# Patient Record
Sex: Female | Born: 1935 | Race: White | Hispanic: No | Smoking: Never smoker
Health system: Southern US, Community
[De-identification: ages and names within clinical notes are randomized; demographics above are authoritative.]

## PROBLEM LIST (undated history)

## (undated) DIAGNOSIS — K589 Irritable bowel syndrome without diarrhea: Secondary | ICD-10-CM

## (undated) DIAGNOSIS — I513 Intracardiac thrombosis, not elsewhere classified: Secondary | ICD-10-CM

## (undated) DIAGNOSIS — E0590 Thyrotoxicosis, unspecified without thyrotoxic crisis or storm: Secondary | ICD-10-CM

## (undated) DIAGNOSIS — I69391 Dysphagia following cerebral infarction: Secondary | ICD-10-CM

## (undated) DIAGNOSIS — E669 Obesity, unspecified: Secondary | ICD-10-CM

## (undated) DIAGNOSIS — K219 Gastro-esophageal reflux disease without esophagitis: Secondary | ICD-10-CM

## (undated) DIAGNOSIS — I82409 Acute embolism and thrombosis of unspecified deep veins of unspecified lower extremity: Secondary | ICD-10-CM

## (undated) DIAGNOSIS — F0390 Unspecified dementia without behavioral disturbance: Secondary | ICD-10-CM

## (undated) DIAGNOSIS — I639 Cerebral infarction, unspecified: Secondary | ICD-10-CM

## (undated) DIAGNOSIS — G4733 Obstructive sleep apnea (adult) (pediatric): Secondary | ICD-10-CM

## (undated) DIAGNOSIS — R4701 Aphasia: Secondary | ICD-10-CM

## (undated) DIAGNOSIS — I5022 Chronic systolic (congestive) heart failure: Secondary | ICD-10-CM

## (undated) DIAGNOSIS — I2510 Atherosclerotic heart disease of native coronary artery without angina pectoris: Secondary | ICD-10-CM

---

## 1997-11-24 ENCOUNTER — Other Ambulatory Visit: Admission: RE | Admit: 1997-11-24 | Discharge: 1997-11-24 | Payer: Self-pay | Admitting: Internal Medicine

## 1998-04-24 ENCOUNTER — Inpatient Hospital Stay (HOSPITAL_COMMUNITY): Admission: EM | Admit: 1998-04-24 | Discharge: 1998-04-24 | Payer: Self-pay | Admitting: Emergency Medicine

## 1998-12-09 ENCOUNTER — Ambulatory Visit (HOSPITAL_COMMUNITY): Admission: RE | Admit: 1998-12-09 | Discharge: 1998-12-09 | Payer: Self-pay | Admitting: *Deleted

## 1998-12-12 ENCOUNTER — Emergency Department (HOSPITAL_COMMUNITY): Admission: EM | Admit: 1998-12-12 | Discharge: 1998-12-12 | Payer: Self-pay | Admitting: *Deleted

## 1998-12-22 ENCOUNTER — Encounter: Payer: Self-pay | Admitting: Gastroenterology

## 1998-12-22 ENCOUNTER — Ambulatory Visit (HOSPITAL_COMMUNITY): Admission: RE | Admit: 1998-12-22 | Discharge: 1998-12-22 | Payer: Self-pay | Admitting: Gastroenterology

## 1998-12-23 ENCOUNTER — Emergency Department (HOSPITAL_COMMUNITY): Admission: EM | Admit: 1998-12-23 | Discharge: 1998-12-24 | Payer: Self-pay | Admitting: Emergency Medicine

## 1999-04-05 ENCOUNTER — Encounter: Payer: Self-pay | Admitting: Gynecology

## 1999-04-05 ENCOUNTER — Encounter: Admission: RE | Admit: 1999-04-05 | Discharge: 1999-04-05 | Payer: Self-pay | Admitting: Gynecology

## 1999-12-07 ENCOUNTER — Emergency Department (HOSPITAL_COMMUNITY): Admission: EM | Admit: 1999-12-07 | Discharge: 1999-12-07 | Payer: Self-pay | Admitting: Emergency Medicine

## 2000-04-05 ENCOUNTER — Encounter: Payer: Self-pay | Admitting: Gynecology

## 2000-04-05 ENCOUNTER — Encounter: Admission: RE | Admit: 2000-04-05 | Discharge: 2000-04-05 | Payer: Self-pay | Admitting: Gynecology

## 2000-09-25 ENCOUNTER — Emergency Department (HOSPITAL_COMMUNITY): Admission: EM | Admit: 2000-09-25 | Discharge: 2000-09-25 | Payer: Self-pay | Admitting: Emergency Medicine

## 2000-09-28 ENCOUNTER — Emergency Department (HOSPITAL_COMMUNITY): Admission: EM | Admit: 2000-09-28 | Discharge: 2000-09-28 | Payer: Self-pay | Admitting: Emergency Medicine

## 2000-12-03 ENCOUNTER — Encounter: Admission: RE | Admit: 2000-12-03 | Discharge: 2000-12-03 | Payer: Self-pay | Admitting: *Deleted

## 2001-01-18 ENCOUNTER — Emergency Department (HOSPITAL_COMMUNITY): Admission: EM | Admit: 2001-01-18 | Discharge: 2001-01-18 | Payer: Self-pay | Admitting: Emergency Medicine

## 2001-05-15 ENCOUNTER — Encounter: Admission: RE | Admit: 2001-05-15 | Discharge: 2001-05-15 | Payer: Self-pay | Admitting: *Deleted

## 2001-05-15 ENCOUNTER — Other Ambulatory Visit: Admission: RE | Admit: 2001-05-15 | Discharge: 2001-05-15 | Payer: Self-pay | Admitting: Gynecology

## 2001-07-07 ENCOUNTER — Inpatient Hospital Stay (HOSPITAL_COMMUNITY): Admission: EM | Admit: 2001-07-07 | Discharge: 2001-07-09 | Payer: Self-pay | Admitting: Emergency Medicine

## 2001-07-07 ENCOUNTER — Encounter: Payer: Self-pay | Admitting: Emergency Medicine

## 2002-01-28 ENCOUNTER — Emergency Department (HOSPITAL_COMMUNITY): Admission: EM | Admit: 2002-01-28 | Discharge: 2002-01-28 | Payer: Self-pay | Admitting: Emergency Medicine

## 2002-01-28 ENCOUNTER — Encounter: Payer: Self-pay | Admitting: Emergency Medicine

## 2002-01-29 ENCOUNTER — Encounter: Payer: Self-pay | Admitting: Gastroenterology

## 2002-01-29 ENCOUNTER — Ambulatory Visit (HOSPITAL_COMMUNITY): Admission: RE | Admit: 2002-01-29 | Discharge: 2002-01-29 | Payer: Self-pay | Admitting: Gastroenterology

## 2002-05-19 ENCOUNTER — Encounter: Payer: Self-pay | Admitting: Internal Medicine

## 2002-05-19 ENCOUNTER — Encounter: Admission: RE | Admit: 2002-05-19 | Discharge: 2002-05-19 | Payer: Self-pay | Admitting: Internal Medicine

## 2002-07-11 ENCOUNTER — Encounter: Admission: RE | Admit: 2002-07-11 | Discharge: 2002-07-11 | Payer: Self-pay | Admitting: Internal Medicine

## 2002-07-11 ENCOUNTER — Encounter: Payer: Self-pay | Admitting: Internal Medicine

## 2003-10-24 ENCOUNTER — Emergency Department (HOSPITAL_COMMUNITY): Admission: EM | Admit: 2003-10-24 | Discharge: 2003-10-24 | Payer: Self-pay | Admitting: Emergency Medicine

## 2003-12-15 ENCOUNTER — Other Ambulatory Visit: Admission: RE | Admit: 2003-12-15 | Discharge: 2003-12-15 | Payer: Self-pay | Admitting: Gynecology

## 2004-02-09 DIAGNOSIS — K2980 Duodenitis without bleeding: Secondary | ICD-10-CM | POA: Insufficient documentation

## 2004-02-12 ENCOUNTER — Ambulatory Visit: Payer: Self-pay | Admitting: Gastroenterology

## 2004-02-15 ENCOUNTER — Ambulatory Visit: Payer: Self-pay | Admitting: Gastroenterology

## 2004-03-07 ENCOUNTER — Ambulatory Visit: Payer: Self-pay | Admitting: Internal Medicine

## 2004-03-08 ENCOUNTER — Ambulatory Visit (HOSPITAL_COMMUNITY): Admission: RE | Admit: 2004-03-08 | Discharge: 2004-03-08 | Payer: Self-pay | Admitting: Internal Medicine

## 2004-03-17 ENCOUNTER — Inpatient Hospital Stay (HOSPITAL_COMMUNITY): Admission: AD | Admit: 2004-03-17 | Discharge: 2004-03-18 | Payer: Self-pay | Admitting: Internal Medicine

## 2004-03-17 ENCOUNTER — Ambulatory Visit: Payer: Self-pay | Admitting: Internal Medicine

## 2004-03-19 ENCOUNTER — Inpatient Hospital Stay (HOSPITAL_COMMUNITY): Admission: EM | Admit: 2004-03-19 | Discharge: 2004-03-23 | Payer: Self-pay | Admitting: Emergency Medicine

## 2004-03-19 ENCOUNTER — Ambulatory Visit: Payer: Self-pay | Admitting: Internal Medicine

## 2004-03-23 ENCOUNTER — Inpatient Hospital Stay: Admission: RE | Admit: 2004-03-23 | Discharge: 2004-03-30 | Payer: Self-pay | Admitting: Internal Medicine

## 2004-03-23 ENCOUNTER — Ambulatory Visit: Payer: Self-pay | Admitting: Internal Medicine

## 2004-03-29 ENCOUNTER — Ambulatory Visit (HOSPITAL_COMMUNITY): Admission: RE | Admit: 2004-03-29 | Discharge: 2004-03-29 | Payer: Self-pay | Admitting: Internal Medicine

## 2004-04-18 ENCOUNTER — Ambulatory Visit: Payer: Self-pay | Admitting: Internal Medicine

## 2004-05-02 ENCOUNTER — Ambulatory Visit: Payer: Self-pay | Admitting: Internal Medicine

## 2004-06-22 ENCOUNTER — Ambulatory Visit: Payer: Self-pay | Admitting: Internal Medicine

## 2004-06-27 ENCOUNTER — Other Ambulatory Visit: Admission: RE | Admit: 2004-06-27 | Discharge: 2004-06-27 | Payer: Self-pay | Admitting: Obstetrics and Gynecology

## 2004-06-30 ENCOUNTER — Ambulatory Visit: Payer: Self-pay | Admitting: Internal Medicine

## 2004-08-24 ENCOUNTER — Ambulatory Visit: Payer: Self-pay | Admitting: Internal Medicine

## 2004-12-28 ENCOUNTER — Ambulatory Visit: Payer: Self-pay | Admitting: Internal Medicine

## 2005-01-03 ENCOUNTER — Other Ambulatory Visit: Admission: RE | Admit: 2005-01-03 | Discharge: 2005-01-03 | Payer: Self-pay | Admitting: Internal Medicine

## 2005-01-03 ENCOUNTER — Ambulatory Visit: Payer: Self-pay | Admitting: Internal Medicine

## 2005-01-03 ENCOUNTER — Encounter: Payer: Self-pay | Admitting: Internal Medicine

## 2005-01-16 ENCOUNTER — Ambulatory Visit: Payer: Self-pay | Admitting: Gastroenterology

## 2005-02-15 ENCOUNTER — Emergency Department (HOSPITAL_COMMUNITY): Admission: EM | Admit: 2005-02-15 | Discharge: 2005-02-15 | Payer: Self-pay | Admitting: Emergency Medicine

## 2005-02-16 ENCOUNTER — Ambulatory Visit: Payer: Self-pay | Admitting: Internal Medicine

## 2005-03-20 ENCOUNTER — Ambulatory Visit: Payer: Self-pay | Admitting: Internal Medicine

## 2005-08-25 ENCOUNTER — Ambulatory Visit: Payer: Self-pay | Admitting: Internal Medicine

## 2005-11-10 ENCOUNTER — Ambulatory Visit: Payer: Self-pay | Admitting: Internal Medicine

## 2005-11-28 ENCOUNTER — Ambulatory Visit: Payer: Self-pay | Admitting: Internal Medicine

## 2005-12-21 ENCOUNTER — Emergency Department (HOSPITAL_COMMUNITY): Admission: EM | Admit: 2005-12-21 | Discharge: 2005-12-21 | Payer: Self-pay | Admitting: Emergency Medicine

## 2006-02-19 ENCOUNTER — Emergency Department (HOSPITAL_COMMUNITY): Admission: EM | Admit: 2006-02-19 | Discharge: 2006-02-19 | Payer: Self-pay | Admitting: Emergency Medicine

## 2006-02-27 ENCOUNTER — Ambulatory Visit: Payer: Self-pay | Admitting: Internal Medicine

## 2006-02-27 LAB — CONVERTED CEMR LAB
Bacteria, U Microscopic: NEGATIVE /hpf
Bilirubin Urine: NEGATIVE
Cortisol, Plasma: 11.7 ug/dL
Hemoglobin, Urine: NEGATIVE
Ketones, ur: NEGATIVE mg/dL
Nitrite: NEGATIVE
Specific Gravity, Urine: 1.025 (ref 1.000–1.03)
Urobilinogen, UA: 0.2 (ref 0.0–1.0)
Vitamin B-12: 196 pg/mL — ABNORMAL LOW (ref 211–911)
pH: 6 (ref 5.0–8.0)

## 2006-03-29 ENCOUNTER — Ambulatory Visit: Payer: Self-pay | Admitting: Internal Medicine

## 2006-03-29 LAB — CONVERTED CEMR LAB
Bilirubin Urine: NEGATIVE
Hemoglobin, Urine: NEGATIVE
Ketones, ur: NEGATIVE mg/dL
Mucus, UA: NEGATIVE
Nitrite: NEGATIVE
Total Protein, Urine: NEGATIVE mg/dL
Urine Glucose: NEGATIVE mg/dL
Urobilinogen, UA: 0.2 (ref 0.0–1.0)

## 2006-04-27 ENCOUNTER — Ambulatory Visit: Payer: Self-pay | Admitting: Internal Medicine

## 2006-04-27 LAB — CONVERTED CEMR LAB
BUN: 14 mg/dL (ref 6–23)
Bilirubin Urine: NEGATIVE
Creatinine, Ser: 0.9 mg/dL (ref 0.4–1.2)
Specific Gravity, Urine: 1.025 (ref 1.000–1.03)
Total CHOL/HDL Ratio: 4.2
VLDL: 26 mg/dL (ref 0–40)
pH: 6 (ref 5.0–8.0)

## 2006-05-03 ENCOUNTER — Ambulatory Visit: Payer: Self-pay | Admitting: Internal Medicine

## 2006-05-03 LAB — CONVERTED CEMR LAB
Bacteria, UA: NEGATIVE
Bilirubin Urine: NEGATIVE
Specific Gravity, Urine: >=1.03 (ref 1.000–1.03)
Total Protein, Urine: NEGATIVE mg/dL
Urine Glucose: NEGATIVE mg/dL
Urobilinogen, UA: 0.2 (ref 0.0–1.0)
pH: 5.5 (ref 5.0–8.0)

## 2006-05-10 ENCOUNTER — Ambulatory Visit: Payer: Self-pay | Admitting: Internal Medicine

## 2006-05-30 ENCOUNTER — Ambulatory Visit: Payer: Self-pay | Admitting: Internal Medicine

## 2006-05-31 ENCOUNTER — Ambulatory Visit: Payer: Self-pay | Admitting: Internal Medicine

## 2006-07-28 ENCOUNTER — Ambulatory Visit: Payer: Self-pay | Admitting: Family Medicine

## 2006-10-27 ENCOUNTER — Ambulatory Visit: Payer: Self-pay | Admitting: Internal Medicine

## 2006-11-03 ENCOUNTER — Encounter: Payer: Self-pay | Admitting: Internal Medicine

## 2006-11-03 DIAGNOSIS — I959 Hypotension, unspecified: Secondary | ICD-10-CM

## 2006-11-03 DIAGNOSIS — I2510 Atherosclerotic heart disease of native coronary artery without angina pectoris: Secondary | ICD-10-CM | POA: Insufficient documentation

## 2006-11-03 DIAGNOSIS — R269 Unspecified abnormalities of gait and mobility: Secondary | ICD-10-CM

## 2006-11-03 DIAGNOSIS — R413 Other amnesia: Secondary | ICD-10-CM

## 2007-01-30 ENCOUNTER — Ambulatory Visit: Payer: Self-pay | Admitting: Internal Medicine

## 2007-09-28 ENCOUNTER — Ambulatory Visit: Payer: Self-pay | Admitting: Internal Medicine

## 2007-09-28 DIAGNOSIS — N3090 Cystitis, unspecified without hematuria: Secondary | ICD-10-CM | POA: Insufficient documentation

## 2008-01-20 ENCOUNTER — Inpatient Hospital Stay (HOSPITAL_COMMUNITY): Admission: EM | Admit: 2008-01-20 | Discharge: 2008-01-22 | Payer: Self-pay | Admitting: Emergency Medicine

## 2008-01-20 ENCOUNTER — Ambulatory Visit: Payer: Self-pay | Admitting: Internal Medicine

## 2008-02-08 ENCOUNTER — Observation Stay (HOSPITAL_COMMUNITY): Admission: EM | Admit: 2008-02-08 | Discharge: 2008-02-10 | Payer: Self-pay | Admitting: Emergency Medicine

## 2008-02-08 ENCOUNTER — Ambulatory Visit: Payer: Self-pay | Admitting: Cardiology

## 2008-02-10 ENCOUNTER — Encounter: Payer: Self-pay | Admitting: Internal Medicine

## 2008-02-19 ENCOUNTER — Ambulatory Visit: Payer: Self-pay | Admitting: Internal Medicine

## 2008-02-19 DIAGNOSIS — F068 Other specified mental disorders due to known physiological condition: Secondary | ICD-10-CM | POA: Insufficient documentation

## 2008-02-19 DIAGNOSIS — R42 Dizziness and giddiness: Secondary | ICD-10-CM | POA: Insufficient documentation

## 2008-02-19 DIAGNOSIS — F411 Generalized anxiety disorder: Secondary | ICD-10-CM | POA: Insufficient documentation

## 2008-02-24 LAB — CONVERTED CEMR LAB
Bilirubin Urine: NEGATIVE
Crystals: NEGATIVE
Ketones, ur: NEGATIVE mg/dL
Specific Gravity, Urine: 1.015 (ref 1.000–1.03)
Total Protein, Urine: NEGATIVE mg/dL
Urine Glucose: NEGATIVE mg/dL

## 2008-03-23 ENCOUNTER — Ambulatory Visit: Payer: Self-pay | Admitting: Internal Medicine

## 2008-03-25 LAB — CONVERTED CEMR LAB
Ketones, ur: NEGATIVE mg/dL
Mucus, UA: NEGATIVE
Nitrite: NEGATIVE
Specific Gravity, Urine: 1.025 (ref 1.000–1.03)

## 2009-03-01 ENCOUNTER — Telehealth: Payer: Self-pay | Admitting: Internal Medicine

## 2009-03-08 ENCOUNTER — Encounter: Payer: Self-pay | Admitting: Internal Medicine

## 2009-04-29 ENCOUNTER — Emergency Department (HOSPITAL_COMMUNITY): Admission: EM | Admit: 2009-04-29 | Discharge: 2009-04-29 | Payer: Self-pay | Admitting: Emergency Medicine

## 2009-09-23 ENCOUNTER — Encounter (HOSPITAL_COMMUNITY): Admission: RE | Admit: 2009-09-23 | Discharge: 2009-09-24 | Payer: Self-pay | Admitting: Endocrinology

## 2010-05-13 ENCOUNTER — Other Ambulatory Visit (HOSPITAL_COMMUNITY): Payer: Self-pay | Admitting: Endocrinology

## 2010-05-13 DIAGNOSIS — E0590 Thyrotoxicosis, unspecified without thyrotoxic crisis or storm: Secondary | ICD-10-CM

## 2010-06-06 ENCOUNTER — Inpatient Hospital Stay (HOSPITAL_COMMUNITY): Admission: RE | Admit: 2010-06-06 | Payer: Self-pay | Source: Ambulatory Visit

## 2010-06-07 ENCOUNTER — Encounter (HOSPITAL_COMMUNITY)
Admission: RE | Admit: 2010-06-07 | Discharge: 2010-06-07 | Disposition: A | Discharge status: Home / Self Care | Payer: MEDICARE | Source: Ambulatory Visit | Attending: Endocrinology | Admitting: Endocrinology

## 2010-06-07 ENCOUNTER — Encounter (HOSPITAL_COMMUNITY): Payer: Self-pay

## 2010-06-07 DIAGNOSIS — E0590 Thyrotoxicosis, unspecified without thyrotoxic crisis or storm: Secondary | ICD-10-CM | POA: Insufficient documentation

## 2010-06-08 ENCOUNTER — Ambulatory Visit (HOSPITAL_COMMUNITY)
Admission: RE | Admit: 2010-06-08 | Discharge: 2010-06-08 | Disposition: A | Discharge status: Home / Self Care | Payer: Self-pay | Source: Ambulatory Visit | Attending: Endocrinology | Admitting: Endocrinology

## 2010-06-08 ENCOUNTER — Encounter (HOSPITAL_COMMUNITY): Payer: Self-pay

## 2010-06-08 DIAGNOSIS — E0590 Thyrotoxicosis, unspecified without thyrotoxic crisis or storm: Secondary | ICD-10-CM | POA: Insufficient documentation

## 2010-06-08 HISTORY — DX: Thyrotoxicosis, unspecified without thyrotoxic crisis or storm: E05.90

## 2010-06-08 MED ORDER — SODIUM IODIDE I 131 CAPSULE
4.5000 | Freq: Once | INTRAVENOUS | Status: AC | PRN
  Administered 2010-06-07: 4.5 via ORAL

## 2010-06-10 ENCOUNTER — Other Ambulatory Visit (HOSPITAL_COMMUNITY): Payer: Self-pay | Admitting: Endocrinology

## 2010-06-10 DIAGNOSIS — E0590 Thyrotoxicosis, unspecified without thyrotoxic crisis or storm: Secondary | ICD-10-CM

## 2010-06-14 ENCOUNTER — Emergency Department (HOSPITAL_COMMUNITY)
Admission: EM | Admit: 2010-06-14 | Discharge: 2010-06-14 | Discharge status: Against Medical Advice | Payer: Medicare Other | Attending: Emergency Medicine | Admitting: Emergency Medicine

## 2010-06-14 DIAGNOSIS — R109 Unspecified abdominal pain: Secondary | ICD-10-CM | POA: Insufficient documentation

## 2010-06-22 ENCOUNTER — Inpatient Hospital Stay (HOSPITAL_COMMUNITY): Admission: RE | Admit: 2010-06-22 | Payer: Self-pay | Source: Ambulatory Visit

## 2010-06-22 ENCOUNTER — Ambulatory Visit (HOSPITAL_COMMUNITY): Payer: Self-pay

## 2010-06-23 ENCOUNTER — Other Ambulatory Visit (HOSPITAL_COMMUNITY): Payer: Self-pay

## 2010-06-26 LAB — URINALYSIS, ROUTINE W REFLEX MICROSCOPIC
Bilirubin Urine: NEGATIVE
Glucose, UA: 100 mg/dL — AB
Ketones, ur: 40 mg/dL — AB
Leukocytes, UA: NEGATIVE
Nitrite: NEGATIVE
Protein, ur: NEGATIVE mg/dL
Specific Gravity, Urine: 1.028 (ref 1.005–1.030)
Urobilinogen, UA: 0.2 mg/dL (ref 0.0–1.0)
pH: 5.5 (ref 5.0–8.0)

## 2010-06-26 LAB — BASIC METABOLIC PANEL
BUN: 21 mg/dL (ref 6–23)
Calcium: 9 mg/dL (ref 8.4–10.5)
Creatinine, Ser: 0.76 mg/dL (ref 0.4–1.2)
GFR calc Af Amer: >60 mL/min (ref >60)
GFR calc non Af Amer: >60 mL/min (ref >60)
Glucose, Bld: 173 mg/dL — ABNORMAL HIGH (ref 70–99)
Potassium: 3.9 mEq/L (ref 3.5–5.1)
Sodium: 137 mEq/L (ref 135–145)

## 2010-06-26 LAB — CBC
HCT: 37.2 % (ref 36.0–46.0)
Hemoglobin: 12.3 g/dL (ref 12.0–15.0)
MCHC: 33.1 g/dL (ref 30.0–36.0)
MCV: 85.5 fL (ref 78.0–100.0)
Platelets: 207 10*3/uL (ref 150–400)
RBC: 4.35 MIL/uL (ref 3.87–5.11)
RDW: 13.7 % (ref 11.5–15.5)
WBC: 5.9 10*3/uL (ref 4.0–10.5)

## 2010-06-26 LAB — DIFFERENTIAL
Basophils Absolute: 0 10*3/uL (ref 0.0–0.1)
Basophils Relative: 0 % (ref 0–1)
Eosinophils Absolute: 0.1 10*3/uL (ref 0.0–0.7)
Eosinophils Relative: 1 % (ref 0–5)
Lymphocytes Relative: 22 % (ref 12–46)
Lymphs Abs: 1.3 10*3/uL (ref 0.7–4.0)
Monocytes Absolute: 0.3 K/uL (ref 0.1–1.0)
Monocytes Relative: 5 % (ref 3–12)
Neutro Abs: 4.2 10*3/uL (ref 1.7–7.7)
Neutrophils Relative %: 72 % (ref 43–77)

## 2010-06-26 LAB — TROPONIN I: Troponin I: 0.04 ng/mL (ref 0.00–0.06)

## 2010-06-26 LAB — CK TOTAL AND CKMB (NOT AT ARMC)
CK, MB: 0.9 ng/mL (ref 0.3–4.0)
Relative Index: INVALID (ref 0.0–2.5)
Total CK: 62 U/L (ref 7–177)

## 2010-06-26 LAB — BASIC METABOLIC PANEL WITH GFR
CO2: 24 meq/L (ref 19–32)
Chloride: 103 meq/L (ref 96–112)

## 2010-06-26 LAB — URINE MICROSCOPIC-ADD ON

## 2010-06-26 LAB — POCT CARDIAC MARKERS: Myoglobin, poc: 81.5 ng/mL (ref 12–200)

## 2010-08-12 ENCOUNTER — Encounter (HOSPITAL_COMMUNITY)
Admission: RE | Admit: 2010-08-12 | Discharge: 2010-08-12 | Disposition: A | Discharge status: Home / Self Care | Payer: Medicare Other | Source: Ambulatory Visit | Attending: Endocrinology | Admitting: Endocrinology

## 2010-08-12 DIAGNOSIS — E0590 Thyrotoxicosis, unspecified without thyrotoxic crisis or storm: Secondary | ICD-10-CM

## 2010-08-12 MED ORDER — SODIUM IODIDE I 131 CAPSULE
19.7000 | Freq: Once | INTRAVENOUS | Status: AC | PRN
  Administered 2010-08-12: 19.7 via ORAL

## 2010-08-23 NOTE — H&P (Signed)
Jenny Brady, Jenny Brady               ACCOUNT NO.:  [id number]   MEDICAL RECORD NO.:  [id number]          PATIENT TYPE:  OBV   LOCATION:  1412                         FACILITY:  [facility]   PHYSICIAN:  Darryl D. Prime, MD    DATE OF BIRTH:  1925-04-15   DATE OF ADMISSION:  02/08/2008  DATE OF DISCHARGE:                              HISTORY & PHYSICAL   CODE STATUS:  Full code.   PRIMARY CARE PHYSICIAN:  Georgina Quint. Plotnikov, MD   CHIEF COMPLAINT:  The patient was the historian.  She is a good  historian.  Her chief complaint is dizziness.   HISTORY OF PRESENT ILLNESS:  Jenny Brady is an 77 year old female with  a history of chronic gait disorder, history of vertigo, likely benign  positional vertigo, history of mild dementia, history of hypertension,  history of possible coronary artery disease who presents with dizziness.  She notes she has had intermittent dizzy spells for years and is not  sure of what exacerbates them.  She had one episode tonight and  presented to the emergency room.  She notes that she had mild dizziness  with sitting up still while during the interview.  She denies any chest  pain, shortness of breath or abdominal pain.  She denies any dysuria or  fever.  She denies any black stools or bloody stools.  In the emergency  room she denies any worsening or progression of disease itself.  In the  emergency room she was given aspirin and nitro paste.   PAST MEDICAL AND SURGICAL HISTORY:  As above.  It includes hypertension.  The patient notes she has had stents in the past at [facility] although there are other notes that note she did not have these  stents placed.   MEDICATIONS:  She notes she is taking no medications at this time.  Discharge medicines notes aspirin 81 mg daily and a multivitamin daily.   ALLERGIES:  She is allergic to CONTRAST MEDIA, SULFA, CODEINE, PREVACID.   SOCIAL HISTORY:  No tobacco, alcohol or drug use.   FAMILY HISTORY:  Is  positive for coronary artery disease in the family.   REVIEW OF SYSTEMS:  A 14 point review of systems negative unless stated  above.   PHYSICAL EXAMINATION:  VITAL SIGNS:  Temperature is 97.5 with a pulse of  65, respiratory rate of 18, blood pressure 152/94, sats 98% on room air.  Blood pressure on standing was 116/70 with a pulse of 72 significantly  orthostatic.  HEENT:  Normocephalic, atraumatic.  Pupils equal, round and reactive to  light.  Extraocular movements being intact.  The patient's oropharynx is  dry.  NECK:  Supple with no lymphadenopathy or thyromegaly.  No carotid  bruits.  LUNGS:  Clear to auscultation bilaterally.  CARDIOVASCULAR:  Regular rhythm and rate with no murmurs, rubs or  gallops.  Normal S1 and S2.  No S3 or S4.  ABDOMEN:  Soft, nontender, nondistended with no hepatosplenomegaly.  EXTREMITIES:  Show no clubbing, cyanosis.  She has 2+ lower extremity  edema.  NEUROLOGICAL:  She is alert  and oriented x4 with cranial nerves II-XII  grossly intact.  Strength and sensation grossly intact.   LABS:  Show sodium of 141 with a potassium of 3.6, chloride 106, bicarb  26, BUN 17, creatinine 0.8, glucose of 155.  Otherwise normal LFTs.  The  patient's cardiac markers at 2213 hours were negative.  White count 7.3  with hemoglobin of 13, hematocrit 39.5, platelets 272, segs of 72%, INR  1, PT 13.2.  EKG showed normal sinus rhythm with 69 beats per minute,  left ventricular hypertrophy.  Chest x-ray showed no cardiopulmonary  disease.  CT of the head negative.  On January 18, 2008, CT head again  negative tonight.   ASSESSMENT AND PLAN:  This is a patient with a history of vertigo who  now presents with vertigo.  She describes her dizzy spells as the room  moving around her, worse when going from a sitting to a standing  position, recent admission for similar symptoms, who is also now  orthostatic.  She will be admitted for vertigo, dizziness, rule out  acute  coronary syndrome.  She is likely significantly hypovolemic.  We  will give her IV fluids and will get cardiac markers x3.  She will be on  telemetry and will give her aspirin.  She will be ruled out for acute  coronary syndrome because the ER physician noted that the patient's son  heard a history from the patient of chest pain tonight.      Darryl D. Prime, MD  Electronically Signed     DDP/MEDQ  D:  02/09/2008  T:  02/09/2008  Job:  [id number]

## 2010-08-23 NOTE — Discharge Summary (Signed)
NAMELYLAH, LANTIS               ACCOUNT NO.:  [id number]   MEDICAL RECORD NO.:  [id number]          PATIENT TYPE:  INP   LOCATION:  1442                         FACILITY:  [facility]   PHYSICIAN:  Valerie A. Felicity Coyer, MDDATE OF BIRTH:  1925-12-12   DATE OF ADMISSION:  01/20/2008  DATE OF DISCHARGE:  01/22/2008                               DISCHARGE SUMMARY   PRIMARY CARE PHYSICIAN:  Georgina Quint. Plotnikov, MD   DISCHARGE DIAGNOSES:  1. Vertigo thought benign positional in nature.  2. Mild dementia.   HISTORY OF PRESENT ILLNESS:  Ms. Dembeck is an 77 year old [place] female  with past medical history of mild dementia, chronic gait disorder,  hypertension and CAD who presented to [facility] emergency room on the  day of admission with complaints of world spinning, constant since day  prior to admission.  The patient denied any sinus or ear congestion,  denied any focal weakness.  The patient did report positive history of  similar attacks.  She denied any chest pain, shortness of breath.  No  palpitations, syncope or near syncope.  The patient did have a fall on  the morning of admission en route to the bathroom at which time she was  unable to get up independently therefore called EMS for transfer to the  emergency room.  Upon admission evaluation in emergency room the  patient's symptoms had resolved.  However, the patient felt fearful of  falling again therefore was admitted for further evaluation and  treatment.   PAST MEDICAL HISTORY:  1. Mild dementia.  2. Chronic gait disorder.  3. History of hypertension on no meds prior to arrival.  4. History of CAD with questionable history of stent placement per the      patient.   HOSPITAL COURSE:  Vertigo.  Again, the patient's symptoms were thought  likely secondary to benign positional vertigo.  The patient also with  previous episodes prior to this event.  The patient without any  nystagmus or neuro deficits on exam.  The patient  was admitted overnight  on telemetry without any event.  CT of the head was obtained which was  negative for any acute intracranial findings.  The patient has refused  medical management with Valium and meclizine at this time despite  education otherwise.  The patient was seen in consultation by physical  therapy who felt the patient might benefit from followup with vestibular  rehab at [facility] outpatient clinic.  I have discussed the benefits of  this rehab program in depth with the patient who is agreeable to trying.  At time of dictation the patient denies any vertigo symptoms and again  is still refusing medication despite education otherwise.  The patient  has been instructed to call rehab center to schedule initial exam as she  does not want the appointment scheduled for her at time of discharge.   DISCHARGE MEDICATIONS:  1. Aspirin 81 mg p.o. daily.  2. Multivitamin p.o. daily.  3. The patient with multiple medications listed in the [facility] per primary      care physician.  However, states prior  to this admission only      taking a multivitamin daily.   PERTINENT LAB WORK:  At time of discharge white cell count 4.5, platelet  count 250, hemoglobin 12.5, hematocrit 38.2, sodium 141, potassium 3.8,  BUN 14, creatinine 0.72.   DISPOSITION:  The patient felt medically stable for discharge at this  time as symptoms have resolved and again thought secondary to benign  positional vertigo.  The patient is instructed to follow up with her  primary care physician or establish a new primary care physician in the  next 1-2 weeks post discharge.  In addition, the patient instructed to  call [facility] outpatient rehab center at [phone] to schedule an appointment  for vestibular rehab.      Cordelia Pen, NP      Raenette Rover. Felicity Coyer, MD  Electronically Signed    LE/MEDQ  D:  01/22/2008  T:  01/22/2008  Job:  [id number]   cc:   Georgina Quint. Plotnikov, MD  [address]   [place]  [place] [address]   [facility]

## 2010-08-26 NOTE — Discharge Summary (Signed)
NAMEMADINA, [name]:  [id number]   MEDICAL RECORD NO.:  [id number]          PATIENT TYPE:  ORB   LOCATION:  4530                         FACILITY:  [facility]   PHYSICIAN:  Rene Paci, M.D. LHCDATE OF BIRTH:  1925-08-13   DATE OF ADMISSION:  03/23/2004  DATE OF DISCHARGE:  03/29/2004                                 DISCHARGE SUMMARY   DISCHARGE DIAGNOSES:  1.  Debilitation.  2.  Right calcaneal spurs.  3.  Urinary tract infection.  4.  Functional abdominal pain.   BRIEF ADMISSION HISTORY:  [name] is a 77 year old white female.  She  was initially admitted March 17, 2004 and discharged March 18, 2004 with  an  unremarkable workup for abdominal pain.  She returned on March 19, 2004 stating she was unable to ambulate secondary to lower extremity  weakness.  She also complained of increased falls.  She was readmitted for  further evaluation of her lumbar spine and for PT/OT evaluation.  She had an  MRI of her lumbar spine that showed degenerative disk disease, but was  negative for any nerve rate compression, foraminal stenosis, insufficiency  fractures or compression fractures.  The patient has had an MRI of brain in  November 2005 which was also unremarkable for any cerebrovascular accident.  The patient was evaluated by physical therapy and occupational therapy and  they felt she would benefit from subacute rehab prior to the discharge home.  The patient was transferred to the subacute rehab unit.   PAST MEDICAL HISTORY:  1.  History of coronary artery disease status post prior stent.  2.  Dyslipidemia.  3.  Hypertension.  4.  History of lower extremity DVT after trauma and was on Coumadin in the      past.   HOSPITAL COURSE:  1.  Debilitation.  She is improved to the point she is stable for discharge.  2.  Right foot pain.  The patient did complain of some right foot pain.      Plain films were ordered revealing some right calcaneal  spurs.  3.  GI:  The patient complained of recurrent nausea, vomiting, and abdominal      pain.  The patient had an ongoing outpatient GI evaluation.  We did ask      for GI to see them in the hospital.  The patient was seen by Dr. Juanda Chance      who suspected that this was most likely functional.  She felt she might      benefit from a psychiatric agent versus sedative, versus anti-spasmodic.      An upper GI series was obtained and was unremarkable without reflux.      There was some mild tertiary contractions in the distal esophagus.  She      also had an abdominal ultrasound which was also unremarkable.  Gastric      emptying scan to rule out gastroparesis is still pending.  Currently,      her Celebrex has been discontinued.  She was then started on Librax.      They are  currently considering Zelnorm as well.  4.  ID:  The patient had an abnormal urinalysis on March 26, 2004.      Therefore, we will treat for 7 days with Cipro.   DISCHARGE MEDICATIONS:  1.  Lopressor 25 mg daily.  2.  Aspirin 325 mg daily.  3.  Aciphex 20 mg daily.  4.  Librax one in the morning and then q.8h p.r.n.  5.  Simethicone 80 mg b.i.d.  6.  Cipro 500 mg b.i.d. for 7 days.   Follow up with Dr. Posey Rea Monday,  January 9 at 2:30 p.m.      Laur   LC/MEDQ  D:  03/29/2004  T:  03/30/2004  Job:  [id number]   cc:   Georgina Quint. Plotnikov, M.D. [facility]   Teena Irani. Arlyce Dice, M.D.  [address]  [place]  [place] [address]  Fax: [phone]

## 2010-08-26 NOTE — Discharge Summary (Signed)
Jenny Brady, Jenny Brady               ACCOUNT NO.:  [id number]   MEDICAL RECORD NO.:  [id number]          PATIENT TYPE:  INP   LOCATION:  3006                         FACILITY:  [facility]   PHYSICIAN:  Rene Paci, M.D. LHCDATE OF BIRTH:  Aug 10, 1925   DATE OF ADMISSION:  03/19/2004  DATE OF DISCHARGE:  03/23/2004                                 DISCHARGE SUMMARY   DISCHARGE DIAGNOSES:  1.  Weakness and debilitation.  2.  Lower extremity weakness.  3.  Chest wall pain.  4.  Costochondritis.  5.  Lumbar disc disease.   HISTORY OF PRESENT ILLNESS:  Jenny Brady is a 77 year old, white female who  was admitted on March 19, 2004.  She had been initially hospitalized on  March 17, 2004.  At that time, she was admitted with abdominal pain and  discomfort with a question of diverticulitis versus neuropathy versus  pyelonephritis.  Her workup including CBC, CMET, lipase, sedimentation rate,  CT of the abdomen and pelvis were unremarkable.  The patient was felt to be  stable for discharge home and she was discharged on March 18, 2004.  Despite her unremarkable workup, the patient was uncomfortable during the  night.  She stated she was too weak to sit up or get out of bed.  She  continued to have pain that she described initiated in her abdomen and  radiating around to her back.  The patient also states she has had  difficulty moving her legs and has not had increased falls.  It was noted  that Dr. Posey Rea had ordered an MRI of the brain in November 2005, which  showed small vessel disease, but no other abnormalities.  The patient was  admitted for further evaluation.   PAST MEDICAL HISTORY:  1.  Coronary artery disease with a history of stent placement.  2.  Hypertension.  3.  Gastroesophageal reflux disease.   HOSPITAL COURSE:  Problem 1.  WEAKNESS WITH FALLS ASSOCIATED WITH LOWER  EXTREMITY WEAKNESS:  This prompted an MRI of her lumbar spine.  This showed  some degenerative  disc disease, however, it was negative for any nerve root  compression, foraminal stenosis, insufficiency fractures or compression  fractures.  There was nothing to account for the lower extremity weakness.  The patient was evaluated by PT and OT.  They both felt that she would  benefit from subacute rehabilitation prior to being discharged home.  The  patient was reluctant, but agreeable to subacute rehabilitation.   Problem 2.  CARDIOVASCULAR:  As noted, the patient has a history of coronary  disease and a remote history of stent.  The patient did have some complaints  of chest pain during this admission.  Serial cardiac enzymes were negative  for ischemia.  EKG was also negative for ischemia.  The patient did,  however, have some reproducible chest pain to palpation.  We suspect she may  have some underlying chest wall pain or costochondritis, although her  sedimentation rate was normal.  She did receive one dose of Solu-Medrol with  some improvement in her pain and she has also been  started on some Celebrex  for now.   Problem 3.  HYPERTENSION:  This has remained stable.   Problem 4.  GASTROINTESTINAL:  The patient has a history of gastroesophageal  reflux disease.  As noted, she had a CT of the abdomen and pelvis on  March 18, 2004, that was unremarkable.   DISPOSITION:  The patient will be transferred to the subacute rehabilitation  unit.   DISCHARGE LABORATORY DATA AND X-RAY FINDINGS:  A 24-hour urine for heavy  metals is still pending.  LFTs are normal.  Potassium was 3.4 on admission,  BUN 14, creatinine 0.8.  CBC was normal.  B12 308.  RPR nonreactive.   DISCHARGE MEDICATIONS:  1.  Lopressor 25 mg q.d.  2.  Aspirin 325 mg q.d.  3.  Potassium 10 mEq twice a day.  4.  Protonix 40 mg b.i.d.  5.  Celebrex 100 mg b.i.d.  6.  Restoril 15 mg h.s. p.r.n.  7.  Ultram 50-100 mg q.6h. p.r.n.   MEDICATIONS ON ADMISSION:  1.  Metoprolol 25 mg q.d.  2.  Aciphex 20 mg q.d.    FOLLOW UP:  She will follow up with Dr. Posey Rea on discharge from subacute  rehabilitation.      Laur   LC/MEDQ  D:  03/23/2004  T:  03/23/2004  Job:  [id number]   cc:   Georgina Quint. Plotnikov, M.D. [facility]

## 2010-08-26 NOTE — H&P (Signed)
NAMEMAEBY, VANKLEECK               ACCOUNT NO.:  [id number]   MEDICAL RECORD NO.:  [id number]          PATIENT TYPE:  INP   LOCATION:                               FACILITY:  [facility]   PHYSICIAN:  Georgina Quint. Plotnikov, M.D. Romualdo Bolk OF BIRTH:  [birth year]   DATE OF ADMISSION:  03/17/2004  DATE OF DISCHARGE:                                HISTORY & PHYSICAL   CHIEF COMPLAINT:  Severe pain in the right flank going into the right side  of the abdomen, pain in the left abdomen, difficulty walking, diarrhea.   HISTORY OF PRESENT ILLNESS:  The patient is a 77 year old female who  presents with the above problem of one week duration that has been getting  progressively worse.  She was worked into my schedule today.  She says that  the pain originates in the back, mostly right flank, goes around.  It is  worse with movement, meals.  She slept off and on last night.  Rates pain 10  out of 10 in intensity.  She dose have some pain on the left side as well  that goes around the back.  No nausea or vomiting.  Several loose stools  without blood or mucus.   PAST MEDICAL HISTORY:  1.  Coronary artery disease, history of stent placement.  2.  Hypertension.   ALLERGIES:  IV CONTRAST (she was able to tolerate IV contrast with  premedication with Benadryl in the past), SEPTRA, and CODEINE.   CURRENT MEDICATIONS:  1.  Lipitor that she is not taking.  2.  Metoprolol 25 mg q.d.  3.  AcipHex 20 mg q.d.   SOCIAL HISTORY:  She does not smoke and drinks.  She is married.   FAMILY HISTORY:  Both parents are deceased.  There was coronary artery  disease in the family.   REVIEW OF SYMPTOMS:  As above.  Some nausea without vomiting.  No skin rash.  No chest pain or shortness of breath.  No syncope and no neurologic  complaints.  The rest is negative or as above.   PHYSICAL EXAMINATION:  VITAL SIGNS:  Blood pressure 142/100, pulse 76,  temperature 97.8.  Weight 179 pounds.  GENERAL:  She is in no acute  distress.  She is anxious.  HEENT:  Moist mucosa.  Pupils reactive.  NECK:  Supple.  No meningeal signs.  LUNGS:  Clear.  No wheezes or rales.  HEART:  S1 and S2.  No gallop and no murmur.  MUSCULOSKELETAL:  Lumbosacral spine without deformities.  Tender in the  right costovertebral angle.  ABDOMEN:  Nondistended and soft.  Very tender to palpation, mostly on the  right side.  No rebound symptoms.  No masses felt.  It is also tender in the  left lower quadrant.  Straight leg elevation is negative.  No muscle  weakness.  No rash.  NEUROLOGICAL:  She is alert, oriented, cooperative.  She is anxious.   LABORATORY DATA:  Not available.   ASSESSMENT/PLAN:  1.  Mostly right-sided, right flank, abdominal pain severe and worsening      over the past  several days.  I am not sure of the exact etiology.      Differential diagnoses will include appendicitis, diverticulitis,      nephrolithiasis, pyelonephritis, shingles, radiculopathy, and others.      She may need a gastroenterology consult.  We will obtain CT of the      abdomen and pelvis with intravenous contrast.  Start intravenous fluids,      empiric Cipro 500 mg intravenously b.i.d., morphine p.r.n. pain, and      Darvocet p.r.n. pain.  Clear liquids.  Progress as tolerated.  2.  Diarrhea:  We will use Lomotil p.r.n.  3.  Previous history of allergy to intravenous contrast:  She was able to      tolerate in the past with Benadryl.  We will give her Benadryl 25 mg      prior.  We will also premedicate with Solu-Medrol 125 mg one hour prior      and six hours after.  Intravenous fluids.  4.  Chronic memory loss:  Just had a MRI of the brain which revealed      possible small vessel brain disease.  No gross abnormalities.  5.  Coronary artery disease, history of stent placement:  She is on therapy      and has not taken Lipitor lately.  6.  Hypertension:  She is on metoprolol.  7.  Gastroesophageal reflux disease:  Her gastroenterology  appointment is      pending.  She is on AcipHex 20 mg daily.        ___________________________________________  Georgina Quint Plotnikov, M.D. [facility]    AVP/MEDQ  D:  03/17/2004  T:  03/17/2004  Job:  [id number]

## 2010-08-26 NOTE — H&P (Signed)
Jenny Brady, Jenny Brady               ACCOUNT NO.:  [id number]   MEDICAL RECORD NO.:  [id number]          PATIENT TYPE:  INP   LOCATION:  1826                         FACILITY:  [facility]   PHYSICIAN:  Rosalyn Gess. Norins, M.D. LHCDATE OF BIRTH:  1925/12/19   DATE OF ADMISSION:  03/19/2004  DATE OF DISCHARGE:                                HISTORY & PHYSICAL   CHIEF COMPLAINT:  Abdominal pain and truncal weakness.   HISTORY OF PRESENT ILLNESS:  Jenny Brady was admitted March 17, 2004,  for abdominal pain and discomfort with question of diverticulitis versus  neuropathy versus pyelonephritis.  Her evaluation was unremarkable with  negative CBC, negative CMET,normal lipase, negative ESR.  She had a CT scan  of the abdomen and pelvis which was unremarkable with no specific or  significant abnormality.  The patient was thought to be stable and was  discharged home on the evening of March 18, 2004.  The patient reported at  home that night she was uncomfortable but slept through the night.  Today  she reports she is too weak to be able to sit up or get up out of bed or  turn over.  She continues to have significant pain that she describes as  being from her back all the way around to her abdomen bilaterally.  Her  weakness is such that she is unable to sit up without assistance and  actually can sit up with 2+ assistance with difficulty and discomfort.  The  patient reports she cannot move her proximal legs.  Of note, the patient  reports over the past several weeks a history of gait disorder with  stumbling, difficulty with step up.  The patient also has had a problem with  increased memory loss.  She was seen by Dr. Posey Rea in the office in late  November, and an MRI of the brain was ordered March 08, 2004, which  showed small vessel disease but no significant abnormalities, acute or  chronic.  The patient is now admitted with truncal pain and weakness with  probable  radiculopathy.   Please see the December 8 admit note for Past Medical History, Family  History, and Social History.   MEDICATIONS:  1.  Aciphex 20 mg daily.  2.  Metoprolol 25 mg daily.   REVIEW OF SYSTEMS:  The patient has had no fever, sweats, chills, or other  constitutional problems, but she has had diarrhea.  CARDIOVASCULAR:  No  complaints.  PULMONARY:  No complaints.  GI:  The patient has diarrhea as  noted.  She does have a history of IBS.   PHYSICAL EXAMINATION:  VITAL SIGNS:  Temperature 98.5, blood pressure  158/90, pulse 72, respiratory rate 24, O2 saturation 95% on room air.  GENERAL:  Obese white female in no acute distress.  HEENT:  Normocephalic and atraumatic and unremarkable.  NECK:  Supple.  CHEST:  Clear with no rales, wheezes, or rhonchi.  CARDIOVASCULAR:  2+ radial pulse, 1+ dorsalis pedis pulses.  Precordium is  quiet.  She had a regular rate and rhythm without murmurs.  ABDOMEN: Obese.  She  had positive bowel sounds in all four quadrants.  She  was diffusely tender with no focal tenderness.  There was no  hepatosplenomegaly, but exam was hindered by her size.  RECTAL/PELVIC:  Exams deferred.  SKIN:  Clear.  NEUROLOGIC:  The patient is awake, alert, oriented to person, place, time,  and context.  Her speech is clear.  Her cognition seems normal.  She is a  good historian.  Cranial nerves II-XII revealed the patient had normal  facial symmetry and muscle movement.  Extraocular muscles were intact.  Pupils equal, round, and reactive to light and accommodation.  Motor  strength:  The patient had normal grip strength.  Proximal and distal upper  extremity strength was very close, normal, and equal.  The patient had 2/6  proximal lower extremity strength, being able to raise her legs against  gravity but could not offer any resistance.  The distal lower extremity was  stronger at 3/6 but cannot maintain straight leg against resistance.  She  cannot keep her leg  flexed against resistance.  The patient's far distal  strength was normal with ankle flexion in both plantar and dorsal extension.  Sensation:  The patient had normal light touch and pinprick to the upper  extremities.  She had significantly decreased light touch to the lower  extremities from the knee down but has a relatively well preserved pinprick.  DTRs were 1+ at patellar tendons, trace to 0 at the Achilles tendon  bilaterally.   LABORATORY AND X-RAY DATA:  Repeat laboratory in the ER revealed a normal  white count at 4900, hemoglobin 14.2 g.  Chemistries were normal with a  Sodium of 144, potassium 3.1, chloride 109, CO2 29, BUN 13, creatinine 0.8,  glucose 14.  Liver functions were normal.   IMPRESSION AND PLAN:  #1.  Neurologic:  The patient is presenting with  truncal and lower extremity weakness and significant pain which radiates  around from her back and involves her whole lower abdomen.  The patient is  unable to sit up without maximum assistance.  She does have bilateral lower  extremity weakness.  She has decreased sensation.  I am concerned for  compressive radiculopathy versus a myopathy of a toxic metabolic nature.  There is no indication of any infection with the absence of fever and normal  sedimentation rate.   PLAN:  1.  Lab work to include a B12 level, heavy metal screen, RPR.  2.  Radiographic studies to include LS spine films and LS spinal MRI.  3.  We will arrange for ENG and nerve conduction studies.  4.  We will have PT/OT evaluate the patient in regard to independence and      ability to return home to independent living.  5.  Will treat her with Celebrex 200 mg daily for pain and Tramadol 50 to      100 mg four times a day as needed.   #2.  Gastrointestinal:  The patient had a recent GI evaluation by Dr. Arlyce Dice  including EGD.  She does have a known history of irritable bowel syndrome  but has not been on medication.  PLAN:  Will continue Aciphex 20  mg daily.   #3.  Cardiovascular:  The patient has had no chest pain or chest discomfort.  For completeness, will get an EKG and also cardiac enzymes x 2.       MEN/MEDQ  D:  03/19/2004  T:  03/19/2004  Job:  [id number]

## 2010-08-26 NOTE — Discharge Summary (Signed)
[name], [name] NO.:  [id number]   MEDICAL RECORD NO.:  [id number]          PATIENT TYPE:  ORB   LOCATION:  4530                         FACILITY:  [facility]   PHYSICIAN:  Rene Paci, M.D. LHCDATE OF BIRTH:  1925/07/16   DATE OF ADMISSION:  03/23/2004  DATE OF DISCHARGE:  03/30/2004                                 DISCHARGE SUMMARY   ADDENDUM:  The patient's discharge was held on March 29, 2004 because the  patient refused to leave until a physician had reviewed with her all of the  tests and studies she had had done.  Of note, this has been done in detail  throughout her hospitalization, however, we did discuss with her the result  of each and every test that she has had done during this admission.  We have  also reviewed with her the comments that the consultants have made as well.  The son also had concerns about the patient being discharged; he was not  convinced that she had met her therapy goals.  He had asked that the  therapist call him to review those goals with him prior to her discharge;  this has been arranged.  Dr. Rene Paci also talked with him at length  regarding each and every test that she had had done here and what the  outcome was.   MEDICATIONS AT DISCHARGE:  1.  Lopressor 25 mg daily.  2.  Aspirin 325 mg daily.  3.  Elavil 50 mg nightly.  4.  Levbid 0.375 mg b.i.d.  5.  Aciphex 20 mg daily.  6.  Cipro 500 mg b.i.d. for 5 days.  7.  Ultram 50 mg 1-2 tabs q.4 h. p.r.n.   FOLLOWUP:  Follow up with Dr. Georgina Quint. Plotnikov, April 19, 2003 at 2:30  p.m.      Laur   LC/MEDQ  D:  03/30/2004  T:  03/31/2004  Job:  [id number]

## 2011-01-09 LAB — DIFFERENTIAL
Basophils Relative: 1
Eosinophils Absolute: 0.1
Eosinophils Relative: 2
Lymphs Abs: 1.3
Monocytes Absolute: 0.5
Monocytes Relative: 6
Neutrophils Relative %: 73

## 2011-01-09 LAB — CBC
HCT: 38.2
Hemoglobin: 12.5
Hemoglobin: 13
MCV: 85.2
RBC: 4.48
RBC: 4.66
RDW: 14
WBC: 4.5

## 2011-01-09 LAB — TROPONIN I: Troponin I: <0.01

## 2011-01-09 LAB — URINALYSIS, ROUTINE W REFLEX MICROSCOPIC
Glucose, UA: NEGATIVE
Hgb urine dipstick: NEGATIVE
Protein, ur: NEGATIVE
Specific Gravity, Urine: 1.014

## 2011-01-09 LAB — POCT I-STAT, CHEM 8
BUN: 15
BUN: 19
Calcium, Ion: 1.23
Chloride: 104
Creatinine, Ser: 0.9
Creatinine, Ser: 1
Glucose, Bld: 115 — ABNORMAL HIGH
Glucose, Bld: 146 — ABNORMAL HIGH
HCT: 38
Hemoglobin: 12.9
Sodium: 142
TCO2: 28
TCO2: 28

## 2011-01-09 LAB — COMPREHENSIVE METABOLIC PANEL
ALT: 13
AST: 19
Albumin: 3.6
Alkaline Phosphatase: 92
Calcium: 9.5
GFR calc Af Amer: >60
Glucose, Bld: 155 — ABNORMAL HIGH
Potassium: 3.6
Sodium: 141
Total Protein: 7.2

## 2011-01-09 LAB — CK TOTAL AND CKMB (NOT AT ARMC)
Relative Index: INVALID
Total CK: 46

## 2011-01-09 LAB — B-NATRIURETIC PEPTIDE (CONVERTED LAB): Pro B Natriuretic peptide (BNP): <30

## 2011-01-09 LAB — BASIC METABOLIC PANEL
CO2: 28
Chloride: 107
Creatinine, Ser: 0.72
GFR calc non Af Amer: >60
Glucose, Bld: 114 — ABNORMAL HIGH
Potassium: 3.8

## 2011-01-09 LAB — POCT CARDIAC MARKERS: Myoglobin, poc: 72.4

## 2011-01-10 LAB — BASIC METABOLIC PANEL
BUN: 12
Chloride: 108
Creatinine, Ser: 0.7
GFR calc Af Amer: >60
GFR calc non Af Amer: >60
Potassium: 3.5

## 2011-01-10 LAB — CARDIAC PANEL(CRET KIN+CKTOT+MB+TROPI)
CK, MB: 0.6
CK, MB: 0.7
Total CK: 39
Troponin I: <0.01
Troponin I: <0.01

## 2011-03-26 ENCOUNTER — Other Ambulatory Visit: Payer: Self-pay

## 2011-03-26 ENCOUNTER — Emergency Department (HOSPITAL_COMMUNITY): Payer: Medicare Other

## 2011-03-26 ENCOUNTER — Encounter (HOSPITAL_COMMUNITY): Payer: Self-pay | Admitting: Emergency Medicine

## 2011-03-26 ENCOUNTER — Inpatient Hospital Stay (HOSPITAL_COMMUNITY)
Admission: EM | Admit: 2011-03-26 | Discharge: 2011-04-06 | DRG: 292 | Disposition: A | Discharge status: Home / Self Care | Payer: Medicare Other | Attending: Internal Medicine | Admitting: Internal Medicine

## 2011-03-26 DIAGNOSIS — F0391 Unspecified dementia with behavioral disturbance: Secondary | ICD-10-CM | POA: Diagnosis present

## 2011-03-26 DIAGNOSIS — I2510 Atherosclerotic heart disease of native coronary artery without angina pectoris: Secondary | ICD-10-CM | POA: Diagnosis present

## 2011-03-26 DIAGNOSIS — R079 Chest pain, unspecified: Secondary | ICD-10-CM

## 2011-03-26 DIAGNOSIS — F03918 Unspecified dementia, unspecified severity, with other behavioral disturbance: Secondary | ICD-10-CM | POA: Diagnosis present

## 2011-03-26 DIAGNOSIS — D649 Anemia, unspecified: Secondary | ICD-10-CM | POA: Diagnosis present

## 2011-03-26 DIAGNOSIS — R42 Dizziness and giddiness: Secondary | ICD-10-CM

## 2011-03-26 DIAGNOSIS — I509 Heart failure, unspecified: Secondary | ICD-10-CM

## 2011-03-26 DIAGNOSIS — K219 Gastro-esophageal reflux disease without esophagitis: Secondary | ICD-10-CM | POA: Diagnosis present

## 2011-03-26 DIAGNOSIS — E0590 Thyrotoxicosis, unspecified without thyrotoxic crisis or storm: Secondary | ICD-10-CM | POA: Diagnosis present

## 2011-03-26 DIAGNOSIS — N039 Chronic nephritic syndrome with unspecified morphologic changes: Secondary | ICD-10-CM | POA: Diagnosis present

## 2011-03-26 DIAGNOSIS — N183 Chronic kidney disease, stage 3 unspecified: Secondary | ICD-10-CM | POA: Diagnosis present

## 2011-03-26 DIAGNOSIS — I5023 Acute on chronic systolic (congestive) heart failure: Principal | ICD-10-CM | POA: Diagnosis present

## 2011-03-26 DIAGNOSIS — R55 Syncope and collapse: Secondary | ICD-10-CM | POA: Diagnosis present

## 2011-03-26 DIAGNOSIS — E876 Hypokalemia: Secondary | ICD-10-CM | POA: Diagnosis not present

## 2011-03-26 DIAGNOSIS — F068 Other specified mental disorders due to known physiological condition: Secondary | ICD-10-CM | POA: Diagnosis present

## 2011-03-26 DIAGNOSIS — D631 Anemia in chronic kidney disease: Secondary | ICD-10-CM | POA: Diagnosis present

## 2011-03-26 HISTORY — DX: Gastro-esophageal reflux disease without esophagitis: K21.9

## 2011-03-26 HISTORY — DX: Atherosclerotic heart disease of native coronary artery without angina pectoris: I25.10

## 2011-03-26 LAB — DIFFERENTIAL
Basophils Absolute: 0 10*3/uL (ref 0.0–0.1)
Basophils Relative: 1 % (ref 0–1)
Monocytes Relative: 7 % (ref 3–12)
Neutro Abs: 3.7 10*3/uL (ref 1.7–7.7)
Neutrophils Relative %: 69 % (ref 43–77)

## 2011-03-26 LAB — D-DIMER, QUANTITATIVE: D-Dimer, Quant: 2.23 ug/mL-FEU — ABNORMAL HIGH (ref 0.00–0.48)

## 2011-03-26 LAB — COMPREHENSIVE METABOLIC PANEL
ALT: 27 U/L (ref 0–35)
AST: 30 U/L (ref 0–37)
Albumin: 3.1 g/dL — ABNORMAL LOW (ref 3.5–5.2)
Alkaline Phosphatase: 116 U/L (ref 39–117)
Chloride: 104 mEq/L (ref 96–112)
Potassium: 3.8 mEq/L (ref 3.5–5.1)
Total Bilirubin: 0.3 mg/dL (ref 0.3–1.2)

## 2011-03-26 LAB — URINALYSIS, ROUTINE W REFLEX MICROSCOPIC
Glucose, UA: NEGATIVE mg/dL
Specific Gravity, Urine: 1.022 (ref 1.005–1.030)

## 2011-03-26 LAB — HEPARIN LEVEL (UNFRACTIONATED): Heparin Unfractionated: <0.1 IU/mL — ABNORMAL LOW (ref 0.30–0.70)

## 2011-03-26 LAB — CARDIAC PANEL(CRET KIN+CKTOT+MB+TROPI)
Relative Index: 3.5 — ABNORMAL HIGH (ref 0.0–2.5)
Troponin I: <0.3 ng/mL (ref <0.30)

## 2011-03-26 LAB — CBC
Hemoglobin: 10.7 g/dL — ABNORMAL LOW (ref 12.0–15.0)
MCHC: 31.3 g/dL (ref 30.0–36.0)

## 2011-03-26 LAB — CK TOTAL AND CKMB (NOT AT ARMC): Relative Index: 3.2 — ABNORMAL HIGH (ref 0.0–2.5)

## 2011-03-26 LAB — PROTIME-INR
INR: 0.98 (ref 0.00–1.49)
Prothrombin Time: 13.2 seconds (ref 11.6–15.2)

## 2011-03-26 LAB — URINE MICROSCOPIC-ADD ON

## 2011-03-26 MED ORDER — ASPIRIN 81 MG PO CHEW
324.0000 mg | CHEWABLE_TABLET | ORAL | Status: AC
  Administered 2011-03-26: 324 mg via ORAL
  Filled 2011-03-26: qty 4

## 2011-03-26 MED ORDER — NITROGLYCERIN 0.4 MG SL SUBL: 0.4000 mg | SUBLINGUAL_TABLET | SUBLINGUAL | Status: DC | PRN

## 2011-03-26 MED ORDER — NITROGLYCERIN 0.4 MG SL SUBL
SUBLINGUAL_TABLET | SUBLINGUAL | Status: AC
  Administered 2011-03-26: 09:00:00
  Filled 2011-03-26: qty 25

## 2011-03-26 MED ORDER — LEVOTHYROXINE SODIUM 112 MCG PO TABS
56.0000 ug | ORAL_TABLET | Freq: Every day | ORAL | Status: DC
  Administered 2011-03-26 – 2011-04-06 (×12): 56 ug via ORAL
  Filled 2011-03-26 (×13): qty 0.5

## 2011-03-26 MED ORDER — TECHNETIUM TO 99M ALBUMIN AGGREGATED
4.6000 | Freq: Once | INTRAVENOUS | Status: AC | PRN
  Administered 2011-03-26: 4.9 via INTRAVENOUS

## 2011-03-26 MED ORDER — HEPARIN SOD (PORCINE) IN D5W 100 UNIT/ML IV SOLN
1050.0000 [IU]/h | INTRAVENOUS | Status: DC
  Administered 2011-03-26: 900 [IU]/h via INTRAVENOUS
  Administered 2011-03-27: 1050 [IU]/h via INTRAVENOUS
  Filled 2011-03-26 (×3): qty 250

## 2011-03-26 MED ORDER — ACETAMINOPHEN 325 MG PO TABS
650.0000 mg | ORAL_TABLET | ORAL | Status: DC | PRN
  Administered 2011-03-28: 650 mg via ORAL
  Administered 2011-03-29: 325 mg via ORAL
  Administered 2011-04-02: 650 mg via ORAL
  Filled 2011-03-26 (×5): qty 2

## 2011-03-26 MED ORDER — HEPARIN BOLUS VIA INFUSION
1000.0000 [IU] | Freq: Once | INTRAVENOUS | Status: AC
  Administered 2011-03-26: 1000 [IU] via INTRAVENOUS
  Filled 2011-03-26: qty 1000

## 2011-03-26 MED ORDER — METHYLPREDNISOLONE SODIUM SUCC 125 MG IJ SOLR
INTRAMUSCULAR | Status: AC
  Filled 2011-03-26: qty 2

## 2011-03-26 MED ORDER — ASPIRIN 300 MG RE SUPP
300.0000 mg | RECTAL | Status: AC
  Filled 2011-03-26: qty 1

## 2011-03-26 MED ORDER — FAMOTIDINE 20 MG PO TABS
20.0000 mg | ORAL_TABLET | Freq: Every day | ORAL | Status: DC
  Administered 2011-03-26 – 2011-04-06 (×12): 20 mg via ORAL
  Filled 2011-03-26 (×13): qty 1

## 2011-03-26 MED ORDER — ASPIRIN 81 MG PO CHEW
324.0000 mg | CHEWABLE_TABLET | Freq: Once | ORAL | Status: AC
  Administered 2011-03-26: 324 mg via ORAL
  Filled 2011-03-26: qty 1

## 2011-03-26 MED ORDER — VITAMIN D3 25 MCG (1000 UNIT) PO TABS
1000.0000 [IU] | ORAL_TABLET | Freq: Every day | ORAL | Status: DC
  Administered 2011-03-26 – 2011-04-06 (×12): 1000 [IU] via ORAL
  Filled 2011-03-26 (×14): qty 1

## 2011-03-26 MED ORDER — MORPHINE SULFATE 2 MG/ML IJ SOLN
INTRAMUSCULAR | Status: AC
  Administered 2011-03-26: 4 mg via INTRAVENOUS
  Filled 2011-03-26: qty 2

## 2011-03-26 MED ORDER — MORPHINE SULFATE 4 MG/ML IJ SOLN: 4.0000 mg | Freq: Once | INTRAMUSCULAR | Status: DC

## 2011-03-26 MED ORDER — HALOPERIDOL LACTATE 5 MG/ML IJ SOLN
2.0000 mg | Freq: Once | INTRAMUSCULAR | Status: AC
  Administered 2011-03-26: 2 mg via INTRAVENOUS
  Filled 2011-03-26: qty 1

## 2011-03-26 MED ORDER — ASPIRIN EC 81 MG PO TBEC
81.0000 mg | DELAYED_RELEASE_TABLET | Freq: Every day | ORAL | Status: DC
  Administered 2011-03-27 – 2011-04-06 (×11): 81 mg via ORAL
  Filled 2011-03-26 (×12): qty 1

## 2011-03-26 MED ORDER — NITROGLYCERIN 0.4 MG SL SUBL
0.4000 mg | SUBLINGUAL_TABLET | SUBLINGUAL | Status: DC | PRN
  Administered 2011-03-26 (×2): 0.4 mg via SUBLINGUAL

## 2011-03-26 MED ORDER — ASPIRIN EC 81 MG PO TBEC
81.0000 mg | DELAYED_RELEASE_TABLET | Freq: Every day | ORAL | Status: DC
  Filled 2011-03-26: qty 1

## 2011-03-26 MED ORDER — CARVEDILOL 3.125 MG PO TABS
3.1250 mg | ORAL_TABLET | Freq: Two times a day (BID) | ORAL | Status: DC
  Administered 2011-03-26 – 2011-03-28 (×3): 3.125 mg via ORAL
  Filled 2011-03-26 (×5): qty 1

## 2011-03-26 MED ORDER — HEPARIN BOLUS VIA INFUSION
4000.0000 [IU] | Freq: Once | INTRAVENOUS | Status: AC
  Administered 2011-03-26: 4000 [IU] via INTRAVENOUS
  Filled 2011-03-26: qty 4000

## 2011-03-26 MED ORDER — ONDANSETRON HCL 4 MG/2ML IJ SOLN: 4.0000 mg | Freq: Four times a day (QID) | INTRAMUSCULAR | Status: DC | PRN

## 2011-03-26 MED ORDER — XENON XE 133 GAS
9.1000 | GAS_FOR_INHALATION | Freq: Once | RESPIRATORY_TRACT | Status: AC | PRN
  Administered 2011-03-26: 10 via RESPIRATORY_TRACT

## 2011-03-26 MED ORDER — MECLIZINE HCL 25 MG PO TABS
25.0000 mg | ORAL_TABLET | Freq: Three times a day (TID) | ORAL | Status: DC | PRN
  Filled 2011-03-26: qty 1

## 2011-03-26 MED ORDER — FUROSEMIDE 10 MG/ML IJ SOLN
40.0000 mg | Freq: Two times a day (BID) | INTRAMUSCULAR | Status: DC
  Administered 2011-03-26 (×3): 40 mg via INTRAVENOUS
  Filled 2011-03-26 (×4): qty 4

## 2011-03-26 NOTE — ED Provider Notes (Signed)
History     CSN: [id number] Arrival date & time: 03/26/2011  8:40 AM   First MD Initiated Contact with Patient 03/26/11 [phone]      No chief complaint on file.   (Consider location/radiation/quality/duration/timing/severity/associated sxs/prior treatment) The history is provided by the patient and a relative (She is a very poor and vague historian).   77 year old female is complaining of chest pain with some radiation to the neck. Pain is generally worse with exertion but history is difficult to obtain. Whenever I attempt to get any information from her, she focuses on saying that she had stents placed in her heart without her permission and that is the cause of all her problems. She also is referring to some thyroid problems. When she is able to be focused on answering questions, eyes she does state that the pain in her chest does radiate to the neck. She has difficulty describing it or giving a number to it. She doesn't seem to be somewhat dyspneic according to her son. There's been no vomiting and no diaphoresis. She has not taken anything for pain because she says that she never takes any medication before going to see a doctor. Her son had tried to give her aspirin but she refused to take it.  Past Medical History  Diagnosis Date  . Hyperthyroidism   . Coronary artery disease   . GERD (gastroesophageal reflux disease)     No past surgical history on file.  History reviewed. No pertinent family history.  History  Substance Use Topics  . Smoking status: Never Smoker   . Smokeless tobacco: Never Used  . Alcohol Use: No    OB History    Grav Para Term Preterm Abortions TAB SAB Ect Mult Living                  Review of Systems  All other systems reviewed and are negative.    Allergies  Hyoscyamine and Ivp dye  Home Medications   Current Outpatient Rx  Name Route Sig Dispense Refill  . ALPRAZOLAM 1 MG PO TABS Oral Take 1 mg by mouth 3 (three) times daily as needed.  For anxiety.     . ASPIRIN EC 81 MG PO TBEC Oral Take 81 mg by mouth daily.      Marland Kitchen VITAMIN D 1000 UNITS PO TABS Oral Take 1,000 Units by mouth daily.      Marland Kitchen VITAMIN B-12 PO Oral Take 1 tablet by mouth daily.      Marland Kitchen LEVOTHYROXINE SODIUM 112 MCG PO TABS Oral Take 56.5 mcg by mouth daily.      Marland Kitchen MECLIZINE HCL 25 MG PO TABS Oral Take 25 mg by mouth 3 (three) times daily as needed. For dizziness.     Marland Kitchen RANITIDINE HCL 150 MG PO TABS Oral Take 150 mg by mouth daily.        BP 156/74  Pulse 106  Temp(Src) 97.4 F (36.3 C) (Oral)  Resp 22  SpO2 95%  Physical Exam  Nursing note and vitals reviewed.  77 year old female who is somewhat anxious but no acute distress. Vital signs significant for mild tachycardia heart rate 106, mild tachypnea respiratory rate of 22 and mild hypertension with blood pressure 136/74. Oxygen saturation is a satisfactory 95%. Head is normocephalic atraumatic. PERRLA, EOMI. Oropharynx is clear. Neck is supple without adenopathy or JVD. Back is nontender without any presacral edema. Lungs have fine, dry rales at the left base. There no wheezes or rhonchi. Heart has  regular rate and rhythm without murmur. There is no chest wall tenderness. Abdomen is soft and moderately tender in the epigastrium without any rebound or guarding. There is no hepatosplenomegaly. Extremities have 2-3+ edema without cyanosis. Skin is warm and moist without rash. Neurologic: Mental status is normal except for anxiety have, cranial nerves are intact, there no focal motor Center deficits. Psychiatric: Other than anxiety, there are no abnormalities of mood or affect.  ED Course  Procedures (including critical care time)  Labs Reviewed - No data to display No results found.  Results for orders placed during the hospital encounter of 03/26/11  CBC      Component Value Range   WBC 5.4  4.0 - 10.5 (K/uL)   RBC 4.21  3.87 - 5.11 (MIL/uL)   Hemoglobin 10.7 (*) 12.0 - 15.0 (g/dL)   HCT 16.1 (*) 09.6 -  46.0 (%)   MCV 81.2  78.0 - 100.0 (fL)   MCH 25.4 (*) 26.0 - 34.0 (pg)   MCHC 31.3  30.0 - 36.0 (g/dL)   RDW 04.5  40.9 - 81.1 (%)   Platelets 277  150 - 400 (K/uL)  DIFFERENTIAL      Component Value Range   Neutrophils Relative 69  43 - 77 (%)   Neutro Abs 3.7  1.7 - 7.7 (K/uL)   Lymphocytes Relative 22  12 - 46 (%)   Lymphs Abs 1.2  0.7 - 4.0 (K/uL)   Monocytes Relative 7  3 - 12 (%)   Monocytes Absolute 0.4  0.1 - 1.0 (K/uL)   Eosinophils Relative 1  0 - 5 (%)   Eosinophils Absolute 0.1  0.0 - 0.7 (K/uL)   Basophils Relative 1  0 - 1 (%)   Basophils Absolute 0.0  0.0 - 0.1 (K/uL)  COMPREHENSIVE METABOLIC PANEL      Component Value Range   Sodium 139  135 - 145 (mEq/L)   Potassium 3.8  3.5 - 5.1 (mEq/L)   Chloride 104  96 - 112 (mEq/L)   CO2 26  19 - 32 (mEq/L)   Glucose, Bld 164 (*) 70 - 99 (mg/dL)   BUN 13  6 - 23 (mg/dL)   Creatinine, Ser 9.14  0.50 - 1.10 (mg/dL)   Calcium 8.8  8.4 - 78.2 (mg/dL)   Total Protein 6.3  6.0 - 8.3 (g/dL)   Albumin 3.1 (*) 3.5 - 5.2 (g/dL)   AST 30  0 - 37 (U/L)   ALT 27  0 - 35 (U/L)   Alkaline Phosphatase 116  39 - 117 (U/L)   Total Bilirubin 0.3  0.3 - 1.2 (mg/dL)   GFR calc non Af Amer 79 (*) >90 (mL/min)   GFR calc Af Amer >90  >90 (mL/min)  MAGNESIUM      Component Value Range   Magnesium 1.8  1.5 - 2.5 (mg/dL)  TROPONIN I      Component Value Range   Troponin I <0.30  <0.30 (ng/mL)  PRO B NATRIURETIC PEPTIDE      Component Value Range   Pro B Natriuretic peptide (BNP) 3751.0 (*) 0 - 450 (pg/mL)  URINALYSIS, ROUTINE W REFLEX MICROSCOPIC      Component Value Range   Color, Urine YELLOW  YELLOW    APPearance CLOUDY (*) CLEAR    Specific Gravity, Urine 1.022  1.005 - 1.030    pH 7.5  5.0 - 8.0    Glucose, UA NEGATIVE  NEGATIVE (mg/dL)   Hgb urine dipstick TRACE (*) NEGATIVE  Bilirubin Urine NEGATIVE  NEGATIVE    Ketones, ur 15 (*) NEGATIVE (mg/dL)   Protein, ur 161 (*) NEGATIVE (mg/dL)   Urobilinogen, UA 1.0  0.0 - 1.0  (mg/dL)   Nitrite NEGATIVE  NEGATIVE    Leukocytes, UA NEGATIVE  NEGATIVE   PROTIME-INR      Component Value Range   Prothrombin Time 13.2  11.6 - 15.2 (seconds)   INR 0.98  0.00 - 1.49   APTT      Component Value Range   aPTT 26  24 - 37 (seconds)  D-DIMER, QUANTITATIVE      Component Value Range   D-Dimer, Quant 2.23 (*) 0.00 - 0.48 (ug/mL-FEU)  URINE MICROSCOPIC-ADD ON      Component Value Range   Squamous Epithelial / LPF RARE  RARE    WBC, UA 0-2  <3 (WBC/hpf)   RBC / HPF 0-2  <3 (RBC/hpf)   Urine-Other LESS THAN 10 mL OF URINE SUBMITTED     Dg Chest Portable 1 View  03/26/2011  *RADIOLOGY REPORT*  Clinical Data: Chest pain.  History of heart stents.  History diabetes.  PORTABLE CHEST - 1 VIEW  Comparison: 02/08/2008  Findings: The heart is enlarged.  There are interstitial changes consistent with pulmonary edema.  More focal opacity the left upper lobe may be representative of confluent edema but infectious infiltrate cannot be excluded.  There may be small bilateral pleural effusions.  Degenerative changes are seen in the spine.  IMPRESSION:  1.  Cardiomegaly and pulmonary edema. 2.  Confluent edema versus infectious infiltrate in the left upper lobe.  Original Report Authenticated By: Patterson Hammersmith, M.D.     No diagnosis found.   Date: 03/26/2011  Rate: 106  Rhythm: sinus tachycardia  QRS Axis: normal  Intervals: QT prolonged  ST/T Wave abnormalities: Slight ST depression in the anterolateral leads and nonspecific T-wave changes with the inverted T wave in V6  Conduction Disutrbances:nonspecific intraventricular conduction delay  Narrative Interpretation: Sinus tachycardia with nonspecific intraventricular conduction delay possibly due to LVH, nonspecific ST and T changes but concerning for ischemia. When compared with ECG 04/29/2009, QRS is wider, ST depression and T-wave inversion are now present, QTC is longer.  Old EKG Reviewed: changes noted  She has gotten  complete relief of chest pain with 2 nitroglycerin. She was placed on a heparin drip to treat probable acute coronary syndrome. She was given aspirin in the emergency department. Her troponin has come back normal but d-dimer is elevated. Because of allergy to IV dye, VQ scan will be obtained. Case is discussed with [facility]ad hospitalists who will come to admit the patient.  CRITICAL CARE Performed by: Dione Booze   Total critical care time: 55 minutes  Critical care time was exclusive of separately billable procedures and treating other patients.  Critical care was necessary to treat or prevent imminent or life-threatening deterioration.  Critical care was time spent personally by me on the following activities: development of treatment plan with patient and/or surrogate as well as nursing, discussions with consultants, evaluation of patient's response to treatment, examination of patient, obtaining history from patient or surrogate, ordering and performing treatments and interventions, ordering and review of laboratory studies, ordering and review of radiographic studies, pulse oximetry and re-evaluation of patient's condition.   MDM  Chest pain-possible acute coronary syndrome.        Dione Booze, MD 03/26/11 1049

## 2011-03-26 NOTE — Progress Notes (Signed)
ANTICOAGULATION CONSULT NOTE - Follow Up Consult  Pharmacy Consult for IV heparin Indication: ACS/STEMI  Allergies  Allergen Reactions  . Hyoscyamine   . Ivp Dye (Iodinated Diagnostic Agents)     Patient Measurements: Height: 5\' 6"  (167.6 cm) Weight: 190 lb 11.2 oz (86.5 kg) IBW/kg (Calculated) : 59.3 Per patient and RN:  Pt wts 160 lbs = 73 kg and is 5'6'' Ideal Body Weight = 60 kg  Vital Signs: Temp: 97.8 F (36.6 C) (12/16 1800) Temp src: Oral (12/16 1800) BP: 125/62 mmHg (12/16 1800) Pulse Rate: 110  (12/16 1800)  Labs:  Basename 03/26/11 1930 03/26/11 1818 03/26/11 0925 03/26/11 0924 03/26/11 0840  HGB -- -- -- -- 10.7*  HCT -- -- -- -- 34.2*  PLT -- -- -- -- 277  APTT -- -- -- -- 26  LABPROT -- -- -- -- 13.2  INR -- -- -- -- 0.98  HEPARINUNFRC 0.28* -- -- <0.10* --  CREATININE -- -- -- -- 0.67  CKTOTAL -- 112 111 -- --  CKMB -- 3.9 3.6 -- --  TROPONINI -- <0.30 <0.30 -- --   Estimated Creatinine Clearance: 59 ml/min (by C-G formula based on Cr of 0.67).  Medical History: Past Medical History  Diagnosis Date  . Hyperthyroidism     had RAI-is on thyroid replacement, happened maybe this summer  . Coronary artery disease     Had a catheterization per son by Dr. [name] in 1991 no reports in  [place]  . GERD (gastroesophageal reflux disease)     Medications:  Scheduled:     . aspirin  324 mg Oral Once  . aspirin  324 mg Oral NOW   Or  . aspirin  300 mg Rectal NOW  . aspirin EC  81 mg Oral Daily  . carvedilol  3.125 mg Oral BID WC  . cholecalciferol  1,000 Units Oral Daily  . famotidine  20 mg Oral Daily  . furosemide  40 mg Intravenous BID  . haloperidol lactate  2 mg Intravenous Once  . heparin  4,000 Units Intravenous Once  . levothyroxine  56 mcg Oral Daily  . methylPREDNISolone sodium succinate      . morphine      .  morphine injection  4 mg Intravenous Once  . nitroGLYCERIN      . DISCONTD: aspirin EC  81 mg Oral Daily   Infusions:       . heparin 900 Units (03/26/11 1800)    Assessment:  85 YOF admitted with CP to start IV heparin for r/o ACS/STEMI  Updated patient weight is 86.5 kg --> heparin dosing weight ~ 78kg  Heparin level subtherapeutic at 0.28.  Goal of Therapy:  Heparin level 0.3-0.7 units/ml   Plan:   Heparin 1000 units IV x 1 as bolus then heparin 1050 units/hr  Heparin level 8 hours following dose change, which is the same time the daily level will be drawn so will f/u at this time.  Daily CBC and heparin level  Clance Boll 03/26/2011,9:09 PM

## 2011-03-26 NOTE — ED Notes (Signed)
Patient transported to X-ray( Nuc Med) with nurse and monitor.

## 2011-03-26 NOTE — Consult Note (Signed)
CARDIOLOGY CONSULT NOTE  Patient ID: Jenny Brady MRN: [id number] DOB/AGE: 1925/07/10 77 y.o.  Admit date: 03/26/2011 Primary Physician "Brownlee Doctor" Primary Cardiologist None Chief Complaint  Syncope  HPI:   The patient presents Apparently for evaluation of dizziness and falls. She came to me no details and seems quite confused. She reiterates many times that she had stents placed in her heart rate she didn't need to want her neck she's had problems since then. She vaguely describe some chest discomfort across her chest. She cannot quantify her quantified. She doesn't describe any palpitations. She has a weakness and dizziness and apparently loses balance. She doesn't describe program motor or neurologic deficits however. There she appears dyspneic on this exam she would denies any shortness of breath, PND or orthopnea.  I didn't see that she has had coronary disease with stenting but I don't see the details. Her last echo was in 2009 with an EF of 60%. I do not see that she seen a cardiologist in some time. In the emergency room she does have an elevated BNP. Paronychia markers are negative. There is some edema chest x-ray and V/Q was normal.  Past Medical History  Diagnosis Date  . Hyperthyroidism     had RAI-is on thyroid replacement  . Coronary artery disease     stents (no data in echart)  . GERD (gastroesophageal reflux disease)     Past Surgical History  Procedure Date  . None     Allergies  Allergen Reactions  . Hyoscyamine   . Ivp Dye (Iodinated Diagnostic Agents)    Prior to Admission medications   Medication Sig      ALPRAZolam (XANAX) 0.5 MG tablet Take 0.5 mg by mouth 3 (three) times daily as needed. For anxiety.       aspirin EC 81 MG tablet Take 81 mg by mouth daily.        cholecalciferol (VITAMIN D) 1000 UNITS tablet Take 1,000 Units by mouth daily.        Cyanocobalamin (VITAMIN B-12 PO) Take 1 tablet by mouth daily.        levothyroxine (SYNTHROID,  LEVOTHROID) 112 MCG tablet Take 56 mcg by mouth daily.       meclizine (ANTIVERT) 25 MG tablet Take 25 mg by mouth 3 (three) times daily as needed. For dizziness.       ranitidine (ZANTAC) 150 MG tablet Take 150 mg by mouth daily as needed. For heartburn.        Family History  Problem Relation Age of Onset           . Dementia Father     died 73           . Cervical cancer Sister     History   Social History  . Marital Status: Widowed    Spouse Name: N/A    Number of Children: N/A  . Years of Education: N/A   Occupational History  . Not on file.   Social History Main Topics  . Smoking status: Never Smoker   . Smokeless tobacco: Never Used  . Alcohol Use: No  . Drug Use: No  . Sexually Active:    Other Topics Concern  . Not on file   Social History Narrative   [phone] Lodema [place], [place]-lives with them     As stated in the HPI and negative for all other systems.  Physical Exam: Blood pressure 140/85, pulse 107, temperature 97.6 F (36.4 C), temperature source  Oral, resp. rate 22, SpO2 96.00%.  GENERAL:  Quite agitated, disheveled HEENT:  Pupils equal round and reactive, fundi not visualized, oral mucosa unremarkable NECK:  No jugular venous distention, waveform within normal limits, carotid upstroke brisk and symmetric, no bruits, no thyromegaly LYMPHATICS:  No cervical, inguinal adenopathy LUNGS:  Bilateral crackles BACK:  No CVA tenderness CHEST:  Unremarkable HEART:  PMI not displaced or sustained,S1 and S2 within normal limits, no S3, positive S4, no clicks, no rubs, no murmurs ABD:  Flat, positive bowel sounds normal in frequency in pitch, no bruits, no rebound, no guarding, no midline pulsatile mass, no hepatomegaly, no splenomegaly EXT:  2 plus pulses throughout, moderate bilateral lower extremity edema, no cyanosis no clubbing SKIN:  No rashes no nodules NEURO:  Cranial nerves II through XII grossly intact, motor grossly intact  throughout PSYCH:  Cognitively intact, oriented to person place and time   Labs: Lab Results  Component Value Date   BUN 13 03/26/2011   Lab Results  Component Value Date   CREATININE 0.67 03/26/2011   Lab Results  Component Value Date   NA 139 03/26/2011   K 3.8 03/26/2011   CL 104 03/26/2011   CO2 26 03/26/2011   Lab Results  Component Value Date   CKTOTAL 111 03/26/2011   CKMB 3.6 03/26/2011   TROPONINI <0.30 03/26/2011   Lab Results  Component Value Date   WBC 5.4 03/26/2011   HGB 10.7* 03/26/2011   HCT 34.2* 03/26/2011   MCV 81.2 03/26/2011   PLT 277 03/26/2011   Lab Results  Component Value Date   CHOL 244* 04/27/2006   HDL 57.5 04/27/2006   LDLDIRECT 165.8 04/27/2006   TRIG 128 04/27/2006   CHOLHDL 4.2 CALC 04/27/2006   Lab Results  Component Value Date   ALT 27 03/26/2011   AST 30 03/26/2011   ALKPHOS 116 03/26/2011   BILITOT 0.3 03/26/2011      Radiology:  VQ - Low probability.  CXR - Cardiomegaly and pulmonary edema.  EKG:  03/26/2011  No EKG done today  ASSESSMENT AND PLAN:   1) CAD:  History this don't have details. She is denying any chest discomfort. At this point I would cycle cardiac enzymes. However, unless there are significant symptoms or enzyme elevations were likely pursue noninvasive evaluation her medical management.  2) CHF:  She has volume overload. She should be gently diuresed. We can check an echocardiogram. Further management will be based on these results.  3) Dizziness:  She continues to mention this. I cannot get from her a history of his syncope. She should have orthostatic blood pressures checked. We will watch her on telemetry and otherwise proceed as above.   SignedRollene Rotunda 03/26/2011, 2:56 PM

## 2011-03-26 NOTE — H&P (Addendum)
Jenny Brady is an 77 y.o. female.     Jenny Peck, MD, MD  CC-Syncope  HPI-77 y/o greek-had some pain in her chest "states put stents in her heart without her approval"  Stents were placed a few years back--thinks 4-5 yr ago.  Has been getting dizzy spells and fell down-unprovoked, and fell on the steps-doesn't recall when, last time was be a week ago.  Cannot eally state any sepcific worsening issue re: dizzy spells, but seems worse with walking Very tangential hisotiran and not able to relate muhc of her medcial issues to me  Spoke with Son-Today stated to son that she had heart, and kidney pain/stomach.  Her glands in her neck were also hurting her.  Had also some sharp pain going up her legs.  Also stated she had a knot in her privates. Breathing issues as well as nothing subsided was brought over When she is out walking seemed to get real tired and would have labored breathing--started maybe 1-2 weeks ago.  Hasn't had these issues before. Legs seem more swollen to her and reported to her son.  No recent h/o travel.  No h/o blood clots in her legs Refused her regular ASA and other meds this am. Seemed to have been coughing/burping more recently per son with many non-specific c/o's   Past Medical History  Diagnosis Date  . Hyperthyroidism     had RAI-is on thyroid replacement, happened maybe this summer  . Coronary artery disease     Had a catheterization per son by Dr. [name] in 1991 no reports in  [place]  . GERD (gastroesophageal reflux disease)     No past surgical history on file.  Family History  Problem Relation Age of Onset  . Acne Mother     died giving birth to 8th kid  . Dementia Father     died 86  . Acne Brother     died 2-3 yrs ago  . Cervical cancer Sister    Social History:  reports that she has never smoked. She has never used smokeless tobacco. She reports that she does not drink alcohol or use illicit drugs.  Allergies:  Allergies    Allergen Reactions  . Hyoscyamine   . Ivp Dye (Iodinated Diagnostic Agents)     Medications Prior to Admission  Medication Dose Route Frequency Provider Last Rate Last Dose  . aspirin chewable tablet 324 mg  324 mg Oral Once Dione Booze, MD   324 mg at 03/26/11 1004  . haloperidol lactate (HALDOL) injection 2 mg  2 mg Intravenous Once Pleas Koch, MD      . heparin bolus via infusion 4,000 Units  4,000 Units Intravenous Once Theda Sers, PHARMD   4,000 Units at 03/26/11 1045   Followed by  . heparin ADULT infusion 100 units/ml (25000 units/250 ml)  900 Units/hr Intravenous Continuous Theda Sers, PHARMD 9 mL/hr at 03/26/11 1043 900 Units/hr at 03/26/11 1043  . nitroGLYCERIN (NITROSTAT) 0.4 MG SL tablet           . nitroGLYCERIN (NITROSTAT) SL tablet 0.4 mg  0.4 mg Sublingual Q5 Min x 3 PRN Dione Booze, MD   0.4 mg at 03/26/11 0915   No current outpatient prescriptions on file as of 03/26/2011.    Results for orders placed during the hospital encounter of 03/26/11 (from the past 48 hour(s))  CBC     Status: Abnormal   Collection Time   03/26/11  8:40 AM  Component Value Range Comment   WBC 5.4  4.0 - 10.5 (K/uL)    RBC 4.21  3.87 - 5.11 (MIL/uL)    Hemoglobin 10.7 (*) 12.0 - 15.0 (g/dL)    HCT 16.1 (*) 09.6 - 46.0 (%)    MCV 81.2  78.0 - 100.0 (fL)    MCH 25.4 (*) 26.0 - 34.0 (pg)    MCHC 31.3  30.0 - 36.0 (g/dL)    RDW 04.5  40.9 - 81.1 (%)    Platelets 277  150 - 400 (K/uL)   DIFFERENTIAL     Status: Normal   Collection Time   03/26/11  8:40 AM      Component Value Range Comment   Neutrophils Relative 69  43 - 77 (%)    Neutro Abs 3.7  1.7 - 7.7 (K/uL)    Lymphocytes Relative 22  12 - 46 (%)    Lymphs Abs 1.2  0.7 - 4.0 (K/uL)    Monocytes Relative 7  3 - 12 (%)    Monocytes Absolute 0.4  0.1 - 1.0 (K/uL)    Eosinophils Relative 1  0 - 5 (%)    Eosinophils Absolute 0.1  0.0 - 0.7 (K/uL)    Basophils Relative 1  0 - 1 (%)    Basophils Absolute 0.0  0.0 -  0.1 (K/uL)   COMPREHENSIVE METABOLIC PANEL     Status: Abnormal   Collection Time   03/26/11  8:40 AM      Component Value Range Comment   Sodium 139  135 - 145 (mEq/L)    Potassium 3.8  3.5 - 5.1 (mEq/L)    Chloride 104  96 - 112 (mEq/L)    CO2 26  19 - 32 (mEq/L)    Glucose, Bld 164 (*) 70 - 99 (mg/dL)    BUN 13  6 - 23 (mg/dL)    Creatinine, Ser 9.14  0.50 - 1.10 (mg/dL)    Calcium 8.8  8.4 - 10.5 (mg/dL)    Total Protein 6.3  6.0 - 8.3 (g/dL)    Albumin 3.1 (*) 3.5 - 5.2 (g/dL)    AST 30  0 - 37 (U/L)    ALT 27  0 - 35 (U/L)    Alkaline Phosphatase 116  39 - 117 (U/L)    Total Bilirubin 0.3  0.3 - 1.2 (mg/dL)    GFR calc non Af Amer 79 (*) >90 (mL/min)    GFR calc Af Amer >90  >90 (mL/min)   MAGNESIUM     Status: Normal   Collection Time   03/26/11  8:40 AM      Component Value Range Comment   Magnesium 1.8  1.5 - 2.5 (mg/dL)   PROTIME-INR     Status: Normal   Collection Time   03/26/11  8:40 AM      Component Value Range Comment   Prothrombin Time 13.2  11.6 - 15.2 (seconds)    INR 0.98  0.00 - 1.49    APTT     Status: Normal   Collection Time   03/26/11  8:40 AM      Component Value Range Comment   aPTT 26  24 - 37 (seconds)   D-DIMER, QUANTITATIVE     Status: Abnormal   Collection Time   03/26/11  8:40 AM      Component Value Range Comment   D-Dimer, Quant 2.23 (*) 0.00 - 0.48 (ug/mL-FEU)   HEPARIN LEVEL (UNFRACTIONATED)     Status: Abnormal  Collection Time   03/26/11  9:24 AM      Component Value Range Comment   Heparin Unfractionated <0.10 (*) 0.30 - 0.70 (IU/mL)   TROPONIN I     Status: Normal   Collection Time   03/26/11  9:25 AM      Component Value Range Comment   Troponin I <0.30  <0.30 (ng/mL)   CK TOTAL AND CKMB     Status: Abnormal   Collection Time   03/26/11  9:25 AM      Component Value Range Comment   Total CK 111  7 - 177 (U/L)    CK, MB 3.6  0.3 - 4.0 (ng/mL)    Relative Index 3.2 (*) 0.0 - 2.5    PRO B NATRIURETIC PEPTIDE      Status: Abnormal   Collection Time   03/26/11  9:29 AM      Component Value Range Comment   Pro B Natriuretic peptide (BNP) 3751.0 (*) 0 - 450 (pg/mL)   URINALYSIS, ROUTINE W REFLEX MICROSCOPIC     Status: Abnormal   Collection Time   03/26/11  9:53 AM      Component Value Range Comment   Color, Urine YELLOW  YELLOW     APPearance CLOUDY (*) CLEAR     Specific Gravity, Urine 1.022  1.005 - 1.030     pH 7.5  5.0 - 8.0     Glucose, UA NEGATIVE  NEGATIVE (mg/dL)    Hgb urine dipstick TRACE (*) NEGATIVE     Bilirubin Urine NEGATIVE  NEGATIVE     Ketones, ur 15 (*) NEGATIVE (mg/dL)    Protein, ur 045 (*) NEGATIVE (mg/dL)    Urobilinogen, UA 1.0  0.0 - 1.0 (mg/dL)    Nitrite NEGATIVE  NEGATIVE     Leukocytes, UA NEGATIVE  NEGATIVE    URINE MICROSCOPIC-ADD ON     Status: Normal   Collection Time   03/26/11  9:53 AM      Component Value Range Comment   Squamous Epithelial / LPF RARE  RARE     WBC, UA 0-2  <3 (WBC/hpf)    RBC / HPF 0-2  <3 (RBC/hpf)    Urine-Other LESS THAN 10 mL OF URINE SUBMITTED   MUCOUS PRESENT   Dg Chest Portable 1 View  03/26/2011  *RADIOLOGY REPORT*  Clinical Data: Chest pain.  History of heart stents.  History diabetes.  PORTABLE CHEST - 1 VIEW  Comparison: 02/08/2008  Findings: The heart is enlarged.  There are interstitial changes consistent with pulmonary edema.  More focal opacity the left upper lobe may be representative of confluent edema but infectious infiltrate cannot be excluded.  There may be small bilateral pleural effusions.  Degenerative changes are seen in the spine.  IMPRESSION:  1.  Cardiomegaly and pulmonary edema. 2.  Confluent edema versus infectious infiltrate in the left upper lobe.  Original Report Authenticated By: Patterson Hammersmith, M.D.    Review of Systems  Unable to perform ROS: dementia    Blood pressure 140/85, pulse 107, temperature 97.6 F (36.4 C), temperature source Oral, resp. rate 22, SpO2 96.00%. Physical Exam    Constitutional: She appears well-developed and well-nourished.  HENT:  Head: Normocephalic and atraumatic.       Slight elevation of JVD  Eyes: Pupils are equal, round, and reactive to light.  Neck: Normal range of motion. Neck supple. JVD present. No thyromegaly present.  Cardiovascular: Normal heart sounds.  Exam reveals no gallop and  no friction rub.   No murmur heard.      Tachycardic to low 100's  Respiratory: Effort normal. No stridor.       dimished Ae with poor resp effort   Musculoskeletal: Normal range of motion.  Lymphadenopathy:    She has no cervical adenopathy.  Neurological: She is alert.       Perseveres on numerous issues, and not abkle to fully describe events leading up to Ed visit  Skin:       Onychogryphosis and grade 2 LE edema, non-pitting  Psychiatric: She has a normal mood and affect.     Assessment/Plan  Patient Active Hospital Problem List: CP-Pe vs ACS (03/26/2011)   Assessment:Likely PE Or ACS First set CE's done wnl EKG shows St-t wave changes across pre-cordium, PR=0.16, QRS=0.12, small s-wave in lead 1 AVl is flipped from prior EKG Will get v/q scan and stat Echocardiogram to assess for wall-motion anomaly vs Strain from Pulmonary embolism-though her EKG shows more LVH>RVH strain pattern--could also be repolarization changes seen  Will try to off load her with lasix 40 iv bid x 2days  Given ASA in Ed, add low dose BB as well right now-might need low dose ACE  Agree with IV heparin Gtt  Started by ED, which would cover both issues Have formally consulted Cardiology-on-call-Appreciate input in advance-if PE negative, might need further work-up inclusive of Cardiac cath per Cardiologist  ?CHF-BNP in the past less than 30, now above 3700.  Will likely require a diuretic as per above discussion  DEMENTIA-mod-severe (02/19/2008)   Assessment: Moderate-needs re-orientation and reassesement as outpt   CAD (?stent) cath 90's [place] (11/03/2006)    Assessment: See above-I have searched through E-chart without success of ever finding any report of prior cardiac issues.  Hyperthyroidism-s/p RAI admin ()   Assessment-seems stabilized and has seen Dr. Alyse Low of endocrine for management of this.  Will get a TSH to ensure no other overt cause for CP like a thyroid storm, given the fact that she needed more than 1 episode of therapy with RAI for treatment of this.  GERD (gastroesophageal reflux disease) ()   Assessment: Has a h/o dyspahgia, and also of Decreased gastric motility as well   >1 hour seeing, coordinating care and family discussion  Malashia Kamaka,JAI 03/26/2011, 12:42 PM

## 2011-03-26 NOTE — Progress Notes (Addendum)
ANTICOAGULATION CONSULT NOTE - Initial Consult  Pharmacy Consult for IV heparin Indication: ACS/STEMI  Allergies  Allergen Reactions  . Hyoscyamine   . Ivp Dye (Iodinated Diagnostic Agents)     Patient Measurements:  Per patient and RN:  Pt wts 160 lbs = 73 kg and is 5'6'' Ideal Body Weight = 60 kg  Vital Signs: Temp: 97.4 F (36.3 C) (12/16 0852) Temp src: Oral (12/16 0852) BP: 156/74 mmHg (12/16 0852) Pulse Rate: 106  (12/16 0852)  Labs: No results found for this basename: HGB:2,HCT:3,PLT:3,APTT:3,LABPROT:3,INR:3,HEPARINUNFRC:3,CREATININE:3,CKTOTAL:3,CKMB:3,TROPONINI:3 in the last 72 hours CrCl is unknown because there is no height on file for the current visit.  Medical History: Past Medical History  Diagnosis Date  . Hyperthyroidism   . Coronary artery disease   . GERD (gastroesophageal reflux disease)     Medications:  Scheduled:    . aspirin  324 mg Oral Once  . nitroGLYCERIN       Infusions:    Assessment:  22 YOF admitted with CP to start IV heparin for r/o ACS/STEMI  Baseline labs pending  Per patient and RN:  Pt wts 160 lbs = 73 kg and is 5'6''.  Ideal Body Weight = 60 kg. Doubt this is accurate.  Will use actual body weight for heparin dosing.   Patient was not on any anticoagulant other than ASA prior to admission  Goal of Therapy:  Heparin level 0.3-0.7 units/ml   Plan:   Follow up baseline labs (PT/INR, PTT)  Heparin 4000 units IV x 1 as bolus then heparin 900 units/hr  Heparin level 8 hours post heparin initiation  Daily CBC and heparin level  Jenny Brady Thi 03/26/2011,10:03 AM   Baseline labs wnl.  Ok to start heparin as written above. RN notified.

## 2011-03-26 NOTE — ED Notes (Signed)
Chest pain relieved with 2 nitro

## 2011-03-26 NOTE — ED Notes (Signed)
Pt became agitated  and restless Haldol 2mg  Mophine 4mg 

## 2011-03-27 DIAGNOSIS — R42 Dizziness and giddiness: Secondary | ICD-10-CM

## 2011-03-27 DIAGNOSIS — I2510 Atherosclerotic heart disease of native coronary artery without angina pectoris: Secondary | ICD-10-CM

## 2011-03-27 DIAGNOSIS — I059 Rheumatic mitral valve disease, unspecified: Secondary | ICD-10-CM

## 2011-03-27 LAB — CARDIAC PANEL(CRET KIN+CKTOT+MB+TROPI)
Relative Index: 3.6 — ABNORMAL HIGH (ref 0.0–2.5)
Relative Index: INVALID (ref 0.0–2.5)
Total CK: 100 U/L (ref 7–177)
Troponin I: <0.3 ng/mL (ref <0.30)

## 2011-03-27 LAB — BASIC METABOLIC PANEL
BUN: 15 mg/dL (ref 6–23)
CO2: 28 mEq/L (ref 19–32)
Calcium: 8.9 mg/dL (ref 8.4–10.5)
Chloride: 103 mEq/L (ref 96–112)
Creatinine, Ser: 0.75 mg/dL (ref 0.50–1.10)
GFR calc Af Amer: 88 mL/min — ABNORMAL LOW (ref >90)
GFR calc non Af Amer: 76 mL/min — ABNORMAL LOW (ref >90)
Glucose, Bld: 128 mg/dL — ABNORMAL HIGH (ref 70–99)
Potassium: 3.8 mEq/L (ref 3.5–5.1)
Sodium: 140 mEq/L (ref 135–145)

## 2011-03-27 LAB — CBC
HCT: 30.6 % — ABNORMAL LOW (ref 36.0–46.0)
MCH: 25 pg — ABNORMAL LOW (ref 26.0–34.0)
MCHC: 31.4 g/dL (ref 30.0–36.0)
MCV: 79.7 fL (ref 78.0–100.0)
RDW: 14.5 % (ref 11.5–15.5)

## 2011-03-27 LAB — LIPID PANEL
HDL: 57 mg/dL (ref >39)
LDL Cholesterol: 77 mg/dL (ref 0–99)
Total CHOL/HDL Ratio: 2.6 RATIO
Triglycerides: 60 mg/dL (ref <150)

## 2011-03-27 LAB — PROTIME-INR: Prothrombin Time: 15 seconds (ref 11.6–15.2)

## 2011-03-27 MED ORDER — HALOPERIDOL LACTATE 5 MG/ML IJ SOLN
1.0000 mg | INTRAMUSCULAR | Status: DC | PRN
  Administered 2011-03-27 – 2011-03-28 (×3): 1 mg via INTRAVENOUS
  Filled 2011-03-27 (×2): qty 1

## 2011-03-27 MED ORDER — LORAZEPAM 2 MG/ML IJ SOLN
1.0000 mg | INTRAMUSCULAR | Status: DC | PRN
  Administered 2011-03-27 – 2011-03-29 (×3): 1 mg via INTRAVENOUS
  Filled 2011-03-27 (×4): qty 1

## 2011-03-27 MED ORDER — ALPRAZOLAM 0.5 MG PO TABS
0.5000 mg | ORAL_TABLET | Freq: Three times a day (TID) | ORAL | Status: DC | PRN
  Administered 2011-03-31 – 2011-04-02 (×4): 0.5 mg via ORAL
  Filled 2011-03-27 (×4): qty 1

## 2011-03-27 MED ORDER — FUROSEMIDE 40 MG PO TABS
60.0000 mg | ORAL_TABLET | Freq: Every day | ORAL | Status: DC
  Administered 2011-03-27 – 2011-03-29 (×3): 60 mg via ORAL
  Filled 2011-03-27 (×3): qty 1

## 2011-03-27 MED ORDER — ASPIRIN EC 81 MG PO TBEC: 81.0000 mg | DELAYED_RELEASE_TABLET | Freq: Every day | ORAL | Status: DC

## 2011-03-27 MED ORDER — HALOPERIDOL LACTATE 5 MG/ML IJ SOLN
INTRAMUSCULAR | Status: AC
  Administered 2011-03-27: 1 mg via INTRAVENOUS
  Filled 2011-03-27: qty 1

## 2011-03-27 NOTE — Progress Notes (Signed)
SUBJECTIVE:  Denies SOB.  No chest pain   PHYSICAL EXAM Filed Vitals:   03/26/11 1924 03/26/11 2000 03/27/11 0000 03/27/11 0400  BP: 131/76   130/70  Pulse: 102 94    Temp:  97.6 F (36.4 C) 98.1 F (36.7 C) 97.6 F (36.4 C)  TempSrc:  Oral Oral Oral  Resp:  10    Height:      Weight:      SpO2: 98% 98%     General:  No distress Lungs:  Few scattered crackles Heart:  RRR Abdomen:  Positive bowel sounds, no rebound no guarding Extremities:  No edema.  LABS: Lab Results  Component Value Date   CKTOTAL 97 03/27/2011   CKMB 3.5 03/27/2011   TROPONINI <0.30 03/27/2011   Results for orders placed during the hospital encounter of 03/26/11 (from the past 24 hour(s))  CBC     Status: Abnormal   Collection Time   03/26/11  8:40 AM      Component Value Range   WBC 5.4  4.0 - 10.5 (K/uL)   RBC 4.21  3.87 - 5.11 (MIL/uL)   Hemoglobin 10.7 (*) 12.0 - 15.0 (g/dL)   HCT 29.5 (*) 62.1 - 46.0 (%)   MCV 81.2  78.0 - 100.0 (fL)   MCH 25.4 (*) 26.0 - 34.0 (pg)   MCHC 31.3  30.0 - 36.0 (g/dL)   RDW 30.8  65.7 - 84.6 (%)   Platelets 277  150 - 400 (K/uL)  DIFFERENTIAL     Status: Normal   Collection Time   03/26/11  8:40 AM      Component Value Range   Neutrophils Relative 69  43 - 77 (%)   Neutro Abs 3.7  1.7 - 7.7 (K/uL)   Lymphocytes Relative 22  12 - 46 (%)   Lymphs Abs 1.2  0.7 - 4.0 (K/uL)   Monocytes Relative 7  3 - 12 (%)   Monocytes Absolute 0.4  0.1 - 1.0 (K/uL)   Eosinophils Relative 1  0 - 5 (%)   Eosinophils Absolute 0.1  0.0 - 0.7 (K/uL)   Basophils Relative 1  0 - 1 (%)   Basophils Absolute 0.0  0.0 - 0.1 (K/uL)  COMPREHENSIVE METABOLIC PANEL     Status: Abnormal   Collection Time   03/26/11  8:40 AM      Component Value Range   Sodium 139  135 - 145 (mEq/L)   Potassium 3.8  3.5 - 5.1 (mEq/L)   Chloride 104  96 - 112 (mEq/L)   CO2 26  19 - 32 (mEq/L)   Glucose, Bld 164 (*) 70 - 99 (mg/dL)   BUN 13  6 - 23 (mg/dL)   Creatinine, Ser 9.62  0.50 - 1.10  (mg/dL)   Calcium 8.8  8.4 - 95.2 (mg/dL)   Total Protein 6.3  6.0 - 8.3 (g/dL)   Albumin 3.1 (*) 3.5 - 5.2 (g/dL)   AST 30  0 - 37 (U/L)   ALT 27  0 - 35 (U/L)   Alkaline Phosphatase 116  39 - 117 (U/L)   Total Bilirubin 0.3  0.3 - 1.2 (mg/dL)   GFR calc non Af Amer 79 (*) >90 (mL/min)   GFR calc Af Amer >90  >90 (mL/min)  MAGNESIUM     Status: Normal   Collection Time   03/26/11  8:40 AM      Component Value Range   Magnesium 1.8  1.5 - 2.5 (mg/dL)  PROTIME-INR  Status: Normal   Collection Time   03/26/11  8:40 AM      Component Value Range   Prothrombin Time 13.2  11.6 - 15.2 (seconds)   INR 0.98  0.00 - 1.49   APTT     Status: Normal   Collection Time   03/26/11  8:40 AM      Component Value Range   aPTT 26  24 - 37 (seconds)  D-DIMER, QUANTITATIVE     Status: Abnormal   Collection Time   03/26/11  8:40 AM      Component Value Range   D-Dimer, Quant 2.23 (*) 0.00 - 0.48 (ug/mL-FEU)  HEPARIN LEVEL (UNFRACTIONATED)     Status: Abnormal   Collection Time   03/26/11  9:24 AM      Component Value Range   Heparin Unfractionated <0.10 (*) 0.30 - 0.70 (IU/mL)  TROPONIN I     Status: Normal   Collection Time   03/26/11  9:25 AM      Component Value Range   Troponin I <0.30  <0.30 (ng/mL)  CK TOTAL AND CKMB     Status: Abnormal   Collection Time   03/26/11  9:25 AM      Component Value Range   Total CK 111  7 - 177 (U/L)   CK, MB 3.6  0.3 - 4.0 (ng/mL)   Relative Index 3.2 (*) 0.0 - 2.5   PRO B NATRIURETIC PEPTIDE     Status: Abnormal   Collection Time   03/26/11  9:29 AM      Component Value Range   Pro B Natriuretic peptide (BNP) 3751.0 (*) 0 - 450 (pg/mL)  URINALYSIS, ROUTINE W REFLEX MICROSCOPIC     Status: Abnormal   Collection Time   03/26/11  9:53 AM      Component Value Range   Color, Urine YELLOW  YELLOW    APPearance CLOUDY (*) CLEAR    Specific Gravity, Urine 1.022  1.005 - 1.030    pH 7.5  5.0 - 8.0    Glucose, UA NEGATIVE  NEGATIVE (mg/dL)   Hgb  urine dipstick TRACE (*) NEGATIVE    Bilirubin Urine NEGATIVE  NEGATIVE    Ketones, ur 15 (*) NEGATIVE (mg/dL)   Protein, ur 562 (*) NEGATIVE (mg/dL)   Urobilinogen, UA 1.0  0.0 - 1.0 (mg/dL)   Nitrite NEGATIVE  NEGATIVE    Leukocytes, UA NEGATIVE  NEGATIVE   URINE MICROSCOPIC-ADD ON     Status: Normal   Collection Time   03/26/11  9:53 AM      Component Value Range   Squamous Epithelial / LPF RARE  RARE    WBC, UA 0-2  <3 (WBC/hpf)   RBC / HPF 0-2  <3 (RBC/hpf)   Urine-Other LESS THAN 10 mL OF URINE SUBMITTED    MRSA PCR SCREENING     Status: Normal   Collection Time   03/26/11  5:54 PM      Component Value Range   MRSA by PCR NEGATIVE  NEGATIVE   CARDIAC PANEL(CRET KIN+CKTOT+MB+TROPI)     Status: Abnormal   Collection Time   03/26/11  6:18 PM      Component Value Range   Total CK 112  7 - 177 (U/L)   CK, MB 3.9  0.3 - 4.0 (ng/mL)   Troponin I <0.30  <0.30 (ng/mL)   Relative Index 3.5 (*) 0.0 - 2.5   HEPARIN LEVEL (UNFRACTIONATED)     Status: Abnormal   Collection  Time   03/26/11  7:30 PM      Component Value Range   Heparin Unfractionated 0.28 (*) 0.30 - 0.70 (IU/mL)  CARDIAC PANEL(CRET KIN+CKTOT+MB+TROPI)     Status: Abnormal   Collection Time   03/27/11 12:00 AM      Component Value Range   Total CK 100  7 - 177 (U/L)   CK, MB 3.6  0.3 - 4.0 (ng/mL)   Troponin I <0.30  <0.30 (ng/mL)   Relative Index 3.6 (*) 0.0 - 2.5   CBC     Status: Abnormal   Collection Time   03/27/11  5:30 AM      Component Value Range   WBC 5.6  4.0 - 10.5 (K/uL)   RBC 3.84 (*) 3.87 - 5.11 (MIL/uL)   Hemoglobin 9.6 (*) 12.0 - 15.0 (g/dL)   HCT 14.7 (*) 82.9 - 46.0 (%)   MCV 79.7  78.0 - 100.0 (fL)   MCH 25.0 (*) 26.0 - 34.0 (pg)   MCHC 31.4  30.0 - 36.0 (g/dL)   RDW 56.2  13.0 - 86.5 (%)   Platelets 249  150 - 400 (K/uL)  HEPARIN LEVEL (UNFRACTIONATED)     Status: Normal   Collection Time   03/27/11  5:30 AM      Component Value Range   Heparin Unfractionated 0.63  0.30 - 0.70  (IU/mL)  CARDIAC PANEL(CRET KIN+CKTOT+MB+TROPI)     Status: Normal   Collection Time   03/27/11  5:30 AM      Component Value Range   Total CK 97  7 - 177 (U/L)   CK, MB 3.5  0.3 - 4.0 (ng/mL)   Troponin I <0.30  <0.30 (ng/mL)   Relative Index RELATIVE INDEX IS INVALID  0.0 - 2.5   PROTIME-INR     Status: Normal   Collection Time   03/27/11  5:30 AM      Component Value Range   Prothrombin Time 15.0  11.6 - 15.2 (seconds)   INR 1.16  0.00 - 1.49     Intake/Output Summary (Last 24 hours) at 03/27/11 0731 Last data filed at 03/27/11 0600  Gross per 24 hour  Intake   55.5 ml  Output   2365 ml  Net -2309.5 ml    ASSESSMENT AND PLAN:   1) Dizziness:  Check orthostatics.     2) CAD:  Enzymes negative.  No evidence of acute coronary syndrome.     3) CHF:  Good urine output.  Echo pending.  Continue diuretic changing to po with a BMET.    Rollene Rotunda 03/27/2011 7:31 AM

## 2011-03-27 NOTE — Progress Notes (Signed)
Patient found to be up walking around room, she had discontinued her foley catheter and left it in the bathroom, very agitated and confused, son in room periodically, bed alarm was on and son was out of room when patient got out of bed. No obvious injury to urethra, no complaints of pain. Dr. Mahala Menghini notified. Patient had already received 1 mg of Haldol IV.

## 2011-03-27 NOTE — Progress Notes (Signed)
ANTICOAGULATION CONSULT NOTE - Follow Up Consult  Pharmacy Consult for Heparin Indication: chest pain/ACS  Allergies  Allergen Reactions  . Hyoscyamine   . Ivp Dye (Iodinated Diagnostic Agents)     Patient Measurements: Height: 5\' 6"  (167.6 cm) Weight: 190 lb 11.2 oz (86.5 kg) IBW/kg (Calculated) : 59.3  Adjusted Body Weight:   Vital Signs: Temp: 97.6 F (36.4 C) (12/17 0400) Temp src: Oral (12/17 0400) BP: 130/70 mmHg (12/17 0400) Pulse Rate: 94  (12/16 2000)  Labs:  Basename 03/27/11 0530 03/27/11 03/26/11 1930 03/26/11 1818 03/26/11 0924 03/26/11 0840  HGB 9.6* -- -- -- -- 10.7*  HCT 30.6* -- -- -- -- 34.2*  PLT 249 -- -- -- -- 277  APTT -- -- -- -- -- 26  LABPROT 15.0 -- -- -- -- 13.2  INR 1.16 -- -- -- -- 0.98  HEPARINUNFRC 0.63 -- 0.28* -- <0.10* --  CREATININE -- -- -- -- -- 0.67  CKTOTAL 97 100 -- 112 -- --  CKMB 3.5 3.6 -- 3.9 -- --  TROPONINI <0.30 <0.30 -- <0.30 -- --   Estimated Creatinine Clearance: 59 ml/min (by C-G formula based on Cr of 0.67).    Assessment: Pt with heparin for ACS currently running at 1050units/hr per RN, no issues/bleeding per RN. Goal of Therapy:  Heparin level 0.3-0.7   Plan:  Continue with current rate, recheck level at 1300.  Darlina Guys, Jacquenette Shone Crowford 03/27/2011,6:15 AM

## 2011-03-27 NOTE — Progress Notes (Signed)
  Echocardiogram 2D Echocardiogram has been performed.  Jowell Bossi Lynn Patterson Hollenbaugh, RDCS 03/27/2011, 9:11 AM

## 2011-03-27 NOTE — Progress Notes (Signed)
Updated patients son on the plan of care for Jenny Brady. Patient stable with no complaints.

## 2011-03-27 NOTE — Progress Notes (Signed)
[facility] progress note    Interval h/o:- 77 y/o CF admit with vague symptoms, but found to have EKG changes with possible NSTEMI vs LVH and repolarization changes on EKG. IV heparin started in ED. Cardiac enzymes neg.  V/Q scan low probability. Responded well to IV lasix. Cardiologist Dr. Antoine Poche saw pt on admit  Subjective: Well.  Perseveres on Stents being placed/Thyroid issues No persisting CP/SOB Doesn't c/o any dizzyness Treatment Team:  [facility] Objective: Vital signs in last 24 hours: Temp:  [97.4 F (36.3 C)-98.3 F (36.8 C)] 97.6 F (36.4 C) (12/17 0400) Pulse Rate:  [94-110] 94  (12/16 2000) Resp:  [10-22] 10  (12/16 2000) BP: (118-156)/(62-85) 130/70 mmHg (12/17 0400) SpO2:  [93 %-99 %] 98 % (12/16 2000) Weight:  [86.5 kg (190 lb 11.2 oz)] 190 lb 11.2 oz (86.5 kg) (12/16 1800) Weight change:   Intake/Output Summary (Last 24 hours) at 03/27/11 0729 Last data filed at 03/27/11 0600  Gross per 24 hour  Intake   55.5 ml  Output   2365 ml  Net -2309.5 ml    BP 130/70  Pulse 94  Temp(Src) 97.6 F (36.4 C) (Oral)  Resp 10  Ht 5\' 6"  (1.676 m)  Wt 86.5 kg (190 lb 11.2 oz)  BMI 30.78 kg/m2  SpO2 98% General appearance: cooperative and appears stated age Ears: normal TM's and external ear canals both ears and nad Throat: lips, mucosa, and tongue normal; teeth and gums normal Neck: no adenopathy, no carotid bruit, no JVD, supple, symmetrical, trachea midline and thyroid not enlarged, symmetric, no tenderness/mass/nodules Heart: regular rate and rhythm, S1, S2 normal, no murmur, click, rub or gallop-Tele= Extremities: extremities normal, atraumatic, no cyanosis or edema Pulses: 2+ and symmetric Neurologic: Grossly normal  Lab Results:  [facility] 03/26/11 0840  NA 139  K 3.8  CL 104  CO2 26  GLUCOSE 164*  BUN 13  CREATININE 0.67  CALCIUM 8.8  MG 1.8  PHOS --    Basename 03/26/11 0840  AST 30  ALT 27  ALKPHOS 116  BILITOT  0.3  PROT 6.3  ALBUMIN 3.1*   No results found for this basename: LIPASE:2,AMYLASE:2 in the last 72 hours  Basename 03/27/11 0530 03/26/11 0840  WBC 5.6 5.4  NEUTROABS -- 3.7  HGB 9.6* 10.7*  HCT 30.6* 34.2*  MCV 79.7 81.2  PLT 249 277    Basename 03/27/11 0530 03/27/11 03/26/11 1818  CKTOTAL 97 100 112  CKMB 3.5 3.6 3.9  CKMBINDEX -- -- --  TROPONINI <0.30 <0.30 <0.30   No components found with this basename: POCBNP:3  Basename 03/26/11 0840  DDIMER 2.23*    Micro Results: Recent Results (from the past 240 hour(s))  MRSA PCR SCREENING     Status: Normal   Collection Time   03/26/11  5:54 PM      Component Value Range Status Comment   MRSA by PCR NEGATIVE  NEGATIVE  Final     Medications: I have reviewed the patient's current medications. Scheduled Meds:   . aspirin  324 mg Oral Once  . aspirin  324 mg Oral NOW   Or  . aspirin  300 mg Rectal NOW  . aspirin EC  81 mg Oral Daily  . carvedilol  3.125 mg Oral BID WC  . cholecalciferol  1,000 Units Oral Daily  . famotidine  20 mg Oral Daily  . furosemide  40 mg Intravenous BID  . haloperidol lactate  2 mg Intravenous Once  . heparin  1,000 Units Intravenous Once  . heparin  4,000 Units Intravenous Once  . levothyroxine  56 mcg Oral Daily  . methylPREDNISolone sodium succinate      . morphine      .  morphine injection  4 mg Intravenous Once  . nitroGLYCERIN      . DISCONTD: aspirin EC  81 mg Oral Daily   Continuous Infusions:   . heparin 10.5 mL/hr (03/27/11 0600)   PRN Meds:.acetaminophen, meclizine, nitroGLYCERIN, ondansetron (ZOFRAN) IV, technetium albumin aggregated, xenon xe 133, DISCONTD: nitroGLYCERIN   Assessment/Plan:  Patient Active Hospital Problem List: CP-Pe vs ACS (03/26/2011) Assessment:Likely PE Or ACS  CE's done wnl-V/Q neg  Echocardiogram pending-likley all realted to CHF-off load her with lasix 40 iv-->60 po qd  cont low dose BB as well right now-might need low dose ACE  D/c IV  heparin Gtt Started by ED Transfer Tele Bmet this am P   ?CHF-BNP in the past less than 30, now above 3700. Will likely require a diuretic as per above discussion   DEMENTIA-mod-severe (02/19/2008) Assessment: Moderate-needs re-orientation and reassesement as outpt  CAD (?stent) cath 90's Joe Fifty-Six (11/03/2006) Assessment: See above-I have searched through E-chart without success of ever finding any report of prior cardiac issues.   Hyperthyroidism-s/p RAI admin () Assessment-seems stabilized and has seen Dr. Alyse Low of endocrine for management of this. Will get a TSH to ensure no other overt cause for CP like a thyroid storm, given the fact that she needed more than 1 episode of therapy with RAI for treatment of this.   GERD (gastroesophageal reflux disease) () Assessment: Has a h/o dyspahgia, and also of Decreased gastric motility as well  Dispo-Tele floor Diurese, address renal function-d/c 1-2 days if all stable    LOS: 1 day   [facility] 03/27/2011, 7:29 AM

## 2011-03-28 DIAGNOSIS — I509 Heart failure, unspecified: Secondary | ICD-10-CM

## 2011-03-28 DIAGNOSIS — I5023 Acute on chronic systolic (congestive) heart failure: Secondary | ICD-10-CM | POA: Diagnosis present

## 2011-03-28 LAB — BASIC METABOLIC PANEL
BUN: 15 mg/dL (ref 6–23)
Calcium: 8.6 mg/dL (ref 8.4–10.5)
GFR calc Af Amer: 88 mL/min — ABNORMAL LOW (ref >90)
GFR calc non Af Amer: 76 mL/min — ABNORMAL LOW (ref >90)
Potassium: 3.3 mEq/L — ABNORMAL LOW (ref 3.5–5.1)

## 2011-03-28 LAB — CBC
HCT: 30.2 % — ABNORMAL LOW (ref 36.0–46.0)
MCHC: 31.5 g/dL (ref 30.0–36.0)
RDW: 14.5 % (ref 11.5–15.5)

## 2011-03-28 MED ORDER — CARVEDILOL 6.25 MG PO TABS
6.2500 mg | ORAL_TABLET | Freq: Two times a day (BID) | ORAL | Status: DC
  Administered 2011-03-28 – 2011-04-05 (×11): 6.25 mg via ORAL
  Administered 2011-04-05: 3.125 mg via ORAL
  Administered 2011-04-06 (×2): 6.25 mg via ORAL
  Filled 2011-03-28 (×21): qty 1

## 2011-03-28 MED ORDER — ENALAPRIL MALEATE 2.5 MG PO TABS
2.5000 mg | ORAL_TABLET | Freq: Every day | ORAL | Status: DC
  Administered 2011-03-28 – 2011-04-06 (×10): 2.5 mg via ORAL
  Filled 2011-03-28 (×11): qty 1

## 2011-03-28 NOTE — Progress Notes (Signed)
[facility] progress note    Interval h/o:-  77 y/o CF admit with vague symptoms, but found to have EKG changes with possible NSTEMI vs LVH and repolarization changes on EKG. IV heparin started in ED. Cardiac enzymes neg. V/Q scan Brady probability. Responded well to IV lasix.  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  Replaced her home dosages of her Xanx and seems to have calmed significantly Cannot give me an approp ROS Treatment Team:  [facility] Objective: Vital signs in last 24 hours: Temp:  [97.5 F (36.4 C)-98.8 F (37.1 C)] 98.8 F (37.1 C) (12/18 0520) Pulse Rate:  [90-109] 109  (12/18 0937) Resp:  [20-22] 22  (12/18 0520) BP: (101-133)/(63-80) 133/80 mmHg (12/18 0938) SpO2:  [91 %-100 %] 92 % (12/18 0520) Weight change: -1.3 kg (-2 lb 13.9 oz)  Intake/Output Summary (Last 24 hours) at 03/28/11 1115 Last data filed at 03/28/11 0941  Gross per 24 hour  Intake      0 ml  Output   2150 ml  Net  -2150 ml    BP 133/80  Pulse 109  Temp(Src) 98.8 F (37.1 C) (Axillary)  Resp 22  Ht 5\' 6"  (1.676 m)  Wt 85.2 kg (187 lb 13.3 oz)  BMI 30.32 kg/m2  SpO2 92% General appearance: cooperative at present-sleepy and appears stated age Struggles to tell me where she is, or her own DOB Throat: lips, mucosa, and tongue normal; teeth and gums normal  Neck: no adenopathy, no carotid bruit, no JVD, supple, symmetrical, trachea midline and thyroid not enlarged, symmetric, no tenderness/mass/nodules  Heart: regular rate and rhythm, S1, S2 normal, no murmur, click, rub or gallop-Tele= NSR PVC's with some tachycardia overnight. Extremities: extremities normal, atraumatic, no cyanosis or edema  Pulses: 2+ and symmetric  Neurologic: Grossly  normal   Lab Results:  Basename 03/28/11 0435 03/27/11 0537 03/26/11 0840  NA 139 140 --  K 3.3* 3.8 --  CL 104 103 --  CO2 30 28 --  GLUCOSE 127* 128* --  BUN 15 15 --  CREATININE 0.76 0.75 --  CALCIUM 8.6 8.9 --  MG -- -- 1.8  PHOS -- -- --    Basename 03/26/11 0840  AST 30  ALT 27  ALKPHOS 116  BILITOT 0.3  PROT 6.3  ALBUMIN 3.1*     Basename 03/28/11 0435 03/27/11 0530 03/26/11 0840  WBC 4.2 5.6 --  NEUTROABS -- -- 3.7  HGB 9.5* 9.6* --  HCT 30.2* 30.6* --  MCV 79.1 79.7 --  PLT 232 249 --    Basename 03/27/11 0530 03/27/11 03/26/11 1818  CKTOTAL 97 100 112  CKMB 3.5 3.6 3.9  CKMBINDEX -- -- --  TROPONINI <0.30 <0.30 <0.30     Basename 03/26/11 0840  DDIMER 2.23*   No results found for this basename: HGBA1C:2 in the last 72 hours  Basename 03/27/11 0530  CHOL 146  HDL 57  LDLCALC 77  TRIG 60  CHOLHDL 2.6  LDLDIRECT --   Micro Results: Recent Results (from the past 240 hour(s))  MRSA PCR SCREENING     Status: Normal   Collection Time   03/26/11  5:54 PM      Component Value Range Status Comment   MRSA by PCR NEGATIVE  NEGATIVE  Final    Medications: I have reviewed the patient's current medications. Scheduled Meds:   . aspirin EC  81 mg Oral Daily  . carvedilol  3.125 mg Oral BID WC  . cholecalciferol  1,000 Units Oral Daily  . enalapril  2.5 mg Oral Daily  . famotidine  20 mg Oral Daily  . furosemide  60 mg Oral Daily  . levothyroxine  56 mcg Oral Daily  .  morphine injection  4 mg Intravenous Once  . DISCONTD: aspirin EC  81 mg Oral Daily   Continuous Infusions:  PRN Meds:.acetaminophen, ALPRAZolam, haloperidol lactate, LORazepam, meclizine, nitroGLYCERIN, ondansetron (ZOFRAN) IV   Assessment/Plan: ?CHF, ruled out PE/ACS-BNP in the past less than 30, now above 3700. Will likely require a diuretic as per above discussion  CE's done wnl-V/Q neg  Echocardiogram relted to CHF-off loaded her with lasix 40 iv-->changed to lasix 60  po qd 12.18  increased Brady dose Coreg 3.125>>6.25 bid as well right now -might need Brady dose ACE   Transferred to tele 12.18 Tele   DEMENTIA-mod-severe (02/19/2008) Assessment: Moderate-severe-agitated overnight -needs re-orientation and reassesement-started back home Xanax 0.5 tid, prn Ativan on as well  CAD (?stent) cath 90's Jenny Brady (11/03/2006) Assessment: See above-I have searched through E-chart without success of ever finding any report of prior cardiac issues.   Hyperthyroidism-s/p RAI admin () Assessment-seems stabilized and has seen Dr. Alyse Brady of endocrine for management of this. Will get a TSH to ensure no other overt cause for CP like a thyroid storm, given the fact that she needed more than 1 episode of therapy with RAI for treatment of this.   GERD (gastroesophageal reflux disease) () Assessment: Has a h/o dyspahgia, and also of Decreased gastric motility as well  Dispo-Tele floor  Diurese, address renal function-d/c 1-2 days if cleared by PT/OT    I have fully updatedSon Jenny Brady as to POC.  I explained she might have had a cardiac event-he states she usually "cries wolf" and in the past has usually when she has had c/o taken her for a car-rides to determine if she would persist in saying the things that she complains of I have told him that it is unlikely she would be a candidate for any significant invasive Cardiac work-up HE states there has never been a significant discussion about future plans if she was ever ill I have encouraged him to let us get Pt/Ot to assess her--If she is at her functional baseline, a further cardiology opinion can be sought as an outpatient      LOS: 2 days   [name] 03/28/2011, 11:15 AM

## 2011-03-28 NOTE — Progress Notes (Signed)
SUBJECTIVE:  She is asleep this am and I could not wake her.  She had sedation last night and apparently was very agitated and combative.   PHYSICAL EXAM Filed Vitals:   03/27/11 1535 03/27/11 1600 03/27/11 2300 03/28/11 0520  BP:   101/63 107/63  Pulse:   96 90  Temp: 97.6 F (36.4 C) 97.5 F (36.4 C) 98.8 F (37.1 C) 98.8 F (37.1 C)  TempSrc: Oral Oral Axillary Axillary  Resp:   20 22  Height:      Weight:      SpO2: 100%  91% 92%   General:  No distress Lungs:  Few scattered crackles Heart:  RRR Abdomen:  Positive bowel sounds, no rebound no guarding Extremities:  No edema. Neuro:   She is moving all extremities but is not awake this am.  LABS: Lab Results  Component Value Date   CKTOTAL 97 03/27/2011   CKMB 3.5 03/27/2011   TROPONINI <0.30 03/27/2011   Results for orders placed during the hospital encounter of 03/26/11 (from the past 24 hour(s))  CBC     Status: Abnormal   Collection Time   03/28/11  4:35 AM      Component Value Range   WBC 4.2  4.0 - 10.5 (K/uL)   RBC 3.82 (*) 3.87 - 5.11 (MIL/uL)   Hemoglobin 9.5 (*) 12.0 - 15.0 (g/dL)   HCT 16.1 (*) 09.6 - 46.0 (%)   MCV 79.1  78.0 - 100.0 (fL)   MCH 24.9 (*) 26.0 - 34.0 (pg)   MCHC 31.5  30.0 - 36.0 (g/dL)   RDW 04.5  40.9 - 81.1 (%)   Platelets 232  150 - 400 (K/uL)  BASIC METABOLIC PANEL     Status: Abnormal   Collection Time   03/28/11  4:35 AM      Component Value Range   Sodium 139  135 - 145 (mEq/L)   Potassium 3.3 (*) 3.5 - 5.1 (mEq/L)   Chloride 104  96 - 112 (mEq/L)   CO2 30  19 - 32 (mEq/L)   Glucose, Bld 127 (*) 70 - 99 (mg/dL)   BUN 15  6 - 23 (mg/dL)   Creatinine, Ser 9.14  0.50 - 1.10 (mg/dL)   Calcium 8.6  8.4 - 78.2 (mg/dL)   GFR calc non Af Amer 76 (*) >90 (mL/min)   GFR calc Af Amer 88 (*) >90 (mL/min)    Intake/Output Summary (Last 24 hours) at 03/28/11 0740 Last data filed at 03/28/11 0521  Gross per 24 hour  Intake      0 ml  Output   1950 ml  Net  -1950 ml     ASSESSMENT AND PLAN:   1) Dizziness:  This apparently has been a chronic problem.  We ordered orthostatic blood pressures.  I am having a hard time finding these results in the chart.     2) CAD:  Enzymes negative.  No evidence of acute coronary syndrome.     3) CHF:  Good urine output.  EF 25 - 30% with severe regional wall motion abnormalities.  There is moderate regurgitation.  TR moderate regurgitation.  This is markedly reduced compared with 2009.  Probably not a recent event but suspect a large MI at some time between the last echo and this. We need to see if her BP will allow a low dose of ACE inhibitor.  We could certainly not do an invasive work up with her current confusion.  We would  also need to have a conversation with her family about this. There is no urgency in this as her enzymes are negative and she has no ongoing pain.  Rollene Rotunda 03/28/2011 7:40 AM

## 2011-03-28 NOTE — Progress Notes (Signed)
03/28/11 0120 Pt is extremely confused and combative. Continuously hitting at and attempting to kick the sitter over the side of the bed rails. Pt has been placed in non-restraint safety mittens due to pulling of foley and IV. Continues to need a safety sitter due to trying to climb out of the bed. Will continue to monitor. Mechele Collin

## 2011-03-28 NOTE — Progress Notes (Signed)
Physical Therapy Evaluation Patient Details Name: Jenny Brady MRN: [id number] DOB: 1926/03/10 Today's Date: 03/28/2011 Time: 4098-1191  Eval II Problem List:  Patient Active Problem List  Diagnoses  . DEMENTIA-mod-severe  . ANXIETY  . CAD (?stent) cath 90's Jenny Brady  . HYPOTENSION  . DUODENITIS  . UNSPECIFIED CYSTITIS  . VERTIGO  . SYMPTOM, MEMORY LOSS  . UNSTEADY GAIT  . Hyperthyroidism-s/p RAI admin  . Coronary artery disease  . GERD (gastroesophageal reflux disease)  . CP-Pe vs ACS  . CHF, acute    Past Medical History:  Past Medical History  Diagnosis Date  . Hyperthyroidism     had RAI-is on thyroid replacement, happened maybe this summer  . Coronary artery disease     Had a catheterization per son by Dr. Glennon Brady in 1991 no reports in  [place]  . GERD (gastroesophageal reflux disease)    Past Surgical History:  Past Surgical History  Procedure Date  . None     PT Assessment/Plan/Recommendation PT Assessment Clinical Impression Statement: Pt presenst with diagnosis of possible CHF. Pt will benefit from skilled PT in the acute care setting to improve functional mobility and safety in preperation for D/C.  PT Recommendation/Assessment: Patient will need skilled PT in the acute care venue PT Problem List: Decreased activity tolerance;Decreased balance;Decreased mobility;Decreased safety awareness;Decreased knowledge of use of DME PT Therapy Diagnosis : Difficulty walking PT Plan PT Treatment/Interventions: DME instruction;Gait training;Functional mobility training;Therapeutic activities;Patient/family education;Balance training PT Recommendation Recommendations for Other Services: OT consult Follow Up Recommendations: 24 hour supervision/assistance;Home health PT (if 24 hour not available, recommend SNF) Equipment Recommended:  (to be determined) PT Goals  Acute Rehab PT Goals PT Goal Formulation: With patient Pt will go Supine/Side to Sit: with  supervision Pt will go Sit to Supine/Side: with supervision Pt will go Sit to Stand: with supervision Pt will Ambulate: 51 - 150 feet;with supervision;with least restrictive assistive device  PT Evaluation Precautions/Restrictions  Precautions Precautions: Fall Prior Functioning  Home Living Lives With: Son Home Adaptive Equipment: Bedside commode/3-in-1 Additional Comments: pt states she did not ambulate with RW PTA. Pt thinks she may have beside commode at home. Son not present during eval Prior Function Comments: Unsure of PLOF-pt having some difficulty answering questions Cognition Cognition Arousal/Alertness: Awake/alert Overall Cognitive Status: No family/caregiver present to determine baseline cognitive functioning Orientation Level: Oriented to person Sensation/Coordination Coordination Gross Motor Movements are Fluid and Coordinated: Yes Extremity Assessment RLE Assessment RLE Assessment: Exceptions to [facility] RLE Strength RLE Overall Strength Comments: Strength > or = 3+/5 with functional activity LLE Assessment LLE Assessment: Exceptions to [facility] LLE Strength LLE Overall Strength Comments: Strength > or = 3+/5 with functional mobility.  Mobility (including Balance) Bed Mobility Bed Mobility: Yes Supine to Sit: 4: Min assist;HOB flat;With rails Supine to Sit Details (indicate cue type and reason): Assist to get trunk to full upright. Increased time.  Sitting - Scoot to Edge of Bed: 3: Mod assist Sitting - Scoot to Edge of Bed Details (indicate cue type and reason): Utilized bed pad for scooting. Increased time. Transfers Transfers: Yes Sit to Stand: From elevated surface;From bed;With upper extremity assist;4: Min assist Sit to Stand Details (indicate cue type and reason): VCs safety, technique, hand placement. Assist to rise, stabilize. Pt's weight shifted posteriorly.  Stand to Sit: 4: Min assist;To chair/3-in-1;With upper extremity assist;With armrests Stand to Sit  Details: VCs safety, technique, hand placement. Assist to control descent.  Ambulation/Gait Ambulation/Gait: Yes Ambulation/Gait Assistance: 3: Mod assist Ambulation/Gait  Assistance Details (indicate cue type and reason): VCs safety, technique, safe use of RW. Unsteady gait requiring external assist to maintain staiblity.  Ambulation Distance (Feet): 130 Feet Assistive device: Rolling walker Gait Pattern: Step-through pattern  Posture/Postural Control Posture/Postural Control: No significant limitations Exercise    End of Session PT - End of Session Equipment Utilized During Treatment: Gait belt Activity Tolerance: Patient tolerated treatment well Patient left: in chair;with call bell in reach (with sitter) General Behavior During Session: [facility] for tasks performed Cognition: Impaired  Jenny Brady 03/28/2011, 4:12 PM [phone]

## 2011-03-29 DIAGNOSIS — R55 Syncope and collapse: Secondary | ICD-10-CM | POA: Diagnosis present

## 2011-03-29 DIAGNOSIS — E0590 Thyrotoxicosis, unspecified without thyrotoxic crisis or storm: Secondary | ICD-10-CM

## 2011-03-29 DIAGNOSIS — E876 Hypokalemia: Secondary | ICD-10-CM | POA: Diagnosis not present

## 2011-03-29 DIAGNOSIS — D649 Anemia, unspecified: Secondary | ICD-10-CM | POA: Diagnosis present

## 2011-03-29 LAB — CBC
MCH: 24.5 pg — ABNORMAL LOW (ref 26.0–34.0)
MCHC: 31.3 g/dL (ref 30.0–36.0)
Platelets: 249 10*3/uL (ref 150–400)
RBC: 4.17 MIL/uL (ref 3.87–5.11)
RDW: 14.3 % (ref 11.5–15.5)

## 2011-03-29 LAB — BASIC METABOLIC PANEL
CO2: 28 mEq/L (ref 19–32)
Calcium: 8.8 mg/dL (ref 8.4–10.5)
GFR calc Af Amer: 87 mL/min — ABNORMAL LOW (ref >90)
GFR calc non Af Amer: 75 mL/min — ABNORMAL LOW (ref >90)
Sodium: 140 mEq/L (ref 135–145)

## 2011-03-29 MED ORDER — FUROSEMIDE 40 MG PO TABS
40.0000 mg | ORAL_TABLET | Freq: Every day | ORAL | Status: DC
  Administered 2011-03-30 – 2011-04-06 (×8): 40 mg via ORAL
  Filled 2011-03-29 (×9): qty 1

## 2011-03-29 MED ORDER — POTASSIUM CHLORIDE CRYS ER 20 MEQ PO TBCR
20.0000 meq | EXTENDED_RELEASE_TABLET | Freq: Two times a day (BID) | ORAL | Status: DC
  Administered 2011-03-29 – 2011-04-06 (×15): 20 meq via ORAL
  Filled 2011-03-29 (×20): qty 1

## 2011-03-29 MED ORDER — POTASSIUM CHLORIDE CRYS ER 20 MEQ PO TBCR
40.0000 meq | EXTENDED_RELEASE_TABLET | Freq: Once | ORAL | Status: AC
  Administered 2011-03-29: 40 meq via ORAL
  Filled 2011-03-29: qty 2

## 2011-03-29 NOTE — Progress Notes (Signed)
PATIENT DETAILS Name: Jenny Brady Age: 77 y.o. Sex: female Date of Birth: [birth year] Admit Date: 03/26/2011 [id number], Jenny Ammons, MD, MD  CONSULTS: 1.  Dr. Rollene Rotunda, Cardiology  Interval History: 77 y/o CF admitted with vague symptoms, but found to have EKG changes with possible NSTEMI vs LVH and repolarization changes on EKG. IV heparin started in ED but d/c'd after cardiac markers were cycled and found to be negative. V/Q scan low probability for pulmonary embolism. Responded well to IV lasix. Echo was done and showed severe wall motion anomaly, not likely candidate for operative or invasive therapy given her comorbidities. She appears to have advanced dementia and has remained agitated, and does have hoarding behavior and some confusion per her son, Jenny Brady. The plans at this point are for medical management. A PT/OT evaluation was done to determine her disposition, and at this point, 24-hour supervision or skilled nursing home placement is recommended.  ROS: [name] denies chest pain and dyspnea. She is sitting up in a chair and indicates that she is willing to go to rehabilitation for further physical therapy.   Objective: Vital signs in last 24 hours: Temp:  [97.8 F (36.6 C)-99.6 F (37.6 C)] 97.8 F (36.6 C) (12/19 0448) Pulse Rate:  [86-97] 97  (12/19 0448) Resp:  [20] 20  (12/19 0448) BP: (95-117)/(60-73) 96/60 mmHg (12/19 0918) SpO2:  [92 %] 92 % (12/19 0448) Weight:  [80.6 kg (177 lb 11.1 oz)] 177 lb 11.1 oz (80.6 kg) (12/19 0448) Weight change: -4.6 kg (-10 lb 2.3 oz) Last BM Date: 03/25/11  Intake/Output from previous day:  Intake/Output Summary (Last 24 hours) at 03/29/11 1036 Last data filed at 03/29/11 0449  Gross per 24 hour  Intake    440 ml  Output   3826 ml  Net  -3386 ml     Physical Exam:  Gen:  No acute distress. Cardiovascular:  Heart sounds regular with no murmurs, rubs, or gallops. Respiratory: Lungs clear to auscultation  bilaterally. Gastrointestinal: Abdomen soft, nontender, nondistended with normal active bowel sounds. Extremities: No clubbing, edema, or cyanosis.   Lab Results: Basic Metabolic Panel:  Lab 03/29/11 0981 03/28/11 0435 03/27/11 0537 03/26/11 0840  NA 140 139 140 139  K 3.3* 3.3* -- --  CL 102 104 103 104  CO2 28 30 28 26   GLUCOSE 132* 127* 128* 164*  BUN 15 15 15 13   CREATININE 0.79 0.76 0.75 0.67  CALCIUM 8.8 8.6 8.9 8.8  MG -- -- -- 1.8  PHOS -- -- -- --   GFR Estimated Creatinine Clearance: 57 ml/min (by C-G formula based on Cr of 0.79). Liver Function Tests:  Lab 03/26/11 0840  AST 30  ALT 27  ALKPHOS 116  BILITOT 0.3  PROT 6.3  ALBUMIN 3.1*   Coagulation profile  Lab 03/27/11 0530 03/26/11 0840  INR 1.16 0.98  PROTIME -- --    CBC:  Lab 03/29/11 0456 03/28/11 0435 03/27/11 0530 03/26/11 0840  WBC 5.5 4.2 5.6 5.4  NEUTROABS -- -- -- 3.7  HGB 10.2* 9.5* 9.6* 10.7*  HCT 32.6* 30.2* 30.6* 34.2*  MCV 78.2 79.1 79.7 81.2  PLT 249 232 249 277   Cardiac Enzymes:  Lab 03/27/11 0530 03/27/11 03/26/11 1818 03/26/11 0925  CKTOTAL 97 100 112 111  CKMB 3.5 3.6 3.9 3.6  CKMBINDEX -- -- -- --  TROPONINI <0.30 <0.30 <0.30 <0.30   Lipid Profile  Basename 03/27/11 0530  CHOL 146  HDL 57  LDLCALC 77  TRIG 60  CHOLHDL 2.6  LDLDIRECT --    Ref. Range 03/26/2011 09:29  Pro B Natriuretic peptide (BNP) Latest Range: 0-450 pg/mL 3751.0 (H)   Microbiology Recent Results (from the past 240 hour(s))  MRSA PCR SCREENING     Status: Normal   Collection Time   03/26/11  5:54 PM      Component Value Range Status Comment   MRSA by PCR NEGATIVE  NEGATIVE  Final     Recent Results (from the past 240 hour(s))  MRSA PCR SCREENING     Status: Normal   Collection Time   03/26/11  5:54 PM      Component Value Range Status Comment   MRSA by PCR NEGATIVE  NEGATIVE  Final     Studies/Results: 1.  CXR 03/26/11: IMPRESSION: Cardiomegaly and pulmonary edema. Confluent  edema versus infectious infiltrate in the left upper  Lobe. 2.  VQ Scan 03/25/22: IMPRESSION: Low probability for acute pulmonary embolus. 3.  2-D Echocardiogram 03/26/22 Study Conclusions  - Left ventricle: The cavity size was mildly dilated. Wall thickness was normal. Systolic function was severely reduced. The estimated ejection fraction was in the range of 25% to 30%. The anteroseptal and inferoseptal walls appear akinetic. The anterior and inferior walls appear severely hypokinetic. The lateral wall is relatively preserved. Doppler parameters are consistent with restrictive physiology, indicative of decreased left ventricular diastolic compliance and/or increased left atrial pressure. E/medial e' > 15 suggests LV end diastolic pressure at least 20 mmHg. - Aortic valve: There was no stenosis. - Mitral valve: Moderately calcified annulus. Moderate regurgitation. - Left atrium: The atrium was moderately dilated. - Right ventricle: The cavity size was normal. Systolic function was normal. - Right atrium: The atrium was mildly dilated. - Tricuspid valve: Moderate regurgitation. Peak RV-RA gradient:50mm Hg (S). - Pulmonary arteries: PA systolic pressure 65-69 mmHg. - Systemic veins: IVC measured 2.3 cm with normal respirophasic variation, suggesting RA pressure 6-10 mmHg. - Pericardium, extracardiac: There was a pleural effusion. Impressions:  - Mildly dilated LV with severely reduced systolic function, EF 25-30%. Wall motion abnormalities as listed above suggesting ischemic cardiomyopathy. Severe diastolic dysfunction with evidence for elevated LV filling pressure. Moderate to severe pulmonary hypertension. Normal RV size and systolic function. Moderate MR, moderate TR.     Medications: Scheduled Meds:   . aspirin EC  81 mg Oral Daily  . carvedilol  6.25 mg Oral BID WC  . cholecalciferol  1,000 Units Oral Daily  . enalapril  2.5 mg Oral Daily  . famotidine  20 mg  Oral Daily  . furosemide  40 mg Oral Daily  . levothyroxine  56 mcg Oral Daily  .  morphine injection  4 mg Intravenous Once  . potassium chloride  40 mEq Oral Once  . DISCONTD: carvedilol  3.125 mg Oral BID WC  . DISCONTD: furosemide  60 mg Oral Daily   Continuous Infusions:  PRN Meds:.acetaminophen, ALPRAZolam, LORazepam, meclizine, nitroGLYCERIN, ondansetron (ZOFRAN) IV, DISCONTD: haloperidol lactate Antibiotics: Anti-infectives    None       Assessment/Plan:  Principal Problem:  *Systolic CHF, acute on chronic The patient was admitted and a diagnostic evaluation undertaken. An initial chest radiograph showed pulmonary edema and her pro BNP levels were elevated. Cardiology was subsequently consult and and a two-dimensional echocardiogram was obtained which showed severe depression of LV function. She was put on diuretic therapy initially with subsequent improvement in her symptoms. An ACE inhibitor was added on 03/28/2011. Her Lasix dose was reduced today.  Cardiology indicates that there are no further plans to perform invasive studies given her comorbidities and that she should remain medically optimized. We'll check a pro BNP in the morning. Active Problems:  Syncope The patient's initial presenting complaint was one of syncope. Orthostatics were ordered but these cannot be found in the chart. Will check them again today. A VQ scan was done which showed low probability for pulmonary embolism. A PT/OT evaluation indicates that the patient should have 24-hour supervision in the home or be placed in a skilled nursing home facility.  DEMENTIA-mod-severe The patient has required Haldol for agitation and has exhibited paranoia and sundowning type behavior. She is currently calm.  CP-Pe vs ACS No evidence of a pulmonary embolism or acute coronary syndrome based on VQ scan and cardiac markers. Patient's chest pain was vague and was likely due to other causes including decompensated heart  failure.  CAD (?stent) cath 90's [name] Patient had negative cardiac markers. No further invasive testing as recommended by cardiology. Continue aspirin and beta blocker therapy. Patient's lipids are well-controlled.  Hyperthyroidism-s/p RAI admin The patient remains on Synthroid for thyroid hormone replacement therapy. We'll check a TSH to ensure she is appropriately replaced.  GERD (gastroesophageal reflux disease) The patient has been maintained on Pepcid for symptom control while in the hospital.  Normocytic anemia Likely secondary to chronic kidney disease.  Stage III chronic kidney disease The patient's renal function is currently stable.  Hypokalemia Secondary to diuretic therapy. Patient's potassium will be replaced.   LOS: 3 days   Hillery Aldo, MD Pager [phone]  03/29/2011, 10:36 AM

## 2011-03-29 NOTE — Progress Notes (Signed)
    SUBJECTIVE:  She is awake today and says she "Hurts all over."  She is not having any SOB or chest pain.  PHYSICAL EXAM Filed Vitals:   03/28/11 1324 03/28/11 1627 03/28/11 2141 03/29/11 0448  BP: 107/70 117/73 95/64 112/72  Pulse: 95 86 92 97  Temp: 99.6 F (37.6 C)  97.8 F (36.6 C) 97.8 F (36.6 C)  TempSrc: Oral  Oral Oral  Resp: 20  20 20   Height:      Weight:    80.6 kg (177 lb 11.1 oz)  SpO2: 92%  92% 92%   General:  No distress Lungs:  Clear Heart:  RRR Abdomen:  Positive bowel sounds, no rebound no guarding Extremities:  No edema.  LABS: Lab Results  Component Value Date   CKTOTAL 97 03/27/2011   CKMB 3.5 03/27/2011   TROPONINI <0.30 03/27/2011   Results for orders placed during the hospital encounter of 03/26/11 (from the past 24 hour(s))  CBC     Status: Abnormal   Collection Time   03/29/11  4:56 AM      Component Value Range   WBC 5.5  4.0 - 10.5 (K/uL)   RBC 4.17  3.87 - 5.11 (MIL/uL)   Hemoglobin 10.2 (*) 12.0 - 15.0 (g/dL)   HCT 16.1 (*) 09.6 - 46.0 (%)   MCV 78.2  78.0 - 100.0 (fL)   MCH 24.5 (*) 26.0 - 34.0 (pg)   MCHC 31.3  30.0 - 36.0 (g/dL)   RDW 04.5  40.9 - 81.1 (%)   Platelets 249  150 - 400 (K/uL)  BASIC METABOLIC PANEL     Status: Abnormal   Collection Time   03/29/11  4:56 AM      Component Value Range   Sodium 140  135 - 145 (mEq/L)   Potassium 3.3 (*) 3.5 - 5.1 (mEq/L)   Chloride 102  96 - 112 (mEq/L)   CO2 28  19 - 32 (mEq/L)   Glucose, Bld 132 (*) 70 - 99 (mg/dL)   BUN 15  6 - 23 (mg/dL)   Creatinine, Ser 9.14  0.50 - 1.10 (mg/dL)   Calcium 8.8  8.4 - 78.2 (mg/dL)   GFR calc non Af Amer 75 (*) >90 (mL/min)   GFR calc Af Amer 87 (*) >90 (mL/min)    Intake/Output Summary (Last 24 hours) at 03/29/11 0907 Last data filed at 03/29/11 0449  Gross per 24 hour  Intake    440 ml  Output   4026 ml  Net  -3586 ml    ASSESSMENT AND PLAN:   1) CAD:  Enzymes negative.  No evidence of acute coronary syndrome.  Continue  medical management    2) CHF:  Good urine output.  EF 25 - 30% with severe regional wall motion abnormalities.  I started ACE inhibitor yesterday.  I will reduce the Lasix today.  I am not planning any invasive studies given her comorbid conditions.     [name] [facility] 03/29/2011 9:07 AM

## 2011-03-29 NOTE — Progress Notes (Signed)
Occupational Therapy Evaluation Patient Details Name: Jenny Brady MRN: [id number] DOB: Aug 07, 1925 Today's Date: 03/29/2011 EV2 [id number] Problem List:  Patient Active Problem List  Diagnoses  . DEMENTIA-mod-severe  . ANXIETY  . CAD (?stent) cath 90's Joe Dalton  . HYPOTENSION  . DUODENITIS  . UNSPECIFIED CYSTITIS  . VERTIGO  . SYMPTOM, MEMORY LOSS  . UNSTEADY GAIT  . Hyperthyroidism-s/p RAI admin  . Coronary artery disease  . GERD (gastroesophageal reflux disease)  . CP-Pe vs ACS  . Systolic CHF, acute on chronic  . Syncope    Past Medical History:  Past Medical History  Diagnosis Date  . Hyperthyroidism     had RAI-is on thyroid replacement, happened maybe this summer  . Coronary artery disease     Had a catheterization per son by Dr. [name] in 1991 no reports in  [place]  . GERD (gastroesophageal reflux disease)    Past Surgical History:  Past Surgical History  Procedure Date  . None     OT Assessment/Plan/Recommendation OT Assessment Clinical Impression Statement: Pt would benefit from skilled OT to maximize ADL I to supervision level for safe d/c home with [facility] and 24/7 supervision. OT Recommendation/Assessment: Patient will need skilled OT in the acute care venue OT Problem List: Decreased safety awareness;Decreased knowledge of use of DME or AE OT Therapy Diagnosis : Generalized weakness OT Plan OT Frequency: Min 2X/week OT Treatment/Interventions: Self-care/ADL training;DME and/or AE instruction;Therapeutic activities;Patient/family education OT Recommendation Follow Up Recommendations: Home health OT;24 hour supervision/assistance (If 24/7 not available, recommend snf) Equipment Recommended: Other (comment) (to be determined.) Individuals Consulted Consulted and Agree with Results and Recommendations: Patient unable/family or caregiver not available OT Goals Acute Rehab OT Goals OT Goal Formulation: Patient unable to participate in goal  setting Time For Goal Achievement: 2 weeks ADL Goals Pt Will Perform Lower Body Bathing: with supervision;Sit to stand from chair;Sit to stand from bed;with adaptive equipment;with cueing (comment type and amount) (with a min amt of VCs to followthrough w/ all func tasks) ADL Goal: Lower Body Bathing - Progress: Progressing toward goals Pt Will Perform Lower Body Dressing: with supervision;Sit to stand from chair;Sit to stand from bed;with adaptive equipment;with cueing (comment type and amount) (with a min amt of VCs to followthrough w/ all func tasks) ADL Goal: Lower Body Dressing - Progress: Progressing toward goals Pt Will Transfer to Toilet: with supervision;3-in-1;with cueing (comment type and amount) (with a min amt of VCs to followthrough w/ all func tasks) ADL Goal: Toilet Transfer - Progress: Progressing toward goals Pt Will Perform Toileting - Clothing Manipulation: with supervision;Other (comment);with cueing (comment type and amount) (sit<>stand from 3:1) ADL Goal: Toileting - Clothing Manipulation - Progress: Progressing toward goals Pt Will Perform Toileting - Hygiene: with supervision;Sit to stand from 3-in-1/toilet;with cueing (comment type and amount) (with a min amt of VCs to followthrough w/ all func tasks.) ADL Goal: Toileting - Hygiene - Progress: Progressing toward goals  OT Evaluation Precautions/Restrictions  Precautions Precautions: Fall Restrictions Weight Bearing Restrictions: No Prior Functioning Home Living Additional Comments: Unable to gain accurate picture of pt's home environment. Pt poor historian. Appeared unable to comprehend many of OTs questions. Son not present at time of eval Prior Function Comments: Unsure of PLOF, pt had difficulty answering questions ADL ADL Grooming: Performed;Wash/dry hands (minguard A with RW) Where Assessed - Grooming: Standing at sink Upper Body Bathing: Simulated;Set up Where Assessed - Upper Body Bathing: Sitting,  bed;Unsupported Lower Body Bathing: Simulated;Moderate assistance Where Assessed - Lower Body  Bathing: Sit to stand from bed Upper Body Dressing: Simulated;Set up Where Assessed - Upper Body Dressing: Sitting, bed;Unsupported Lower Body Dressing: Performed;Moderate assistance Lower Body Dressing Details (indicate cue type and reason): mod A needed to don/doff socks. Where Assessed - Lower Body Dressing: Sit to stand from bed;Sitting, bed;Unsupported Toilet Transfer: Performed;Minimal assistance Toilet Transfer Method: Ambulating;Other (comment) (with use of RW) Toilet Transfer Equipment: Raised toilet seat with arms (or 3-in-1 over toilet) Toileting - Clothing Manipulation: Simulated;Minimal assistance Where Assessed - Toileting Clothing Manipulation: Sit to stand from 3-in-1 or toilet Toileting - Hygiene: Simulated;Minimal assistance Where Assessed - Toileting Hygiene: Sit to stand from 3-in-1 or toilet Tub/Shower Transfer: Not assessed Tub/Shower Transfer Method: Not assessed Equipment Used: Rolling walker;Other (comment) (3:1) Vision/Perception  Vision - History Baseline Vision: Wears glasses only for reading Patient Visual Report: No change from baseline Vision - Assessment Vision Assessment: Vision not tested Praxis Praxis: Impaired Praxis Impairment Details: Perseveration Praxis-Other Comments: Pt had difficutlty terminating functional tasks such as washing & drying hands. Pt wiped off countertop for several minutes before being cued to stop by therapist. Cognition Cognition Arousal/Alertness: Awake/alert Overall Cognitive Status: No family/caregiver present to determine baseline cognitive functioning Orientation Level: Oriented to person;Disoriented to place;Disoriented to time;Disoriented to situation Cognition - Other Comments: Pt impulsive during mobility, abandoning RW several times. Constant VCs needed for safety. Sensation/Coordination   Extremity Assessment RUE  Assessment RUE Assessment: Within Functional Limits LUE Assessment LUE Assessment: Within Functional Limits Mobility  Bed Mobility Bed Mobility: No Transfers Transfers: Yes Sit to Stand: 4: Min assist;From bed;From chair/3-in-1;With armrests;With upper extremity assist Sit to Stand Details (indicate cue type and reason): VCs for safety, technique, posture and hand placement when using RW Stand to Sit: 4: Min assist;With upper extremity assist;To chair/3-in-1;With armrests Stand to Sit Details: VCs for safety, technique, posture and hand placement when using RW Exercises   End of Session OT - End of Session Equipment Utilized During Treatment: Other (comment) (RW, 3:1) Activity Tolerance: Patient tolerated treatment well Patient left: in chair;with call bell in reach;Other (comment) (with sitter present) Nurse Communication: Mobility status for transfers;Mobility status for ambulation General Behavior During Session: [facility] for tasks performed Cognition: Impaired   Kaoir Loree A 03/29/2011, 10:53 AM  [phone]

## 2011-03-30 LAB — BASIC METABOLIC PANEL
GFR calc Af Amer: 64 mL/min — ABNORMAL LOW (ref >90)
GFR calc non Af Amer: 55 mL/min — ABNORMAL LOW (ref >90)
Glucose, Bld: 138 mg/dL — ABNORMAL HIGH (ref 70–99)
Potassium: 3.9 mEq/L (ref 3.5–5.1)
Sodium: 141 mEq/L (ref 135–145)

## 2011-03-30 LAB — CBC
Hemoglobin: 10.1 g/dL — ABNORMAL LOW (ref 12.0–15.0)
MCHC: 31 g/dL (ref 30.0–36.0)

## 2011-03-30 LAB — TSH: TSH: 0.507 u[IU]/mL (ref 0.350–4.500)

## 2011-03-30 MED ORDER — MEMANTINE HCL 5 MG PO TABS
5.0000 mg | ORAL_TABLET | Freq: Two times a day (BID) | ORAL | Status: DC
  Administered 2011-03-30 – 2011-04-06 (×14): 5 mg via ORAL
  Filled 2011-03-30 (×18): qty 1

## 2011-03-30 MED ORDER — RISPERIDONE 1 MG PO TABS
1.0000 mg | ORAL_TABLET | Freq: Every day | ORAL | Status: DC
  Administered 2011-03-30 – 2011-04-01 (×2): 1 mg via ORAL
  Filled 2011-03-30 (×4): qty 1

## 2011-03-30 NOTE — Progress Notes (Signed)
   SUBJECTIVE:  No acute complaints.  She is not having any SOB or chest pain.  PHYSICAL EXAM Filed Vitals:   03/29/11 1644 03/30/11 0544 03/30/11 0700 03/30/11 1312  BP: 112/65 104/67  101/66  Pulse: 75 97  95  Temp:  98.1 F (36.7 C)  98.2 F (36.8 C)  TempSrc:  Oral  Oral  Resp:  22  20  Height:      Weight:   176 lb 4.8 oz (79.969 kg)   SpO2:  95%  94%   General:  No distress Lungs:  Clear Heart:  RRR Abdomen:  Positive bowel sounds, no rebound no guarding Extremities:  No edema.  LABS: Lab Results  Component Value Date   CKTOTAL 97 03/27/2011   CKMB 3.5 03/27/2011   TROPONINI <0.30 03/27/2011   Results for orders placed during the hospital encounter of 03/26/11 (from the past 24 hour(s))  CBC     Status: Abnormal   Collection Time   03/30/11  4:05 AM      Component Value Range   WBC 4.9  4.0 - 10.5 (K/uL)   RBC 4.14  3.87 - 5.11 (MIL/uL)   Hemoglobin 10.1 (*) 12.0 - 15.0 (g/dL)   HCT 04.5 (*) 40.9 - 46.0 (%)   MCV 78.7  78.0 - 100.0 (fL)   MCH 24.4 (*) 26.0 - 34.0 (pg)   MCHC 31.0  30.0 - 36.0 (g/dL)   RDW 81.1  91.4 - 78.2 (%)   Platelets 256  150 - 400 (K/uL)  BASIC METABOLIC PANEL     Status: Abnormal   Collection Time   03/30/11  4:05 AM      Component Value Range   Sodium 141  135 - 145 (mEq/L)   Potassium 3.9  3.5 - 5.1 (mEq/L)   Chloride 104  96 - 112 (mEq/L)   CO2 30  19 - 32 (mEq/L)   Glucose, Bld 138 (*) 70 - 99 (mg/dL)   BUN 25 (*) 6 - 23 (mg/dL)   Creatinine, Ser 9.56  0.50 - 1.10 (mg/dL)   Calcium 9.1  8.4 - 21.3 (mg/dL)   GFR calc non Af Amer 55 (*) >90 (mL/min)   GFR calc Af Amer 64 (*) >90 (mL/min)  PRO B NATRIURETIC PEPTIDE     Status: Abnormal   Collection Time   03/30/11  4:05 AM      Component Value Range   Pro B Natriuretic peptide (BNP) 3002.0 (*) 0 - 450 (pg/mL)  TSH     Status: Normal   Collection Time   03/30/11  4:05 AM      Component Value Range   TSH 0.507  0.350 - 4.500 (uIU/mL)    Intake/Output Summary (Last 24  hours) at 03/30/11 1420 Last data filed at 03/30/11 1300  Gross per 24 hour  Intake    480 ml  Output    803 ml  Net   -323 ml    ASSESSMENT AND PLAN:   1) CAD:  Enzymes negative.  No evidence of acute coronary syndrome.  Continue medical management    2) CHF:  Good urine output.  EF 25 - 30% with severe regional wall motion abnormalities. Medical management.  Unable to titrate meds today with her BP.  Continue current therapy.  I spoke with her son at length.  Jenny Brady 03/30/2011 2:20 PM

## 2011-03-30 NOTE — Progress Notes (Signed)
03/30/11 0535 Not sure if pt voided since foley was pulled out at 1930 on 03/29/11. Bladder scanned pt- maximum amount of urine shown was 120cc. Pt states she doesn't have to urinate. Pt also will not drink fluids and does not have IV fluids running either. Will continue to monitor pt. Mechele Collin

## 2011-03-30 NOTE — Progress Notes (Addendum)
PATIENT DETAILS Name: Jenny Brady Age: 77 y.o. Sex: female Date of Birth: [birth year] Admit Date: 03/26/2011 [id number], [name], MD, MD  CONSULTS: 1.  Dr. Rollene Rotunda, Cardiology  Interval History: 77 y/o CF admitted with vague symptoms, but found to have EKG changes with possible NSTEMI vs LVH and repolarization changes on EKG. IV heparin started in ED but d/c'd after cardiac markers were cycled and found to be negative. V/Q scan low probability for pulmonary embolism. Responded well to IV lasix. Echo was done and showed severe wall motion anomaly, not likely candidate for operative or invasive therapy given her comorbidities. She appears to have advanced dementia and has remained agitated, and does have hoarding behavior and some confusion per her son, [name]. The plans at this point are for medical management. A PT/OT evaluation was done to determine her disposition, and at this point, 24-hour supervision or skilled nursing home placement is recommended.  She does have a 24-hour sitter and I am told that she will need to have this discontinued for acceptance to a facility.  ROS: Ms. Heiner denies chest pain and dyspnea. She has reported pain in the vaginal area with a "knot".    Objective: Vital signs in last 24 hours: Temp:  [97.7 F (36.5 C)-98.1 F (36.7 C)] 98.1 F (36.7 C) (12/20 0544) Pulse Rate:  [75-97] 97  (12/20 0544) Resp:  [20-22] 22  (12/20 0544) BP: (92-112)/(45-67) 104/67 mmHg (12/20 0544) SpO2:  [95 %] 95 % (12/20 0544) Weight:  [79.969 kg (176 lb 4.8 oz)] 176 lb 4.8 oz (79.969 kg) (12/20 0700) Weight change: -0.631 kg (-1 lb 6.3 oz) Last BM Date: 03/25/11  Intake/Output from previous day:  Intake/Output Summary (Last 24 hours) at 03/30/11 1108 Last data filed at 03/30/11 0830  Gross per 24 hour  Intake    600 ml  Output   1452 ml  Net   -852 ml     Physical Exam:  Gen:  No acute distress. Cardiovascular:  Heart sounds regular with no  murmurs, rubs, or gallops. Respiratory: Lungs clear to auscultation bilaterally. Gastrointestinal: Abdomen soft, nontender, nondistended with normal active bowel sounds. Extremities: No clubbing, edema, or cyanosis. GU: External genitalia appear normal with no lesions.   Lab Results: Basic Metabolic Panel:  Lab 03/30/11 6962 03/29/11 0456 03/28/11 0435 03/27/11 0537 03/26/11 0840  NA 141 140 139 140 139  K 3.9 3.3* -- -- --  CL 104 102 104 103 104  CO2 30 28 30 28 26   GLUCOSE 138* 132* 127* 128* 164*  BUN 25* 15 15 15 13   CREATININE 0.93 0.79 0.76 0.75 0.67  CALCIUM 9.1 8.8 8.6 8.9 8.8  MG -- -- -- -- 1.8  PHOS -- -- -- -- --   GFR Estimated Creatinine Clearance: 48.9 ml/min (by C-G formula based on Cr of 0.93). Liver Function Tests:  Lab 03/26/11 0840  AST 30  ALT 27  ALKPHOS 116  BILITOT 0.3  PROT 6.3  ALBUMIN 3.1*   Coagulation profile  Lab 03/27/11 0530 03/26/11 0840  INR 1.16 0.98  PROTIME -- --    CBC:  Lab 03/30/11 0405 03/29/11 0456 03/28/11 0435 03/27/11 0530 03/26/11 0840  WBC 4.9 5.5 4.2 5.6 5.4  NEUTROABS -- -- -- -- 3.7  HGB 10.1* 10.2* 9.5* 9.6* 10.7*  HCT 32.6* 32.6* 30.2* 30.6* 34.2*  MCV 78.7 78.2 79.1 79.7 81.2  PLT 256 249 232 249 277   Cardiac Enzymes:  Lab 03/27/11 0530 03/27/11 03/26/11 1818  03/26/11 0925  CKTOTAL 97 100 112 111  CKMB 3.5 3.6 3.9 3.6  CKMBINDEX -- -- -- --  TROPONINI <0.30 <0.30 <0.30 <0.30     Ref. Range 03/26/2011 09:29  Pro B Natriuretic peptide (BNP) Latest Range: 0-450 pg/mL 3751.0 (H)    Ref. Range 03/30/2011 04:05  Pro B Natriuretic peptide (BNP) Latest Range: 0-450 pg/mL 3002.0 (H)    Ref. Range 03/30/2011 04:05  TSH Latest Range: 0.350-4.500 uIU/mL 0.507   Microbiology Recent Results (from the past 240 hour(s))  MRSA PCR SCREENING     Status: Normal   Collection Time   03/26/11  5:54 PM      Component Value Range Status Comment   MRSA by PCR NEGATIVE  NEGATIVE  Final     Recent Results (from  the past 240 hour(s))  MRSA PCR SCREENING     Status: Normal   Collection Time   03/26/11  5:54 PM      Component Value Range Status Comment   MRSA by PCR NEGATIVE  NEGATIVE  Final     Studies/Results: 1.  CXR 03/26/11: IMPRESSION: Cardiomegaly and pulmonary edema. Confluent edema versus infectious infiltrate in the left upper  Lobe. 2.  VQ Scan 03/25/22: IMPRESSION: Low probability for acute pulmonary embolus. 3.  2-D Echocardiogram 03/26/22 Study Conclusions  - Left ventricle: The cavity size was mildly dilated. Wall thickness was normal. Systolic function was severely reduced. The estimated ejection fraction was in the range of 25% to 30%. The anteroseptal and inferoseptal walls appear akinetic. The anterior and inferior walls appear severely hypokinetic. The lateral wall is relatively preserved. Doppler parameters are consistent with restrictive physiology, indicative of decreased left ventricular diastolic compliance and/or increased left atrial pressure. E/medial e' > 15 suggests LV end diastolic pressure at least 20 mmHg. - Aortic valve: There was no stenosis. - Mitral valve: Moderately calcified annulus. Moderate regurgitation. - Left atrium: The atrium was moderately dilated. - Right ventricle: The cavity size was normal. Systolic function was normal. - Right atrium: The atrium was mildly dilated. - Tricuspid valve: Moderate regurgitation. Peak RV-RA gradient:49mm Hg (S). - Pulmonary arteries: PA systolic pressure 65-69 mmHg. - Systemic veins: IVC measured 2.3 cm with normal respirophasic variation, suggesting RA pressure 6-10 mmHg. - Pericardium, extracardiac: There was a pleural effusion. Impressions:  - Mildly dilated LV with severely reduced systolic function, EF 25-30%. Wall motion abnormalities as listed above suggesting ischemic cardiomyopathy. Severe diastolic dysfunction with evidence for elevated LV filling pressure. Moderate to severe pulmonary  hypertension. Normal RV size and systolic function. Moderate MR, moderate TR.     Medications: Scheduled Meds:    . aspirin EC  81 mg Oral Daily  . carvedilol  6.25 mg Oral BID WC  . cholecalciferol  1,000 Units Oral Daily  . enalapril  2.5 mg Oral Daily  . famotidine  20 mg Oral Daily  . furosemide  40 mg Oral Daily  . levothyroxine  56 mcg Oral Daily  .  morphine injection  4 mg Intravenous Once  . potassium chloride  20 mEq Oral BID   Continuous Infusions:  PRN Meds:.acetaminophen, ALPRAZolam, LORazepam, meclizine, nitroGLYCERIN, ondansetron (ZOFRAN) IV Antibiotics: Anti-infectives    None       Assessment/Plan:  Principal Problem:  *Systolic CHF, acute on chronic The patient was admitted and a diagnostic evaluation undertaken. An initial chest radiograph showed pulmonary edema and her pro BNP levels were elevated. Cardiology was subsequently consult and and a two-dimensional echocardiogram was obtained which showed  severe depression of LV function. She was put on diuretic therapy initially with subsequent improvement in her symptoms. An ACE inhibitor was added on 03/28/2011. Her Lasix dose was reduced 03/29/11. Cardiology indicates that there are no further plans to perform invasive studies given her comorbidities and that she should remain medically optimized. Her pro BNP is slowly coming down.  Active Problems:  Syncope The patient's initial presenting complaint was one of syncope. Orthostatics were ordered but these cannot be found in the chart. Will check them again today. A VQ scan was done which showed low probability for pulmonary embolism. A PT/OT evaluation indicates that the patient should have 24-hour supervision in the home or be placed in a skilled nursing home facility. Both the patient and the patient's son agreed to this plan of care.  DEMENTIA-mod-severe The patient has required Haldol for agitation and has exhibited paranoia and sundowning type behavior.  She is currently calm. I spoke with patient's son about initiating therapy for symptom control with Risperdal and starting her on Namenda to help decrease the rate of progression of her dementia and he is agreeable to a trial of these medications.  CP-Pe vs ACS No evidence of a pulmonary embolism or acute coronary syndrome based on VQ scan and cardiac markers. Patient's chest pain was vague and was likely due to other causes including decompensated heart failure.  CAD (?stent) cath 90's [name] Patient had negative cardiac markers. No further invasive testing as recommended by cardiology. Continue aspirin and beta blocker therapy. Patient's lipids are well-controlled.  Hyperthyroidism-s/p RAI admin The patient remains on Synthroid for thyroid hormone replacement therapy. Patient's TSH was checked and found to be normal. GERD (gastroesophageal reflux disease) The patient has been maintained on Pepcid for symptom control while in the hospital.  Normocytic anemia Likely secondary to chronic kidney disease.  Stage III chronic kidney disease The patient's renal function is currently stable.  Hypokalemia Secondary to diuretic therapy. Patient's potassium was replaced.  LOS: 4 days   Hillery Aldo, MD Pager [phone]  03/30/2011, 11:08 AM

## 2011-03-30 NOTE — Progress Notes (Addendum)
CSW visited with pt who has a Comptroller in the room. CSW has attempted to reach family to discuss discharge planning and the phone line was busy, after several attempts. CSW will continue calling. Please note that if the family decides on SNF pts sitter will have to be discontinued for 24 hours before they can go to a facility. CSW notified by the pts nurse that pt is only oriented to person. CSW will continue to follow.  Patrice Paradise, LCSWA 03/30/2011 9:07 AM [phone]

## 2011-03-31 ENCOUNTER — Telehealth (HOSPITAL_COMMUNITY): Payer: Self-pay | Admitting: *Deleted

## 2011-03-31 LAB — CBC
Platelets: 218 10*3/uL (ref 150–400)
RDW: 14.4 % (ref 11.5–15.5)
WBC: 5.6 10*3/uL (ref 4.0–10.5)

## 2011-03-31 NOTE — Progress Notes (Signed)
CSW visited with pt and pts son. CSW discussed bed offers with pts son and he expressed that he is not pleased with the offers that were given. Pts son requested CSW call two of the facilities to see if they could extend bed offers which the CSW has done and is awaiting a call back from both facilities. Pts son states that he feels like the staff is pushing the pt out of the hospital and she should be allowed to stay in the hospital because she will not  receive the same level of care at a SNF that she is currently getting at the hospital. CSW explained that once the pt is medically cleared for discharge insurance will no longer cover her hospital stay and he asked if we could word documentation so that pt could remain in the hospital. CSW explained that hospital staff would not falsify any documentation. CSW has spoken with Dr. Darnelle Catalan to inform her of the pts son hesitation to choose a bed.  Please note pts sitter will have to be discontinued for 24 hours for her to go to a SNF. CSW will continue to follow the pt and offer support. Patrice Paradise, LCSWA 03/31/2011 2:08 PM [phone]

## 2011-03-31 NOTE — Progress Notes (Signed)
   SUBJECTIVE:  No acute complaints. She denies any chest pain or SOB this am. PHYSICAL EXAM Filed Vitals:   03/30/11 0700 03/30/11 1312 03/30/11 2150 03/31/11 0659  BP:  101/66 102/68 104/69  Pulse:  95 97 92  Temp:  98.2 F (36.8 C) 97.8 F (36.6 C) 98 F (36.7 C)  TempSrc:  Oral Oral Oral  Resp:  20 20 20   Height:      Weight: 176 lb 4.8 oz (79.969 kg)     SpO2:  94% 97% 96%   General:  No distress Lungs:  Clear Heart:  RRR Abdomen:  Positive bowel sounds, no rebound no guarding Extremities:  No edema.  LABS: Lab Results  Component Value Date   CKTOTAL 97 03/27/2011   CKMB 3.5 03/27/2011   TROPONINI <0.30 03/27/2011   Results for orders placed during the hospital encounter of 03/26/11 (from the past 24 hour(s))  CBC     Status: Abnormal   Collection Time   03/31/11  4:32 AM      Component Value Range   WBC 5.6  4.0 - 10.5 (K/uL)   RBC 4.08  3.87 - 5.11 (MIL/uL)   Hemoglobin 10.0 (*) 12.0 - 15.0 (g/dL)   HCT 16.1 (*) 09.6 - 46.0 (%)   MCV 78.4  78.0 - 100.0 (fL)   MCH 24.5 (*) 26.0 - 34.0 (pg)   MCHC 31.3  30.0 - 36.0 (g/dL)   RDW 04.5  40.9 - 81.1 (%)   Platelets 218  150 - 400 (K/uL)    Intake/Output Summary (Last 24 hours) at 03/31/11 0716 Last data filed at 03/30/11 1932  Gross per 24 hour  Intake    700 ml  Output    502 ml  Net    198 ml    ASSESSMENT AND PLAN:   1) CAD:  Ruled out this admission.  No evidence of acute coronary syndrome.  Continue medical management.  Despite the change in her EF from 09 until now she is not having any acute symptoms and with her confusion and fear related to her previous cath I would not suggest an invasive evaluation.     2) CHF:  EF 25 - 30% with severe regional wall motion abnormalities. Medical management.  Unable to titrate meds today with her BP.  Continue current therapy.  I spoke with her son at length yesterday.  Please call us with further questions.  Call our office at discharge to arrange a follow up appt  in two to four weeks with Tereso Newcomer PA.  [name] [facility] 03/31/2011 7:16 AM

## 2011-03-31 NOTE — Progress Notes (Addendum)
PATIENT DETAILS Name: Jenny Brady Age: 77 y.o. Sex: female Date of Birth: February 28, 1926 Admit Date: 03/26/2011 ZOX:[id number], Jenny Ammons, MD, MD POA: Jenny Brady, cell [phone]  CONSULTS: 1.  Dr. Rollene Rotunda, Cardiology  Interval History: 77 y/o CF admitted with vague symptoms, but found to have EKG changes with possible NSTEMI vs LVH and repolarization changes on EKG. IV heparin started in ED but d/c'd after cardiac markers were cycled and found to be negative. V/Q scan low probability for pulmonary embolism. Responded well to IV lasix. Echo was done and showed severe wall motion anomaly, not likely candidate for operative or invasive therapy given her comorbidities. She appears to have advanced dementia and has remained agitated, and does have hoarding behavior and some confusion per her son, Jenny Brady. The plans at this point are for medical management. A PT/OT evaluation was done to determine her disposition, and at this point, 24-hour supervision or skilled nursing home placement is recommended.  She does have a 24-hour sitter and I am told that she will need to have this discontinued for acceptance to a facility.  She had an episode of severe agitation today.    ROS: Jenny Brady denies chest pain and dyspnea. She is visibly upset and speaking in [place], but appears to be upset with her son, who abruptly left when visiting with her earlier today.    Objective: Vital signs in last 24 hours: Temp:  [97.5 F (36.4 C)-98 F (36.7 C)] 97.5 F (36.4 C) (12/21 1327) Pulse Rate:  [87-97] 92  (12/21 1327) Resp:  [20] 20  (12/21 1327) BP: (93-106)/(63-71) 95/68 mmHg (12/21 1327) SpO2:  [95 %-97 %] 95 % (12/21 1327) Weight change:  Last BM Date: 03/30/11  Intake/Output from previous day:  Intake/Output Summary (Last 24 hours) at 03/31/11 1534 Last data filed at 03/31/11 1300  Gross per 24 hour  Intake    940 ml  Output    150 ml  Net    790 ml     Physical Exam:  Gen:   No acute distress. Cardiovascular:  Heart sounds regular with no murmurs, rubs, or gallops. Respiratory: Lungs clear to auscultation bilaterally. Gastrointestinal: Abdomen soft, nontender, nondistended with normal active bowel sounds. Extremities: No clubbing, edema, or cyanosis. GU: External genitalia appear normal with no lesions.   Lab Results: Basic Metabolic Panel:  Lab 03/30/11 2956 03/29/11 0456 03/28/11 0435 03/27/11 0537 03/26/11 0840  NA 141 140 139 140 139  K 3.9 3.3* -- -- --  CL 104 102 104 103 104  CO2 30 28 30 28 26   GLUCOSE 138* 132* 127* 128* 164*  BUN 25* 15 15 15 13   CREATININE 0.93 0.79 0.76 0.75 0.67  CALCIUM 9.1 8.8 8.6 8.9 8.8  MG -- -- -- -- 1.8  PHOS -- -- -- -- --   GFR Estimated Creatinine Clearance: 48.9 ml/min (by C-G formula based on Cr of 0.93). Liver Function Tests:  Lab 03/26/11 0840  AST 30  ALT 27  ALKPHOS 116  BILITOT 0.3  PROT 6.3  ALBUMIN 3.1*   Coagulation profile  Lab 03/27/11 0530 03/26/11 0840  INR 1.16 0.98  PROTIME -- --    CBC:  Lab 03/31/11 0432 03/30/11 0405 03/29/11 0456 03/28/11 0435 03/27/11 0530 03/26/11 0840  WBC 5.6 4.9 5.5 4.2 5.6 --  NEUTROABS -- -- -- -- -- 3.7  HGB 10.0* 10.1* 10.2* 9.5* 9.6* --  HCT 32.0* 32.6* 32.6* 30.2* 30.6* --  MCV 78.4 78.7 78.2 79.1  79.7 --  PLT 218 256 249 232 249 --   Cardiac Enzymes:  Lab 03/27/11 0530 03/27/11 03/26/11 1818 03/26/11 0925  CKTOTAL 97 100 112 111  CKMB 3.5 3.6 3.9 3.6  CKMBINDEX -- -- -- --  TROPONINI <0.30 <0.30 <0.30 <0.30     Ref. Range 03/26/2011 09:29  Pro B Natriuretic peptide (BNP) Latest Range: 0-450 pg/mL 3751.0 (H)    Ref. Range 03/30/2011 04:05  Pro B Natriuretic peptide (BNP) Latest Range: 0-450 pg/mL 3002.0 (H)    Ref. Range 03/30/2011 04:05  TSH Latest Range: 0.350-4.500 uIU/mL 0.507   Microbiology Recent Results (from the past 240 hour(s))  MRSA PCR SCREENING     Status: Normal   Collection Time   03/26/11  5:54 PM       Component Value Range Status Comment   MRSA by PCR NEGATIVE  NEGATIVE  Final     Recent Results (from the past 240 hour(s))  MRSA PCR SCREENING     Status: Normal   Collection Time   03/26/11  5:54 PM      Component Value Range Status Comment   MRSA by PCR NEGATIVE  NEGATIVE  Final     Studies/Results: 1.  CXR 03/26/11: IMPRESSION: Cardiomegaly and pulmonary edema. Confluent edema versus infectious infiltrate in the left upper  Lobe. 2.  VQ Scan 03/25/22: IMPRESSION: Low probability for acute pulmonary embolus. 3.  2-D Echocardiogram 03/26/22 Study Conclusions  - Left ventricle: The cavity size was mildly dilated. Wall thickness was normal. Systolic function was severely reduced. The estimated ejection fraction was in the range of 25% to 30%. The anteroseptal and inferoseptal walls appear akinetic. The anterior and inferior walls appear severely hypokinetic. The lateral wall is relatively preserved. Doppler parameters are consistent with restrictive physiology, indicative of decreased left ventricular diastolic compliance and/or increased left atrial pressure. E/medial e' > 15 suggests LV end diastolic pressure at least 20 mmHg. - Aortic valve: There was no stenosis. - Mitral valve: Moderately calcified annulus. Moderate regurgitation. - Left atrium: The atrium was moderately dilated. - Right ventricle: The cavity size was normal. Systolic function was normal. - Right atrium: The atrium was mildly dilated. - Tricuspid valve: Moderate regurgitation. Peak RV-RA gradient:62mm Hg (S). - Pulmonary arteries: PA systolic pressure 65-69 mmHg. - Systemic veins: IVC measured 2.3 cm with normal respirophasic variation, suggesting RA pressure 6-10 mmHg. - Pericardium, extracardiac: There was a pleural effusion. Impressions:  - Mildly dilated LV with severely reduced systolic function, EF 25-30%. Wall motion abnormalities as listed above suggesting ischemic cardiomyopathy. Severe  diastolic dysfunction with evidence for elevated LV filling pressure. Moderate to severe pulmonary hypertension. Normal RV size and systolic function. Moderate MR, moderate TR.     Medications: Scheduled Meds:    . aspirin EC  81 mg Oral Daily  . carvedilol  6.25 mg Oral BID WC  . cholecalciferol  1,000 Units Oral Daily  . enalapril  2.5 mg Oral Daily  . famotidine  20 mg Oral Daily  . furosemide  40 mg Oral Daily  . levothyroxine  56 mcg Oral Daily  . memantine  5 mg Oral BID  .  morphine injection  4 mg Intravenous Once  . potassium chloride  20 mEq Oral BID  . risperiDONE  1 mg Oral QHS   Continuous Infusions:  PRN Meds:.acetaminophen, ALPRAZolam, LORazepam, meclizine, nitroGLYCERIN, ondansetron (ZOFRAN) IV Antibiotics: Anti-infectives    None       Assessment/Plan:  Principal Problem:  *Systolic CHF, acute on  chronic The patient was admitted and a diagnostic evaluation undertaken. An initial chest radiograph showed pulmonary edema and her pro BNP levels were elevated. Cardiology was subsequently consult and and a two-dimensional echocardiogram was obtained which showed severe depression of LV function. She was put on diuretic therapy initially with subsequent improvement in her symptoms. An ACE inhibitor was added on 03/28/2011. Her Lasix dose was reduced 03/29/11. Cardiology indicates that there are no further plans to perform invasive studies given her comorbidities and that she should remain medically optimized. Her pro BNP is slowly coming down.  Active Problems:  Syncope The patient's initial presenting complaint was one of syncope. Orthostatics were ordered but these cannot be found in the chart. Will check them again today. A VQ scan was done which showed low probability for pulmonary embolism. A PT/OT evaluation indicates that the patient should have 24-hour supervision in the home or be placed in a skilled nursing home facility. Both the patient and the patient's  son agreed to this plan of care.  DEMENTIA-mod-severe The patient has required Haldol for agitation and has exhibited paranoia and sundowning type behavior. She is currently agitated. I spoke with patient's son about initiating therapy for symptom control with Risperdal and starting her on Namenda to help decrease the rate of progression of her dementia and we started these medications yesterday.  I spoke with her son about obtaining a psychiatry consultation, and he is agreeable.   CP-Pe vs ACS No evidence of a pulmonary embolism or acute coronary syndrome based on VQ scan and cardiac markers. Patient's chest pain was vague and was likely due to other causes including decompensated heart failure.  CAD (?stent) cath 90's [name] Patient had negative cardiac markers. No further invasive testing as recommended by cardiology. Continue aspirin and beta blocker therapy. Patient's lipids are well-controlled.  Hyperthyroidism-s/p RAI admin The patient remains on Synthroid for thyroid hormone replacement therapy. Patient's TSH was checked and found to be normal. GERD (gastroesophageal reflux disease) The patient has been maintained on Pepcid for symptom control while in the hospital.  Normocytic anemia Likely secondary to chronic kidney disease.  Stage III chronic kidney disease The patient's renal function is currently stable.  Hypokalemia Secondary to diuretic therapy. Patient's potassium was replaced.  LOS: 5 days   Hillery Aldo, MD Pager [phone]  03/31/2011, 3:34 PM

## 2011-03-31 NOTE — Progress Notes (Signed)
Physical Therapy Treatment Patient Details Name: Jenny Brady MRN: [id number] DOB: Jun 19, 1925 Today's Date: 03/31/2011 Time: 4098-1191 Charge: Leonia Reeves PT Assessment/Plan  PT - Assessment/Plan Comments on Treatment Session: Pt ambulated in halls with close supervision.  Pt did not understand why PT needed to be ambulating with her even after son tried to explain for safety reasons.  Pt more cooperative when son gave directions instead of PT.  Recommend 24/7 supervision upon d/c with HHPT (if 24/7 supervision unavailable then SNF)  Pt a little unsteady with ambulation occasionally without LOB but refused RW. PT Plan: Discharge plan remains appropriate PT Frequency: Min 3X/week Follow Up Recommendations: Home health PT;24 hour supervision/assistance (SNF if 24/7 unavailable) Equipment Recommended: Other (comment) (Recommend RW however pt seems unlikely to agree to use it) PT Goals  Acute Rehab PT Goals Pt will go Sit to Stand: with modified independence PT Goal: Sit to Stand - Progress: Updated due to goal met Pt will Ambulate: >150 feet;with modified independence;with least restrictive assistive device PT Goal: Ambulate - Progress: Revised (modified due to lack of progress/goal met)  PT Treatment Precautions/Restrictions  Precautions Precautions: Fall Restrictions Weight Bearing Restrictions: No Mobility (including Balance) Transfers Transfers: Yes Sit to Stand: 5: Supervision Sit to Stand Details (indicate cue type and reason): close supervision Stand to Sit: 5: Supervision Stand to Sit Details: close supervision Ambulation/Gait Ambulation/Gait: Yes Ambulation/Gait Assistance: 5: Supervision Ambulation/Gait Assistance Details (indicate cue type and reason): close supervision, pt refused RW and did not want PT ambulating with her, son assisted in coaxing pt to perform mobility, pt would occasionally reach for rail and unsteady occasionally as well but no LOB Ambulation Distance  (Feet): 400 Feet Assistive device: None Gait Pattern: Step-through pattern Gait velocity: slow cadence    Exercise    End of Session PT - End of Session Activity Tolerance: Patient tolerated treatment well Patient left: in chair;with family/visitor present (with sitter) General Behavior During Session: [facility] for tasks performed Cognition: Impaired, at baseline  Shamon Cothran,KATHrine E 03/31/2011, 3:29 PM

## 2011-04-01 DIAGNOSIS — F0391 Unspecified dementia with behavioral disturbance: Secondary | ICD-10-CM

## 2011-04-01 LAB — CBC
Hemoglobin: 9.8 g/dL — ABNORMAL LOW (ref 12.0–15.0)
RBC: 3.96 MIL/uL (ref 3.87–5.11)
WBC: 4.9 10*3/uL (ref 4.0–10.5)

## 2011-04-01 NOTE — Progress Notes (Addendum)
PATIENT DETAILS Name: Jenny Brady Age: 77 y.o. Sex: female Date of Birth: Dec 11, 1925 Admit Date: 03/26/2011 [id number], [name], MD, MD POA: [name], cell [phone]  CONSULTS: 1.  Dr. Rollene Rotunda, Cardiology  Interval History: 77 y/o CF admitted with vague symptoms, but found to have EKG changes with possible NSTEMI vs LVH and repolarization changes on EKG. IV heparin started in ED but d/c'd after cardiac markers were cycled and found to be negative. V/Q scan low probability for pulmonary embolism. Responded well to IV lasix. Echo was done and showed severe wall motion anomaly, not likely candidate for operative or invasive therapy given her comorbidities. She appears to have advanced dementia and has remained agitated, and does have hoarding behavior and some confusion per her son, Jenny Brady. The plans at this point are for medical management. A PT/OT evaluation was done to determine her disposition, and at this point, 24-hour supervision or skilled nursing home placement is recommended.  She does have a 24-hour sitter and I am told that she will need to have this discontinued for acceptance to a facility.  She had an episode of severe agitation 03/31/11 prompting [place] to ask for psychiatry evaluation, which is pending at this time.   ROS: [name] denies chest pain and dyspnea. .    Objective: Vital signs in last 24 hours: Temp:  [97.5 F (36.4 C)-97.9 F (36.6 C)] 97.9 F (36.6 C) (12/22 0900) Pulse Rate:  [76-96] 76  (12/22 0900) Resp:  [18-20] 20  (12/22 0900) BP: (95-132)/(68-82) 103/68 mmHg (12/22 0900) SpO2:  [94 %-97 %] 94 % (12/22 0900) Weight change:  Last BM Date: 03/30/11  Intake/Output from previous day:  Intake/Output Summary (Last 24 hours) at 04/01/11 0934 Last data filed at 03/31/11 1300  Gross per 24 hour  Intake    120 ml  Output      0 ml  Net    120 ml     Physical Exam:  Gen:  No acute distress. Cardiovascular:  Heart sounds  regular with no murmurs, rubs, or gallops. Respiratory: Lungs clear to auscultation bilaterally. Gastrointestinal: Abdomen soft, nontender, nondistended with normal active bowel sounds. Extremities: No clubbing, edema, or cyanosis. GU: External genitalia appear normal with no lesions.   Lab Results: Basic Metabolic Panel:  Lab 03/30/11 9528 03/29/11 0456 03/28/11 0435 03/27/11 0537 03/26/11 0840  NA 141 140 139 140 139  K 3.9 3.3* -- -- --  CL 104 102 104 103 104  CO2 30 28 30 28 26   GLUCOSE 138* 132* 127* 128* 164*  BUN 25* 15 15 15 13   CREATININE 0.93 0.79 0.76 0.75 0.67  CALCIUM 9.1 8.8 8.6 8.9 8.8  MG -- -- -- -- 1.8  PHOS -- -- -- -- --   GFR Estimated Creatinine Clearance: 48.9 ml/min (by C-G formula based on Cr of 0.93). Liver Function Tests:  Lab 03/26/11 0840  AST 30  ALT 27  ALKPHOS 116  BILITOT 0.3  PROT 6.3  ALBUMIN 3.1*   Coagulation profile  Lab 03/27/11 0530 03/26/11 0840  INR 1.16 0.98  PROTIME -- --    CBC:  Lab 04/01/11 0520 03/31/11 0432 03/30/11 0405 03/29/11 0456 03/28/11 0435 03/26/11 0840  WBC 4.9 5.6 4.9 5.5 4.2 --  NEUTROABS -- -- -- -- -- 3.7  HGB 9.8* 10.0* 10.1* 10.2* 9.5* --  HCT 31.1* 32.0* 32.6* 32.6* 30.2* --  MCV 78.5 78.4 78.7 78.2 79.1 --  PLT 221 218 256 249 232 --  Cardiac Enzymes:  Lab 03/27/11 0530 03/27/11 03/26/11 1818 03/26/11 0925  CKTOTAL 97 100 112 111  CKMB 3.5 3.6 3.9 3.6  CKMBINDEX -- -- -- --  TROPONINI <0.30 <0.30 <0.30 <0.30     Ref. Range 03/26/2011 09:29  Pro B Natriuretic peptide (BNP) Latest Range: 0-450 pg/mL 3751.0 (H)    Ref. Range 03/30/2011 04:05  Pro B Natriuretic peptide (BNP) Latest Range: 0-450 pg/mL 3002.0 (H)    Ref. Range 03/30/2011 04:05  TSH Latest Range: 0.350-4.500 uIU/mL 0.507   Microbiology Recent Results (from the past 240 hour(s))  MRSA PCR SCREENING     Status: Normal   Collection Time   03/26/11  5:54 PM      Component Value Range Status Comment   MRSA by PCR  NEGATIVE  NEGATIVE  Final     Recent Results (from the past 240 hour(s))  MRSA PCR SCREENING     Status: Normal   Collection Time   03/26/11  5:54 PM      Component Value Range Status Comment   MRSA by PCR NEGATIVE  NEGATIVE  Final     Studies/Results: 1.  CXR 03/26/11: IMPRESSION: Cardiomegaly and pulmonary edema. Confluent edema versus infectious infiltrate in the left upper  Lobe. 2.  VQ Scan 03/25/22: IMPRESSION: Low probability for acute pulmonary embolus. 3.  2-D Echocardiogram 03/26/22 Study Conclusions  - Left ventricle: The cavity size was mildly dilated. Wall thickness was normal. Systolic function was severely reduced. The estimated ejection fraction was in the range of 25% to 30%. The anteroseptal and inferoseptal walls appear akinetic. The anterior and inferior walls appear severely hypokinetic. The lateral wall is relatively preserved. Doppler parameters are consistent with restrictive physiology, indicative of decreased left ventricular diastolic compliance and/or increased left atrial pressure. E/medial e' > 15 suggests LV end diastolic pressure at least 20 mmHg. - Aortic valve: There was no stenosis. - Mitral valve: Moderately calcified annulus. Moderate regurgitation. - Left atrium: The atrium was moderately dilated. - Right ventricle: The cavity size was normal. Systolic function was normal. - Right atrium: The atrium was mildly dilated. - Tricuspid valve: Moderate regurgitation. Peak RV-RA gradient:70mm Hg (S). - Pulmonary arteries: PA systolic pressure 65-69 mmHg. - Systemic veins: IVC measured 2.3 cm with normal respirophasic variation, suggesting RA pressure 6-10 mmHg. - Pericardium, extracardiac: There was a pleural effusion. Impressions:  - Mildly dilated LV with severely reduced systolic function, EF 25-30%. Wall motion abnormalities as listed above suggesting ischemic cardiomyopathy. Severe diastolic dysfunction with evidence for elevated LV  filling pressure. Moderate to severe pulmonary hypertension. Normal RV size and systolic function. Moderate MR, moderate TR.     Medications: Scheduled Meds:    . aspirin EC  81 mg Oral Daily  . carvedilol  6.25 mg Oral BID WC  . cholecalciferol  1,000 Units Oral Daily  . enalapril  2.5 mg Oral Daily  . famotidine  20 mg Oral Daily  . furosemide  40 mg Oral Daily  . levothyroxine  56 mcg Oral Daily  . memantine  5 mg Oral BID  .  morphine injection  4 mg Intravenous Once  . potassium chloride  20 mEq Oral BID  . risperiDONE  1 mg Oral QHS   Continuous Infusions:  PRN Meds:.acetaminophen, ALPRAZolam, LORazepam, meclizine, nitroGLYCERIN, ondansetron (ZOFRAN) IV Antibiotics: Anti-infectives    None       Assessment/Plan:  Principal Problem:  *Systolic CHF, acute on chronic The patient was admitted and a diagnostic evaluation undertaken. An initial  chest radiograph showed pulmonary edema and her pro BNP levels were elevated. Cardiology was subsequently consult and and a two-dimensional echocardiogram was obtained which showed severe depression of LV function. She was put on diuretic therapy initially with subsequent improvement in her symptoms. An ACE inhibitor was added on 03/28/2011. Her Lasix dose was reduced 03/29/11. Cardiology indicates that there are no further plans to perform invasive studies given her comorbidities and that she should remain medically optimized. Her pro BNP is slowly coming down.  Active Problems:  Syncope The patient's initial presenting complaint was one of syncope. Orthostatics were ordered but these cannot be found in the chart. Will check them again today. A VQ scan was done which showed low probability for pulmonary embolism. A PT/OT evaluation indicates that the patient should have 24-hour supervision in the home or be placed in a skilled nursing home facility. Both the patient and the patient's son agreed to this plan of care.   DEMENTIA-mod-severe The patient has required Haldol for agitation and has exhibited paranoia and sundowning type behavior. She is currently agitated. I spoke with patient's son about initiating therapy for symptom control with Risperdal and starting her on Namenda to help decrease the rate of progression of her dementia and we started these medications 03/30/11.  I spoke with her son about obtaining a psychiatry consultation 03/31/11, and he is agreeable. The consultation is pending at the time of this dictation.  CP-Pe vs ACS No evidence of a pulmonary embolism or acute coronary syndrome based on VQ scan and cardiac markers. Patient's chest pain was vague and was likely due to other causes including decompensated heart failure.  CAD (?stent) cath 90's [name] Patient had negative cardiac markers. No further invasive testing as recommended by cardiology. Continue aspirin and beta blocker therapy. Patient's lipids are well-controlled.  Hyperthyroidism-s/p RAI admin The patient remains on Synthroid for thyroid hormone replacement therapy. Patient's TSH was checked and found to be normal. GERD (gastroesophageal reflux disease) The patient has been maintained on Pepcid for symptom control while in the hospital.  Normocytic anemia Likely secondary to chronic kidney disease.  Stage III chronic kidney disease The patient's renal function is currently stable.  Hypokalemia Secondary to diuretic therapy. Patient's potassium was replaced.  LOS: 6 days   Hillery Aldo, MD Pager [phone]  04/01/2011, 9:34 AM
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
## 2011-04-02 LAB — CBC
HCT: 32.9 % — ABNORMAL LOW (ref 36.0–46.0)
MCV: 77.6 fL — ABNORMAL LOW (ref 78.0–100.0)
RBC: 4.24 MIL/uL (ref 3.87–5.11)
WBC: 6.6 10*3/uL (ref 4.0–10.5)

## 2011-04-02 MED ORDER — HALOPERIDOL LACTATE 5 MG/ML IJ SOLN
5.0000 mg | INTRAMUSCULAR | Status: DC | PRN
  Administered 2011-04-02 – 2011-04-05 (×4): 5 mg via INTRAVENOUS
  Filled 2011-04-02 (×4): qty 1

## 2011-04-02 MED ORDER — CLONAZEPAM 0.5 MG PO TABS
0.5000 mg | ORAL_TABLET | Freq: Two times a day (BID) | ORAL | Status: DC | PRN
  Administered 2011-04-02 – 2011-04-06 (×3): 0.5 mg via ORAL
  Filled 2011-04-02 (×3): qty 1

## 2011-04-02 MED ORDER — RISPERIDONE 0.5 MG PO TABS
0.5000 mg | ORAL_TABLET | Freq: Two times a day (BID) | ORAL | Status: DC
  Administered 2011-04-02 – 2011-04-04 (×4): 0.5 mg via ORAL
  Filled 2011-04-02 (×5): qty 1

## 2011-04-02 MED ORDER — RISPERIDONE 1 MG PO TABS
1.0000 mg | ORAL_TABLET | Freq: Two times a day (BID) | ORAL | Status: DC
  Filled 2011-04-02 (×2): qty 1

## 2011-04-02 MED ORDER — HALOPERIDOL LACTATE 5 MG/ML IJ SOLN
INTRAMUSCULAR | Status: AC
  Filled 2011-04-02: qty 1

## 2011-04-02 NOTE — Progress Notes (Signed)
Report received by Alvis Lemmings, RN dayshift. I agree with initial assessment and will continue to monitor.

## 2011-04-02 NOTE — Progress Notes (Signed)
Dr. Darnelle Catalan asked CSW to contact Pt's son regarding SNF place.  Spoke with [name], Pt's HCPOA.  [name] asking if Jocquita heard back from other 2 facilities.  Explained that CSW doesn't have this information but that Jocquita will be working tomorrow and that CSW will ask Jocquita to contact him.  [name] stated that this would be fine and thanked CSW for assistance.  Providence Crosby, LCSWA Clinical Social Work [phone]

## 2011-04-02 NOTE — Progress Notes (Signed)
Pt becoming increasingly agitated & combative. Called pt's family to assist in calming down via phone call. Pt assisted to ambulate hallways w/ rolling walker to alleviate restlessness. Dr. Darnelle Catalan paged for notification & request.

## 2011-04-02 NOTE — Progress Notes (Signed)
Dr. Darnelle Catalan returned page promptly with orders received. Will implement new orders and continue to monitor's pt's safety.

## 2011-04-02 NOTE — Progress Notes (Addendum)
PATIENT DETAILS Name: Jenny Brady Age: 77 y.o. Sex: female Date of Birth: [birth year] Admit Date: 03/26/2011 VOZ:DGUYQIH, [name], MD, MD POA: [name] Jeffrey, cell [phone]  CONSULTS: 1.  Dr. Rollene Rotunda, Cardiology 2.  Mickeal Skinner, Psychiatry  Interval History: 77 y/o CF admitted with vague symptoms, but found to have EKG changes with possible NSTEMI vs LVH and repolarization changes on EKG. IV heparin started in ED but d/c'd after cardiac markers were cycled and found to be negative. V/Q scan low probability for pulmonary embolism. Responded well to IV lasix. Echo was done and showed severe wall motion anomaly, not likely candidate for operative or invasive therapy given her comorbidities. She appears to have advanced dementia and has remained agitated, and does have hoarding behavior and some confusion per her son, Jenny Brady. The plans at this point are for medical management. A PT/OT evaluation was done to determine her disposition, and at this point, 24-hour supervision or skilled nursing home placement is recommended.  She does have a 24-hour sitter and I am told that she will need to have this discontinued for acceptance to a facility.  She had an episode of severe agitation 03/31/11 prompting [place] to ask for psychiatry evaluation, which was done on 04/01/11.   ROS: Ms. Hottinger denies chest pain and dyspnea. She is sitting up in the chair.  Objective: Vital signs in last 24 hours: Temp:  [98.3 F (36.8 C)] 98.3 F (36.8 C) (12/23 0439) Pulse Rate:  [67-84] 78  (12/23 0439) Resp:  [18-20] 18  (12/23 0439) BP: (105-110)/(63-71) 105/63 mmHg (12/23 0439) SpO2:  [95 %-96 %] 95 % (12/23 0439) Weight:  [79.5 kg (175 lb 4.3 oz)] 175 lb 4.3 oz (79.5 kg) (12/23 0439) Weight change:  Last BM Date: 03/30/11  Intake/Output from previous day:  Intake/Output Summary (Last 24 hours) at 04/02/11 1119 Last data filed at 04/02/11 7564  Gross per 24 hour  Intake    600 ml    Output   1150 ml  Net   -550 ml     Physical Exam:  Gen:  No acute distress. Depressed affect. Cardiovascular:  Heart sounds regular with no murmurs, rubs, or gallops. Respiratory: Lungs clear to auscultation bilaterally. Gastrointestinal: Abdomen soft, nontender, nondistended with normal active bowel sounds. Extremities: No clubbing, edema, or cyanosis.    Lab Results: Basic Metabolic Panel:  Lab 03/30/11 3329 03/29/11 0456 03/28/11 0435 03/27/11 0537  NA 141 140 139 140  K 3.9 3.3* -- --  CL 104 102 104 103  CO2 30 28 30 28   GLUCOSE 138* 132* 127* 128*  BUN 25* 15 15 15   CREATININE 0.93 0.79 0.76 0.75  CALCIUM 9.1 8.8 8.6 8.9  MG -- -- -- --  PHOS -- -- -- --   GFR Estimated Creatinine Clearance: 48.8 ml/min (by C-G formula based on Cr of 0.93). Liver Function Tests: No results found for this basename: AST:5,ALT:5,ALKPHOS:5,BILITOT:5,PROT:5,ALBUMIN:5 in the last 168 hours Coagulation profile  Lab 03/27/11 0530  INR 1.16  PROTIME --    CBC:  Lab 04/02/11 0520 04/01/11 0520 03/31/11 0432 03/30/11 0405 03/29/11 0456  WBC 6.6 4.9 5.6 4.9 5.5  NEUTROABS -- -- -- -- --  HGB 10.3* 9.8* 10.0* 10.1* 10.2*  HCT 32.9* 31.1* 32.0* 32.6* 32.6*  MCV 77.6* 78.5 78.4 78.7 78.2  PLT 208 221 218 256 249   Cardiac Enzymes:  Lab 03/27/11 0530 03/27/11 03/26/11 1818  CKTOTAL 97 100 112  CKMB 3.5 3.6 3.9  CKMBINDEX -- -- --  TROPONINI <0.30 <0.30 <0.30     Ref. Range 03/26/2011 09:29  Pro B Natriuretic peptide (BNP) Latest Range: 0-450 pg/mL 3751.0 (H)    Ref. Range 03/30/2011 04:05  Pro B Natriuretic peptide (BNP) Latest Range: 0-450 pg/mL 3002.0 (H)    Ref. Range 03/30/2011 04:05  TSH Latest Range: 0.350-4.500 uIU/mL 0.507   Microbiology Recent Results (from the past 240 hour(s))  MRSA PCR SCREENING     Status: Normal   Collection Time   03/26/11  5:54 PM      Component Value Range Status Comment   MRSA by PCR NEGATIVE  NEGATIVE  Final     Recent Results  (from the past 240 hour(s))  MRSA PCR SCREENING     Status: Normal   Collection Time   03/26/11  5:54 PM      Component Value Range Status Comment   MRSA by PCR NEGATIVE  NEGATIVE  Final     Studies/Results: 1.  CXR 03/26/11: IMPRESSION: Cardiomegaly and pulmonary edema. Confluent edema versus infectious infiltrate in the left upper  Lobe. 2.  VQ Scan 03/25/22: IMPRESSION: Low probability for acute pulmonary embolus. 3.  2-D Echocardiogram 03/26/22 Study Conclusions  - Left ventricle: The cavity size was mildly dilated. Wall thickness was normal. Systolic function was severely reduced. The estimated ejection fraction was in the range of 25% to 30%. The anteroseptal and inferoseptal walls appear akinetic. The anterior and inferior walls appear severely hypokinetic. The lateral wall is relatively preserved. Doppler parameters are consistent with restrictive physiology, indicative of decreased left ventricular diastolic compliance and/or increased left atrial pressure. E/medial e' > 15 suggests LV end diastolic pressure at least 20 mmHg. - Aortic valve: There was no stenosis. - Mitral valve: Moderately calcified annulus. Moderate regurgitation. - Left atrium: The atrium was moderately dilated. - Right ventricle: The cavity size was normal. Systolic function was normal. - Right atrium: The atrium was mildly dilated. - Tricuspid valve: Moderate regurgitation. Peak RV-RA gradient:84mm Hg (S). - Pulmonary arteries: PA systolic pressure 65-69 mmHg. - Systemic veins: IVC measured 2.3 cm with normal respirophasic variation, suggesting RA pressure 6-10 mmHg. - Pericardium, extracardiac: There was a pleural effusion. Impressions:  - Mildly dilated LV with severely reduced systolic function, EF 25-30%. Wall motion abnormalities as listed above suggesting ischemic cardiomyopathy. Severe diastolic dysfunction with evidence for elevated LV filling pressure. Moderate to severe pulmonary  hypertension. Normal RV size and systolic function. Moderate MR, moderate TR.     Medications: Scheduled Meds:    . aspirin EC  81 mg Oral Daily  . carvedilol  6.25 mg Oral BID WC  . cholecalciferol  1,000 Units Oral Daily  . enalapril  2.5 mg Oral Daily  . famotidine  20 mg Oral Daily  . furosemide  40 mg Oral Daily  . levothyroxine  56 mcg Oral Daily  . memantine  5 mg Oral BID  .  morphine injection  4 mg Intravenous Once  . potassium chloride  20 mEq Oral BID  . risperiDONE  0.5 mg Oral BID  . DISCONTD: risperiDONE  1 mg Oral QHS  . DISCONTD: risperiDONE  1 mg Oral BID   Continuous Infusions:  PRN Meds:.acetaminophen, clonazePAM, meclizine, nitroGLYCERIN, ondansetron (ZOFRAN) IV, DISCONTD: ALPRAZolam, DISCONTD: LORazepam Antibiotics: Anti-infectives    None       Assessment/Plan:  Principal Problem:  *Systolic CHF, acute on chronic The patient was admitted and a diagnostic evaluation undertaken. An initial chest radiograph showed pulmonary edema and her pro BNP levels  were elevated. Cardiology was subsequently consult and and a two-dimensional echocardiogram was obtained which showed severe depression of LV function. She was put on diuretic therapy initially with subsequent improvement in her symptoms. An ACE inhibitor was added on 03/28/2011. Her Lasix dose was reduced 03/29/11. Cardiology indicates that there are no further plans to perform invasive studies given her comorbidities and that she should remain medically optimized. Her pro BNP has slowly come down.  Active Problems:  Syncope The patient's initial presenting complaint was one of syncope. She was not orthostatic.  A VQ scan was done which showed low probability for pulmonary embolism. A PT/OT evaluation indicates that the patient should have 24-hour supervision in the home or be placed in a skilled nursing home facility. Both the patient and the patient's son agreed to this plan of care.  SW is actively  working with the son to find a suitable facility.  DEMENTIA-mod-severe The patient has required Haldol for agitation and has exhibited paranoia and sundowning type behavior.  I spoke with patient's son about initiating therapy for symptom control with Risperdal and starting her on Namenda to help decrease the rate of progression of her dementia and we started these medications 03/30/11.  I spoke with her son about obtaining a psychiatry consultation 03/31/11, and he is agreeable. The consultation was done by Dr. Ferol Luz on 04/01/11 and her recommendations were to give BID low-dose Risperdal for agitation control and to continue Namenda. She also recommended discontinuation of Ativan and Xanax and to use when necessary Klonopin for breakthrough anxiety. CP-Pe vs ACS No evidence of a pulmonary embolism or acute coronary syndrome based on VQ scan and cardiac markers. Patient's chest pain was vague and was likely due to other causes including decompensated heart failure.  CAD (?stent) cath 90's [name] Patient had negative cardiac markers. No further invasive testing as recommended by cardiology. Continue aspirin and beta blocker therapy. Patient's lipids are well-controlled.  Hyperthyroidism-s/p RAI admin The patient remains on Synthroid for thyroid hormone replacement therapy. Patient's TSH was checked and found to be normal.  GERD (gastroesophageal reflux disease) The patient has been maintained on Pepcid for symptom control while in the hospital.  Normocytic anemia Likely secondary to chronic kidney disease.  Stage III chronic kidney disease The patient's renal function is currently stable.  Hypokalemia Secondary to diuretic therapy. Patient's potassium was replaced.  [name], Jenny Brady, was contacted by telephone and advised that his mother is now medically stable. The plan for discharge to a SNF for rehabilitation was reiterated and he remains agreeable to this plan although he has not  yet chosen a facility. I advised him that the social worker would contact him to discuss the options further.  LOS: 7 days   Hillery Aldo, MD Pager [phone]  04/02/2011, 11:19 AM

## 2011-04-03 LAB — CBC
HCT: 33.3 % — ABNORMAL LOW (ref 36.0–46.0)
Hemoglobin: 10.4 g/dL — ABNORMAL LOW (ref 12.0–15.0)
MCH: 24.4 pg — ABNORMAL LOW (ref 26.0–34.0)
MCHC: 31.2 g/dL (ref 30.0–36.0)

## 2011-04-03 NOTE — Progress Notes (Signed)
CSW spoke with pts son [name] on the phone and informed him that neither [facility] or [facility] are able to make a bed offer for the pt at this time. Pt son stated he wanted to know more about the cost of SNF. CSW requested he choose a facility so that someone in their admissions department could provide him with more insight of the cost and services provided with SNF. Pts son states he would like to explore pt discharging to [facility]. CSW has left a voice mail with Wille Celeste at the facility and will request she give pts son a call to discuss SNF. * Pls note sitter has to be discontinued for 24 hours for pt to go to SNF.  Patrice Paradise, LCSWA 04/03/2011 10:07 AM [phone]

## 2011-04-03 NOTE — Progress Notes (Signed)
PATIENT DETAILS Name: Jenny Brady Age: 77 y.o. Sex: female Date of Birth: 1926/01/07 Admit Date: 03/26/2011 ZOX:WRUEAVW, Jenny Ammons, MD, MD POA: Jenny Brady, cell [phone]  CONSULTS: 1.  Dr. Rollene Rotunda, Cardiology 2.  Mickeal Skinner, Psychiatry  Interval History: 77 y/o CF admitted with vague symptoms, but found to have EKG changes with possible NSTEMI vs LVH and repolarization changes on EKG. IV heparin started in ED but d/c'd after cardiac markers were cycled and found to be negative. V/Q scan low probability for pulmonary embolism. Responded well to IV lasix. Echo was done and showed severe wall motion anomaly, not likely candidate for operative or invasive therapy given her comorbidities. She appears to have advanced dementia and has remained agitated, and does have hoarding behavior and some confusion per her son, Jenny Brady. The plans at this point are for medical management. A PT/OT evaluation was done to determine her disposition, and at this point, 24-hour supervision or skilled nursing home placement is recommended.  She does have a 24-hour sitter and I am told that she will need to have this discontinued for acceptance to a facility.  She had an episode of severe agitation 03/31/11 prompting [place] to ask for psychiatry evaluation, which was done on 04/01/11.   ROS: [name] denies chest pain and dyspnea. She still has a Comptroller, and other than some brief agitation when working with PT, has been calm through the day.  She is pleasant and conversant.  Objective: Vital signs in last 24 hours: Temp:  [97.6 F (36.4 C)-98.1 F (36.7 C)] 98.1 F (36.7 C) (12/24 1300) Pulse Rate:  [75-98] 89  (12/24 1300) Resp:  [16-20] 16  (12/24 1300) BP: (102-107)/(64-68) 102/64 mmHg (12/24 1300) SpO2:  [96 %-99 %] 99 % (12/24 1300) Weight:  [78.8 kg (173 lb 11.6 oz)] 173 lb 11.6 oz (78.8 kg) (12/24 0448) Weight change: -0.7 kg (-1 lb 8.7 oz) Last BM Date: 03/30/11  Intake/Output  from previous day:  Intake/Output Summary (Last 24 hours) at 04/03/11 1629 Last data filed at 04/03/11 1300  Gross per 24 hour  Intake    360 ml  Output    601 ml  Net   -241 ml     Physical Exam:  Gen:  No acute distress.  Cardiovascular:  Heart sounds regular with no murmurs, rubs, or gallops. Respiratory: Lungs clear to auscultation bilaterally. Gastrointestinal: Abdomen soft, nontender, nondistended with normal active bowel sounds. Extremities: No clubbing, edema, or cyanosis.    Lab Results: Basic Metabolic Panel:  Lab 03/30/11 2956 03/29/11 0456 03/28/11 0435  NA 141 140 139  K 3.9 3.3* --  CL 104 102 104  CO2 30 28 30   GLUCOSE 138* 132* 127*  BUN 25* 15 15  CREATININE 0.93 0.79 0.76  CALCIUM 9.1 8.8 8.6  MG -- -- --  PHOS -- -- --   GFR Estimated Creatinine Clearance: 48.6 ml/min (by C-G formula based on Cr of 0.93). Liver Function Tests: No results found for this basename: AST:5,ALT:5,ALKPHOS:5,BILITOT:5,PROT:5,ALBUMIN:5 in the last 168 hours Coagulation profile No results found for this basename: INR:5,PROTIME:5 in the last 168 hours  CBC:  Lab 04/03/11 0448 04/02/11 0520 04/01/11 0520 03/31/11 0432 03/30/11 0405  WBC 5.9 6.6 4.9 5.6 4.9  NEUTROABS -- -- -- -- --  HGB 10.4* 10.3* 9.8* 10.0* 10.1*  HCT 33.3* 32.9* 31.1* 32.0* 32.6*  MCV 78.2 77.6* 78.5 78.4 78.7  PLT 214 208 221 218 256   Cardiac Enzymes: No results found for  this basename: CKTOTAL:5,CKMB:5,CKMBINDEX:5,TROPONINI:5 in the last 168 hours   Ref. Range 03/26/2011 09:29  Pro B Natriuretic peptide (BNP) Latest Range: 0-450 pg/mL 3751.0 (H)    Ref. Range 03/30/2011 04:05  Pro B Natriuretic peptide (BNP) Latest Range: 0-450 pg/mL 3002.0 (H)    Ref. Range 03/30/2011 04:05  TSH Latest Range: 0.350-4.500 uIU/mL 0.507   Microbiology Recent Results (from the past 240 hour(s))  MRSA PCR SCREENING     Status: Normal   Collection Time   03/26/11  5:54 PM      Component Value Range Status  Comment   MRSA by PCR NEGATIVE  NEGATIVE  Final     Recent Results (from the past 240 hour(s))  MRSA PCR SCREENING     Status: Normal   Collection Time   03/26/11  5:54 PM      Component Value Range Status Comment   MRSA by PCR NEGATIVE  NEGATIVE  Final     Studies/Results: 1.  CXR 03/26/11: IMPRESSION: Cardiomegaly and pulmonary edema. Confluent edema versus infectious infiltrate in the left upper  Lobe. 2.  VQ Scan 03/25/22: IMPRESSION: Low probability for acute pulmonary embolus. 3.  2-D Echocardiogram 03/26/22 Study Conclusions  - Left ventricle: The cavity size was mildly dilated. Wall thickness was normal. Systolic function was severely reduced. The estimated ejection fraction was in the range of 25% to 30%. The anteroseptal and inferoseptal walls appear akinetic. The anterior and inferior walls appear severely hypokinetic. The lateral wall is relatively preserved. Doppler parameters are consistent with restrictive physiology, indicative of decreased left ventricular diastolic compliance and/or increased left atrial pressure. E/medial e' > 15 suggests LV end diastolic pressure at least 20 mmHg. - Aortic valve: There was no stenosis. - Mitral valve: Moderately calcified annulus. Moderate regurgitation. - Left atrium: The atrium was moderately dilated. - Right ventricle: The cavity size was normal. Systolic function was normal. - Right atrium: The atrium was mildly dilated. - Tricuspid valve: Moderate regurgitation. Peak RV-RA gradient:63mm Hg (S). - Pulmonary arteries: PA systolic pressure 65-69 mmHg. - Systemic veins: IVC measured 2.3 cm with normal respirophasic variation, suggesting RA pressure 6-10 mmHg. - Pericardium, extracardiac: There was a pleural effusion. Impressions:  - Mildly dilated LV with severely reduced systolic function, EF 25-30%. Wall motion abnormalities as listed above suggesting ischemic cardiomyopathy. Severe diastolic dysfunction with  evidence for elevated LV filling pressure. Moderate to severe pulmonary hypertension. Normal RV size and systolic function. Moderate MR, moderate TR.     Medications: Scheduled Meds:    . aspirin EC  81 mg Oral Daily  . carvedilol  6.25 mg Oral BID WC  . cholecalciferol  1,000 Units Oral Daily  . enalapril  2.5 mg Oral Daily  . famotidine  20 mg Oral Daily  . furosemide  40 mg Oral Daily  . haloperidol lactate      . levothyroxine  56 mcg Oral Daily  . memantine  5 mg Oral BID  .  morphine injection  4 mg Intravenous Once  . potassium chloride  20 mEq Oral BID  . risperiDONE  0.5 mg Oral BID   Continuous Infusions:  PRN Meds:.acetaminophen, clonazePAM, haloperidol lactate, meclizine, nitroGLYCERIN, ondansetron (ZOFRAN) IV Antibiotics: Anti-infectives    None       Assessment/Plan:  Principal Problem:  *Systolic CHF, acute on chronic The patient was admitted and a diagnostic evaluation undertaken. An initial chest radiograph showed pulmonary edema and her pro BNP levels were elevated. Cardiology was subsequently consult and and a two-dimensional  echocardiogram was obtained which showed severe depression of LV function. She was put on diuretic therapy initially with subsequent improvement in her symptoms. An ACE inhibitor was added on 03/28/2011. Her Lasix dose was reduced 03/29/11. Cardiology indicates that there are no further plans to perform invasive studies given her comorbidities and that she should remain medically optimized. Her pro BNP has slowly come down, and she has remained clinically stable. Active Problems:  Syncope The patient's initial presenting complaint was one of syncope. She was not orthostatic.  A VQ scan was done which showed low probability for pulmonary embolism. A PT/OT evaluation indicates that the patient should have 24-hour supervision in the home or be placed in a skilled nursing home facility. Both the patient and the patient's son agreed to this  plan of care.  SW is actively working with the son to find a suitable facility.  DEMENTIA-mod-severe The patient has required Haldol for agitation and has exhibited paranoia and sundowning type behavior.  I spoke with patient's son about initiating therapy for symptom control with Risperdal and starting her on Namenda to help decrease the rate of progression of her dementia and we started these medications 03/30/11.  I spoke with her son about obtaining a psychiatry consultation 03/31/11, and he is agreeable. The consultation was done by Dr. Ferol Luz on 04/01/11 and her recommendations were to give BID low-dose Risperdal for agitation control and to continue Namenda. She also recommended discontinuation of Ativan and Xanax and to use when necessary Klonopin for breakthrough anxiety.  Will try to d/c the sitter 04/04/11. CP-Pe vs ACS No evidence of a pulmonary embolism or acute coronary syndrome based on VQ scan and cardiac markers. Patient's chest pain was vague and was likely due to other causes including decompensated heart failure.  CAD (?stent) cath 90's [name] Patient had negative cardiac markers. No further invasive testing as recommended by cardiology. Continue aspirin and beta blocker therapy. Patient's lipids are well-controlled.  Hyperthyroidism-s/p RAI admin The patient remains on Synthroid for thyroid hormone replacement therapy. Patient's TSH was checked and found to be normal.  GERD (gastroesophageal reflux disease) The patient has been maintained on Pepcid for symptom control while in the hospital.  Normocytic anemia Likely secondary to chronic kidney disease.  Stage III chronic kidney disease The patient's renal function is currently stable.  Hypokalemia Secondary to diuretic therapy. Patient's potassium was replaced.   LOS: 8 days   Hillery Aldo, MD Pager [phone]  04/03/2011, 4:29 PM

## 2011-04-03 NOTE — Progress Notes (Signed)
Physical Therapy Treatment Patient Details Name: Jenny Brady MRN: [id number] DOB: 10-29-1925 Today's Date: 04/03/2011 7829-5621 1gt 1te PT Assessment/Plan  PT - Assessment/Plan Comments on Treatment Session: pt cooperative this am PT Plan: Discharge plan remains appropriate PT Frequency: Min 3X/week Follow Up Recommendations: Home health PT;24 hour supervision/assistance;Skilled nursing facility Equipment Recommended: Defer to next venue PT Goals  Acute Rehab PT Goals PT Goal: Sit to Stand - Progress: Progressing toward goal PT Goal: Ambulate - Progress: Progressing toward goal  PT Treatment Precautions/Restrictions  Precautions Precautions: Fall Restrictions Weight Bearing Restrictions: No Mobility (including Balance) Bed Mobility Supine to Sit: 5: Supervision;HOB elevated (Comment degrees) (30) Supine to Sit Details (indicate cue type and reason): increase time, cues to complete task Sitting - Scoot to Edge of Bed: 4: Min assist Sitting - Scoot to [place] of Bed Details (indicate cue type and reason): cues UE use Transfers Sit to Stand:  (min/guard) Sit to Stand Details (indicate cue type and reason): cues for hand placement Stand to Sit:  (min/guard) Stand to Sit Details: cues for safety, back up to chair Ambulation/Gait Ambulation/Gait Assistance: 4: Min assist Ambulation/Gait Assistance Details (indicate cue type and reason): min assist for RW position, cues step into RW,  Ambulation Distance (Feet): 160 Feet Assistive device: Rolling walker Gait Pattern: Step-through pattern    Exercise  Total Joint Exercises Ankle Circles/Pumps: AROM;Both;10 reps Quad Sets: AROM;10 reps;Both Short Arc Quad: AROM;Both;10 reps Heel Slides: AROM;10 reps;Both Hip ABduction/ADduction: AROM;10 reps;Both End of Session PT - End of Session Equipment Utilized During Treatment: Gait belt Activity Tolerance: Patient tolerated treatment well Patient left: in chair (sitter  present) General Behavior During Session: [facility] for tasks performed Cognition: Impaired, at baseline  [facility] 04/03/2011, 9:40 AM

## 2011-04-03 NOTE — Progress Notes (Signed)
Occupational Therapy Evaluation Patient Details Name: OMARI KOSLOSKY MRN: [id number] DOB: 1925-12-15 Today's Date: 04/03/2011 11:22-11:39  1sc OT Assessment/Plan OT Assessment/Plan Comments on Treatment Session: Pt participated well at an overall supervision level for simulated toileting and grooming tasks.  She demonstrates increased balance and ability to perform sit to stand from variable surfaces including EOB and comfort height toilet. She was also able to reach the floor in standing to pick up a paper towel without LOB.  OT Plan: Discharge plan remains appropriate OT Frequency: Min 2X/week Follow Up Recommendations: Skilled nursing facility Equipment Recommended: Defer to next venue OT Goals ADL Goals ADL Goal: Lower Body Bathing - Progress: Progressing toward goals ADL Goal: Lower Body Dressing - Progress: Met ADL Goal: Toilet Transfer - Progress: Met ADL Goal: Toileting - Clothing Manipulation - Progress: Met ADL Goal: Toileting - Hygiene - Progress: Met  OT Treatment Precautions/Restrictions  Precautions Precautions: Fall   ADL ADL Eating/Feeding: Not assessed Grooming: Performed;Supervision/safety Where Assessed - Grooming: Standing at sink Lower Body Bathing: Not assessed Upper Body Dressing: Not assessed Lower Body Dressing: Supervision/safety Lower Body Dressing Details (indicate cue type and reason): Pt able to sit EOB and reach down to donn gripper socks. Where Assessed - Lower Body Dressing: Unsupported;Sitting, bed Toilet Transfer: Performed;Supervision/safety Toilet Transfer Details (indicate cue type and reason): Pt able to perform sit to stand from comfort height toilet without use of UE support. Toilet Transfer Method: Proofreader: Comfort height toilet Toileting - Clothing Manipulation: Simulated;Supervision/safety Where Assessed - Glass blower/designer Manipulation: Sit to stand from 3-in-1 or toilet Toileting - Hygiene:  Simulated;Supervision/safety Where Assessed - Toileting Hygiene: Sit to stand from 3-in-1 or toilet Tub/Shower Transfer: Not assessed Tub/Shower Transfer Method: Not assessed ADL Comments: Pt needs min instructional cues to sequence through selfcare tasks.  During grooming pt would ask therapist what to do next for re-assurance that she was perfroming the tasks correctly. Overall supervision for balance during all functional transfers. Mobility  Bed Mobility Bed Mobility: No Supine to Sit: 5: Supervision;HOB elevated (Comment degrees) (30) Supine to Sit Details (indicate cue type and reason): increase time, cues to complete task Sitting - Scoot to Edge of Bed: 4: Min assist Sitting - Scoot to [place] of Bed Details (indicate cue type and reason): cues UE use Transfers Sit to Stand:  (min/guard) Sit to Stand Details (indicate cue type and reason): cues for hand placement Stand to Sit:  (min/guard) Stand to Sit Details: cues for safety, back up to chair Exercises Total Joint Exercises Ankle Circles/Pumps: AROM;Both;10 reps Quad Sets: AROM;10 reps;Both Short Arc Quad: AROM;Both;10 reps Heel Slides: AROM;10 reps;Both Hip ABduction/ADduction: AROM;10 reps;Both  End of Session OT - End of Session Activity Tolerance: Patient tolerated treatment well Patient left: in chair;with family/visitor present General Behavior During Session: [facility] for tasks performed Cognition: [facility] for tasks performed  Balin Vandegrift OTR/L 04/03/2011, 12:23 PM Pager number [phone]

## 2011-04-03 NOTE — Progress Notes (Signed)
Patient became agitated and trying to leave the hospital through the exits.  Gave patient Haldol and patient calmed down and returned to room.

## 2011-04-04 LAB — CBC
HCT: 32.8 % — ABNORMAL LOW (ref 36.0–46.0)
MCH: 24.5 pg — ABNORMAL LOW (ref 26.0–34.0)
MCV: 78.1 fL (ref 78.0–100.0)
Platelets: 217 10*3/uL (ref 150–400)
RBC: 4.2 MIL/uL (ref 3.87–5.11)
RDW: 14.2 % (ref 11.5–15.5)

## 2011-04-04 MED ORDER — RISPERIDONE 1 MG PO TABS
1.0000 mg | ORAL_TABLET | Freq: Two times a day (BID) | ORAL | Status: DC
  Administered 2011-04-04 – 2011-04-06 (×4): 1 mg via ORAL
  Filled 2011-04-04 (×7): qty 1

## 2011-04-04 NOTE — Progress Notes (Signed)
PATIENT DETAILS Name: Jenny Brady Age: 77 y.o. Sex: female Date of Birth: 11/01/1925 Admit Date: 03/26/2011 [id number], Jenny Ammons, MD, MD POA: [name] Archambeault, cell [phone]  CONSULTS: 1.  Dr. Rollene Rotunda, Cardiology 2.  Mickeal Skinner, Psychiatry  Interval History: 77 y/o CF admitted with vague symptoms, but found to have EKG changes with 77 y/o CF admitted with vague symptoms, but found to have EKG changes with possible NSTEMI vs LVH and repolarization changes on EKG. IV heparin started in ED but d/c'd after cardiac markers were cycled and found to be negative. V/Q scan low probability for pulmonary embolism. Responded well to IV lasix. Echo was done and showed severe wall motion anomaly, not likely candidate for operative or invasive therapy given her comorbidities. She appears to have advanced dementia and has remained agitated, and does have hoarding behavior and some confusion per her son, Jenny Brady. The plans at this point are for medical management. A PT/OT evaluation was done to determine her disposition, and at this point, 24-hour supervision or skilled nursing home placement is recommended.  She does have a 24-hour sitter and I am told that she will need to have this discontinued for acceptance to a facility.  She had an episode of severe agitation 03/31/11 prompting [place] to ask for psychiatry evaluation, which was done on 04/01/11.   ROS: Jenny Brady denies chest pain and dyspnea. She had an episode of agitation and had to be sedated with Haldol last evening.   Objective: Vital signs in last 24 hours: Temp:  [97.7 F (36.5 C)-98.1 F (36.7 C)] 98 F (36.7 C) (12/25 0550) Pulse Rate:  [76-93] 93  (12/25 0550) Resp:  [16-21] 21  (12/25 0550) BP: (91-102)/(54-64) 91/54 mmHg (12/25 0550) SpO2:  [93 %-99 %] 93 % (12/25 0550) Weight:  [78.835 kg (173 lb 12.8 oz)] 173 lb 12.8 oz (78.835 kg) (12/25 0550) Weight change: 0.035 kg (1.2 oz) Last BM Date: 03/30/11  Intake/Output from previous day:  Intake/Output Summary (Last 24 hours) at  04/04/11 1151 Last data filed at 04/04/11 0900  Gross per 24 hour  Intake    700 ml  Output    726 ml  Net    -26 ml     Physical Exam:  Gen:  No acute distress.  Cardiovascular:  Heart sounds regular with no murmurs, rubs, or gallops. Respiratory: Lungs clear to auscultation bilaterally. Gastrointestinal: Abdomen soft, nontender, nondistended with normal active bowel sounds. Extremities: No clubbing, edema, or cyanosis.    Lab Results: Basic Metabolic Panel:  Lab 03/30/11 8413 03/29/11 0456  NA 141 140  K 3.9 3.3*  CL 104 102  CO2 30 28  GLUCOSE 138* 132*  BUN 25* 15  CREATININE 0.93 0.79  CALCIUM 9.1 8.8  MG -- --  PHOS -- --   GFR Estimated Creatinine Clearance: 48.6 ml/min (by C-G formula based on Cr of 0.93).  CBC:  Lab 04/04/11 0800 04/03/11 0448 04/02/11 0520 04/01/11 0520 03/31/11 0432  WBC 5.3 5.9 6.6 4.9 5.6  NEUTROABS -- -- -- -- --  HGB 10.3* 10.4* 10.3* 9.8* 10.0*  HCT 32.8* 33.3* 32.9* 31.1* 32.0*  MCV 78.1 78.2 77.6* 78.5 78.4  PLT 217 214 208 221 218   Cardiac Enzymes: No results found for this basename: CKTOTAL:5,CKMB:5,CKMBINDEX:5,TROPONINI:5 in the last 168 hours   Ref. Range 03/26/2011 09:29  Pro B Natriuretic peptide (BNP) Latest Range: 0-450 pg/mL 3751.0 (H)    Ref. Range 03/30/2011 04:05  Pro B Natriuretic peptide (BNP) Latest Range: 0-450 pg/mL 3002.0 (H)    Ref. Range 03/30/2011 04:05  TSH Latest Range: 0.350-4.500 uIU/mL 0.507   Microbiology Recent Results (from the past 240 hour(s))  MRSA PCR SCREENING     Status: Normal   Collection Time   03/26/11  5:54 PM      Component Value Range Status Comment   MRSA by PCR NEGATIVE  NEGATIVE  Final     Recent Results (from the past 240 hour(s))  MRSA PCR SCREENING     Status: Normal   Collection Time   03/26/11  5:54 PM      Component Value Range Status Comment   MRSA by PCR NEGATIVE  NEGATIVE  Final     Studies/Results: 1.  CXR 03/26/11: IMPRESSION: Cardiomegaly and  pulmonary edema. Confluent edema versus infectious infiltrate in the left upper  Lobe. 2.  VQ Scan 03/25/22: IMPRESSION: Low probability for acute pulmonary embolus. 3.  2-D Echocardiogram 03/26/22 Study Conclusions  - Left ventricle: The cavity size was mildly dilated. Wall thickness was normal. Systolic function was severely reduced. The estimated ejection fraction was in the range of 25% to 30%. The anteroseptal and inferoseptal walls appear akinetic. The anterior and inferior walls appear severely hypokinetic. The lateral wall is relatively preserved. Doppler parameters are consistent with restrictive physiology, indicative of decreased left ventricular diastolic compliance and/or increased left atrial pressure. E/medial e' > 15 suggests LV end diastolic pressure at least 20 mmHg. - Aortic valve: There was no stenosis. - Mitral valve: Moderately calcified annulus. Moderate regurgitation. - Left atrium: The atrium was moderately dilated. - Right ventricle: The cavity size was normal. Systolic function was normal. - Right atrium: The atrium was mildly dilated. - Tricuspid valve: Moderate regurgitation. Peak RV-RA gradient:34mm Hg (S). - Pulmonary arteries: PA systolic pressure 65-69 mmHg. - Systemic veins: IVC measured 2.3 cm with normal respirophasic variation, suggesting RA pressure 6-10 mmHg. - Pericardium, extracardiac: There was a pleural effusion. Impressions:  - Mildly dilated LV with severely reduced systolic function, EF 25-30%. Wall motion abnormalities as listed above suggesting ischemic cardiomyopathy. Severe diastolic dysfunction with evidence for elevated LV filling pressure. Moderate to severe pulmonary hypertension. Normal RV size and systolic function. Moderate MR, moderate TR.     Medications: Scheduled Meds:    . aspirin EC  81 mg Oral Daily  . carvedilol  6.25 mg Oral BID WC  . cholecalciferol  1,000 Units Oral Daily  . enalapril  2.5 mg Oral  Daily  . famotidine  20 mg Oral Daily  . furosemide  40 mg Oral Daily  . levothyroxine  56 mcg Oral Daily  . memantine  5 mg Oral BID  .  morphine injection  4 mg Intravenous Once  . potassium chloride  20 mEq Oral BID  . risperiDONE  0.5 mg Oral BID   Continuous Infusions:  PRN Meds:.acetaminophen, clonazePAM, haloperidol lactate, meclizine, nitroGLYCERIN, ondansetron (ZOFRAN) IV Antibiotics: Anti-infectives    None       Assessment/Plan:  Principal Problem:  *Systolic CHF, acute on chronic The patient was admitted and a diagnostic evaluation undertaken. An initial chest radiograph showed pulmonary edema and her pro BNP levels were elevated. Cardiology was subsequently consult and and a two-dimensional echocardiogram was obtained which showed severe depression of LV function. She was put on diuretic therapy initially with subsequent improvement in her symptoms. An ACE inhibitor was added on 03/28/2011. Her Lasix dose was reduced 03/29/11. Cardiology indicates that there are no further plans to perform invasive studies given her comorbidities and that she should remain medically optimized. Her pro BNP  has slowly come down, and she has remained clinically stable. Active Problems:  Syncope The patient's initial presenting complaint was one of syncope. She was not orthostatic.  A VQ scan was done which showed low probability for pulmonary embolism. A PT/OT evaluation indicates that the patient should have 24-hour supervision in the home or be placed in a skilled nursing home facility. Both the patient and the patient's son agreed to this plan of care.  SW is actively working with the son to find a suitable facility.  DEMENTIA-mod-severe The patient has required Haldol for agitation and has exhibited paranoia and sundowning type behavior.  I spoke with patient's son about initiating therapy for symptom control with Risperdal and starting her on Namenda to help decrease the rate of progression  of her dementia and we started these medications 03/30/11.  I spoke with her son about obtaining a psychiatry consultation 03/31/11, and he is agreeable. The consultation was done by Dr. Ferol Luz on 04/01/11 and her recommendations were to give BID low-dose Risperdal for agitation control and to continue Namenda. She also recommended discontinuation of Ativan and Xanax and to use when necessary Klonopin for breakthrough anxiety.  We have been unable to d/c the sitter secondary to the patient's intermittent agitation.  I will increase her risperdal, but may need to have psychiatry re-assess her for symptom management if this is ineffective.   CP-Pe vs ACS No evidence of a pulmonary embolism or acute coronary syndrome based on VQ scan and cardiac markers. Patient's chest pain was vague and was likely due to other causes including decompensated heart failure.  CAD (?stent) cath 90's [name] Patient had negative cardiac markers. No further invasive testing as recommended by cardiology. Continue aspirin and beta blocker therapy. Patient's lipids are well-controlled.  Hyperthyroidism-s/p RAI admin The patient remains on Synthroid for thyroid hormone replacement therapy. Patient's TSH was checked and found to be normal.  GERD (gastroesophageal reflux disease) The patient has been maintained on Pepcid for symptom control while in the hospital.  Normocytic anemia Likely secondary to chronic kidney disease.  Stage III chronic kidney disease The patient's renal function is currently stable.  Hypokalemia Secondary to diuretic therapy. Patient's potassium was replaced. Disposition [name], Jenny Brady, has not yet picked a SNF.  She will need to have the sitter d/c'd x 24 hours prior to d/c, which, unfortunately, has not yet been done secondary to the patient's agitation.     LOS: 9 days   Hillery Aldo, MD Pager [phone]  04/04/2011, 11:51 AM

## 2011-04-05 NOTE — Progress Notes (Signed)
Subjective: Comfortable, denies any new complaints.   Objective: Weight change: -0.62 kg (-1 lb 5.9 oz)  Intake/Output Summary (Last 24 hours) at 04/05/11 1514 Last data filed at 04/05/11 1200  Gross per 24 hour  Intake    780 ml  Output    200 ml  Net    580 ml   Physical Exam:  Gen: No acute distress.  Cardiovascular: Heart sounds regular with no murmurs, rubs, or gallops.  Respiratory: Lungs clear to auscultation bilaterally.  Gastrointestinal: Abdomen soft, nontender, nondistended with normal active bowel sounds.  Extremities: No clubbing, edema, or cyanosis.   Lab Results: No results found for this or any previous visit (from the past 24 hour(s)).   Micro Results: Recent Results (from the past 240 hour(s))  MRSA PCR SCREENING     Status: Normal   Collection Time   03/26/11  5:54 PM      Component Value Range Status Comment   MRSA by PCR NEGATIVE  NEGATIVE  Final     Studies/Results: Nm Pulmonary Per & Vent  03/26/2011  *RADIOLOGY REPORT*  Clinical Data:  Chest pain  NUCLEAR MEDICINE VENTILATION - PERFUSION LUNG SCAN  Technique:  Wash-in, equilibrium, and wash-out phase ventilation images were obtained using Xe-133 gas.  Perfusion images were obtained in multiple projections after intravenous injection of Tc- 26m MAA.  Radiopharmaceuticals:  10 mCi Xe-133 gas and 4.9 mCi Tc-34m MAA.  Comparison:  None  Findings: On the perfusion portion of the examination there are no medium are large size segmental perfusion defects identified.  The ventilation portion of the examination shows a relative uniform distribution of xenon tracer to both lungs.  No abnormal xenon tracer retention on the washout images.  IMPRESSION:  1.  Low probability for acute pulmonary embolus.  Original Report Authenticated By: Rosealee Albee, M.D.   Dg Chest Portable 1 View  03/26/2011  *RADIOLOGY REPORT*  Clinical Data: Chest pain.  History of heart stents.  History diabetes.  PORTABLE CHEST - 1 VIEW   Comparison: 02/08/2008  Findings: The heart is enlarged.  There are interstitial changes consistent with pulmonary edema.  More focal opacity the left upper lobe may be representative of confluent edema but infectious infiltrate cannot be excluded.  There may be small bilateral pleural effusions.  Degenerative changes are seen in the spine.  IMPRESSION:  1.  Cardiomegaly and pulmonary edema. 2.  Confluent edema versus infectious infiltrate in the left upper lobe.  Original Report Authenticated By: Patterson Hammersmith, M.D.   Medications: Scheduled Meds:   . aspirin EC  81 mg Oral Daily  . carvedilol  6.25 mg Oral BID WC  . cholecalciferol  1,000 Units Oral Daily  . enalapril  2.5 mg Oral Daily  . famotidine  20 mg Oral Daily  . furosemide  40 mg Oral Daily  . levothyroxine  56 mcg Oral Daily  . memantine  5 mg Oral BID  .  morphine injection  4 mg Intravenous Once  . potassium chloride  20 mEq Oral BID  . risperiDONE  1 mg Oral BID   Continuous Infusions:  PRN Meds:.acetaminophen, clonazePAM, haloperidol lactate, meclizine, nitroGLYCERIN, ondansetron (ZOFRAN) IV  Assessment/Plan:  LOS: 10 days  *Systolic CHF, acute on chronic  The patient was admitted and a diagnostic evaluation undertaken. An initial chest radiograph showed pulmonary edema and her pro BNP levels were elevated. Cardiology was subsequently consult and and a two-dimensional echocardiogram was obtained which showed severe depression of LV function. She  was put on diuretic therapy initially with subsequent improvement in her symptoms. An ACE inhibitor was added on 03/28/2011. Her Lasix dose was reduced 03/29/11. Cardiology indicates that there are no further plans to perform invasive studies given her comorbidities and that she should remain medically optimized. Her pro BNP has slowly come down, and she has remained clinically stable.  Active Problems:  Syncope  The patient's initial presenting complaint was one of syncope. She  was not orthostatic. A VQ scan was done which showed low probability for pulmonary embolism. A PT/OT evaluation indicates that the patient should have 24-hour supervision in the home or be placed in a skilled nursing home facility.Initially the patient and the patient's son agreed to this plan of care, but today pt  Wants to go home.   DEMENTIA-mod-severe  The patient has required Haldol for agitation and has exhibited paranoia and sundowning type behavior. I spoke with patient's son about initiating therapy for symptom control with Risperdal and starting her on Namenda to help decrease the rate of progression of her dementia and we started these medications 03/30/11. I spoke with her son about obtaining a psychiatry consultation 03/31/11, and he is agreeable. The consultation was done by Dr. Ferol Luz on 04/01/11 and her recommendations were to give BID low-dose Risperdal for agitation control and to continue Namenda. She also recommended discontinuation of Ativan and Xanax and to use when necessary Klonopin for breakthrough anxiety. Will ;try to d/c the sitter today and see how she does.    CP-Pe vs ACS  No evidence of a pulmonary embolism or acute coronary syndrome based on VQ scan and cardiac markers. Patient's chest pain was vague and was likely due to other causes including decompensated heart failure.  CAD (?stent) cath 90's [name]  Patient had negative cardiac markers. No further invasive testing as recommended by cardiology. Continue aspirin and beta blocker therapy. Patient's lipids are well-controlled.  Hyperthyroidism-s/p RAI admin  The patient remains on Synthroid for thyroid hormone replacement therapy. Patient's TSH was checked and found to be normal.  GERD (gastroesophageal reflux disease)  The patient has been maintained on Pepcid for symptom control while in the hospital.  Normocytic anemia  Likely secondary to chronic kidney disease.  Stage III chronic kidney disease  The  patient's renal function is currently stable.  Hypokalemia  Secondary to diuretic therapy. Patient's potassium was replaced.  Disposition  Possible SNF vs Home with home pt/ot/RN. Marland Kitchen   Jiro Kiester 04/05/2011, 3:14 PM

## 2011-04-05 NOTE — Progress Notes (Signed)
PT D/C note:  Pt to discharge from therapy at this time due to meeting/partly meeting all stated goals. Pt unable to meet Mod I goals due to need for 24/7 supervision for increased safety and decreased cognition.   Thanks,  Clovia Cuff, PT

## 2011-04-05 NOTE — Progress Notes (Addendum)
Physical Therapy Treatment Patient Details Name: Jenny Brady MRN: [id number] DOB: Jul 03, 1925 Today's Date: 04/05/2011  [id number] Jenny Brady PT Assessment/Plan  PT - Assessment/Plan Comments on Treatment Session: Pt hesitant to participate in therapy during todays session.  Pt agreed to exercises in bed with PT expressing benefits of ambulation.  Pt agreed to ambulation x 400' with RW.  Sitter states that pt is able to move around independently in room.  Per PT assessment, pt to D/C from therapy stand point at this time due to all goals met.  Pt D/C disposition remains unclear due to patient requiring 24/7 supervision for increased safety.   PT Plan: Discharge plan remains appropriate PT Frequency: Min 3X/week Follow Up Recommendations: Home health PT;24 hour supervision/assistance;Skilled nursing facility Equipment Recommended: Defer to next venue PT Goals  Acute Rehab PT Goals PT Goal Formulation: With patient Pt will go Supine/Side to Sit: with supervision Pt will go Sit to Stand: with modified independence Pt will Ambulate: >150 feet;with modified independence;with least restrictive assistive device Pt has met all supervision goals.  Unable to meet Mod I goals due to decreased cognition.   PT Treatment Precautions/Restrictions  Precautions Precautions: Fall Restrictions Weight Bearing Restrictions: No Mobility (including Balance) Bed Mobility Bed Mobility: Yes Supine to Sit: 5: Supervision;HOB elevated (Comment degrees) Sitting - Scoot to Edge of Bed: 5: Supervision Transfers Transfers: Yes Sit to Stand: 4: Min assist;With upper extremity assist;From bed Sit to Stand comments: Required cues for hand placement and some assist to get to upright position.  Stand to Sit: 5: Supervision;With upper extremity assist;To chair/3-in-1 Stand to sit comments: Required cues for safety and hand placement. Ambulation/Gait Ambulation/Gait: Yes Ambulation Distance: 400' Ambulation comments:  Required cues for safety, technique and sequencing with RW, and cues for upright posture.  Ambulation/Gait Assistance: 5: Supervision Assistive device: Rolling walker Gait Pattern: Step-through pattern Gait velocity: slow cadence  Posture/Postural Control Posture/Postural Control: No significant limitations Exercise  Total Joint Exercises Ankle Circles/Pumps: AROM;Both;20 reps;Supine Quad Sets: AROM;Both;15 reps;Supine Short Arc Quad: AROM;Both;Supine;Other reps (comment) (15 reps) Heel Slides: AROM;Both;15 reps;Supine Hip ABduction/ADduction: AROM;Both;15 reps;Supine Straight Leg Raises: AROM;Both;15 reps;Supine End of Session PT - End of Session Activity Tolerance: Patient tolerated treatment well Patient left: in chair;with call bell in reach;with family/visitor present General Behavior During Session: [facility] for tasks performed Cognition: Impaired, at baseline  Jenny Brady 04/05/2011, 1:28 PM

## 2011-04-06 MED ORDER — CLONAZEPAM 0.5 MG PO TABS: 0.5000 mg | ORAL_TABLET | Freq: Two times a day (BID) | ORAL | Status: AC | PRN

## 2011-04-06 MED ORDER — RISPERIDONE 1 MG PO TABS
1.0000 mg | ORAL_TABLET | Freq: Two times a day (BID) | ORAL | Status: AC
Start: 2011-04-06 — End: 2011-05-06

## 2011-04-06 MED ORDER — FUROSEMIDE 40 MG PO TABS: 40.0000 mg | ORAL_TABLET | Freq: Every day | ORAL | Status: DC

## 2011-04-06 MED ORDER — ENALAPRIL MALEATE 2.5 MG PO TABS: 2.5000 mg | ORAL_TABLET | Freq: Every day | ORAL | Status: DC

## 2011-04-06 MED ORDER — CARVEDILOL 6.25 MG PO TABS: 6.2500 mg | ORAL_TABLET | Freq: Two times a day (BID) | ORAL | Status: DC

## 2011-04-06 MED ORDER — MEMANTINE HCL 5 MG PO TABS: 5.0000 mg | ORAL_TABLET | Freq: Two times a day (BID) | ORAL | Status: DC

## 2011-04-06 MED ORDER — POTASSIUM CHLORIDE CRYS ER 20 MEQ PO TBCR: 20.0000 meq | EXTENDED_RELEASE_TABLET | Freq: Two times a day (BID) | ORAL | Status: DC

## 2011-04-06 NOTE — Progress Notes (Signed)
Was informed by MD earlier this day  that patient will be discharge home today, MD spoke with the son. D/c papers prepared. Attempted to call the son 3x, (155pm,205pm,338pm) [phone] but no call back. Patient is calm and quiet the whole shift.

## 2011-04-06 NOTE — Discharge Summary (Signed)
DISCHARGE SUMMARY  Jenny Brady  MR#: [id number]  DOB:Sep 11, 1925  Date of Admission: 03/26/2011 Date of Discharge: 04/06/2011  Attending Physician:Soley Harriss  Patient's OZH:[id number], Jenny Ammons, MD, MD  CONSULTS:  1. Dr. Rollene Rotunda, Cardiology  2. Mickeal Skinner, Psychiatry   Discharge Diagnoses: Present on Admission:  .CAD (?stent) cath 90's Jenny Brady .GERD (gastroesophageal reflux disease) .Hyperthyroidism-s/p RAI admin .DEMENTIA-mod-severe .Systolic CHF, acute on chronic .Syncope .Normocytic anemia .CKD (chronic kidney disease) stage 3, GFR 30-59 ml/min    Current Discharge Medication List    START taking these medications   Details  carvedilol (COREG) 6.25 MG tablet Take 1 tablet (6.25 mg total) by mouth 2 (two) times daily with a meal. Qty: 30 tablet, Refills: 0    clonazePAM (KLONOPIN) 0.5 MG tablet Take 1 tablet (0.5 mg total) by mouth 2 (two) times daily as needed (severe anxiety). Qty: 30 tablet, Refills: 0    enalapril (VASOTEC) 2.5 MG tablet Take 1 tablet (2.5 mg total) by mouth daily. Qty: 30 tablet, Refills: 0    furosemide (LASIX) 40 MG tablet Take 1 tablet (40 mg total) by mouth daily. Qty: 30 tablet, Refills: 0    memantine (NAMENDA) 5 MG tablet Take 1 tablet (5 mg total) by mouth 2 (two) times daily. Qty: 30 tablet, Refills: 0    potassium chloride SA (K-DUR,KLOR-CON) 20 MEQ tablet Take 1 tablet (20 mEq total) by mouth 2 (two) times daily. Qty: 60 tablet, Refills: 0    risperiDONE (RISPERDAL) 1 MG tablet Take 1 tablet (1 mg total) by mouth 2 (two) times daily. Qty: 60 tablet, Refills: 0      CONTINUE these medications which have NOT CHANGED   Details  aspirin EC 81 MG tablet Take 81 mg by mouth daily.      cholecalciferol (VITAMIN D) 1000 UNITS tablet Take 1,000 Units by mouth daily.      Cyanocobalamin (VITAMIN B-12 PO) Take 1 tablet by mouth daily.      levothyroxine (SYNTHROID, LEVOTHROID) 112 MCG tablet Take 56 mcg by  mouth daily.     meclizine (ANTIVERT) 25 MG tablet Take 25 mg by mouth 3 (three) times daily as needed. For dizziness.     ranitidine (ZANTAC) 150 MG tablet Take 150 mg by mouth daily as needed. For heartburn.      STOP taking these medications     ALPRAZolam (XANAX) 0.5 MG tablet        Brief Admission History: 77 y/o CF admitted with vague symptoms, but found to have EKG changes with possible NSTEMI vs LVH and repolarization changes on EKG. IV heparin started in ED but d/c'd after cardiac markers were cycled and found to be negative. V/Q scan low probability for pulmonary embolism. Responded well to IV lasix. Echo was done and showed severe wall motion anomaly, not likely candidate for operative or invasive therapy given her comorbidities. She appears to have advanced dementia and has remained agitated, and does have hoarding behavior and some confusion per her son, Jenny Brady. The plans at this point are for medical management. A PT/OT evaluation was done to determine her disposition, and at this point, 24-hour supervision or skilled nursing home placement is recommended.     Hospital Course: Systolic CHF, acute on chronic  The patient was admitted and a diagnostic evaluation undertaken. An initial chest radiograph showed pulmonary edema and her pro BNP levels were elevated. Cardiology was subsequently consult and and a two-dimensional echocardiogram was obtained which showed severe depression of LV function. She  was put on diuretic therapy initially with subsequent improvement in her symptoms. An ACE inhibitor was added on 03/28/2011. Her Lasix dose was reduced 03/29/11. Cardiology indicates that there are no further plans to perform invasive studies given her comorbidities and that she should remain medically optimized. Her pro BNP has slowly come down, and she has remained clinically stable.  Active Problems:  Syncope  The patient's initial presenting complaint was one of syncope. She was not  orthostatic. A VQ scan was done which showed low probability for pulmonary embolism. A PT/OT evaluation indicates that the patient should have 24-hour supervision in the home or be placed in a skilled nursing home facility. Both the patient and the patient's son agreed to this plan of care. PT wanted to go home with Home Physical Therapy.   DEMENTIA-mod-severe  The patient has required Haldol for agitation and has exhibited paranoia and sundowning type behavior. I spoke with patient's son about initiating therapy for symptom control with Risperdal and starting her on Namenda to help decrease the rate of progression of her dementia and we started these medications 03/30/11. I spoke with her son about obtaining a psychiatry consultation 03/31/11, and he is agreeable. The consultation was done by Dr. Ferol Luz on 04/01/11 and her recommendations were to give BID low-dose Risperdal for agitation control and to continue Namenda. She also recommended discontinuation of Ativan and Xanax and to use when necessary Klonopin for breakthrough anxiety.  CP-Pe vs ACS  No evidence of a pulmonary embolism or acute coronary syndrome based on VQ scan and cardiac markers. Patient's chest pain was vague and was likely due to other causes including decompensated heart failure.  CAD (?stent) cath 90's Jenny Brady  Patient had negative cardiac markers. No further invasive testing as recommended by cardiology. Continue aspirin and beta blocker therapy. Patient's lipids are well-controlled.  Hyperthyroidism-s/p RAI admin  The patient remains on Synthroid for thyroid hormone replacement therapy. Patient's TSH was checked and found to be normal.  GERD (gastroesophageal reflux disease)  The patient has been maintained on Pepcid for symptom control while in the hospital.  Normocytic anemia  Likely secondary to chronic kidney disease.  Stage III chronic kidney disease  The patient's renal function is currently stable.  Hypokalemia    Secondary to diuretic therapy. Patient's potassium was replaced.     Day of Discharge BP 108/72  Pulse 100  Temp(Src) 97.2 F (36.2 C) (Oral)  Resp 20  Ht 5\' 6"  (1.676 m)  Wt 76.5 kg (168 lb 10.4 oz)  BMI 27.22 kg/m2  SpO2 93%  Physical Exam:  Gen: No acute distress.  Cardiovascular: Heart sounds regular with no murmurs, rubs, or gallops.  Respiratory: Lungs clear to auscultation bilaterally.  Gastrointestinal: Abdomen soft, nontender, nondistended with normal active bowel sounds.  Extremities: No clubbing, edema, or cyanosis.   No results found for this or any previous visit (from the past 24 hour(s)).  Disposition: Discharge HOme with Home Health PT.   Follow-up Appts: Discharge Orders    Future Orders Please Complete By Expires   Diet - low sodium heart healthy      Increase activity slowly      Discharge instructions      Comments:   Follow up with PCP in one week.      Follow-up with PCP IN ONE weeks.     SignedKathlen Mody 04/06/2011, 12:22 PM

## 2011-04-06 NOTE — Progress Notes (Signed)
Patient's safety sitter discontinued, no longer appropriate for patient's behavior. Patient is calm and quiet at this time.

## 2011-04-06 NOTE — Progress Notes (Signed)
CARE MANAGEMENT NOTE 04/06/2011  Patient:  Jenny, Brady   Account Number:  [id number]  Date Initiated:  03/27/2011  Documentation initiated by:  Jiles Crocker  Subjective/Objective Assessment:   ADMITTED WITH SYNCOPE     Action/Plan:   PCP IS DR Su Hilt;   Anticipated DC Date:  04/01/2011   Anticipated DC Plan:  HOME W HOME HEALTH SERVICES  In-house referral  NA      DC Planning Services  CM consult      PAC Choice  NA   Choice offered to / List presented to:  NA   DME arranged  NA      DME agency  NA     HH arranged  HH-2 PT      [facility] agency  [facility]   Status of service:  In process, will continue to follow Medicare Important Message given?   (If response is "NO", the following Medicare IM given date fields will be blank) Date Medicare IM given:   Date Additional Medicare IM given:    Discharge Disposition:  HOME W HOME HEALTH SERVICES  Per UR Regulation:  Reviewed for med. necessity/level of care/duration of stay  Comments:  [id number]/Phong Isenberg,RN,BSN,CCM spoke to [facility] will be lab eot provide physical therapy visits to the home.  Information faxed to [facility].  03/27/2011 - Sofie Rower RN, BSN, MHA

## 2011-04-06 NOTE — Progress Notes (Signed)
Occupational Therapy Note Spoke with patient's nurse who requests OT let patient sleep currently as she has been agitated this am. States patient may discharge home this afternoon. Informed nursing that therapy recommending 24/7 care for safety at discharge. Per nsg, social worker supposed to get in touch with patient's son today regarding discharge plans. Will try back this afternoon as able. Judithann Sauger OTR/L [phone] 04/06/2011

## 2011-04-06 NOTE — Progress Notes (Signed)
Spoke with son regarding pt discharge. Pt son that he would not be able to arrive  to hospital until 8 or 9pm. MD paged. Hamed Debella A

## 2011-07-11 ENCOUNTER — Ambulatory Visit
Admission: RE | Admit: 2011-07-11 | Discharge: 2011-07-11 | Disposition: A | Discharge status: Home / Self Care | Payer: Medicare Other | Source: Ambulatory Visit | Attending: Internal Medicine | Admitting: Internal Medicine

## 2011-07-11 ENCOUNTER — Other Ambulatory Visit: Payer: Self-pay | Admitting: Internal Medicine

## 2011-07-11 DIAGNOSIS — I509 Heart failure, unspecified: Secondary | ICD-10-CM

## 2011-07-11 DIAGNOSIS — R609 Edema, unspecified: Secondary | ICD-10-CM

## 2012-07-09 DIAGNOSIS — I639 Cerebral infarction, unspecified: Secondary | ICD-10-CM

## 2012-07-09 DIAGNOSIS — I69391 Dysphagia following cerebral infarction: Secondary | ICD-10-CM

## 2012-07-09 DIAGNOSIS — R4701 Aphasia: Secondary | ICD-10-CM

## 2012-07-09 HISTORY — DX: Aphasia: R47.01

## 2012-07-09 HISTORY — DX: Dysphagia following cerebral infarction: I69.391

## 2012-07-09 HISTORY — DX: Cerebral infarction, unspecified: I63.9

## 2012-07-21 ENCOUNTER — Inpatient Hospital Stay (HOSPITAL_COMMUNITY)
Admission: EM | Admit: 2012-07-21 | Discharge: 2012-08-13 | DRG: 064 | Disposition: A | Discharge status: Skilled Nursing Facility | Payer: Medicare Other | Attending: Internal Medicine | Admitting: Internal Medicine

## 2012-07-21 ENCOUNTER — Emergency Department (HOSPITAL_COMMUNITY): Payer: Medicare Other

## 2012-07-21 ENCOUNTER — Encounter (HOSPITAL_COMMUNITY): Payer: Self-pay | Admitting: Emergency Medicine

## 2012-07-21 DIAGNOSIS — I5023 Acute on chronic systolic (congestive) heart failure: Secondary | ICD-10-CM

## 2012-07-21 DIAGNOSIS — I2789 Other specified pulmonary heart diseases: Secondary | ICD-10-CM | POA: Diagnosis present

## 2012-07-21 DIAGNOSIS — J189 Pneumonia, unspecified organism: Secondary | ICD-10-CM

## 2012-07-21 DIAGNOSIS — F03918 Unspecified dementia, unspecified severity, with other behavioral disturbance: Secondary | ICD-10-CM

## 2012-07-21 DIAGNOSIS — I079 Rheumatic tricuspid valve disease, unspecified: Secondary | ICD-10-CM | POA: Diagnosis present

## 2012-07-21 DIAGNOSIS — R29810 Facial weakness: Secondary | ICD-10-CM | POA: Diagnosis present

## 2012-07-21 DIAGNOSIS — R55 Syncope and collapse: Secondary | ICD-10-CM

## 2012-07-21 DIAGNOSIS — I5022 Chronic systolic (congestive) heart failure: Secondary | ICD-10-CM | POA: Diagnosis present

## 2012-07-21 DIAGNOSIS — R739 Hyperglycemia, unspecified: Secondary | ICD-10-CM

## 2012-07-21 DIAGNOSIS — I6330 Cerebral infarction due to thrombosis of unspecified cerebral artery: Secondary | ICD-10-CM

## 2012-07-21 DIAGNOSIS — G8190 Hemiplegia, unspecified affecting unspecified side: Secondary | ICD-10-CM | POA: Diagnosis present

## 2012-07-21 DIAGNOSIS — I2510 Atherosclerotic heart disease of native coronary artery without angina pectoris: Secondary | ICD-10-CM

## 2012-07-21 DIAGNOSIS — K219 Gastro-esophageal reflux disease without esophagitis: Secondary | ICD-10-CM

## 2012-07-21 DIAGNOSIS — F411 Generalized anxiety disorder: Secondary | ICD-10-CM

## 2012-07-21 DIAGNOSIS — F0391 Unspecified dementia with behavioral disturbance: Secondary | ICD-10-CM | POA: Diagnosis present

## 2012-07-21 DIAGNOSIS — R4701 Aphasia: Secondary | ICD-10-CM | POA: Diagnosis present

## 2012-07-21 DIAGNOSIS — I959 Hypotension, unspecified: Secondary | ICD-10-CM

## 2012-07-21 DIAGNOSIS — I428 Other cardiomyopathies: Secondary | ICD-10-CM | POA: Diagnosis present

## 2012-07-21 DIAGNOSIS — I63512 Cerebral infarction due to unspecified occlusion or stenosis of left middle cerebral artery: Secondary | ICD-10-CM

## 2012-07-21 DIAGNOSIS — R1310 Dysphagia, unspecified: Secondary | ICD-10-CM

## 2012-07-21 DIAGNOSIS — I6340 Cerebral infarction due to embolism of unspecified cerebral artery: Principal | ICD-10-CM | POA: Diagnosis present

## 2012-07-21 DIAGNOSIS — Z8673 Personal history of transient ischemic attack (TIA), and cerebral infarction without residual deficits: Secondary | ICD-10-CM | POA: Diagnosis present

## 2012-07-21 DIAGNOSIS — N183 Chronic kidney disease, stage 3 unspecified: Secondary | ICD-10-CM

## 2012-07-21 DIAGNOSIS — F068 Other specified mental disorders due to known physiological condition: Secondary | ICD-10-CM | POA: Diagnosis present

## 2012-07-21 DIAGNOSIS — I639 Cerebral infarction, unspecified: Secondary | ICD-10-CM

## 2012-07-21 DIAGNOSIS — R413 Other amnesia: Secondary | ICD-10-CM

## 2012-07-21 DIAGNOSIS — I509 Heart failure, unspecified: Secondary | ICD-10-CM

## 2012-07-21 DIAGNOSIS — R9389 Abnormal findings on diagnostic imaging of other specified body structures: Secondary | ICD-10-CM

## 2012-07-21 DIAGNOSIS — Z888 Allergy status to other drugs, medicaments and biological substances status: Secondary | ICD-10-CM

## 2012-07-21 DIAGNOSIS — R0600 Dyspnea, unspecified: Secondary | ICD-10-CM | POA: Insufficient documentation

## 2012-07-21 DIAGNOSIS — N3090 Cystitis, unspecified without hematuria: Secondary | ICD-10-CM

## 2012-07-21 DIAGNOSIS — Z602 Problems related to living alone: Secondary | ICD-10-CM

## 2012-07-21 DIAGNOSIS — R42 Dizziness and giddiness: Secondary | ICD-10-CM

## 2012-07-21 DIAGNOSIS — G4733 Obstructive sleep apnea (adult) (pediatric): Secondary | ICD-10-CM | POA: Diagnosis present

## 2012-07-21 DIAGNOSIS — Z7901 Long term (current) use of anticoagulants: Secondary | ICD-10-CM

## 2012-07-21 DIAGNOSIS — Z91041 Radiographic dye allergy status: Secondary | ICD-10-CM

## 2012-07-21 DIAGNOSIS — E039 Hypothyroidism, unspecified: Secondary | ICD-10-CM | POA: Diagnosis present

## 2012-07-21 DIAGNOSIS — R269 Unspecified abnormalities of gait and mobility: Secondary | ICD-10-CM

## 2012-07-21 DIAGNOSIS — Z79899 Other long term (current) drug therapy: Secondary | ICD-10-CM

## 2012-07-21 DIAGNOSIS — I5020 Unspecified systolic (congestive) heart failure: Secondary | ICD-10-CM

## 2012-07-21 DIAGNOSIS — I447 Left bundle-branch block, unspecified: Secondary | ICD-10-CM | POA: Diagnosis present

## 2012-07-21 DIAGNOSIS — E0590 Thyrotoxicosis, unspecified without thyrotoxic crisis or storm: Secondary | ICD-10-CM | POA: Diagnosis present

## 2012-07-21 DIAGNOSIS — E876 Hypokalemia: Secondary | ICD-10-CM

## 2012-07-21 DIAGNOSIS — K2980 Duodenitis without bleeding: Secondary | ICD-10-CM

## 2012-07-21 DIAGNOSIS — N390 Urinary tract infection, site not specified: Secondary | ICD-10-CM

## 2012-07-21 DIAGNOSIS — D649 Anemia, unspecified: Secondary | ICD-10-CM

## 2012-07-21 LAB — CBC WITH DIFFERENTIAL/PLATELET
Eosinophils Absolute: 0.1 10*3/uL (ref 0.0–0.7)
Eosinophils Relative: 2 % (ref 0–5)
HCT: 35.2 % — ABNORMAL LOW (ref 36.0–46.0)
Lymphocytes Relative: 23 % (ref 12–46)
MCH: 23.6 pg — ABNORMAL LOW (ref 26.0–34.0)
MCHC: 30.4 g/dL (ref 30.0–36.0)
MCV: 77.7 fL — ABNORMAL LOW (ref 78.0–100.0)
Monocytes Absolute: 0.4 10*3/uL (ref 0.1–1.0)
Platelets: 308 10*3/uL (ref 150–400)
RDW: 15.7 % — ABNORMAL HIGH (ref 11.5–15.5)

## 2012-07-21 LAB — HEPATIC FUNCTION PANEL
AST: 16 U/L (ref 0–37)
Albumin: 2.7 g/dL — ABNORMAL LOW (ref 3.5–5.2)
Alkaline Phosphatase: 107 U/L (ref 39–117)
Total Bilirubin: 0.6 mg/dL (ref 0.3–1.2)

## 2012-07-21 LAB — POCT I-STAT, CHEM 8
BUN: 16 mg/dL (ref 6–23)
Chloride: 101 mEq/L (ref 96–112)
Creatinine, Ser: 0.9 mg/dL (ref 0.50–1.10)
Potassium: 5.1 mEq/L (ref 3.5–5.1)
Sodium: 138 mEq/L (ref 135–145)

## 2012-07-21 LAB — POCT I-STAT TROPONIN I: Troponin i, poc: 0.01 ng/mL (ref 0.00–0.08)

## 2012-07-21 LAB — URINALYSIS, ROUTINE W REFLEX MICROSCOPIC
Glucose, UA: NEGATIVE mg/dL
Ketones, ur: NEGATIVE mg/dL
pH: 7.5 (ref 5.0–8.0)

## 2012-07-21 LAB — URINE MICROSCOPIC-ADD ON

## 2012-07-21 MED ORDER — LEVOFLOXACIN IN D5W 500 MG/100ML IV SOLN
500.0000 mg | Freq: Once | INTRAVENOUS | Status: AC
  Administered 2012-07-21: 500 mg via INTRAVENOUS
  Filled 2012-07-21: qty 100

## 2012-07-21 MED ORDER — DEXAMETHASONE SODIUM PHOSPHATE 10 MG/ML IJ SOLN
10.0000 mg | Freq: Once | INTRAMUSCULAR | Status: AC
  Administered 2012-07-21: 10 mg via INTRAVENOUS
  Filled 2012-07-21: qty 1

## 2012-07-21 NOTE — Progress Notes (Signed)
[facility] hospitalist progress note. Chief complaint. Transfer note. History of present illness. This 79 year old female found to be poorly responsive at home. EMS was called and patient was evaluated at [facility] long emergency room. CT scan showed an acute nonhemorrhagic left MCA stroke. Neurology was consulted and recommended transfer to [facility] for treatment. She is not felt to be a candidate for TPA. The patient has now arrived at [facility] and I'm seeing the patient at bedside to ensure she remains clinically stable post transfer and her orders have transferred appropriately. I find the patient nonverbal and apparently unable to follow instructions. Vital signs. Temperature 98.4, pulse 85, respiration 22, blood pressure 124/72. O2 sats 98% on low-flow nasal cannula oxygen. General appearance. Says a well-developed/obese elderly female who is alert but nonverbal and does not follow commands. Cardiac. Rate and rhythm regular. Lungs. Reduced in the bases bilaterally. Very mild expiratory wheezing. Mild increase work of breathing with stable O2 sats. Abdomen. Soft and obese with positive bowel sounds. No pain with palpation. Neurologic. The patient is nonverbal. Facial droop is present. She is noted to have dense right-sided hemiparesis. Impression/plan. Problem #1. Acute ischemic left MCA stroke. Patient for echocardiogram, carotid Dopplers, MRI/MRA. Problem #2. Systolic congestive heart failure. Mild fluid overload per chest x-ray. Manage with IV Lasix. Problem #3. Dyspnea. Patient has no conclusive evidence of infiltrates per chest x-ray. No fever or leukocytosis. Patient empirically placed on Levaquin antibiotic therapy with followup chest x-ray pending. Per bedside evaluation the patient appears clinically stable post transfer and all orders appear to have transferred appropriately.

## 2012-07-21 NOTE — ED Notes (Signed)
Critical value shown to Dr Valentina Gu

## 2012-07-21 NOTE — ED Notes (Signed)
WUJ:[id number]<JY> Expected date:<BR> Expected time:<BR> Means of arrival:<BR> Comments:<BR> SOB

## 2012-07-21 NOTE — ED Provider Notes (Addendum)
History     CSN: [id number]  Arrival date & time 07/21/12  1607   First MD Initiated Contact with Patient 07/21/12 1625      Chief Complaint  Patient presents with  . Shortness of Breath    (Consider location/radiation/quality/duration/timing/severity/associated sxs/prior treatment) HPI Comments: LEVEL 5 CAVEAT FOR ALTERED MENTAL STATUS.  Pt comes in with cc of dib. We called [phone] - the phone number on record, and the phone is disconnected. Pt brought to the ER by EMS with cc of AMS/DIB. Per EMS, son called EMS as patient was unresponsive, and he was not helpful himself.   Patient is a 79 y.o. female presenting with shortness of breath. The history is provided by medical records. The history is limited by the condition of the patient.  Shortness of Breath   Past Medical History  Diagnosis Date  . Hyperthyroidism     had RAI-is on thyroid replacement, happened maybe this summer  . Coronary artery disease     Had a catheterization per son by Dr. [name] in 1991 no reports in  [place]  . GERD (gastroesophageal reflux disease)     Past Surgical History  Procedure Laterality Date  . None      Family History  Problem Relation Age of Onset  . Acne Mother     died giving birth to 8th kid  . Dementia Father     died 70  . Acne Brother     died 2-3 yrs ago  . Cervical cancer Sister     History  Substance Use Topics  . Smoking status: Never Smoker   . Smokeless tobacco: Never Used  . Alcohol Use: No    OB History   Grav Para Term Preterm Abortions TAB SAB Ect Mult Living                  Review of Systems  Unable to perform ROS: Mental status change    Allergies  Hyoscyamine and Ivp dye  Home Medications   Current Outpatient Rx  Name  Route  Sig  Dispense  Refill  . aspirin EC 81 MG tablet   Oral   Take 81 mg by mouth daily.           Marland Kitchen EXPIRED: carvedilol (COREG) 6.25 MG tablet   Oral   Take 1 tablet (6.25 mg total) by mouth 2  (two) times daily with a meal.   30 tablet   0   . cholecalciferol (VITAMIN D) 1000 UNITS tablet   Oral   Take 1,000 Units by mouth daily.           . Cyanocobalamin (VITAMIN B-12 PO)   Oral   Take 1 tablet by mouth daily.           Marland Kitchen EXPIRED: enalapril (VASOTEC) 2.5 MG tablet   Oral   Take 1 tablet (2.5 mg total) by mouth daily.   30 tablet   0   . EXPIRED: furosemide (LASIX) 40 MG tablet   Oral   Take 1 tablet (40 mg total) by mouth daily.   30 tablet   0   . levothyroxine (SYNTHROID, LEVOTHROID) 112 MCG tablet   Oral   Take 56 mcg by mouth daily.          . meclizine (ANTIVERT) 25 MG tablet   Oral   Take 25 mg by mouth 3 (three) times daily as needed. For dizziness.          Marland Kitchen  EXPIRED: memantine (NAMENDA) 5 MG tablet   Oral   Take 1 tablet (5 mg total) by mouth 2 (two) times daily.   30 tablet   0   . EXPIRED: potassium chloride SA (K-DUR,KLOR-CON) 20 MEQ tablet   Oral   Take 1 tablet (20 mEq total) by mouth 2 (two) times daily.   60 tablet   0   . ranitidine (ZANTAC) 150 MG tablet   Oral   Take 150 mg by mouth daily as needed. For heartburn.           BP 132/80  Pulse 83  Resp 30  SpO2 100%  Physical Exam  Nursing note and vitals reviewed. Constitutional: She appears well-developed.  HENT:  Head: Normocephalic and atraumatic.  Right sided facial droop, + gag reflex  Eyes: Conjunctivae are normal. Pupils are equal, round, and reactive to light.  Cardiovascular: Normal rate.   Murmur heard. Pulmonary/Chest: Effort normal. She has no wheezes.  RR - 28, rales to mid thorax  Abdominal: Soft.  Neurological:  GCS - 4/1/4 = 9, moving all 4s, but right side has decreased strength compared to left.  Skin: Skin is warm. Rash noted.    ED Course  Procedures (including critical care time)  Labs Reviewed  BLOOD GAS, ARTERIAL - Abnormal; Notable for the following:    pH, Arterial 7.507 (*)    pCO2 arterial 32.8 (*)    pO2, Arterial 67.8  (*)    Bicarbonate 25.8 (*)    Acid-Base Excess 3.3 (*)    All other components within normal limits  URINE CULTURE  CBC WITH DIFFERENTIAL  HEPATIC FUNCTION PANEL  PRO B NATRIURETIC PEPTIDE  URINALYSIS, ROUTINE W REFLEX MICROSCOPIC   No results found.   No diagnosis found.    MDM  Pt comes in with cc of DIB.  DDx: Sepsis syndrome ACS syndrome DKA ICH Stroke CHF exacerbation COPD exacerbation Infection - pneumonia/UTI/Cellulitis PE Dehydration Electrolyte abnormality Tox syndrome  Pt comes in with cc of dib, unresponsive, Hx of advanced CHF, CAD, Dementia. Unable to get any hx from the patient, and unable to reach the son. Exam shows mild tachypnea, right sided facial droop, GCS of 9, with positive gag, and bilateral rales.  Unsure what the cause of her sx are at this time, and the ddx is broad as above. Initial impression is that patient had a stroke or ICH. Will initiate broad workup and reassess.   Derwood Kaplan, MD 07/21/12 1711   Date: 07/21/2012  Rate: 80  Rhythm: normal sinus rhythm  QRS Axis: left  Intervals: normal  ST/T Wave abnormalities: nonspecific ST/T changes  Conduction Disutrbances:left bundle branch block  Narrative Interpretation:   Old EKG Reviewed: unchanged  6:05 PM Pt's CT shows Left MCa infarct, with some edema, and it is acute. Unable to reach family still. Dr. Thad Ranger, Neurology consulted - and she is requesting transfer to [facility] as patient has acute stroke.     Derwood Kaplan, MD 07/21/12 1806  6:45 PM Pt slightly more alert. Still has a weak gag. Questionable PNA, levofloxacin given. Likely mild CHF as well, deferring lasix for now, as we dont want to impair cerebral perfusion. Decadron given, as there is some mild edema post stroke.   Derwood Kaplan, MD 07/21/12 [phone]

## 2012-07-21 NOTE — H&P (Signed)
PCP:  [name], MD  Cardiology [facility], [address] Dr. Rollene Rotunda, Cardiology Psychiatry Mickeal Skinner,    Chief Complaint:  Patient was found poorly responsive  HPI: Jenny Brady is a 79 y.o. female   has a past medical history of Hyperthyroidism; Coronary artery disease; and GERD (gastroesophageal reflux disease).   Presented with  Patient is unable to provide her own history but through conversations with ER nurses as well as ER physician patient was found to have initiate a poorly responsive in some respiratory distress and wheezing. Her son was at the site that was intoxicated and unable to provide any history. EMS stated that the house conditions were poor with evidence of hoarding. It is unclear if the son resides with the patient. She was brought to [facility] long on emergency department and was noted to have pronounced right facial droop and right hemiparesis. A stat CT scan was ordered that did show acute nonhemorrhagic left MCA stroke. Neurology has been consulted and recommended this point transferred to [facility]. Patient is not a candidate for TPA given unclear duration her symptoms. Patient is aphasic.   hospitalist will admit.  The workup was significant for UA suggestive of mild UTI and chest x-ray suggestive of mild pulmonary edema and in atelectasis versus infiltrate. Patient is currently in no respiratory distress and have not showed any evidence of coughing. She has been afebrile while in emergency department.  ER M.D. was unable to reach  the family but the nursing staff states that they saw her son in the waiting room heavily intoxicated and currently he has left.  Review of Systems:  Past Medical History: Past Medical History  Diagnosis Date  . Hyperthyroidism     had RAI-is on thyroid replacement, happened maybe this summer  . Coronary artery disease     Had a catheterization per son by Dr. [name] in 1991 no reports in  [place]  . GERD  (gastroesophageal reflux disease)    Past Surgical History  Procedure Laterality Date  . None       Medications: Prior to Admission medications   Medication Sig Start Date End Date Taking? Authorizing Provider  carvedilol (COREG) 6.25 MG tablet Take 1 tablet (6.25 mg total) by mouth 2 (two) times daily with a meal. 04/06/11 07/21/12 Yes Kathlen Mody, MD  furosemide (LASIX) 40 MG tablet Take 1 tablet (40 mg total) by mouth daily. 04/06/11 07/21/12 Yes Kathlen Mody, MD  levothyroxine (SYNTHROID, LEVOTHROID) 112 MCG tablet Take 56 mcg by mouth daily.    Yes Historical Provider, MD  memantine (NAMENDA) 5 MG tablet Take 1 tablet (5 mg total) by mouth 2 (two) times daily. 04/06/11 07/21/12 Yes Kathlen Mody, MD  aspirin EC 81 MG tablet Take 81 mg by mouth daily.      Historical Provider, MD  cholecalciferol (VITAMIN D) 1000 UNITS tablet Take 1,000 Units by mouth daily.      Historical Provider, MD  Cyanocobalamin (VITAMIN B-12 PO) Take 1 tablet by mouth daily.      Historical Provider, MD  enalapril (VASOTEC) 2.5 MG tablet Take 1 tablet (2.5 mg total) by mouth daily. 04/06/11 04/05/12  Kathlen Mody, MD  meclizine (ANTIVERT) 25 MG tablet Take 25 mg by mouth 3 (three) times daily as needed. For dizziness.     Historical Provider, MD  potassium chloride SA (K-DUR,KLOR-CON) 20 MEQ tablet Take 1 tablet (20 mEq total) by mouth 2 (two) times daily. 04/06/11 04/05/12  Kathlen Mody, MD  ranitidine (  ZANTAC) 150 MG tablet Take 150 mg by mouth daily as needed. For heartburn.    Historical Provider, MD    Allergies:   Allergies  Allergen Reactions  . Hyoscyamine   . Ivp Dye (Iodinated Diagnostic Agents)     Social History:  History is obtained through review of the chart    reports that she has never smoked. She has never used smokeless tobacco. She reports that she does not drink alcohol or use illicit drugs.   Family History: family history includes Acne in her brother and mother; Cervical cancer in  her sister; and Dementia in her father.    Physical Exam: Patient Vitals for the past 24 hrs:  BP Pulse Resp SpO2  07/21/12 1915 143/79 mmHg - 26 -  07/21/12 1618 132/80 mmHg 83 30 100 %  07/21/12 1612 - - - 91 %    1. General:  in [place] distress, disheveled appearing 2. Psychological: Alert  but aphasic 3. Head/ENT:   Moist  Mucous Membranes                          Head Non traumatic, neck supple                          Normal  Dentition 4. SKIN: normal  Skin turgor,  Skin Dry and intact no rash   5. Heart: Regular rate and rhythm systolic  Murmur present ,  No Rub or gallop 6. Lungs: no wheezes  occasional  crackles   7. Abdomen: Soft, non-tender, Non distended obese  8. Lower extremities: no clubbing, cyanosis, or edema obese but no edema noted  9. Neurologically  patient does not follow commands but spontaneously moves her left side she shows some movement of her right side to painful stimuli but otherwise is flaccid. Right facial droop noted gaze deviated to the left. Patient is aphasic. 10. MSK: Normal range of motion  body mass index is unknown because there is no weight on file.   Labs on Admission:   Recent Labs  07/21/12 1732  NA 138  K 5.1  CL 101  GLUCOSE 184*  BUN 16  CREATININE 0.90    Recent Labs  07/21/12 1635  AST 16  ALT 12  ALKPHOS 107  BILITOT 0.6  PROT 6.4  ALBUMIN 2.7*   No results found for this basename: LIPASE, AMYLASE,  in the last 72 hours  Recent Labs  07/21/12 1635 07/21/12 1732  WBC 6.3  --   NEUTROABS 4.4  --   HGB 10.7* 12.2  HCT 35.2* 36.0  MCV 77.7*  --   PLT 308  --    No results found for this basename: CKTOTAL, CKMB, CKMBINDEX, TROPONINI,  in the last 72 hours No results found for this basename: TSH, T4TOTAL, FREET3, T3FREE, THYROIDAB,  in the last 72 hours No results found for this basename: VITAMINB12, FOLATE, FERRITIN, TIBC, IRON, RETICCTPCT,  in the last 72 hours Lab Results  Component Value Date   HGBA1C  6.4* 04/27/2006    The CrCl is unknown because both a height and weight (above a minimum accepted value) are required for this calculation. ABG    Component Value Date/Time   PHART 7.507* 07/21/2012 1644   HCO3 25.8* 07/21/2012 1644   TCO2 29 07/21/2012 1732   O2SAT 93.5 07/21/2012 1644     Lab Results  Component Value Date   DDIMER 2.23*  03/26/2011     Other results:  I have pearsonaly reviewed this: ECG REPORT  Rate: 80  Rhythm: Left bundle branch block  ST&T Change: n/a  UA evidence of mild UTI BNP 9584.0 (H)  Cultures: No results found for this basename: sdes, specrequest, cult, reptstatus       Radiological Exams on Admission: Dg Chest 1 View  07/21/2012  *RADIOLOGY REPORT*  Clinical Data: Shortness of breath and wheezing.  Altered mental status.  CHEST - 1 VIEW  Comparison: PA and lateral chest 07/11/2011.  Findings: There is cardiomegaly and pulmonary edema.  Small left effusion and basilar airspace disease are also identified.  IMPRESSION:  1.  Cardiomegaly and pulmonary edema. 2.  Small left pleural effusion and basilar airspace disease which could be due to atelectasis or pneumonia.   Original Report Authenticated By: Holley Dexter, M.D.    Ct Head Wo Contrast  07/21/2012  *RADIOLOGY REPORT*  Clinical Data: Altered mental status.  The patient is unresponsive.  CT HEAD WITHOUT CONTRAST  Technique:  Contiguous axial images were obtained from the base of the skull through the vertex without contrast.  Comparison: 02/09/2008  Findings: The patient has an acute large non hemorrhagic left middle cerebral artery distribution infarct with slight edema.  No midline shift at this time. There is a hyper dense branch of the left middle cerebral artery seen on image number 11 of series 2, consistent with acute thrombosis.  There is an old left occipital infarct, new since 02/09/2008.  Diffuse cerebral cortical atrophy, most prominent in the temporal lobes.  No significant osseous  abnormality.  IMPRESSION:  1.  Large nonhemorrhagic left middle cerebral artery infarct with early edema without midline shift. 2.  Old left occipital infarct. 3.  Atrophy, most prominent in the temporal lobes.   Original Report Authenticated By: Francene Boyers, M.D.     Chart has been reviewed  Assessment/Plan  79 year old female with history of CHF coronary disease and dementia presents with initial shortness of breath and right hemiparesis and evidence of acute left MCA CVA on her CT scan.  Present on Admission:  . Acute ischemic left MCA stroke - transfer patient to [facility], neurology is aware will order echo gram, Dopplers, MRI MRA. Initiate full dose aspirin. PT OT and speech pathology evaluation father risk stratify with lipid panel hemoglobin A1c also obtain TSH monitor blood sugars the patient has no known history of diabetes.  . DEMENTIA-mod-severe - unable to clarify with her current baseline is patient currently unable to pass swallow evaluation,  we'll hold Namenda  . Coronary artery disease - initially EMS reported to show shortness of breath will cycle cardiac markers EKG showed a bundle branch block. Which is unchanged from 2012.  Marland Kitchen Systolic CHF - patient has known history of CHF with EF of 25-30%. She has a mild fluid overload on chest x-ray unable to take by mouth Lasix at this point will continue with IV Lasix but try to avoid overdiuresis of hypertension as patient had acute CVA.   Marland Kitchen Dyspnea multifactorial likely combination of some fluid overload and aspiration could not be excluded this patient was poorly responsive when EMS first arrived. Chest x-ray not conclusive for any infiltrates is no evidence of fever or blood cell count elevation. For now cover Levaquin as it should cover both urine and lungs. Will repeat chest x-ray tomorrow to see if this could be further clarified.  . Abnormal CXR repeat chest x-ray tomorrow to further evaluate if there  is any evidence of infiltrate   . Hyperglycemia without ketosis. Patient have had slightly elevated blood sugars and not able to find any history of diabetes in her chart. Will watch blood sugars swallowing the patient and check hemoglobin A1c  . UTI (lower urinary tract infection) UA suggestive of a mild urinary tract infection this should be covered with Levaquin we'll await results of urine culture    Prophylaxis: SCD  Protonix  CODE STATUS:  For now will assume patient to be full code until able to contact the family  Other plan as per orders.  I have spent a total of  60 min on this admission  Kele Barthelemy 07/21/2012, 7:38 PM

## 2012-07-21 NOTE — Consult Note (Signed)
Referring Physician: [facility]    Chief Complaint: stroke.  HPI:                                                                                                                                         Jenny Brady is an 79 y.o. female  with a past medical history significant for dementia, CAD, CHF, hyperthyroidism, transferred from [facility] for further management of a large left MCA distribution infarct. Patient is not able to provide information and thus all clinical data is gather from patient's medical record. She appaently lives by herself and today she was found by her son unresponsive with some respiratory distress and was taken to [facility] where she was found to be poorly responsive and unable to move the right side. An emergent CT brain revealed a large area of decreased attenuation in the left MCA distribution suggestive of an ischemic infarct. [facility]red [facility] for further managemen  Date last known well: uncertain  Time last known well: uncertain tPA Given: no, unknown time of onset.  Past Medical History  Diagnosis Date  . Hyperthyroidism     had RAI-is on thyroid replacement, happened maybe this summer  . Coronary artery disease     Had a catheterization per son by Dr. [name] in 1991 no reports in  [place]  . GERD (gastroesophageal reflux disease)     Past Surgical History  Procedure Laterality Date  . None      Family History  Problem Relation Age of Onset  . Acne Mother     died giving birth to 8th kid  . Dementia Father     died 6  . Acne Brother     died 2-3 yrs ago  . Cervical cancer Sister    Social History:  reports that she has never smoked. She has never used smokeless tobacco. She reports that she does not drink alcohol or use illicit drugs.  Allergies:  Allergies  Allergen Reactions  . Hyoscyamine   . Ivp Dye (Iodinated Diagnostic Agents)   . Septra (Sulfamethoxazole W-Trimethoprim)     Medications:                                                                                                                            I have reviewed the patient's current medications.  ROS:  Unable to obtain.  History obtained from chart review     Physical exam: pleasant, mild respiratory distress. Blood pressure 124/72, pulse 85, temperature 98.4 F (36.9 C), temperature source Axillary, resp. rate 22, SpO2 98.00%. Head: normocephalic. Neck: supple, no bruits, no JVD. Cardiac: no murmurs. Lungs: clear. Abdomen: soft, no tender, no mass. Extremities: bilateral edema.  Neurologic Examination:                                                                                                      Mental Status: Alert and awake but unable to follow commands. Cranial Nerves: II: Discs flat bilaterally; Visual fields grossly normal, pupils equal, round, reactive to light and accommodation III,IV, VI: ptosis not present, left gaze preference. V: facial light touch sensation normal bilaterally VII: right face weakness. VIII: hearing normal bilaterally IX,X: gag reflex present XI: bilateral shoulder shrug XII: midline tongue extension Motor: Dense right hemiparesis with decreased tone. Sensory: withdraws to pain. Deep Tendon Reflexes: 1+ and symmetric throughout Plantars: Right: upgoing   Left: downgoing Cerebellar: Unable to test. Gait: unable to test. CV: pulses palpable throughout      Results for orders placed during the hospital encounter of 07/21/12 (from the past 48 hour(s))  CBC WITH DIFFERENTIAL     Status: Abnormal   Collection Time    07/21/12  4:35 PM      Result Value Range   WBC 6.3  4.0 - 10.5 K/uL   RBC 4.53  3.87 - 5.11 MIL/uL   Hemoglobin 10.7 (*) 12.0 - 15.0 g/dL   HCT 16.1 (*) 09.6 - 04.5 %   MCV 77.7 (*) 78.0 - 100.0 fL   MCH 23.6 (*) 26.0 - 34.0 pg   MCHC 30.4  30.0 - 36.0 g/dL    RDW 40.9 (*) 81.1 - 15.5 %   Platelets 308  150 - 400 K/uL   Neutrophils Relative 69  43 - 77 %   Neutro Abs 4.4  1.7 - 7.7 K/uL   Lymphocytes Relative 23  12 - 46 %   Lymphs Abs 1.4  0.7 - 4.0 K/uL   Monocytes Relative 6  3 - 12 %   Monocytes Absolute 0.4  0.1 - 1.0 K/uL   Eosinophils Relative 2  0 - 5 %   Eosinophils Absolute 0.1  0.0 - 0.7 K/uL   Basophils Relative 0  0 - 1 %   Basophils Absolute 0.0  0.0 - 0.1 K/uL  HEPATIC FUNCTION PANEL     Status: Abnormal   Collection Time    07/21/12  4:35 PM      Result Value Range   Total Protein 6.4  6.0 - 8.3 g/dL   Albumin 2.7 (*) 3.5 - 5.2 g/dL   AST 16  0 - 37 U/L   ALT 12  0 - 35 U/L   Alkaline Phosphatase 107  39 - 117 U/L   Total Bilirubin 0.6  0.3 - 1.2 mg/dL   Bilirubin, Direct 0.1  0.0 - 0.3 mg/dL   Indirect Bilirubin 0.5  0.3 - 0.9 mg/dL  PRO B NATRIURETIC  PEPTIDE     Status: Abnormal   Collection Time    07/21/12  4:35 PM      Result Value Range   Pro B Natriuretic peptide (BNP) 9584.0 (*) 0 - 450 pg/mL  BLOOD GAS, ARTERIAL     Status: Abnormal   Collection Time    07/21/12  4:44 PM      Result Value Range   FIO2 0.21     pH, Arterial 7.507 (*) 7.350 - 7.450   pCO2 arterial 32.8 (*) 35.0 - 45.0 mmHg   pO2, Arterial 67.8 (*) 80.0 - 100.0 mmHg   Bicarbonate 25.8 (*) 20.0 - 24.0 mEq/L   TCO2 23.2  0 - 100 mmol/L   Acid-Base Excess 3.3 (*) 0.0 - 2.0 mmol/L   O2 Saturation 93.5     Patient temperature 98.6     Collection site REVIEWED BY     Drawn by [id number]     Sample type ARTERIAL DRAW    GLUCOSE, CAPILLARY     Status: Abnormal   Collection Time    07/21/12  5:24 PM      Result Value Range   Glucose-Capillary 157 (*) 70 - 99 mg/dL   Comment 1 Notify RN    POCT I-STAT TROPONIN I     Status: None   Collection Time    07/21/12  5:31 PM      Result Value Range   Troponin i, poc 0.01  0.00 - 0.08 ng/mL   Comment 3            Comment: Due to the release kinetics of cTnI,     a negative result within the first  hours     of the onset of symptoms does not rule out     myocardial infarction with certainty.     If myocardial infarction is still suspected,     repeat the test at appropriate intervals.  CG4 I-STAT (LACTIC ACID)     Status: Abnormal   Collection Time    07/21/12  5:32 PM      Result Value Range   Lactic Acid, Venous 2.69 (*) 0.5 - 2.2 mmol/L  POCT I-STAT, CHEM 8     Status: Abnormal   Collection Time    07/21/12  5:32 PM      Result Value Range   Sodium 138  135 - 145 mEq/L   Potassium 5.1  3.5 - 5.1 mEq/L   Chloride 101  96 - 112 mEq/L   BUN 16  6 - 23 mg/dL   Creatinine, Ser 0.98  0.50 - 1.10 mg/dL   Glucose, Bld 119 (*) 70 - 99 mg/dL   Calcium, Ion 1.47 (*) 1.13 - 1.30 mmol/L   TCO2 29  0 - 100 mmol/L   Hemoglobin 12.2  12.0 - 15.0 g/dL   HCT 82.9  56.2 - 13.0 %  URINALYSIS, ROUTINE W REFLEX MICROSCOPIC     Status: Abnormal   Collection Time    07/21/12  5:34 PM      Result Value Range   Color, Urine YELLOW  YELLOW   APPearance CLEAR  CLEAR   Specific Gravity, Urine 1.015  1.005 - 1.030   pH 7.5  5.0 - 8.0   Glucose, UA NEGATIVE  NEGATIVE mg/dL   Hgb urine dipstick NEGATIVE  NEGATIVE   Bilirubin Urine NEGATIVE  NEGATIVE   Ketones, ur NEGATIVE  NEGATIVE mg/dL   Protein, ur 30 (*) NEGATIVE mg/dL   Urobilinogen, UA 1.0  0.0 - 1.0 mg/dL   Nitrite NEGATIVE  NEGATIVE   Leukocytes, UA SMALL (*) NEGATIVE  URINE MICROSCOPIC-ADD ON     Status: None   Collection Time    07/21/12  5:34 PM      Result Value Range   Squamous Epithelial / LPF RARE  RARE   WBC, UA 7-10  <3 WBC/hpf   RBC / HPF 0-2  <3 RBC/hpf   Dg Chest 1 View  07/21/2012  *RADIOLOGY REPORT*  Clinical Data: Shortness of breath and wheezing.  Altered mental status.  CHEST - 1 VIEW  Comparison: PA and lateral chest 07/11/2011.  Findings: There is cardiomegaly and pulmonary edema.  Small left effusion and basilar airspace disease are also identified.  IMPRESSION:  1.  Cardiomegaly and pulmonary edema. 2.  Small  left pleural effusion and basilar airspace disease which could be due to atelectasis or pneumonia.   Original Report Authenticated By: Holley Dexter, M.D.    Ct Head Wo Contrast  07/21/2012  *RADIOLOGY REPORT*  Clinical Data: Altered mental status.  The patient is unresponsive.  CT HEAD WITHOUT CONTRAST  Technique:  Contiguous axial images were obtained from the base of the skull through the vertex without contrast.  Comparison: 02/09/2008  Findings: The patient has an acute large non hemorrhagic left middle cerebral artery distribution infarct with slight edema.  No midline shift at this time. There is a hyper dense branch of the left middle cerebral artery seen on image number 11 of series 2, consistent with acute thrombosis.  There is an old left occipital infarct, new since 02/09/2008.  Diffuse cerebral cortical atrophy, most prominent in the temporal lobes.  No significant osseous abnormality.  IMPRESSION:  1.  Large nonhemorrhagic left middle cerebral artery infarct with early edema without midline shift. 2.  Old left occipital infarct. 3.  Atrophy, most prominent in the temporal lobes.   Original Report Authenticated By: Francene Boyers, M.D.        Assessment: 79 y.o. female with large left MCA distribution infarct, most likely embolic. No a candidate for thrombolytics due to unknown time of onset of symptoms. Aspirin when able to take PO medICATIONS. Complete stroke work up and follow stroke non tpa protocol. Stroke team to resume care in the morning.  Stroke Risk Factors - age, CAD, CHF  Plan: 1. HgbA1c, fasting lipid panel 2. MRI, MRA  of the brain without contrast 3. PT consult, OT consult, Speech consult 4. Echocardiogram 5. Carotid dopplers 6. Prophylactic therapy-Antiplatelet med: Aspirin - dose 81 mg daily. 7. Risk factor modification 8. Telemetry monitoring 9. Frequent neuro checks  Wyatt Portela, MD [facility] [phone]  07/21/2012, 11:41 PM

## 2012-07-21 NOTE — ED Notes (Addendum)
EMS reports patient was unresponsive but alert on arrival.  Patient was in respiratory distress on scene.  No IV access on arrival.  Patient lives alone.  EMS not aware of LKN.

## 2012-07-21 NOTE — ED Notes (Signed)
Son called stating that his mother had on a cross necklace and a necklace with a jade pendant; pt did not have any necklaces on at time of arrival; pt does have a gold tone bracelet to the left wrist at time of transfer to [facility]; son states he saw the bracelet when he left but he did not take it since she was our patient; informed son she did not have on any necklaces he stated that she may or may not have put them on when she left home; son asked if we had video surveillance showing his mother's arrival to the hospital; informed son we did not; asked son did he want me to lock up bracelet at [facility] security or have bracelet locked up at [facility] where patient was currently in process of being transferred to; son states ok to lock up bracelet at [facility]; this information passed to JC from Charlo and states would pass to nurse at [facility]

## 2012-07-22 ENCOUNTER — Inpatient Hospital Stay (HOSPITAL_COMMUNITY): Payer: Medicare Other

## 2012-07-22 DIAGNOSIS — R079 Chest pain, unspecified: Secondary | ICD-10-CM

## 2012-07-22 DIAGNOSIS — I6330 Cerebral infarction due to thrombosis of unspecified cerebral artery: Secondary | ICD-10-CM

## 2012-07-22 DIAGNOSIS — E0590 Thyrotoxicosis, unspecified without thyrotoxic crisis or storm: Secondary | ICD-10-CM

## 2012-07-22 DIAGNOSIS — R0989 Other specified symptoms and signs involving the circulatory and respiratory systems: Secondary | ICD-10-CM

## 2012-07-22 DIAGNOSIS — F411 Generalized anxiety disorder: Secondary | ICD-10-CM

## 2012-07-22 DIAGNOSIS — R918 Other nonspecific abnormal finding of lung field: Secondary | ICD-10-CM

## 2012-07-22 DIAGNOSIS — I6350 Cerebral infarction due to unspecified occlusion or stenosis of unspecified cerebral artery: Secondary | ICD-10-CM

## 2012-07-22 DIAGNOSIS — I509 Heart failure, unspecified: Secondary | ICD-10-CM

## 2012-07-22 DIAGNOSIS — R7309 Other abnormal glucose: Secondary | ICD-10-CM

## 2012-07-22 DIAGNOSIS — I5023 Acute on chronic systolic (congestive) heart failure: Secondary | ICD-10-CM

## 2012-07-22 DIAGNOSIS — J189 Pneumonia, unspecified organism: Secondary | ICD-10-CM | POA: Diagnosis present

## 2012-07-22 DIAGNOSIS — I369 Nonrheumatic tricuspid valve disorder, unspecified: Secondary | ICD-10-CM

## 2012-07-22 DIAGNOSIS — N390 Urinary tract infection, site not specified: Secondary | ICD-10-CM

## 2012-07-22 DIAGNOSIS — K219 Gastro-esophageal reflux disease without esophagitis: Secondary | ICD-10-CM

## 2012-07-22 LAB — GLUCOSE, CAPILLARY: Glucose-Capillary: 166 mg/dL — ABNORMAL HIGH (ref 70–99)

## 2012-07-22 LAB — LIPID PANEL
HDL: 27 mg/dL — ABNORMAL LOW (ref >39)
LDL Cholesterol: 90 mg/dL (ref 0–99)
Total CHOL/HDL Ratio: 5 RATIO
Triglycerides: 93 mg/dL (ref <150)
VLDL: 19 mg/dL (ref 0–40)

## 2012-07-22 LAB — URINE CULTURE

## 2012-07-22 LAB — HEMOGLOBIN A1C
Hgb A1c MFr Bld: 7.3 % — ABNORMAL HIGH (ref <5.7)
Mean Plasma Glucose: 163 mg/dL — ABNORMAL HIGH (ref <117)

## 2012-07-22 LAB — BLOOD GAS, ARTERIAL
Acid-Base Excess: 3.3 mmol/L — ABNORMAL HIGH (ref 0.0–2.0)
Drawn by: 307971
pCO2 arterial: 32.8 mmHg — ABNORMAL LOW (ref 35.0–45.0)
pH, Arterial: 7.507 — ABNORMAL HIGH (ref 7.350–7.450)
pO2, Arterial: 67.8 mmHg — ABNORMAL LOW (ref 80.0–100.0)

## 2012-07-22 LAB — TROPONIN I: Troponin I: <0.3 ng/mL (ref <0.30)

## 2012-07-22 MED ORDER — LORAZEPAM 2 MG/ML IJ SOLN
1.0000 mg | Freq: Once | INTRAMUSCULAR | Status: AC
  Administered 2012-07-24: 1 mg via INTRAVENOUS
  Filled 2012-07-22: qty 1

## 2012-07-22 MED ORDER — PANTOPRAZOLE SODIUM 40 MG IV SOLR
40.0000 mg | Freq: Every day | INTRAVENOUS | Status: DC
  Administered 2012-07-22 – 2012-08-04 (×15): 40 mg via INTRAVENOUS
  Filled 2012-07-22 (×16): qty 40

## 2012-07-22 MED ORDER — ONDANSETRON HCL 4 MG/2ML IJ SOLN: 4.0000 mg | Freq: Four times a day (QID) | INTRAMUSCULAR | Status: DC | PRN

## 2012-07-22 MED ORDER — FUROSEMIDE 10 MG/ML IJ SOLN
20.0000 mg | Freq: Two times a day (BID) | INTRAMUSCULAR | Status: DC
  Administered 2012-07-22 – 2012-07-23 (×3): 20 mg via INTRAVENOUS
  Filled 2012-07-22 (×5): qty 2

## 2012-07-22 MED ORDER — SODIUM CHLORIDE 0.9 % IV SOLN: 250.0000 mL | INTRAVENOUS | Status: DC | PRN

## 2012-07-22 MED ORDER — IPRATROPIUM BROMIDE 0.02 % IN SOLN
0.5000 mg | Freq: Four times a day (QID) | RESPIRATORY_TRACT | Status: DC
  Administered 2012-07-22: 0.5 mg via RESPIRATORY_TRACT
  Administered 2012-07-23: 1 mg via RESPIRATORY_TRACT
  Administered 2012-07-23 – 2012-07-25 (×8): 0.5 mg via RESPIRATORY_TRACT
  Filled 2012-07-22 (×11): qty 2.5

## 2012-07-22 MED ORDER — ACETAMINOPHEN 325 MG PO TABS
650.0000 mg | ORAL_TABLET | ORAL | Status: DC | PRN
  Administered 2012-08-05 – 2012-08-13 (×3): 650 mg via ORAL
  Filled 2012-07-22 (×4): qty 2

## 2012-07-22 MED ORDER — ALBUTEROL SULFATE (5 MG/ML) 0.5% IN NEBU
2.5000 mg | INHALATION_SOLUTION | RESPIRATORY_TRACT | Status: DC | PRN
  Administered 2012-07-22 – 2012-07-23 (×3): 2.5 mg via RESPIRATORY_TRACT
  Filled 2012-07-22 (×5): qty 0.5

## 2012-07-22 MED ORDER — LEVOFLOXACIN IN D5W 750 MG/150ML IV SOLN
750.0000 mg | INTRAVENOUS | Status: DC
  Administered 2012-07-22 – 2012-07-28 (×6): 750 mg via INTRAVENOUS
  Filled 2012-07-22 (×8): qty 150

## 2012-07-22 MED ORDER — ASPIRIN 300 MG RE SUPP
300.0000 mg | Freq: Every day | RECTAL | Status: DC
  Administered 2012-07-22 – 2012-07-29 (×6): 300 mg via RECTAL
  Filled 2012-07-22 (×8): qty 1

## 2012-07-22 MED ORDER — ACETAMINOPHEN 650 MG RE SUPP
650.0000 mg | RECTAL | Status: DC | PRN
  Administered 2012-07-29: 650 mg via RECTAL
  Filled 2012-07-22: qty 1

## 2012-07-22 MED ORDER — SODIUM CHLORIDE 0.9 % IJ SOLN: 3.0000 mL | INTRAMUSCULAR | Status: DC | PRN

## 2012-07-22 MED ORDER — METOPROLOL TARTRATE 1 MG/ML IV SOLN
2.5000 mg | Freq: Two times a day (BID) | INTRAVENOUS | Status: DC
  Administered 2012-07-22 – 2012-08-06 (×27): 2.5 mg via INTRAVENOUS
  Filled 2012-07-22 (×33): qty 5

## 2012-07-22 MED ORDER — ASPIRIN 325 MG PO TABS
325.0000 mg | ORAL_TABLET | Freq: Every day | ORAL | Status: DC
  Administered 2012-07-28: 325 mg via ORAL
  Filled 2012-07-22 (×8): qty 1

## 2012-07-22 MED ORDER — SODIUM CHLORIDE 0.9 % IJ SOLN
3.0000 mL | Freq: Two times a day (BID) | INTRAMUSCULAR | Status: DC
  Administered 2012-07-22 – 2012-07-25 (×8): 3 mL via INTRAVENOUS

## 2012-07-22 NOTE — Progress Notes (Signed)
4.14.14 Called pt's son to follow up about the process for obtaining a pt's medical records. The phone number provided was disconnected.

## 2012-07-22 NOTE — Progress Notes (Signed)
Rehab Admissions Coordinator Note:  Patient was screened by Brock Ra for appropriateness for an Inpatient Acute Rehab Consult.  At this time, we are recommending Skilled Nursing Facility.    Brock Ra 07/22/2012, 4:14 PM  I can be reached at [phone].

## 2012-07-22 NOTE — Progress Notes (Signed)
  Echocardiogram 2D Echocardiogram has been performed.  Jenny Brady 07/22/2012, 10:56 AM

## 2012-07-22 NOTE — Clinical Social Work Note (Signed)
Clinical Social Work   CSW received consult for SNF. CSW reviewed chart and discussed pt with RN during progression. Awaiting PT/OT evals for discharge recommendations. PT/OT evals are needed for prior auth for SNF placement. CSW will assess for SNF, if appropriate. CSW will continue to follow.   Dartha Rozzell, MSW, LCSW #[phone] 

## 2012-07-22 NOTE — Evaluation (Signed)
Physical Therapy Evaluation Patient Details Name: Jenny Brady MRN: [id number] DOB: March 01, 1926 Today's Date: 07/22/2012 Time: 2956-2130 PT Time Calculation (min): 17 min  PT Assessment / Plan / Recommendation Clinical Impression  79 y/o female adm. for large left MCA distribution infarct. Presents to PT with below impairments affecting PLOF and independence. Will benefit from physical therapy in the acute setting to maximize functional independence and decrease BOC at next venue. Would benefit strongly from intensive comprehensive therapises following admission. Unsure of family situation or support available as no family was available during our session.     PT Assessment  Patient needs continued PT services    Follow Up Recommendations  CIR    Does the patient have the potential to tolerate intense rehabilitation      Barriers to Discharge Decreased caregiver support unsure of family support    Equipment Recommendations   (tbd)    Recommendations for Other Services OT consult;Speech consult   Frequency Min 4X/week    Precautions / Restrictions Precautions Precautions: Fall Precaution Comments: right neglect, left fixed gaze Restrictions Weight Bearing Restrictions: No   Pertinent Vitals/Pain Unable to articulate pain, did moan when we were moving her around      Mobility  Bed Mobility Bed Mobility: Supine to Sit Supine to Sit: 1: +2 Total assist Supine to Sit: Patient Percentage: 0% Details for Bed Mobility Assistance: pt resistant to sitting up needing total assist to bring her up and forward, sits rotated to the left; when asked to lie down pt appeared to be trying to rotate trunk to lie down but couldn't elevate her left leg to the bed so just began lie down backward on the bed needing +2totalpt 10% to assist with trunk rotation and repositioning in the bed Transfers Transfers: Not assessed         PT Diagnosis: Difficulty walking;Abnormality of  gait;Generalized weakness;Hemiplegia dominant side;Altered mental status  PT Problem List: Decreased strength;Decreased activity tolerance;Decreased balance;Decreased mobility;Impaired sensation;Decreased cognition PT Treatment Interventions: Gait training;Functional mobility training;Therapeutic activities;Therapeutic exercise;Balance training;Neuromuscular re-education;Cognitive remediation;Patient/family education;DME instruction   PT Goals Acute Rehab PT Goals PT Goal Formulation: With patient Time For Goal Achievement: 07/29/12 Potential to Achieve Goals: Good Pt will Roll Supine to Right Side: with mod assist PT Goal: Rolling Supine to Right Side - Progress: Goal set today Pt will Roll Supine to Left Side: with mod assist PT Goal: Rolling Supine to Left Side - Progress: Goal set today Pt will go Supine/Side to Sit: with mod assist PT Goal: Supine/Side to Sit - Progress: Goal set today Pt will Sit at Edge of Bed: with supervision;3-5 min;with no upper extremity support PT Goal: Sit at Edge Of Bed - Progress: Goal set today Pt will go Sit to Supine/Side: with mod assist PT Goal: Sit to Supine/Side - Progress: Goal set today Pt will go Sit to Stand: with mod assist PT Goal: Sit to Stand - Progress: Goal set today Pt will go Stand to Sit: with mod assist PT Goal: Stand to Sit - Progress: Goal set today Pt will Transfer Bed to Chair/Chair to Bed: with mod assist PT Transfer Goal: Bed to Chair/Chair to Bed - Progress: Goal set today Pt will Stand: with mod assist;1 - 2 min;with bilateral upper extremity support PT Goal: Stand - Progress: Goal set today Pt will Ambulate: 1 - 15 feet;with mod assist;with least restrictive assistive device PT Goal: Ambulate - Progress: Goal set today Additional Goals Additional Goal #1: Pt will follow one step commands 50%  of 20 minute PT session. PT Goal: Additional Goal #1 - Progress: Goal set today  Visit Information  Last PT Received On:  07/22/12 Assistance Needed: +2    Subjective Data  Subjective: Non verbal, did moan when we moved her Patient Stated Goal: unable to state   Prior Functioning  Home Living Lives With: Alone (per chart) Additional Comments: unable to determine as pt is aphasic    Cognition  Cognition Overall Cognitive Status: Difficult to assess Area of Impairment: Attention;Following commands;Awareness of errors;Awareness of deficits;Problem solving Difficult to assess due to: Impaired communication Arousal/Alertness: Awake/alert Orientation Level: Disoriented X4 Behavior During Session: Flat affect Current Attention Level: Focused Following Commands: Follows one step commands inconsistently Awareness of Errors - Other Comments: pt with fixed leg gaze preference, resisting therapist assisting her to turn her head to the right and closing her left eye at times, does not seem to be fixating on anything Cognition - Other Comments: responded to her name being called x2 during our session, followed simple 1 step commands maybe 5% of the session    Extremity/Trunk Assessment Right Upper Extremity Assessment RUE ROM/Strength/Tone: Deficits;Due to impaired cognition;Unable to fully assess RUE ROM/Strength/Tone Deficits: no activity noted distally, with passive range of shoulder I did feel 3/5 shoulder extension resistance, nothing purposeful  RUE Sensation: Deficits RUE Sensation Deficits: shoulder shrug type withdrawl and moan to painful stimuli, not attending to her right arm when I was passively moving it Left Upper Extremity Assessment LUE ROM/Strength/Tone: [facility] for tasks assessed;Unable to fully assess;Due to impaired cognition Right Lower Extremity Assessment RLE ROM/Strength/Tone: Unable to fully assess;Due to impaired cognition RLE Sensation: Deficits RLE Sensation Deficits: in tact to light touch however unable to test fully due to impaired cognition Left Lower Extremity Assessment LLE  ROM/Strength/Tone: Unable to fully assess;Due to impaired cognition LLE Sensation Deficits: unable to fully test due to impaired cognition   Balance Balance Balance Assessed: Yes Static Sitting Balance Static Sitting - Balance Support: Left upper extremity supported Static Sitting - Level of Assistance: 5: Stand by assistance;4: Min assist Static Sitting - Comment/# of Minutes: pt sits with trunk rotated left needing facilitation to square hips (maintains left fixed gaze), props herself on her left arm but was able to reach out and grab my arm to pull it near her, no major LOB once sitting EOB  End of Session PT - End of Session Equipment Utilized During Treatment: Gait belt;Oxygen Activity Tolerance: Patient tolerated treatment well Patient left: in bed;with call bell/phone within reach Nurse Communication: Mobility status  GP     [facility] HELEN 07/22/2012, 3:23 PM

## 2012-07-22 NOTE — Progress Notes (Signed)
Stroke Team Progress Note  HISTORY Jenny Brady is an 79 y.o. female with a past medical history significant for dementia, CAD, CHF, hyperthyroidism, transferred from [facility] for further management of a large left MCA distribution infarct. Patient is not able to provide information and thus all clinical data is gather from patient's medical record. She appaently lives by herself and today 07/21/2012 she was found by her son unresponsive with some respiratory distress and was taken to [facility] where she was found to be poorly responsive and unable to move the right side. An emergent CT brain revealed a large area of decreased attenuation in the left MCA distribution suggestive of an ischemic infarct. Transferred to [facility] for further management. Patient was not a TPA candidate secondary to unknown time of symptom onset.  SUBJECTIVE No one is at the bedside.    OBJECTIVE Most recent Vital Signs: Filed Vitals:   07/21/12 2216 07/22/12 0107 07/22/12 0500 07/22/12 0634  BP: 124/72 114/76  146/113  Pulse: 85 87    Temp: 98.4 F (36.9 C) 98.1 F (36.7 C)  98 F (36.7 C)  TempSrc: Axillary Oral  Oral  Resp: 22 20  20   Height:   5\' 6"  (1.676 m)   Weight:   85.548 kg (188 lb 9.6 oz)   SpO2: 98% 96%  95%   CBG (last 3)   Recent Labs  07/22/12 0112 07/22/12 0413 07/22/12 0807  GLUCAP 166* 175* 170*    IV Fluid Intake:     MEDICATIONS  . aspirin  300 mg Rectal Daily   Or  . aspirin  325 mg Oral Daily  . furosemide  20 mg Intravenous BID  . ipratropium  0.5 mg Nebulization Q6H  . levofloxacin (LEVAQUIN) IV  750 mg Intravenous Q24H  . LORazepam  1 mg Intravenous Once  . metoprolol  2.5 mg Intravenous Q12H  . pantoprazole (PROTONIX) IV  40 mg Intravenous QHS  . sodium chloride  3 mL Intravenous Q12H   PRN:  sodium chloride, acetaminophen, acetaminophen, albuterol, ondansetron (ZOFRAN) IV, sodium chloride  Diet:  NPO  Activity:  OOB with assistance DVT Prophylaxis:  SCDs   CLINICALLY  SIGNIFICANT STUDIES Basic Metabolic Panel:  Recent Labs Lab 07/21/12 1732  NA 138  K 5.1  CL 101  GLUCOSE 184*  BUN 16  CREATININE 0.90   Liver Function Tests:  Recent Labs Lab 07/21/12 1635  AST 16  ALT 12  ALKPHOS 107  BILITOT 0.6  PROT 6.4  ALBUMIN 2.7*   CBC:  Recent Labs Lab 07/21/12 1635 07/21/12 1732  WBC 6.3  --   NEUTROABS 4.4  --   HGB 10.7* 12.2  HCT 35.2* 36.0  MCV 77.7*  --   PLT 308  --    Coagulation: No results found for this basename: LABPROT, INR,  in the last 168 hours Cardiac Enzymes:  Recent Labs Lab 07/22/12 0032 07/22/12 0530  TROPONINI <0.30 <0.30   Urinalysis:  Recent Labs Lab 07/21/12 1734  COLORURINE YELLOW  LABSPEC 1.015  PHURINE 7.5  GLUCOSEU NEGATIVE  HGBUR NEGATIVE  BILIRUBINUR NEGATIVE  KETONESUR NEGATIVE  PROTEINUR 30*  UROBILINOGEN 1.0  NITRITE NEGATIVE  LEUKOCYTESUR SMALL*   Lipid Panel    Component Value Date/Time   CHOL 136 07/22/2012 0032   TRIG 93 07/22/2012 0032   HDL 27* 07/22/2012 0032   CHOLHDL 5.0 07/22/2012 0032   VLDL 19 07/22/2012 0032   LDLCALC 90 07/22/2012 0032   HgbA1C  Lab Results  Component  Value Date   HGBA1C 6.4* 04/27/2006   Urine Drug Screen:   No results found for this basename: labopia, cocainscrnur, labbenz, amphetmu, thcu, labbarb    Alcohol Level: No results found for this basename: ETH,  in the last 168 hours  CT of the brain  07/21/2012   1.  Large nonhemorrhagic left middle cerebral artery infarct with early edema without midline shift. 2.  Old left occipital infarct. 3.  Atrophy, most prominent in the temporal lobes.   MRI of the brain    MRA of the brain    2D Echocardiogram    Carotid Doppler  No evidence of hemodynamically significant internal carotid artery stenosis. Vertebral artery flow is antegrade.   CXR   07/22/2012    1.  Persistent pulmonary edema. 2.  No change in aeration to the left lower lobe.  07/21/2012  1.  Cardiomegaly and pulmonary edema. 2.  Small  left pleural effusion and basilar airspace disease which could be due to atelectasis or pneumonia.   EKG  normal sinus rhythm, LBBB.   Therapy Recommendations   Physical Exam   The patient is alert, non-verbal.   Obese. 1 to 2 plus edema at the ankles.  Left gaze preference, and no blink to threat from the right. Cannot doll the eyes across midline.  Right hemiparesis, arm> leg, severe. Will move the left side. The patient is mute, not following verbal commands.  Could not ambulate, and the patient will not follow commands for cerebellar testing.  DTR are depressed, symmetric, right babinski.   ASSESSMENT Jenny Brady is a 79 y.o. demented female presenting with unresponsiveness and respiratory distress after being found down at home. Imaging confirms a left large MCA infarct. Infarct felt to be embolic secondary to unknown etiology (most common etiology in this age group is atrial fibrillation).  On aspirin 81 mg orally every day prior to admission. Now on aspirin 325 mg orally every day for secondary stroke prevention. Patient with resultant right hemiparesis, global aphasia, dysphagia. Work up underway.   CAD  LDL 90  Morbid obesity  Hospital day # 1  TREATMENT/PLAN  Continue aspirin suppository for secondary stroke prevention.  F/u HgbA1c, 2D, MRI, MRA TEE to look for embolic source. Arranged with [facility] for tomorrow.  If positive for PFO (patent foramen ovale), check bilateral lower extremity venous dopplers to rule out DVT as possible source of stroke. Oob. Therapy evals.  [name], MSN, RN, ANVP-BC, ANP-BC, [facility] Pager: [phone] 07/22/2012 10:57 AM  I have personally obtained a history, examined the patient, evaluated imaging results, and formulated the assessment and plan of care. I agree with the above. [name]

## 2012-07-22 NOTE — Progress Notes (Signed)
VASCULAR LAB PRELIMINARY  PRELIMINARY  PRELIMINARY  PRELIMINARY  Carotid Dopplers completed.    Preliminary report:  There is no ICA stenosis.  Vertebral artery flow is antegrade.  Jahaziel Francois, RVT 07/22/2012, 9:40 AM

## 2012-07-22 NOTE — Progress Notes (Signed)
Patient ID: Jenny Brady  female  WUJ:[id number]    DOB: November 15, 1925    DOA: 07/21/2012  PCP: [name], MD  Assessment/Plan: Principal Problem:   Acute ischemic large left MCA stroke - CT head was obtained which showed large left MCA distribution infarct, most likely embolic. Not a candidate for thrombolytics due to unknown time of onset, delayed presentation. - Continue aspirin PR, - MRI of the brain showed large left hemispheric middle cerebral artery distribution infarct involving left temporal lobe, left frontal lobe and left opercular/sub insular region, involvement of the left lenticular nucleus/left corona radiata with local mass effect. Thrombus within the left middle cerebral artery suspected with occluded branches distal to this region. - PT, OT, speech evaluation - 2-D echo showed EF of 15%, severe tricuspid regurgitation, moderate PHTN, PAP 62, moderate MR -  carotid Dopplers showed no ICA stenosis, antegrade vertebral flow - TEE tomorrow to look for embolic source, if positive for PFO Will check bilateral lower extremity Dopplers to rule out DVT  Active Problems:   DEMENTIA-mod-severe: Unknown baseline however patient is alert but not following any commands with new large left MCA stroke    Hyperthyroidism-s/p RAI admin: will check TSH    Coronary artery disease with acute on chronic systolic CHF - 2-D echo shows EF of 15% on Lasix IV, BP currently stable, avoid hypotension     Dyspnea multifactorial likely due to acute on chronic systolic CHF and left lung pneumonia     CAP (community acquired pneumonia)- left lung -Currently on IV Levaquin, on      UTI (lower urinary tract infection): follow cultures, on levaquin  DVT Prophylaxis: SCD's  Code Status: Assumed Full code  Disposition: Called patient's son, Lynford Humphrey for update on listed number in epic [phone], it is disconnected.     Subjective: Alert but unresponsive, does not follow any  verbal commands  Objective: Weight change:   Intake/Output Summary (Last 24 hours) at 07/22/12 1649 Last data filed at 07/22/12 1200  Gross per 24 hour  Intake      0 ml  Output   1200 ml  Net  -1200 ml   Blood pressure 124/75, pulse 77, temperature 98 F (36.7 C), temperature source Oral, resp. rate 22, height 5\' 6"  (1.676 m), weight 85.548 kg (188 lb 9.6 oz), SpO2 98.00%.  Physical Exam: General: Alert and awake, does not follow any verbal commands, aphasia CVS: S1-S2 clear Chest: clear to auscultation anteriorly Abdomen: obese, soft, NT, ND, NBS Extremities: no c/c/e Neuro: Does not follow any commands  Lab Results: Basic Metabolic Panel:  Recent Labs Lab 07/21/12 1732  NA 138  K 5.1  CL 101  GLUCOSE 184*  BUN 16  CREATININE 0.90   Liver Function Tests:  Recent Labs Lab 07/21/12 1635  AST 16  ALT 12  ALKPHOS 107  BILITOT 0.6  PROT 6.4  ALBUMIN 2.7*   CBC:  Recent Labs Lab 07/21/12 1635 07/21/12 1732  WBC 6.3  --   NEUTROABS 4.4  --   HGB 10.7* 12.2  HCT 35.2* 36.0  MCV 77.7*  --   PLT 308  --    Cardiac Enzymes:  Recent Labs Lab 07/22/12 0032 07/22/12 0530 07/22/12 1207  TROPONINI <0.30 <0.30 <0.30   BNP: No components found with this basename: POCBNP,  CBG:  Recent Labs Lab 07/22/12 0112 07/22/12 0413 07/22/12 0807 07/22/12 1122 07/22/12 1629  GLUCAP 166* 175* 170* 181* 148*     Micro Results: No  results found for this or any previous visit (from the past 240 hour(s)).  Studies/Results: Dg Chest 1 View  07/21/2012  *RADIOLOGY REPORT*  Clinical Data: Shortness of breath and wheezing.  Altered mental status.  CHEST - 1 VIEW  Comparison: PA and lateral chest 07/11/2011.  Findings: There is cardiomegaly and pulmonary edema.  Small left effusion and basilar airspace disease are also identified.  IMPRESSION:  1.  Cardiomegaly and pulmonary edema. 2.  Small left pleural effusion and basilar airspace disease which could be due to  atelectasis or pneumonia.   Original Report Authenticated By: Holley Dexter, M.D.    Dg Chest 2 View  07/22/2012  *RADIOLOGY REPORT*  Clinical Data: Evaluate for infiltrate  CHEST - 2 VIEW  Comparison: 07/21/2012  Findings: Moderate cardiac enlargement.  There is atelectasis and airspace consolidation noted within the left lower lobe.  Mild diffuse interstitial edema is noted.  No new findings identified.  IMPRESSION:  1.  Persistent pulmonary edema. 2.  No change in aeration to the left lower lobe.   Original Report Authenticated By: Signa Kell, M.D.    Ct Head Wo Contrast  07/21/2012  *RADIOLOGY REPORT*  Clinical Data: Altered mental status.  The patient is unresponsive.  CT HEAD WITHOUT CONTRAST  Technique:  Contiguous axial images were obtained from the base of the skull through the vertex without contrast.  Comparison: 02/09/2008  Findings: The patient has an acute large non hemorrhagic left middle cerebral artery distribution infarct with slight edema.  No midline shift at this time. There is a hyper dense branch of the left middle cerebral artery seen on image number 11 of series 2, consistent with acute thrombosis.  There is an old left occipital infarct, new since 02/09/2008.  Diffuse cerebral cortical atrophy, most prominent in the temporal lobes.  No significant osseous abnormality.  IMPRESSION:  1.  Large nonhemorrhagic left middle cerebral artery infarct with early edema without midline shift. 2.  Old left occipital infarct. 3.  Atrophy, most prominent in the temporal lobes.   Original Report Authenticated By: Francene Boyers, M.D.    Mr Brain Wo Contrast  07/22/2012  *RADIOLOGY REPORT*  Clinical Data: Altered mental status.  Abnormal CT.  MRI HEAD WITHOUT CONTRAST  Technique:  Multiplanar, multiecho pulse sequences of the brain and surrounding structures were obtained according to standard protocol without intravenous contrast.  Comparison: 07/21/2012 CT.  03/08/2004 MR.  Findings: Motion  degraded exam.  MR angiogram was ordered although the patient not able to cooperate for this exam.  Large left hemispheric middle cerebral artery distribution infarct involving left temporal lobe, left frontal lobe and left opercular/sub insular region.  Involvement of the left lenticular nucleus/left corona radiata.  No intracranial hemorrhage.  Local mass effect without midline shift.  Remote left occipital lobe infarct with encephalomalacia.  Mild small vessel disease type changes.  Global atrophy without hydrocephalus.  No intracranial mass lesion detected on this unenhanced exam.  Limited evaluation of the intracranial vascular structures secondary to the motion.  Thrombus within the left middle cerebral artery suspected with occluded branches distal to this region.  IMPRESSION: Motion degraded exam.  Large left middle cerebral artery distribution infarct as noted above.  Thrombus within the left middle cerebral artery suspected with occluded branches distal to this region.  This has been made a PRA call report utilizing dashboard call feature.   Original Report Authenticated By: Lacy Duverney, M.D.     Medications: Scheduled Meds: . aspirin  300 mg Rectal Daily  Or  . aspirin  325 mg Oral Daily  . furosemide  20 mg Intravenous BID  . ipratropium  0.5 mg Nebulization Q6H  . levofloxacin (LEVAQUIN) IV  750 mg Intravenous Q24H  . LORazepam  1 mg Intravenous Once  . metoprolol  2.5 mg Intravenous Q12H  . pantoprazole (PROTONIX) IV  40 mg Intravenous QHS  . sodium chloride  3 mL Intravenous Q12H      LOS: 1 day   RAI,RIPUDEEP M.D. [facility] 07/22/2012, 4:49 PM Pager: [phone]  If 7PM-7AM, please contact night-coverage [email or web address] Password [email or web address]

## 2012-07-22 NOTE — Progress Notes (Signed)
Attempted evaluations earlier today however patient out of room. Will f/u 4/15.   Ferdinand Lango MA, CCC-SLP [phone]

## 2012-07-23 LAB — BASIC METABOLIC PANEL
BUN: 20 mg/dL (ref 6–23)
CO2: 30 mEq/L (ref 19–32)
Calcium: 8.4 mg/dL (ref 8.4–10.5)
Creatinine, Ser: 0.98 mg/dL (ref 0.50–1.10)
GFR calc non Af Amer: 52 mL/min — ABNORMAL LOW (ref >90)
Glucose, Bld: 125 mg/dL — ABNORMAL HIGH (ref 70–99)

## 2012-07-23 LAB — GLUCOSE, CAPILLARY
Glucose-Capillary: 127 mg/dL — ABNORMAL HIGH (ref 70–99)
Glucose-Capillary: 111 mg/dL — ABNORMAL HIGH (ref 70–99)
Glucose-Capillary: 117 mg/dL — ABNORMAL HIGH (ref 70–99)

## 2012-07-23 NOTE — Plan of Care (Signed)
I was notified by patient's RN, Victorino Dike that  patient's son (Mr Larine Fielding) came to visit his mother today and stated that "he is in rush" and did not stay to talk to me. He did provide a different phone number to call. I did call on the new phone number listed [phone]) twice and it goes into the voice mail (which speaks in different language). I feel unethical to leave a voice message about patient's update in case, this is not the son's number which would be a HIPAA violation.  I understand that he has declined anyone from palliative care team to contact him but we still need the goals of care addressed.    RAI,RIPUDEEP M.D. [facility] 07/23/2012, 6:27 PM  Pager: [phone]

## 2012-07-23 NOTE — Progress Notes (Signed)
Thank you for consulting the Palliative Medicine Team at [facility] to meet your patient's and family's needs.   The reason that you asked [place] to see your patient is for goals of care discussion  We have scheduled your patient for a meeting: unable to schedule meeting at this time -still trying to obtain a working phone number for son  The Surrogate decision maker is:  Unknown at this time Contact information: per chart son (? Spouse) [name] [phone] attempted to contact this number and received message that this line has been disconnected  Your patient is able/unable to participate: unable to participate- Large MCA stroke patient was non-responsive to light touch and loud voice on this visit  PMT card and contact information left in patient's room incase family visit- also spoke with staff RN to request she notify this writer should family arrive  Above information discussed with Dr Domingo Sep, RN 07/23/2012, 11:50 AM Palliative Medicine Team RN Liaison [phone]

## 2012-07-23 NOTE — Progress Notes (Signed)
Late Entry from 4.14.14   The pt's son Jenny Brady came to the unit. His attire was unkempt, and he was carrying a dilapidated briefcase. He demanded the pt medical records for her stay be printed off and given to him. He stated that he was her son and her lawyer, and that he wanted all of her records with all of the medications we had given her up until this point. He also demanded a daily medical record/written update from the doctor. He stated that he and his people would be looking over these papers. He said that he knew we had electronic records and that this was something that he should be getting.   The RN attempted to reassure the pt's son that we would do this best we could to update him about the pt's condition and to obtain the information he requested, but that we could not print off anything for him from the floor at this time. She stated that she would make a few calls to medical records to clarify about the hospital policies and ask the charge nurse to speak with him. The pt's son said that he could only wait 5 minutes.   The RN asked the son to wait in the room and she would get the charge nurse for him immediately. He said ok, but then he proceeded to follow the RN down the hallway and in passing told the charge nurse that he had already spoken to the ED and Medical Records about getting the information that he wanted. He said that the ED at [facility] would not give him the paperwork. He said that he had already been in court that morning and that he had needed the paperwork. When the charge nurse attempted to speak with him, he stated that we knew where to call him when he was needed and abruptly walked out.  The RN called medical records about setting something up for him. She also obtained a number he could call to speak with someone who could answer his questions. Once the policies were all sorted out, the RN attempted to contact the son at the number provided, but the phone line was  disconnected.  Later in the afternoon the son showed back up on the unit asking if his paperwork was ready. The RN told him the policy. She said that if he took proof of Health Care Power of Attorney down to medical records on the first floor, they had said that they would help him access the information he was requesting the best that they could. As the nurse attempted to ask him information regarding his contact information, the pt's admission/background documentation, and obtaining a copy of the HCPOA for the pt's medical record, he abruptly walked away. During this whole process the pt's son demonstrated a unusual affect.

## 2012-07-23 NOTE — Progress Notes (Addendum)
Pt.'s son Sama Arauz visited and spoke with the unit Chiropodist and this RN. Per the unit AD, Allena, he was standing in the hallway yelling to Ms. Beaubien and was initially resistant to entering the room. When this RN walked up to him to d/w him about some people needing to talk to him. He stated "I'm in a rush." This RN asked about speaking with palliative care team just to discuss Ms. Boultinghouse's wishes, he declined. He stated "It's too soon to talk to them." He adamantly declined anyone from Palliative Care Team contacting him. This RN did get an updated phone number from him which is now documented under emergency contacts. 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 Rae Mar then went to see his mom and came out and asked this RN "Did I just speak to you? I don't have a good memory. She wants to know if you can clean her lips up?" He then rushed off the unit.    Dr. Isidoro Donning notified of this.

## 2012-07-23 NOTE — Progress Notes (Signed)
Stroke Team Progress Note  HISTORY Jenny Brady is an 79 y.o. female with a past medical history significant for dementia, CAD, CHF, hyperthyroidism, transferred from [facility] for further management of a large left MCA distribution infarct. Patient is not able to provide information and thus all clinical data is gather from patient's medical record. She appaently lives by herself and today 07/21/2012 she was found by her son unresponsive with some respiratory distress and was taken to [facility] where she was found to be poorly responsive and unable to move the right side. An emergent CT brain revealed a large area of decreased attenuation in the left MCA distribution suggestive of an ischemic infarct. Transferred to [facility] for further management. Patient was not a TPA candidate secondary to unknown time of symptom onset.  SUBJECTIVE Patient up in chair at bedside. Has had apneic respirations over night.   OBJECTIVE Most recent Vital Signs: Filed Vitals:   07/23/12 0430 07/23/12 0600 07/23/12 0925 07/23/12 1040  BP:  118/60 120/50   Pulse:  72 70   Temp:  98.2 F (36.8 C) 97.8 F (36.6 C)   TempSrc:  Oral Oral   Resp:  18 20   Height:      Weight:      SpO2: 96% 97% 100% 96%   CBG (last 3)   Recent Labs  07/22/12 1957 07/22/12 2327 07/23/12 0512  GLUCAP 162* 134* 127*    IV Fluid Intake:     MEDICATIONS  . aspirin  300 mg Rectal Daily   Or  . aspirin  325 mg Oral Daily  . furosemide  20 mg Intravenous BID  . ipratropium  0.5 mg Nebulization Q6H  . levofloxacin (LEVAQUIN) IV  750 mg Intravenous Q24H  . LORazepam  1 mg Intravenous Once  . metoprolol  2.5 mg Intravenous Q12H  . pantoprazole (PROTONIX) IV  40 mg Intravenous QHS  . sodium chloride  3 mL Intravenous Q12H   PRN:  sodium chloride, acetaminophen, acetaminophen, albuterol, ondansetron (ZOFRAN) IV, sodium chloride  Diet:  NPO  Activity:  OOB with assistance DVT Prophylaxis:  SCDs   CLINICALLY SIGNIFICANT STUDIES Basic  Metabolic Panel:   Recent Labs Lab 07/21/12 1732 07/23/12 0510  NA 138 138  K 5.1 4.0  CL 101 100  CO2  --  30  GLUCOSE 184* 125*  BUN 16 20  CREATININE 0.90 0.98  CALCIUM  --  8.4   Liver Function Tests:   Recent Labs Lab 07/21/12 1635  AST 16  ALT 12  ALKPHOS 107  BILITOT 0.6  PROT 6.4  ALBUMIN 2.7*   CBC:   Recent Labs Lab 07/21/12 1635 07/21/12 1732  WBC 6.3  --   NEUTROABS 4.4  --   HGB 10.7* 12.2  HCT 35.2* 36.0  MCV 77.7*  --   PLT 308  --    Coagulation: No results found for this basename: LABPROT, INR,  in the last 168 hours Cardiac Enzymes:   Recent Labs Lab 07/22/12 0032 07/22/12 0530 07/22/12 1207  TROPONINI <0.30 <0.30 <0.30   Urinalysis:   Recent Labs Lab 07/21/12 1734  COLORURINE YELLOW  LABSPEC 1.015  PHURINE 7.5  GLUCOSEU NEGATIVE  HGBUR NEGATIVE  BILIRUBINUR NEGATIVE  KETONESUR NEGATIVE  PROTEINUR 30*  UROBILINOGEN 1.0  NITRITE NEGATIVE  LEUKOCYTESUR SMALL*   Lipid Panel    Component Value Date/Time   CHOL 136 07/22/2012 0032   TRIG 93 07/22/2012 0032   HDL 27* 07/22/2012 0032   CHOLHDL 5.0  07/22/2012 0032   VLDL 19 07/22/2012 0032   LDLCALC 90 07/22/2012 0032   HgbA1C  Lab Results  Component Value Date   HGBA1C 7.3* 07/22/2012   Urine Drug Screen:   No results found for this basename: labopia,  cocainscrnur,  labbenz,  amphetmu,  thcu,  labbarb    Alcohol Level: No results found for this basename: ETH,  in the last 168 hours  CT of the brain  07/21/2012   1.  Large nonhemorrhagic left middle cerebral artery infarct with early edema without midline shift. 2.  Old left occipital infarct. 3.  Atrophy, most prominent in the temporal lobes.   MRI of the brain  07/22/2012  Motion degraded exam.  Large left middle cerebral artery distribution infarct as noted above.  Thrombus within the left middle cerebral artery suspected with occluded branches distal to this region.    MRA of the brain    2D Echocardiogram  EF 15% with  no source of embolus.   Carotid Doppler  No evidence of hemodynamically significant internal carotid artery stenosis. Vertebral artery flow is antegrade.   CXR   07/22/2012    1.  Persistent pulmonary edema. 2.  No change in aeration to the left lower lobe.  07/21/2012  1.  Cardiomegaly and pulmonary edema. 2.  Small left pleural effusion and basilar airspace disease which could be due to atelectasis or pneumonia.   EKG  normal sinus rhythm, LBBB.   Therapy Recommendations CIR, CIR recommends SNF  Physical Exam   The patient is alert, non-verbal.   Obese. 1 to 2 plus edema at the ankles.  Left gaze preference, and no blink to threat from the right. Cannot doll the eyes across midline.  Right hemiparesis, arm> leg, severe. Will move the left side. The patient is mute, not following verbal commands.  Could not ambulate, and the patient will not follow commands for cerebellar testing.  DTR are depressed, symmetric, right babinski.   ASSESSMENT Jenny Brady is a 79 y.o. demented female presenting with unresponsiveness and respiratory distress after being found down at home. Imaging confirms a left large MCA infarct. Infarct felt to be embolic secondary to unknown etiology (most common etiology in this age group is atrial fibrillation).  On aspirin 81 mg orally every day prior to admission. Now on aspirin 325 mg orally every day for secondary stroke prevention. Patient with resultant right hemiparesis, global aphasia, dysphagia. Work up underway.   CAD  LDL 90  HgbA1c 7.3  Severe dysphagia, failed swallow eval  Morbid obesity  Baseline dementia  Community acquired pneumonia, on levaquin  UTI, on levaquin  Cardiomyopathy with EF 15%  Likely obstructive sleep apnea  The EF is in a range where anticoagulation should be considered. The TEE will help determine if an intraventricular clot is present. The patient is NPO, and she may require a PEG tube in the near future.  The patient cannot be anticoagulated until this decision is made. ST is following, but the patient remains too sedated to eat.  Hospital day # 2  TREATMENT/PLAN  Continue aspirin suppository for secondary stroke prevention.  F/u  MRA TEE today to look for embolic source. Arranged with [facility].  If positive for PFO (patent foramen ovale), check bilateral lower extremity venous dopplers to rule out DVT as possible source of stroke. Need to address swallow with family SNF at discharge  [facility], MSN, RN, ANVP-BC, ANP-BC, [facility] Pager: [phone] 07/23/2012 10:51  AM  I have personally obtained a history, examined the patient, evaluated imaging results, and formulated the assessment and plan of care. I agree with the above. Ledora Bottcher KEITH

## 2012-07-23 NOTE — Evaluation (Signed)
Speech Language Pathology Evaluation Patient Details Name: Jenny Brady MRN: [id number] DOB: 27-Sep-1925 Today's Date: 07/23/2012 Time: 0865-7846 SLP Time Calculation (min): 14 min  Problem List:  Patient Active Problem List  Diagnosis  . DEMENTIA-mod-severe  . ANXIETY  . CAD (?stent) cath 90's [place]  . HYPOTENSION  . DUODENITIS  . UNSPECIFIED CYSTITIS  . VERTIGO  . SYMPTOM, MEMORY LOSS  . UNSTEADY GAIT  . Hyperthyroidism-s/p RAI admin  . Coronary artery disease  . GERD (gastroesophageal reflux disease)  . CP-Pe vs ACS  . Systolic CHF, acute on chronic  . Syncope  . Normocytic anemia  . Hypokalemia  . CKD (chronic kidney disease) stage 3, GFR 30-59 ml/min  . Acute ischemic left MCA stroke  . Systolic CHF  . Dyspnea  . Abnormal CXR  . Hyperglycemia without ketosis  . UTI (lower urinary tract infection)  . CAP (community acquired pneumonia)- left lung   Past Medical History:  Past Medical History  Diagnosis Date  . Hyperthyroidism     had RAI-is on thyroid replacement, happened maybe this summer  . Coronary artery disease     Had a catheterization per son by Dr. [name] in 1991 no reports in  [place]  . GERD (gastroesophageal reflux disease)    Past Surgical History:  Past Surgical History  Procedure Laterality Date  . None     HPI:  79 yo female adm [facility]/21/12 with AMS.  Pt CT Head revealed a large nonhemmorhagic CVA in Left MCA region, old occipital lobe CVA new since 11/09 and atrophy.  Pt also diagnosed with left lobe pna and had periods of apnea in the middle of the night - now is on ventimask at 9 liters for oxygen support.  Order received for speech and swallow evaluations.     Assessment / Plan / Recommendation Clinical Impression  Pt presents with global aphasia without ability to follow one step commands, answer y/n question or attempt to articulate.  She did nod yes to her verbalized proper name.  Pt only moans when she is stimulated  physically (eg slid up in the bed).  Pt was alert with her eyes open during evaluation.  Pt will benefit from aggressive skilled speech therapy services to maximize her functional speech/language to decrease caregiver burden.      SLP Assessment  Patient needs continued Speech Lanaguage Pathology Services    Follow Up Recommendations  Inpatient Rehab;Skilled Nursing facility (vs - TBD)    Frequency and Duration min 2x/week  1 week   Pertinent Vitals/Pain Afebrile, decreased, no s/s of pain   SLP Goals  SLP Goals Potential to Achieve Goals: Fair Potential Considerations: Co-morbidities;Severity of impairments;Previous level of function;Medical prognosis Progress/Goals/Alternative treatment plan discussed with pt/caregiver and they: No caregivers available SLP Goal #1: Pt will follow one step basic commands within functional activity with max verbal/tactile/visual cues and 75% accuracy.  SLP Goal #2: Pt will answer basic biographical/environmental  y/n questions with max verbal/tactile/visual cues and 75% accuracy.   SLP Goal #3: Pt will approximate articulation of bilabial sounds with total verbal/visual/tactile assist and 75% accuracy.    SLP Evaluation Prior Functioning  Cognitive/Linguistic Baseline: Baseline deficits Baseline deficit details: per chart pt has baseline dementia   Cognition  Overall Cognitive Status: Impaired (pt with severe aphasia, known dementia) Arousal/Alertness: Awake/alert Orientation Level: Other (comment) (uta, non-verbal) Attention: Focused Focused Attention: Impaired Focused Attention Impairment: Functional basic    Comprehension  Auditory Comprehension Overall Auditory Comprehension: Impaired Yes/No  Questions: Impaired Basic Biographical Questions: 0-25% accurate Commands: Impaired One Step Basic Commands: 0-24% accurate Other Conversation Comments: pt not at level of comprehending conversation Visual Recognition/Discrimination Discrimination:  Exceptions to [facility] Common Objects: Unable to indentify Reading Comprehension Reading Status: Not tested (suspect visual deficits h/o occipital cva)    Expression Expression Primary Mode of Expression: Verbal Verbal Expression Overall Verbal Expression: Impaired Initiation: Impaired Automatic Speech:  (pt did not conduct automatic speech with total cues) Repetition:  (pt did not repeat or attempt to mimic labial position) Naming: Not tested Common Objects: Unable to indentify Pragmatics: Unable to assess Other Verbal Expression Comments: pt moans only when being moved or physically stimulated, does not attempt to articulate Written Expression Dominant Hand:  (unknown)   Oral / Motor Oral Motor/Sensory Function Overall Oral Motor/Sensory Function: Impaired (right labial asymmetry, pt not follow commands, grossly weak) Facial ROM: Reduced right Facial Symmetry: Right droop Facial Strength: Reduced Velum:  (UTA) Mandible:  (UTA)   GO     Donavan Burnet, MS [facility] SLP [phone]

## 2012-07-23 NOTE — Progress Notes (Signed)
Patient ID: Jenny Brady  female  ZOX:[id number]    DOB: 1925-08-14    DOA: 07/21/2012  PCP: [name], MD  Assessment/Plan: Principal Problem:   Acute ischemic large left MCA stroke: Still not following any verbal commands - CT head was obtained which showed large left MCA distribution infarct, most likely embolic. Not a candidate for thrombolytics due to unknown time of onset, delayed presentation. - Continue aspirin PR until taking oral - MRI of the brain showed large left hemispheric middle cerebral artery distribution infarct involving left temporal lobe, left frontal lobe and left opercular/sub insular region, involvement of the left lenticular nucleus/left corona radiata with local mass effect. Thrombus within the left middle cerebral artery suspected with occluded branches distal to this region. - PT, OT, recommending skilled nursing facility - 2-D echo showed EF of 15%, severe tricuspid regurgitation, moderate PHTN, PAP 62, moderate MR -  carotid Dopplers showed no ICA stenosis, antegrade vertebral flow - TEE pending today, if positive for PFO Will check bilateral lower extremity Dopplers to rule out DVT - Discussed with neurology, Dr. Anne Hahn in detail, per stroke service, it may take several days to week for patient to be alert and tolerating anything overall if she improves. Patient is currently n.p.o. and not able to participate with speech therapy evaluation, may require PEG tube in your future.  I am unable to contact patient's health power of attorney, son,James Amprazias as the number is disconnected. Will need to place [facility] or NGT and start on tube feeds for the well being of the patient until the full goals of care are addressed regarding artificial nutrition. I called Palliative Medicine consultation today for goals of care unfortunately they were unable to reach the patient's son as well.     Active Problems:   DEMENTIA-mod-severe: Unknown baseline however  patient is alert but not following any commands with new large left MCA stroke    Hyperthyroidism-s/p RAI admin: TSH in process    Coronary artery disease with acute on chronic systolic CHF (EF was 25-30% in 2012) - 2-D echo shows EF of 15% , BP currently borderline low - Will hold her Lasix to avoid hypotension in view of acute CVA    Dyspnea multifactorial likely due to acute on chronic systolic CHF and left lung pneumonia     CAP (community acquired pneumonia)- left lung -Currently on IV Levaquin, nebs     UTI (lower urinary tract infection):  - Urine culture showed no growth  DVT Prophylaxis: SCD's  Code Status: Assumed Full code  Disposition: Called patient's son, Lynford Humphrey again but again disconnected phone number. Told the staff to page me whenever he visits, so I can contact him.     Subjective: Alert but unresponsive, does not follow any verbal commands  Objective: Weight change:   Intake/Output Summary (Last 24 hours) at 07/23/12 1429 Last data filed at 07/22/12 2000  Gross per 24 hour  Intake      0 ml  Output   1000 ml  Net  -1000 ml   Blood pressure 106/46, pulse 76, temperature 98 F (36.7 C), temperature source Axillary, resp. rate 18, height 5\' 6"  (1.676 m), weight 85.548 kg (188 lb 9.6 oz), SpO2 97.00%.  Physical Exam: General: does not follow any verbal commands, aphasia CVS: S1-S2 clear Chest: clear to auscultation anteriorly Abdomen: obese, soft, NT, ND, NBS Extremities: no c/c/e Neuro: Does not follow any commands  Lab Results: Basic Metabolic Panel:  Recent Labs Lab  07/21/12 1732 07/23/12 0510  NA 138 138  K 5.1 4.0  CL 101 100  CO2  --  30  GLUCOSE 184* 125*  BUN 16 20  CREATININE 0.90 0.98  CALCIUM  --  8.4   Liver Function Tests:  Recent Labs Lab 07/21/12 1635  AST 16  ALT 12  ALKPHOS 107  BILITOT 0.6  PROT 6.4  ALBUMIN 2.7*   CBC:  Recent Labs Lab 07/21/12 1635 07/21/12 1732  WBC 6.3  --   NEUTROABS 4.4   --   HGB 10.7* 12.2  HCT 35.2* 36.0  MCV 77.7*  --   PLT 308  --    Cardiac Enzymes:  Recent Labs Lab 07/22/12 0032 07/22/12 0530 07/22/12 1207  TROPONINI <0.30 <0.30 <0.30   BNP: No components found with this basename: POCBNP,  CBG:  Recent Labs Lab 07/22/12 1629 07/22/12 1957 07/22/12 2327 07/23/12 0512 07/23/12 1152  GLUCAP 148* 162* 134* 127* 129*     Micro Results: Recent Results (from the past 240 hour(s))  URINE CULTURE     Status: None   Collection Time    07/21/12  5:34 PM      Result Value Range Status   Specimen Description URINE, CATHETERIZED   Final   Special Requests NONE   Final   Culture  Setup Time 07/21/2012 21:22   Final   Colony Count NO GROWTH   Final   Culture NO GROWTH   Final   Report Status 07/22/2012 FINAL   Final    Studies/Results: Dg Chest 1 View  07/21/2012  *RADIOLOGY REPORT*  Clinical Data: Shortness of breath and wheezing.  Altered mental status.  CHEST - 1 VIEW  Comparison: PA and lateral chest 07/11/2011.  Findings: There is cardiomegaly and pulmonary edema.  Small left effusion and basilar airspace disease are also identified.  IMPRESSION:  1.  Cardiomegaly and pulmonary edema. 2.  Small left pleural effusion and basilar airspace disease which could be due to atelectasis or pneumonia.   Original Report Authenticated By: Holley Dexter, M.D.    Dg Chest 2 View  07/22/2012  *RADIOLOGY REPORT*  Clinical Data: Evaluate for infiltrate  CHEST - 2 VIEW  Comparison: 07/21/2012  Findings: Moderate cardiac enlargement.  There is atelectasis and airspace consolidation noted within the left lower lobe.  Mild diffuse interstitial edema is noted.  No new findings identified.  IMPRESSION:  1.  Persistent pulmonary edema. 2.  No change in aeration to the left lower lobe.   Original Report Authenticated By: Signa Kell, M.D.    Ct Head Wo Contrast  07/21/2012  *RADIOLOGY REPORT*  Clinical Data: Altered mental status.  The patient is  unresponsive.  CT HEAD WITHOUT CONTRAST  Technique:  Contiguous axial images were obtained from the base of the skull through the vertex without contrast.  Comparison: 02/09/2008  Findings: The patient has an acute large non hemorrhagic left middle cerebral artery distribution infarct with slight edema.  No midline shift at this time. There is a hyper dense branch of the left middle cerebral artery seen on image number 11 of series 2, consistent with acute thrombosis.  There is an old left occipital infarct, new since 02/09/2008.  Diffuse cerebral cortical atrophy, most prominent in the temporal lobes.  No significant osseous abnormality.  IMPRESSION:  1.  Large nonhemorrhagic left middle cerebral artery infarct with early edema without midline shift. 2.  Old left occipital infarct. 3.  Atrophy, most prominent in the temporal lobes.  Original Report Authenticated By: Francene Boyers, M.D.    Mr Brain Wo Contrast  07/22/2012  *RADIOLOGY REPORT*  Clinical Data: Altered mental status.  Abnormal CT.  MRI HEAD WITHOUT CONTRAST  Technique:  Multiplanar, multiecho pulse sequences of the brain and surrounding structures were obtained according to standard protocol without intravenous contrast.  Comparison: 07/21/2012 CT.  03/08/2004 MR.  Findings: Motion degraded exam.  MR angiogram was ordered although the patient not able to cooperate for this exam.  Large left hemispheric middle cerebral artery distribution infarct involving left temporal lobe, left frontal lobe and left opercular/sub insular region.  Involvement of the left lenticular nucleus/left corona radiata.  No intracranial hemorrhage.  Local mass effect without midline shift.  Remote left occipital lobe infarct with encephalomalacia.  Mild small vessel disease type changes.  Global atrophy without hydrocephalus.  No intracranial mass lesion detected on this unenhanced exam.  Limited evaluation of the intracranial vascular structures secondary to the motion.   Thrombus within the left middle cerebral artery suspected with occluded branches distal to this region.  IMPRESSION: Motion degraded exam.  Large left middle cerebral artery distribution infarct as noted above.  Thrombus within the left middle cerebral artery suspected with occluded branches distal to this region.  This has been made a PRA call report utilizing dashboard call feature.   Original Report Authenticated By: Lacy Duverney, M.D.     Medications: Scheduled Meds: . aspirin  300 mg Rectal Daily   Or  . aspirin  325 mg Oral Daily  . furosemide  20 mg Intravenous BID  . ipratropium  0.5 mg Nebulization Q6H  . levofloxacin (LEVAQUIN) IV  750 mg Intravenous Q24H  . LORazepam  1 mg Intravenous Once  . metoprolol  2.5 mg Intravenous Q12H  . pantoprazole (PROTONIX) IV  40 mg Intravenous QHS  . sodium chloride  3 mL Intravenous Q12H      LOS: 2 days   RAI,RIPUDEEP M.D. [facility] 07/23/2012, 2:29 PM Pager: [phone]  If 7PM-7AM, please contact night-coverage [email or web address] Password [email or web address]

## 2012-07-23 NOTE — Progress Notes (Signed)
Physical Therapy Treatment Patient Details Name: Jenny Brady MRN: [id number] DOB: March 24, 1926 Today's Date: 07/23/2012 Time: 7829-5621 PT Time Calculation (min): 34 min  PT Assessment / Plan / Recommendation Comments on Treatment Session  pt with large L MCA CVA.  In treatment today, pt still unable to verbalize or follow any commands purposefully.  She does acknowledge her name and attempts to track to the sound.  She cannot maintain attention for more than 10's of secs.  Sitting balance remains stable with pt able to return to midline from L or R from outside BOS.  On standing pt assisted primarily with L LE, but noted trace assist from R LE    Follow Up Recommendations  CIR     Does the patient have the potential to tolerate intense rehabilitation     Barriers to Discharge        Equipment Recommendations       Recommendations for Other Services    Frequency     Plan Discharge plan remains appropriate;Frequency remains appropriate    Precautions / Restrictions Precautions Precautions: Fall Precaution Comments: right neglect, left fixed gaze, wears R mitt Restrictions Weight Bearing Restrictions: No   Pertinent Vitals/Pain sats on rebreather at 95-97%, HR 60-80's    Mobility  Bed Mobility Bed Mobility: Supine to Sit;Sitting - Scoot to Edge of Bed Supine to Sit: 1: +2 Total assist Supine to Sit: Patient Percentage: 20% Sitting - Scoot to Edge of Bed: 1: +1 Total assist Details for Bed Mobility Assistance: Initially pt resistent to sitting up, then assisted minimally.  tc's for direction Transfers Transfers: Sit to Stand;Stand to Sit;Stand Pivot Transfers Sit to Stand: 1: +2 Total assist;With upper extremity assist;From bed Sit to Stand: Patient Percentage: 30% Stand to Sit: 1: +2 Total assist;To chair/3-in-1 Stand to Sit: Patient Percentage: 30% Stand Pivot Transfers: 1: +2 Total assist Stand Pivot Transfers: Patient Percentage: 30% Details for Transfer  Assistance: v/tc's for guiding; assist to come forward and for stability.  Support of R knee for transfer.  Pt made attempts to pivot given therapy assist Ambulation/Gait Ambulation/Gait Assistance: Not tested (comment) Stairs: No Wheelchair Mobility Wheelchair Mobility: No Modified Rankin (Stroke Patients Only) Modified Rankin: Severe disability    Exercises General Exercises - Upper Extremity Shoulder Flexion: PROM;Right;10 reps Shoulder Extension: PROM;Right;10 reps Shoulder ABduction: PROM;Right;10 reps Shoulder ADduction: PROM;Right;10 reps Shoulder Horizontal ABduction: 10 reps Elbow Flexion: PROM;Right;10 reps Elbow Extension: PROM;Right Wrist Flexion: PROM;Right Digit Composite Flexion: PROM;Right;10 reps   PT Diagnosis:    PT Problem List:   PT Treatment Interventions:     PT Goals Acute Rehab PT Goals Time For Goal Achievement: 07/29/12 Potential to Achieve Goals: Fair Pt will go Supine/Side to Sit: with mod assist PT Goal: Supine/Side to Sit - Progress: Progressing toward goal Pt will Sit at Edge of Bed: with supervision;3-5 min;with no upper extremity support PT Goal: Sit at Edge Of Bed - Progress: Progressing toward goal Pt will go Sit to Supine/Side: with mod assist PT Goal: Sit to Supine/Side - Progress: Progressing toward goal Pt will go Sit to Stand: with mod assist PT Goal: Sit to Stand - Progress: Progressing toward goal Pt will go Stand to Sit: with mod assist PT Goal: Stand to Sit - Progress: Progressing toward goal Pt will Transfer Bed to Chair/Chair to Bed: with mod assist PT Transfer Goal: Bed to Chair/Chair to Bed - Progress: Progressing toward goal Pt will Stand: with mod assist;1 - 2 min;with bilateral upper extremity support PT Goal:  Stand - Progress: Not met Pt will Ambulate: 1 - 15 feet;with mod assist;with least restrictive assistive device PT Goal: Ambulate - Progress: Not met Additional Goals Additional Goal #1: Pt will follow one step  commands 50% of 20 minute PT session. PT Goal: Additional Goal #1 - Progress: Not met  Visit Information  Last PT Received On: 07/23/12 Assistance Needed: +2    Subjective Data  Subjective: non verbal   Cognition  Cognition Overall Cognitive Status: Difficult to assess Area of Impairment: Attention;Following commands;Awareness of errors;Awareness of deficits;Problem solving Difficult to assess due to: Impaired communication Arousal/Alertness: Awake/alert Orientation Level: Disoriented X4 Behavior During Session: Flat affect Current Attention Level: Alternating Following Commands: Follows one step commands inconsistently Awareness of Errors - Other Comments: pt with fixed leg gaze preference, resisting therapist assisting her to turn her head to the right and closing her left eye at times, does not seem to be fixating on anything Cognition - Other Comments: responded to her name being called during tx session    Balance  Balance Balance Assessed: Yes Static Sitting Balance Static Sitting - Balance Support: No upper extremity supported Static Sitting - Level of Assistance: Other (comment) (min guard assist) Static Sitting - Comment/# of Minutes: Pt able to support self seated EOB with R UE, required min guard assist, good righting responses Dynamic Sitting Balance Dynamic Sitting - Balance Support: Feet unsupported;Left upper extremity supported Dynamic Sitting - Level of Assistance: 5: Stand by assistance (and easily returned to midline position from outside BOS)  End of Session PT - End of Session Equipment Utilized During Treatment: Oxygen Activity Tolerance: Patient tolerated treatment well Patient left: in bed;with call bell/phone within reach Nurse Communication: Mobility status;Need for lift equipment   GP     Jenny Brady, Jenny Brady 07/23/2012, 11:04 AM 07/23/2012  Jenny Brady, PT [phone] (pager)

## 2012-07-23 NOTE — Progress Notes (Signed)
INITIAL NUTRITION ASSESSMENT  DOCUMENTATION CODES Per approved criteria  -Obesity Unspecified   INTERVENTION: Supplement diet as appropriate.   If enteral nutrition desired recommend to start Jevity 1.2 @ 20 ml/hr and increase by 10 ml every 4 hours to goal rate of 60 ml/hr (1728 kcal, 80 grams protein, 1166 ml H2O)  NUTRITION DIAGNOSIS: Inadequate oral intake related to inability to eat as evidenced by NPO status.  Goal: Pt to meet >/= 90% of their estimated nutrition needs.   Monitor:  Plan of care, weight trend, labs  Reason for Assessment: Low Braden  79 y.o. female  Admitting Dx: Acute ischemic left MCA stroke  ASSESSMENT: Pt admitted with large MCA stroke. Pt discussed during rounds MD and team. Per MD pt less alert today and was not able to pass a swallow eval. Pt requiring high volume of O2. Per MD she will request a Palliative care consult.     Height: Ht Readings from Last 1 Encounters:  07/22/12 5\' 6"  (1.676 m)    Weight: Wt Readings from Last 1 Encounters:  07/22/12 188 lb 9.6 oz (85.548 kg)    Ideal Body Weight: 59 kg  % Ideal Body Weight: 145%  Wt Readings from Last 10 Encounters:  07/22/12 188 lb 9.6 oz (85.548 kg)  04/06/11 168 lb 10.4 oz (76.5 kg)  02/19/08 177 lb (80.287 kg)  09/28/07 175 lb (79.379 kg)    Usual Body Weight: no recent hx known  % Usual Body Weight: -  BMI:  Body mass index is 30.46 kg/(m^2). Obesity Class I   Estimated Nutritional Needs: Kcal: 1600-1750 Protein: 80-90 grams Fluid: > 1.6 L/day  Skin: no issues noted  Diet Order: NPO  EDUCATION NEEDS: -No education needs identified at this time   Intake/Output Summary (Last 24 hours) at 07/23/12 1007 Last data filed at 07/22/12 2000  Gross per 24 hour  Intake      0 ml  Output   2200 ml  Net  -2200 ml    Last BM: 4/13  Labs:   Recent Labs Lab 07/21/12 1732 07/23/12 0510  NA 138 138  K 5.1 4.0  CL 101 100  CO2  --  30  BUN 16 20  CREATININE  0.90 0.98  CALCIUM  --  8.4  GLUCOSE 184* 125*    CBG (last 3)   Recent Labs  07/22/12 1957 07/22/12 2327 07/23/12 0512  GLUCAP 162* 134* 127*    Scheduled Meds: . aspirin  300 mg Rectal Daily   Or  . aspirin  325 mg Oral Daily  . furosemide  20 mg Intravenous BID  . ipratropium  0.5 mg Nebulization Q6H  . levofloxacin (LEVAQUIN) IV  750 mg Intravenous Q24H  . LORazepam  1 mg Intravenous Once  . metoprolol  2.5 mg Intravenous Q12H  . pantoprazole (PROTONIX) IV  40 mg Intravenous QHS  . sodium chloride  3 mL Intravenous Q12H    Continuous Infusions:   Past Medical History  Diagnosis Date  . Hyperthyroidism     had RAI-is on thyroid replacement, happened maybe this summer  . Coronary artery disease     Had a catheterization per son by Dr. [name] in 1991 no reports in  [place]  . GERD (gastroesophageal reflux disease)     Past Surgical History  Procedure Laterality Date  . None      Kendell Bane RD, LDN, CNSC [phone] Pager [phone] After Hours Pager

## 2012-07-23 NOTE — Progress Notes (Signed)
Placed patient on Cpap of 10 with 4lpm bleed in.  Patient is tolerating well with saturation of 100%

## 2012-07-23 NOTE — Evaluation (Signed)
Occupational Therapy Evaluation Patient Details Name: Jenny Brady MRN: [id number] DOB: [birth year] Today's Date: 07/23/2012 Time: 4098-1191 OT Time Calculation (min): 34 min  OT Assessment / Plan / Recommendation Clinical Impression  Pt demos decline in function with ADLs, ROM, strength, vision, balance, activity tolerance and safety following large L MCA stroke. Pt would benefit from OT services to help restore maximize level of function and safety    OT Assessment  Patient needs continued OT Services    Follow Up Recommendations  SNF    Barriers to Discharge Decreased caregiver support. Uncertain of adequacy of care/assist that would be provided    Equipment Recommendations   TBD   Recommendations for Other Services    Frequency  Min 3X/week    Precautions / Restrictions Precautions Precautions: Fall Precaution Comments: right neglect, left fixed gaze, wears R mitt Restrictions Weight Bearing Restrictions: No       ADL  Eating/Feeding: NPO Grooming: Performed;Wash/dry face;Moderate assistance Where Assessed - Grooming: Supported sitting Upper Body Bathing: +1 Total assistance Lower Body Bathing: +1 Total assistance Upper Body Dressing: +1 Total assistance Lower Body Dressing: +1 Total assistance Toilet Transfer: +2 Total assistance;Simulated Toilet Transfer Method: Sit to stand Toileting - Clothing Manipulation and Hygiene: +1 Total assistance    OT Diagnosis: Generalized weakness;Acute pain;Paresis  OT Problem List: Decreased strength;Impaired vision/perception;Decreased knowledge of use of DME or AE;Decreased knowledge of precautions;Decreased coordination;Decreased range of motion;Decreased activity tolerance;Impaired UE functional use;Pain;Decreased safety awareness;Impaired balance (sitting and/or standing);Decreased cognition OT Treatment Interventions: Self-care/ADL training;Balance training;Therapeutic exercise;Neuromuscular education;Therapeutic  activities;Energy conservation;DME and/or AE instruction;Patient/family education   OT Goals Acute Rehab OT Goals OT Goal Formulation: With patient Time For Goal Achievement: 07/30/12 Potential to Achieve Goals: Good ADL Goals Pt Will Perform Grooming: with min assist;Sitting, edge of bed;Supported;with cueing (comment type and amount) ADL Goal: Grooming - Progress: Goal set today Pt Will Perform Upper Body Bathing: with max assist;with mod assist;Sitting, edge of bed;Supported;with cueing (comment type and amount) ADL Goal: Upper Body Bathing - Progress: Goal set today Pt Will Transfer to Toilet: with max assist;with 1+ total assist;with DME ADL Goal: Toilet Transfer - Progress: Goal set today Additional ADL Goal #1: Pt will attend to R side to perform grooming and UB bathing tasks with mod verbal/physical cues to initiate and complete while seated with support at EOB ADL Goal: Additional Goal #1 - Progress: Goal set today Arm Goals Pt Will Tolerate PROM: to decrease contracture;Right upper extremity;3 sets;10 reps Arm Goal: PROM - Progress: Goal set today  Visit Information  Last OT Received On: 07/23/12 Assistance Needed: +2 PT/OT Co-Evaluation/Treatment: Yes    Subjective Data  Subjective: Pt only moans and groans Patient Stated Goal: None stated   Prior Functioning     Home Living Lives With: Alone;Other (Comment) (per chart) Additional Comments: unable to determine as pt is aphasic Prior Function Comments: unable to obatain PLOF, pt aphasic         Vision/Perception Vision - History Baseline Vision: Other (comment) (baseline unknown) Patient Visual Report: Other (comment) (visual history unknown) Perception Perception: Impaired Inattention/Neglect: Does not attend to right visual field;Does not attend to right side of body   Cognition  Cognition Overall Cognitive Status: Difficult to assess Area of Impairment: Attention;Following commands;Awareness of  errors;Awareness of deficits;Problem solving Difficult to assess due to: Impaired communication Arousal/Alertness: Awake/alert Orientation Level: Disoriented X4 Behavior During Session: Flat affect Current Attention Level: Alternating Following Commands: Follows one step commands inconsistently Awareness of Errors - Other Comments: pt with  fixed leg gaze preference, resisting therapist assisting her to turn her head to the right and closing her left eye at times, does not seem to be fixating on anything Cognition - Other Comments: responded to her name being called during tx session    Extremity/Trunk Assessment Right Upper Extremity Assessment RUE ROM/Strength/Tone: Deficits;Due to impaired cognition;Unable to fully assess RUE ROM/Strength/Tone Deficits: no activity noted distally, with passive range of shoulder I did feel 3/5 shoulder extension resistance, nothing purposeful  RUE Sensation: Deficits RUE Sensation Deficits: shoulder shrug type withdrawl and moan to painful stimuli, not attending to her right arm when I was passively moving it     Mobility Bed Mobility Bed Mobility: Supine to Sit;Sitting - Scoot to Edge of Bed Supine to Sit: 1: +2 Total assist Supine to Sit: Patient Percentage: 20% Sitting - Scoot to Edge of Bed: 1: +1 Total assist Details for Bed Mobility Assistance: Initially pt resistent to sitting up, then assisted minimally.  tc's for direction Transfers Sit to Stand: 1: +2 Total assist;With upper extremity assist;From bed Sit to Stand: Patient Percentage: 30% Stand to Sit: 1: +2 Total assist;To chair/3-in-1 Stand to Sit: Patient Percentage: 30% Details for Transfer Assistance: v/tc's for guiding; assist to come forward and for stability.  Support of R knee for transfer.  Pt made attempts to pivot given therapy assist     Exercise General Exercises - Upper Extremity Shoulder Flexion: PROM;Right;10 reps Shoulder Extension: PROM;Right;10 reps Shoulder ABduction:  PROM;Right;10 reps Shoulder ADduction: PROM;Right;10 reps Shoulder Horizontal ABduction: 10 reps Elbow Flexion: PROM;Right;10 reps Elbow Extension: PROM;Right Wrist Flexion: PROM;Right Digit Composite Flexion: PROM;Right;10 reps   Balance Balance Balance Assessed: Yes Static Sitting Balance Static Sitting - Balance Support: No upper extremity supported Static Sitting - Level of Assistance: Other (comment) (min guard assist) Static Sitting - Comment/# of Minutes: Pt able to support self seated EOB with R UE, required min guard assist, good righting responses Dynamic Sitting Balance Dynamic Sitting - Balance Support: Feet unsupported;Left upper extremity supported Dynamic Sitting - Level of Assistance: 5: Stand by assistance (and easily returned to midline position from outside BOS)   End of Session OT - End of Session Activity Tolerance: Patient limited by fatigue Patient left: in chair;with call bell/phone within reach;with nursing in room Nurse Communication: Mobility status;Need for lift equipment  GO     Galen Manila 07/23/2012, 1:00 PM

## 2012-07-23 NOTE — Evaluation (Addendum)
Clinical/Bedside Swallow Evaluation Patient Details  Name: MANIYAH MOLLER MRN: [id number] Date of Birth: [birth year]  Today's Date: 07/23/2012 Time: 4098-1191 SLP Time Calculation (min): 14 min  Past Medical History:  Past Medical History  Diagnosis Date  . Hyperthyroidism     had RAI-is on thyroid replacement, happened maybe this summer  . Coronary artery disease     Had a catheterization per son by Dr. [name] in 1991 no reports in  [place]  . GERD (gastroesophageal reflux disease)    Past Surgical History:  Past Surgical History  Procedure Laterality Date  . None     HPI:  79 yo female adm to [facility] 07/21/12 with AMS.  Pt CT Head revealed a large nonhemmorhagic CVA in Left MCA region, old occipital lobe CVA new since 11/09 and atrophy.  Pt also diagnosed with left lobe pna and had periods of apnea in the middle of the night - now is on ventimask at 9 liters for oxygen support.  Order received for speech and swallow evaluations.     Assessment / Plan / Recommendation Clinical Impression  Pt presents with severe oral dysphagia from MCA CVA and suspected pharyngeal deficits.   Pt alert although does not follow commands.  Poor attention and awareness to two individual tsps of nectar thick juice noted.  Pt did not seal lips on spoon, nor orally manipulate bolus.  Anterior oral spillage noted with no pharyngeal swallow observed- SLP orally suctioned residual juice from oral cavity.   Further boluses not attempted secondary to level of deficits.   Recommend pt continue npo with adequate oral care and SLP to follow for readiness for po.  Concern is present for pt to recover functional swallow abiilty in acute setting given level of current deficits.      Aspiration Risk  Severe    Diet Recommendation NPO        Other  Recommendations Oral Care Recommendations: Oral care QID   Follow Up Recommendations  Inpatient Rehab;Skilled Nursing facility (vs - TBD)    Frequency and  Duration min 2x/week  2 weeks   Pertinent Vitals/Pain Afebrile, decreased, pt not follow commands    SLP Swallow Goals Goal #3: Pt will accept and swallow tsp of nectar juice within 10-15 seconds to aid in determining readiness for po with total cues.   Goal #4: Pt's family will verbalize aspiration precautions with moderate assistance to mitigate aspiration risk.     Swallow Study Prior Functional Status  Cognitive/Linguistic Baseline: Baseline deficits Baseline deficit details: per chart pt has baseline dementiaunknown, h/o dementia    General Date of Onset: 07/23/12 HPI: 79 yo female adm to [facility] 07/21/12 with AMS.  Pt CT Head revealed a large nonhemmorhagic CVA in Left MCA region, old occipital lobe CVA new since 11/09 and atrophy.  Pt also diagnosed with left lobe pna and had periods of apnea in the middle of the night - now is on ventimask at 9 liters for oxygen support.  Order received for speech and swallow evaluations.   Type of Study: Bedside swallow evaluation Diet Prior to this Study: NPO Temperature Spikes Noted: No Respiratory Status: Supplemental O2 delivered via (comment) History of Recent Intubation: No Behavior/Cognition: Alert;Doesn't follow directions;Decreased sustained attention;Distractible Oral Cavity - Dentition: Adequate natural dentition Self-Feeding Abilities: Total assist Patient Positioning: Upright in bed Baseline Vocal Quality:  (pt occasionally moans) Volitional Cough: Cognitively unable to elicit Volitional Swallow: Unable to elicit    Oral/Motor/Sensory Function Overall Oral Motor/Sensory Function: Impaired (  right labial asymmetry, pt not follow commands, grossly weak) Facial ROM: Reduced right Facial Symmetry: Right droop Facial Strength: Reduced Velum:  (UTA) Mandible:  (UTA)   Ice Chips Ice chips: Not tested   Thin Liquid Thin Liquid: Not tested    Nectar Thick Nectar Thick Liquid: Impaired Presentation: Spoon Oral Phase Impairments:  Reduced labial seal;Poor awareness of bolus;Reduced lingual movement/coordination;Impaired anterior to posterior transit Pharyngeal Phase Impairments:  (no pharyngeal swallow noted) Other Comments: slp suctioned bolus from pt's oral cavity after she demonstrated poor awareness and did not attempt to orally manipulate   Honey Thick Honey Thick Liquid: Not tested   Puree Puree: Not tested   Solid   GO    Solid: Not tested       Donavan Burnet, MS [facility] SLP [phone]

## 2012-07-23 NOTE — Progress Notes (Addendum)
When sleeping pt was having frequent episodes (approx. 1X/min) of apnea and desaturation into the low 80s.  Sats immediately returned to normal upon awakening.  Respiratory notified, Pt placed on Venti mask at 35% O2, Atrovent neb treatment given; sats now sustaining in the high 90's.  Will continue to monitor.

## 2012-07-23 NOTE — Plan of Care (Signed)
Problem: Phase II Progression Outcomes Goal: Discharge plan established Recommend SNF for further therapy needs after acute care d/c     

## 2012-07-24 ENCOUNTER — Inpatient Hospital Stay (HOSPITAL_COMMUNITY): Payer: Medicare Other

## 2012-07-24 LAB — GLUCOSE, CAPILLARY
Glucose-Capillary: 112 mg/dL — ABNORMAL HIGH (ref 70–99)
Glucose-Capillary: 121 mg/dL — ABNORMAL HIGH (ref 70–99)
Glucose-Capillary: 101 mg/dL — ABNORMAL HIGH (ref 70–99)

## 2012-07-24 LAB — BASIC METABOLIC PANEL
CO2: 31 mEq/L (ref 19–32)
Chloride: 102 mEq/L (ref 96–112)
Creatinine, Ser: 0.89 mg/dL (ref 0.50–1.10)
Glucose, Bld: 110 mg/dL — ABNORMAL HIGH (ref 70–99)

## 2012-07-24 MED ORDER — LEVOTHYROXINE SODIUM 100 MCG IV SOLR
37.5000 ug | Freq: Every day | INTRAVENOUS | Status: DC
  Administered 2012-07-24 – 2012-08-03 (×10): 37.5 ug via INTRAVENOUS
  Filled 2012-07-24 (×13): qty 5

## 2012-07-24 NOTE — Progress Notes (Signed)
Called by Dr. Isidoro Donning - She had met with son at bedside. I then met with son, [name].  Patient lived alone PTA. Son lives close to her and checks on her every day. She had just received word that her house would be condemned. She had the form clenched in her hand when he found her down with her stroke.  Patient had been independent with ADLs. He ensured she took her medications and paid her bills. He picked her up daily and they ran errands, visiting [employer] and the grocery store. [name] is currently unemployed. He shared a hx of anxiety, depression, PTSD, and other medical issues that limit him.  His cell# [phone]. He and she both speak fluent Spanish. At his request, I adjusted his voicemail to a standard greeting in case we needed to contact him again via telephone.  We discussed diagnosis, prognosis and plan of care - disability, risk for another stroke, unable to take care of herself. He feels he would not be able to provide care for her at discharge. While her first choice would not be long-term SNF placement, he does feel she would want a feeding tube and SNF placement for therapy. He would like placement in [place] as [place] or [place] due to anxiety. He resides near [facility] and would like her placement to be near there. He understands a SW will be contacting him.  [name], MSN, RN, ANVP-BC, ANP-BC, [facility] Pager: [phone] 07/24/2012 2:47 PM

## 2012-07-24 NOTE — Progress Notes (Signed)
NG tube inserted in R nare . Patient's son [name] at bedside at the time of insertion encouraging mother in native language.

## 2012-07-24 NOTE — Progress Notes (Signed)
Follow-up  Epic notes regarding events of yesterday reviewed; Palliative Medicine Team will sign off at this time as son indicated to staff he does not want to talk with PMT; Please re-consult PMT should family re-consider this option. Thank you   Valente David, RN 07/24/2012, 9:13 AM Palliative Medicine Team RN Liaison [phone]

## 2012-07-24 NOTE — Progress Notes (Signed)
SLP reviewed and agree with student findings.   Duane Earnshaw MA, CCC-SLP [phone]    

## 2012-07-24 NOTE — Progress Notes (Signed)
Speech Language Pathology Dysphagia Treatment Patient Details Name: Jenny Brady MRN: [id number] DOB: [birth year] Today's Date: 07/24/2012 Time: 4098-1191 SLP Time Calculation (min): 20 min  Assessment / Plan / Recommendation Clinical Impression  Treatment focused on po trials for possible diet advancement. Pt with extreme lethargy and decerased LOA, requiring total SLP cues for oral care with suctioning, in which pt was unable to follow commands for full participation. SLP provided ice chips for oral phase stimulation, resulting in no lip seal or attempt to contain/manipulate bolus, resulting in anterior loss. Recommend pt continue NPO with oral care and SLP will f/u at bedside to reassess for po readiness while in acute setting.    Diet Recommendation  Continue with Current Diet: NPO    SLP Plan Continue with current plan of care   Pertinent Vitals/Pain None reported   Swallowing Goals  SLP Swallowing Goals Goal #3: Pt will accept ice chips within 10-15 seconds to aid in determining readiness for po with total cues.   Swallow Study Goal #3 - Progress: Revised (modified due to lack of progress/goal met) Swallow Study Goal #4 - Progress: Other (comment) (No family present)  General Temperature Spikes Noted: No Respiratory Status: Supplemental O2 delivered via (comment) Behavior/Cognition: Lethargic;Decreased sustained attention;Requires cueing Oral Cavity - Dentition: Adequate natural dentition Patient Positioning: Upright in bed  Oral Cavity - Oral Hygiene     Dysphagia Treatment Treatment focused on: Facilitation of oral phase;Facilitation of oral preparatory phase Treatment Methods/Modalities: Skilled observation;Differential diagnosis Patient observed directly with PO's: Yes Type of PO's observed: Ice chips Feeding: Total assist Oral Phase Signs & Symptoms: Anterior loss/spillage Type of cueing: Verbal;Tactile;Visual Amount of cueing: Total   GO   Berdine Dance SLP  student  Berdine Dance 07/24/2012, 2:12 PM

## 2012-07-24 NOTE — Progress Notes (Signed)
Physical Therapy Treatment Patient Details Name: Jenny Brady MRN: [id number] DOB: Dec 02, 1925 Today's Date: 07/24/2012 Time: 1191-4782 PT Time Calculation (min): 32 min  PT Assessment / Plan / Recommendation Comments on Treatment Session  Pt very limited by pain today, crying out to the touch of LE's hypersensitive reponse. Present to a lesser extent to touch of trunk, neck, UE's. Pt able to maintain sitting EOB with min/ min-guard A but participating only minimally with tasks. Will need lift to get OOB today. Strongly recommend SNF placement, PT will continue to follow.    Follow Up Recommendations  SNF     Does the patient have the potential to tolerate intense rehabilitation     Barriers to Discharge        Equipment Recommendations  None recommended by PT    Recommendations for Other Services    Frequency Min 3X/week   Plan Frequency needs to be updated;Discharge plan needs to be updated    Precautions / Restrictions Precautions Precautions: Fall Precaution Comments: right neglect, left fixed gaze, wears L mitt Restrictions Weight Bearing Restrictions: No   Pertinent Vitals/Pain Crying out in pain to touch of LE's the most, rest of body to a lesser extent, NSG notified    Mobility  Bed Mobility Bed Mobility: Rolling Right;Supine to Sit;Sitting - Scoot to [place] of Bed;Sit to Supine Rolling Right: Other (comment) (attempted but pt crying out so put HOB up and pivoted) Supine to Sit: 1: +2 Total assist;HOB elevated Supine to Sit: Patient Percentage: 0% Sitting - Scoot to Edge of Bed: 1: +1 Total assist Sit to Supine: 1: +2 Total assist;HOB flat Sit to Supine: Patient Percentage: 20% Details for Bed Mobility Assistance: Pt resisting pivot to sitting but once to EOB and hips out so that feet could reach floor, she returned to baseline moaning and seemed more comfortable. The only verbalization that I understood was an affirmative sounds when asked if she wanted to lay  back down. Pt leaning to right to lay down and transition to supine seemed less painful. Transfers Transfers: Not assessed (unable due to pain, rec lift transfer to nsg) Ambulation/Gait Ambulation/Gait Assistance: Not tested (comment) Stairs: No Wheelchair Mobility Wheelchair Mobility: No Modified Rankin (Stroke Patients Only) Modified Rankin: Severe disability    Exercises General Exercises - Upper Extremity Shoulder Flexion: PROM;Right;10 reps Shoulder Extension: PROM;Right;10 reps Shoulder ABduction: PROM;Right;10 reps Shoulder ADduction: PROM;Right;10 reps Shoulder Horizontal ABduction: PROM;Right;10 reps Elbow Flexion: PROM;Right;10 reps Elbow Extension: PROM;Right;10 reps Wrist Flexion: PROM;Right;10 reps Digit Composite Flexion: PROM;Right;10 reps   PT Diagnosis:    PT Problem List:   PT Treatment Interventions:     PT Goals Acute Rehab PT Goals PT Goal Formulation: Patient unable to participate in goal setting Time For Goal Achievement: 07/29/12 Potential to Achieve Goals: Fair Pt will Roll Supine to Right Side: with mod assist PT Goal: Rolling Supine to Right Side - Progress: Not progressing Pt will Roll Supine to Left Side: with mod assist PT Goal: Rolling Supine to Left Side - Progress: Not progressing Pt will go Supine/Side to Sit: with mod assist PT Goal: Supine/Side to Sit - Progress: Not progressing Pt will Sit at Edge of Bed: with supervision;3-5 min;with no upper extremity support PT Goal: Sit at Edge Of Bed - Progress: Progressing toward goal Pt will go Sit to Supine/Side: with mod assist PT Goal: Sit to Supine/Side - Progress: Progressing toward goal Pt will go Sit to Stand: with mod assist PT Goal: Sit to Stand - Progress: Not  progressing Pt will go Stand to Sit: with mod assist PT Goal: Stand to Sit - Progress: Not progressing Pt will Transfer Bed to Chair/Chair to Bed: with mod assist PT Transfer Goal: Bed to Chair/Chair to Bed - Progress: Not  progressing Pt will Stand: with mod assist;1 - 2 min;with bilateral upper extremity support PT Goal: Stand - Progress: Not progressing Pt will Ambulate: 1 - 15 feet;with mod assist;with least restrictive assistive device PT Goal: Ambulate - Progress: Not progressing Additional Goals Additional Goal #1: Pt will follow one step commands 50% of 20 minute PT session. PT Goal: Additional Goal #1 - Progress: Progressing toward goal  Visit Information  Last PT Received On: 07/24/12 Assistance Needed: +2 PT/OT Co-Evaluation/Treatment: Yes    Subjective Data  Subjective: screaming out with touching anywhere on body but especially legs, moaning once settled back down Patient Stated Goal: unable to state   Cognition  Cognition Arousal/Alertness: Lethargic Behavior During Therapy: Flat affect (agitated with mvmt) Overall Cognitive Status: Difficult to assess Area of Impairment: Attention;Following commands Orientation Level: Disoriented to;Person;Place;Time;Situation Current Attention Level: Focused Following Commands: Follows one step commands inconsistently;Follows one step commands with increased time General Comments: When commanded to reach forward for therapist's hand, pt seemed as if attempting to initate but unable to fully initate and perform task. Inconsistently responding to simple one step commands (when asked to bend L knee, pt responeded by pumping left ankle). Difficult to assess due to: Impaired communication    Balance  Balance Balance Assessed: Yes Static Sitting Balance Static Sitting - Balance Support: Feet supported;No upper extremity supported Static Sitting - Level of Assistance: 4: Min assist Static Sitting - Comment/# of Minutes: pt sat EOB ~ 10 mins. Initially needing min A of 25% to maintain sitting but once scooted to EOB, was able to sit with min A of 10% and occasional min-guard. Occasionally leaning to right to try to lay back down. Attempted to wt-shift with  push through left hand on bed to scoot left hip back but unsuccessful. Attempted to get her to perform active mvmt while EOB but pt not following commands for knee ext, leg mvmt, etc. Was able to tap right foot on floor.  End of Session PT - End of Session Equipment Utilized During Treatment: Oxygen Activity Tolerance: Patient limited by pain;Treatment limited secondary to agitation Patient left: in bed;with call bell/phone within reach;with bed alarm set Nurse Communication: Need for lift equipment;Mobility status   GP    Lyanne Co, PT  Acute Rehab Services  [phone]  Lyanne Co 07/24/2012, 10:11 AM

## 2012-07-24 NOTE — Progress Notes (Signed)
Pt had 5 beats vtach at 1005, pt sleeping soundly, no distress noted, on 4L O2, sats 99%. Dr Isidoro Donning notified.

## 2012-07-24 NOTE — Progress Notes (Signed)
TCD completed. 

## 2012-07-24 NOTE — Progress Notes (Signed)
Stroke Team Progress Note  HISTORY Jenny Brady is an 79 y.o. female with a past medical history significant for dementia, CAD, CHF, hyperthyroidism, transferred from [facility] for further management of a large left MCA distribution infarct. Patient is not able to provide information and thus all clinical data is gather from patient's medical record. She appaently lives by herself and today 07/21/2012 she was found by her son unresponsive with some respiratory distress and was taken to [facility] where she was found to be poorly responsive and unable to move the right side. An emergent CT brain revealed a large area of decreased attenuation in the left MCA distribution suggestive of an ischemic infarct. Transferred to [facility] for further management. Patient was not a TPA candidate secondary to unknown time of symptom onset.  SUBJECTIVE Son came yesterday, refused palliative care. Left phone number that does not seem to reach him.  OBJECTIVE Most recent Vital Signs: Filed Vitals:   07/23/12 2125 07/23/12 2200 07/24/12 0200 07/24/12 0600  BP:  129/70 134/69 130/65  Pulse:  86 81 71  Temp:  98 F (36.7 C) 97.7 F (36.5 C) 99.1 F (37.3 C)  TempSrc:  Axillary Axillary Axillary  Resp:  20 20 20   Height:      Weight:      SpO2: 97% 98% 97% 99%   CBG (last 3)   Recent Labs  07/23/12 2035 07/23/12 2352 07/24/12 0346  GLUCAP 117* 112* 113*    IV Fluid Intake:     MEDICATIONS  . aspirin  300 mg Rectal Daily   Or  . aspirin  325 mg Oral Daily  . ipratropium  0.5 mg Nebulization Q6H  . levofloxacin (LEVAQUIN) IV  750 mg Intravenous Q24H  . LORazepam  1 mg Intravenous Once  . metoprolol  2.5 mg Intravenous Q12H  . pantoprazole (PROTONIX) IV  40 mg Intravenous QHS  . sodium chloride  3 mL Intravenous Q12H   PRN:  sodium chloride, acetaminophen, acetaminophen, albuterol, ondansetron (ZOFRAN) IV, sodium chloride  Diet:  NPO  Activity:  OOB with assistance DVT Prophylaxis:  SCDs   CLINICALLY  SIGNIFICANT STUDIES Basic Metabolic Panel:   Recent Labs Lab 07/23/12 0510 07/24/12 0455  NA 138 139  K 4.0 4.2  CL 100 102  CO2 30 31  GLUCOSE 125* 110*  BUN 20 18  CREATININE 0.98 0.89  CALCIUM 8.4 8.3*   Liver Function Tests:   Recent Labs Lab 07/21/12 1635  AST 16  ALT 12  ALKPHOS 107  BILITOT 0.6  PROT 6.4  ALBUMIN 2.7*   CBC:   Recent Labs Lab 07/21/12 1635 07/21/12 1732  WBC 6.3  --   NEUTROABS 4.4  --   HGB 10.7* 12.2  HCT 35.2* 36.0  MCV 77.7*  --   PLT 308  --    Coagulation: No results found for this basename: LABPROT, INR,  in the last 168 hours Cardiac Enzymes:   Recent Labs Lab 07/22/12 0032 07/22/12 0530 07/22/12 1207  TROPONINI <0.30 <0.30 <0.30   Urinalysis:   Recent Labs Lab 07/21/12 1734  COLORURINE YELLOW  LABSPEC 1.015  PHURINE 7.5  GLUCOSEU NEGATIVE  HGBUR NEGATIVE  BILIRUBINUR NEGATIVE  KETONESUR NEGATIVE  PROTEINUR 30*  UROBILINOGEN 1.0  NITRITE NEGATIVE  LEUKOCYTESUR SMALL*   Lipid Panel    Component Value Date/Time   CHOL 136 07/22/2012 0032   TRIG 93 07/22/2012 0032   HDL 27* 07/22/2012 0032   CHOLHDL 5.0 07/22/2012 0032   VLDL 19  07/22/2012 0032   LDLCALC 90 07/22/2012 0032   HgbA1C  Lab Results  Component Value Date   HGBA1C 7.3* 07/22/2012   Urine Drug Screen:   No results found for this basename: labopia,  cocainscrnur,  labbenz,  amphetmu,  thcu,  labbarb    Alcohol Level: No results found for this basename: ETH,  in the last 168 hours  CT of the brain  07/21/2012   1.  Large nonhemorrhagic left middle cerebral artery infarct with early edema without midline shift. 2.  Old left occipital infarct. 3.  Atrophy, most prominent in the temporal lobes.   MRI of the brain  07/22/2012  Motion degraded exam.  Large left middle cerebral artery distribution infarct as noted above.  Thrombus within the left middle cerebral artery suspected with occluded branches distal to this region.    MRA of the brain    2D  Echocardiogram  EF 15% with no source of embolus.   Carotid Doppler  No evidence of hemodynamically significant internal carotid artery stenosis. Vertebral artery flow is antegrade.   CXR   07/22/2012    1.  Persistent pulmonary edema. 2.  No change in aeration to the left lower lobe.  07/21/2012  1.  Cardiomegaly and pulmonary edema. 2.  Small left pleural effusion and basilar airspace disease which could be due to atelectasis or pneumonia.   EKG  normal sinus rhythm, LBBB.   Therapy Recommendations CIR, CIR recommends SNF  Physical Exam   The patient is alert, non-verbal. The patient has a pain response with passive movement of any extremity.  Obese. 1 to 2 plus edema at the ankles.  Left gaze preference, and no blink to threat from the right. Cannot doll the eyes across midline.  Right hemiparesis, arm> leg, severe. Will move the left side. The patient is mute, but will follow some verbal commands.  Could not ambulate, and the patient will not follow commands for cerebellar testing.  DTR are depressed, symmetric, right babinski.   ASSESSMENT [name] is a 79 y.o. demented female presenting with unresponsiveness and respiratory distress after being found down at home. Imaging confirms a left large MCA infarct. Infarct felt to be embolic secondary to unknown etiology (most common etiology in this age group is atrial fibrillation).  On aspirin 81 mg orally every day prior to admission. Now on aspirin 325 mg orally every day for secondary stroke prevention. Patient with resultant right hemiparesis, global aphasia, dysphagia. Work up underway. More alert today.   CAD  LDL 90  HgbA1c 7.3  Severe dysphagia, failed swallow eval  Morbid obesity  Baseline dementia  Community acquired pneumonia, on levaquin  UTI, on levaquin  Cardiomyopathy with EF 15%  Likely obstructive sleep apnea  The EF is in a range where anticoagulation should be considered. The TEE will  help determine if an intraventricular clot is present. The patient is NPO, and she may require a PEG tube in the near future. The patient cannot be anticoagulated until this decision is made. ST is following.  Hospital day # 3  TREATMENT/PLAN  Continue aspirin suppository for secondary stroke prevention. Consider anticoagulation as above.  Repeat swallow eval  F/u  MRA. Check TCD TEE today to look for embolic source. Arranged with [facility].  If positive for PFO (patent foramen ovale), check bilateral lower extremity venous dopplers to rule out DVT as possible source of stroke. SNF at discharge  [facility] BIBY, MSN, RN, ANVP-BC, ANP-BC, GNP-BC [place] Pager: [phone] 07/24/2012 10:07 AM  I have personally obtained a history, examined the patient, evaluated imaging results, and formulated the assessment and plan of care. I agree with the above.  [name]

## 2012-07-24 NOTE — Progress Notes (Signed)
Patient ID: Jenny Brady  female  ZOX:[id number]    DOB: [birth year]    DOA: 07/21/2012  PCP: [name], MD  Assessment/Plan: Principal Problem:   Acute ischemic large left MCA stroke: Patient seen earlier today, mental status improving, following some verbal commands - CT head was obtained which showed large left MCA distribution infarct, most likely embolic. Not a candidate for thrombolytics due to unknown time of onset, delayed presentation. - Continue aspirin PR until taking oral - MRI of the brain showed large left hemispheric middle cerebral artery distribution infarct involving left temporal lobe, left frontal lobe and left opercular/sub insular region, involvement of the left lenticular nucleus/left corona radiata with local mass effect. Thrombus within the left middle cerebral artery suspected with occluded branches distal to this region. - PT, OT, recommending skilled nursing facility - 2-D echo showed EF of 15%, severe tricuspid regurgitation, moderate PHTN, PAP 62, moderate MR -  carotid Dopplers showed no ICA stenosis, antegrade vertebral flow - TEE pending, if positive for PFO, will check bilateral lower extremity Dopplers to rule out DVT - Failed swallow eval, will place nasogastric feeding tube, follow serial swallow evaluations.  Active Problems:   DEMENTIA-mod-severe: Unknown baseline however patient is alert but not following any commands with new large left MCA stroke    Hyperthyroidism-s/p [facility] Ratterman admin: Now hypothyroidism, TSH 13.9 - Patient was on Synthroid daily, would increase 75 mcg, but change to IV (NPO status, 1/2 dose)     Coronary artery disease with acute on chronic systolic CHF (EF was 25-30% in 2012) - 2-D echo shows EF of 15% , BP currently borderline low - Will hold her Lasix to avoid hypotension in view of acute CVA    Dyspnea multifactorial likely due to acute on chronic systolic CHF and left lung pneumonia     CAP (community acquired  pneumonia)- left lung -Currently on IV Levaquin, nebs     UTI (lower urinary tract infection):  - Urine culture showed no growth  DVT Prophylaxis: SCD's  Code Status:  Full code  Disposition: Discussed with patient's son, Skylin Kennerson today in detail at the bedside.  Goals of care addressed with the patient's son by myself, all questions and concerns answered 1) he wants her to be FULL CODE STATUS and continue aggressive care "to keep her alive and going" 2) okay for artificial feeding temporarily but no decision regarding permanent feeding   Subjective: Alert and following some verbal commands, right-sided hemiparesis  Objective: Weight change:   Intake/Output Summary (Last 24 hours) at 07/24/12 1435 Last data filed at 07/24/12 0100  Gross per 24 hour  Intake      0 ml  Output   2350 ml  Net  -2350 ml   Blood pressure 102/55, pulse 71, temperature 98.2 F (36.8 C), temperature source Oral, resp. rate 18, height 5\' 6"  (1.676 m), weight 85.548 kg (188 lb 9.6 oz), SpO2 99.00%.  Physical Exam: General: Alert and awake, nonverbal but nods "yes and no", responding to some verbal commands CVS: S1-S2 clear Chest: clear to auscultation anteriorly Abdomen: obese, soft, NT, ND, NBS Extremities: no c/c/e Neuro: Was able to wiggle her toes on the left, right-sided hemiparesis  Lab Results: Basic Metabolic Panel:  Recent Labs Lab 07/23/12 0510 07/24/12 0455  NA 138 139  K 4.0 4.2  CL 100 102  CO2 30 31  GLUCOSE 125* 110*  BUN 20 18  CREATININE 0.98 0.89  CALCIUM 8.4 8.3*   Liver Function  Tests:  Recent Labs Lab 07/21/12 1635  AST 16  ALT 12  ALKPHOS 107  BILITOT 0.6  PROT 6.4  ALBUMIN 2.7*   CBC:  Recent Labs Lab 07/21/12 1635 07/21/12 1732  WBC 6.3  --   NEUTROABS 4.4  --   HGB 10.7* 12.2  HCT 35.2* 36.0  MCV 77.7*  --   PLT 308  --    Cardiac Enzymes:  Recent Labs Lab 07/22/12 0032 07/22/12 0530 07/22/12 1207  TROPONINI <0.30 <0.30 <0.30    BNP: No components found with this basename: POCBNP,  CBG:  Recent Labs Lab 07/23/12 2035 07/23/12 2352 07/24/12 0346 07/24/12 1011 07/24/12 1147  GLUCAP 117* 112* 113* 110* 121*     Micro Results: Recent Results (from the past 240 hour(s))  URINE CULTURE     Status: None   Collection Time    07/21/12  5:34 PM      Result Value Range Status   Specimen Description URINE, CATHETERIZED   Final   Special Requests NONE   Final   Culture  Setup Time 07/21/2012 21:22   Final   Colony Count NO GROWTH   Final   Culture NO GROWTH   Final   Report Status 07/22/2012 FINAL   Final    Studies/Results: Dg Chest 1 View  07/21/2012  *RADIOLOGY REPORT*  Clinical Data: Shortness of breath and wheezing.  Altered mental status.  CHEST - 1 VIEW  Comparison: PA and lateral chest 07/11/2011.  Findings: There is cardiomegaly and pulmonary edema.  Small left effusion and basilar airspace disease are also identified.  IMPRESSION:  1.  Cardiomegaly and pulmonary edema. 2.  Small left pleural effusion and basilar airspace disease which could be due to atelectasis or pneumonia.   Original Report Authenticated By: Holley Dexter, M.D.    Dg Chest 2 View  07/22/2012  *RADIOLOGY REPORT*  Clinical Data: Evaluate for infiltrate  CHEST - 2 VIEW  Comparison: 07/21/2012  Findings: Moderate cardiac enlargement.  There is atelectasis and airspace consolidation noted within the left lower lobe.  Mild diffuse interstitial edema is noted.  No new findings identified.  IMPRESSION:  1.  Persistent pulmonary edema. 2.  No change in aeration to the left lower lobe.   Original Report Authenticated By: Signa Kell, M.D.    Ct Head Wo Contrast  07/21/2012  *RADIOLOGY REPORT*  Clinical Data: Altered mental status.  The patient is unresponsive.  CT HEAD WITHOUT CONTRAST  Technique:  Contiguous axial images were obtained from the base of the skull through the vertex without contrast.  Comparison: 02/09/2008  Findings: The  patient has an acute large non hemorrhagic left middle cerebral artery distribution infarct with slight edema.  No midline shift at this time. There is a hyper dense branch of the left middle cerebral artery seen on image number 11 of series 2, consistent with acute thrombosis.  There is an old left occipital infarct, new since 02/09/2008.  Diffuse cerebral cortical atrophy, most prominent in the temporal lobes.  No significant osseous abnormality.  IMPRESSION:  1.  Large nonhemorrhagic left middle cerebral artery infarct with early edema without midline shift. 2.  Old left occipital infarct. 3.  Atrophy, most prominent in the temporal lobes.   Original Report Authenticated By: Francene Boyers, M.D.    Mr Brain Wo Contrast  07/22/2012  *RADIOLOGY REPORT*  Clinical Data: Altered mental status.  Abnormal CT.  MRI HEAD WITHOUT CONTRAST  Technique:  Multiplanar, multiecho pulse sequences of the brain and  surrounding structures were obtained according to standard protocol without intravenous contrast.  Comparison: 07/21/2012 CT.  03/08/2004 MR.  Findings: Motion degraded exam.  MR angiogram was ordered although the patient not able to cooperate for this exam.  Large left hemispheric middle cerebral artery distribution infarct involving left temporal lobe, left frontal lobe and left opercular/sub insular region.  Involvement of the left lenticular nucleus/left corona radiata.  No intracranial hemorrhage.  Local mass effect without midline shift.  Remote left occipital lobe infarct with encephalomalacia.  Mild small vessel disease type changes.  Global atrophy without hydrocephalus.  No intracranial mass lesion detected on this unenhanced exam.  Limited evaluation of the intracranial vascular structures secondary to the motion.  Thrombus within the left middle cerebral artery suspected with occluded branches distal to this region.  IMPRESSION: Motion degraded exam.  Large left middle cerebral artery distribution infarct  as noted above.  Thrombus within the left middle cerebral artery suspected with occluded branches distal to this region.  This has been made a PRA call report utilizing dashboard call feature.   Original Report Authenticated By: Lacy Duverney, M.D.     Medications: Scheduled Meds: . aspirin  300 mg Rectal Daily   Or  . aspirin  325 mg Oral Daily  . ipratropium  0.5 mg Nebulization Q6H  . levofloxacin (LEVAQUIN) IV  750 mg Intravenous Q24H  . levothyroxine  37.5 mcg Intravenous QAC breakfast  . LORazepam  1 mg Intravenous Once  . metoprolol  2.5 mg Intravenous Q12H  . pantoprazole (PROTONIX) IV  40 mg Intravenous QHS  . sodium chloride  3 mL Intravenous Q12H      LOS: 3 days   Kalin Kyler M.D. [facility] 07/24/2012, 2:35 PM Pager: [phone]  If 7PM-7AM, please contact night-coverage [email or web address] Password [email or web address]

## 2012-07-24 NOTE — Progress Notes (Signed)
Occupational Therapy Treatment Patient Details Name: Jenny Brady MRN: [id number] DOB: Oct 19, 1925 Today's Date: 07/24/2012 Time: 9528-4132 OT Time Calculation (min): 34 min  OT Assessment / Plan / Recommendation Comments on Treatment Session Pt experiencing significant pain today during session in trunk, UEs and LEs.  Pt appeared hypersensitive to touch, especially to LEs and would withdraw and scream in pain when therapist would touch LE.  Pain onset more intense during bed mobility and pt screamed througout bed mobility task.  RN made aware.  Pt returned to bed at end of session.    Follow Up Recommendations  SNF    Barriers to Discharge       Equipment Recommendations       Recommendations for Other Services    Frequency Min 3X/week   Plan Discharge plan remains appropriate    Precautions / Restrictions Precautions Precautions: Fall Precaution Comments: right neglect, left fixed gaze, wears R mitt   Pertinent Vitals/Pain See vitals    ADL  Eating/Feeding: NPO Grooming: Performed;Wash/dry face;Maximal assistance Where Assessed - Grooming: Supported sitting Transfers/Ambulation Related to ADLs: Transfer not attempted due to pt screaming out in pain during movement in LEs and trunk.   ADL Comments: Pt sat EOB ~10 min and tolerated RUE PROM.  Hand over hand assist to LUE to wash face but pt resistant to task.       OT Diagnosis:    OT Problem List:   OT Treatment Interventions:     OT Goals ADL Goals Pt Will Perform Grooming: with min assist;Sitting, edge of bed;Supported;with cueing (comment type and amount) ADL Goal: Grooming - Progress: Progressing toward goals Additional ADL Goal #1: Pt will attend to R side to perform grooming and UB bathing tasks with mod verbal/physical cues to initiate and complete while seated with support at EOB ADL Goal: Additional Goal #1 - Progress: Progressing toward goals Arm Goals Pt Will Tolerate PROM: to decrease contracture;Right  upper extremity;3 sets;10 reps Arm Goal: PROM - Progress: Progressing toward goal  Visit Information  Last OT Received On: 07/24/12 Assistance Needed: +2 PT/OT Co-Evaluation/Treatment: Yes    Subjective Data      Prior Functioning       Cognition  Cognition Arousal/Alertness: Awake/alert Behavior During Therapy: Flat affect (agitated with movement) Overall Cognitive Status: Difficult to assess Area of Impairment: Attention;Following commands Current Attention Level: Focused Following Commands: Follows one step commands inconsistently;Follows one step commands with increased time General Comments: When commanded to reach forward for therapist's hand, pt seemed as if attempting to initate but unable to fully initate and perform task. Inconsistently responding to simple one step commands (when asked to bend L knee, pt responeded by pumping left ankle). Difficult to assess due to: Impaired communication    Mobility  Bed Mobility Bed Mobility: Supine to Sit;Sitting - Scoot to Edge of Bed;Sit to Supine Supine to Sit: 1: +2 Total assist;HOB elevated Supine to Sit: Patient Percentage: 0% Sitting - Scoot to Edge of Bed: 1: +1 Total assist Sit to Supine: 1: +2 Total assist;HOB flat Sit to Supine: Patient Percentage: 20% Details for Bed Mobility Assistance: Pt resistant to sitting up due to pain.  HOB elevated during supine<>sit for ease and to minimize pain during mobility.  Pt able to assist minimally with return to supine position with use of draw pad beneath hips to help rotate hips/trunk into bed.  Transfers Transfers: Not assessed    Exercises  General Exercises - Upper Extremity Shoulder Flexion: PROM;Right;10 reps Shoulder Extension: PROM;Right;10  reps Shoulder ABduction: PROM;Right;10 reps Shoulder ADduction: PROM;Right;10 reps Shoulder Horizontal ABduction: PROM;Right;10 reps Elbow Flexion: PROM;Right;10 reps Elbow Extension: PROM;Right;10 reps Wrist Flexion:  PROM;Right;10 reps Digit Composite Flexion: PROM;Right;10 reps   Balance Balance Balance Assessed: Yes Static Sitting Balance Static Sitting - Balance Support: No upper extremity supported Static Sitting - Level of Assistance: 4: Min assist Static Sitting - Comment/# of Minutes: Pt sat EOB ~10 min,  Iniitally pt requiring heavy min assist due to posterior lean (attempting to lay back down) but then improved to a light min assist and even min guard as time progressed.   End of Session OT - End of Session Activity Tolerance: Patient limited by pain Patient left: in bed;with call bell/phone within reach;with bed alarm set Nurse Communication:  (pain during mobility and touch)  GO    07/24/2012 Cipriano Mile OTR/L Pager [phone] Office [phone]  Cipriano Mile 07/24/2012, 9:16 AM

## 2012-07-25 DIAGNOSIS — I2510 Atherosclerotic heart disease of native coronary artery without angina pectoris: Secondary | ICD-10-CM

## 2012-07-25 LAB — GLUCOSE, CAPILLARY
Glucose-Capillary: 101 mg/dL — ABNORMAL HIGH (ref 70–99)
Glucose-Capillary: 124 mg/dL — ABNORMAL HIGH (ref 70–99)
Glucose-Capillary: 95 mg/dL (ref 70–99)

## 2012-07-25 LAB — BASIC METABOLIC PANEL
Chloride: 101 mEq/L (ref 96–112)
Creatinine, Ser: 0.79 mg/dL (ref 0.50–1.10)
GFR calc Af Amer: 86 mL/min — ABNORMAL LOW (ref >90)
Sodium: 135 mEq/L (ref 135–145)

## 2012-07-25 MED ORDER — JEVITY 1.2 CAL PO LIQD
1000.0000 mL | ORAL | Status: DC
  Administered 2012-07-25: 1000 mL
  Administered 2012-07-27: 10:00:00
  Administered 2012-07-28: 1000 mL
  Administered 2012-07-29: 02:00:00
  Administered 2012-07-30 – 2012-07-31 (×3): 1000 mL
  Administered 2012-08-07: 07:00:00
  Administered 2012-08-08: 1000 mL
  Administered 2012-08-09: 03:00:00
  Administered 2012-08-10 – 2012-08-13 (×2): 1000 mL
  Filled 2012-07-25 (×32): qty 1000

## 2012-07-25 MED ORDER — FREE WATER
150.0000 mL | Freq: Four times a day (QID) | Status: DC
  Administered 2012-07-25 – 2012-08-04 (×34): 150 mL

## 2012-07-25 MED ORDER — IPRATROPIUM BROMIDE 0.02 % IN SOLN
0.5000 mg | Freq: Two times a day (BID) | RESPIRATORY_TRACT | Status: DC
  Administered 2012-07-26 – 2012-07-28 (×4): 0.5 mg via RESPIRATORY_TRACT
  Filled 2012-07-25 (×4): qty 2.5

## 2012-07-25 NOTE — Progress Notes (Signed)
Speech Language Pathology Dysphagia Treatment Patient Details Name: Jenny Brady MRN: [id number] DOB: 1925-11-11 Today's Date: 07/25/2012 Time: 1350-1410 SLP Time Calculation (min): 20 min  Assessment / Plan / Recommendation Clinical Impression  Treatment focused on therapuetic po trials for possible diet advancement. Pt with increased LOA today with min-mod increase in attention to po's. Pt with improvements in bolus awareness/manipulation today, as observed with trials of puree, however pt requires max verbal, visual, and tactile cues from SLP to stimulate swallow response. SLP recommends NPO at this time, however given pt's increased LOA today, prognosis hopeful for diet advancement with therapeutic po trials while in acute setting, will f/u at next date.    Diet Recommendation  Continue with Current Diet: NPO    SLP Plan Continue with current plan of care   Pertinent Vitals/Pain None reported   Swallowing Goals  SLP Swallowing Goals Goal #3: Pt will accept ice chips within 10-15 seconds to aid in determining readiness for po with total cues.   Swallow Study Goal #3 - Progress: Progressing toward goal Swallow Study Goal #4 - Progress:  (no family present)  General Temperature Spikes Noted: No Respiratory Status: Supplemental O2 delivered via (comment) (nasal cannula) Behavior/Cognition: Alert;Requires cueing;Lethargic Oral Cavity - Dentition: Adequate natural dentition Patient Positioning: Upright in bed  Oral Cavity - Oral Hygiene     Dysphagia Treatment Treatment focused on: Facilitation of oral preparatory phase;Facilitation of oral phase;Upgraded PO texture trials Treatment Methods/Modalities: Skilled observation;Differential diagnosis Patient observed directly with PO's: Yes Type of PO's observed: Ice chips;Dysphagia 1 (puree);Thin liquids Feeding: Total assist Liquids provided via: Cup;Teaspoon Oral Phase Signs & Symptoms: Anterior loss/spillage;Prolonged bolus  formation Type of cueing: Verbal;Tactile;Visual Amount of cueing: Total   GO   Berdine Dance SLP student   Berdine Dance 07/25/2012, 4:15 PM

## 2012-07-25 NOTE — Progress Notes (Signed)
SLP reviewed and agree with student findings.   Lavita Pontius MA, CCC-SLP [phone]    

## 2012-07-25 NOTE — Progress Notes (Addendum)
Stroke Team Progress Note  HISTORY Jenny Brady is an 79 y.o. female with a past medical history significant for dementia, CAD, CHF, hyperthyroidism, transferred from [facility] for further management of a large left MCA distribution infarct. Patient is not able to provide information and thus all clinical data is gather from patient's medical record. She appaently lives by herself and today 07/21/2012 she was found by her son unresponsive with some respiratory distress and was taken to [facility] where she was found to be poorly responsive and unable to move the right side. An emergent CT brain revealed a large area of decreased attenuation in the left MCA distribution suggestive of an ischemic infarct. Transferred to [facility] for further management. Patient was not a TPA candidate secondary to unknown time of symptom onset.  SUBJECTIVE No family at bedside.  OBJECTIVE Most recent Vital Signs: Filed Vitals:   07/25/12 0805 07/25/12 0850 07/25/12 1026 07/25/12 1030  BP: 121/58  156/127 121/61  Pulse: 64  78 74  Temp: 97.3 F (36.3 C)   98 F (36.7 C)  TempSrc: Axillary   Axillary  Resp: 14   18  Height:      Weight:      SpO2: 98% 98%  99%   CBG (last 3)   Recent Labs  07/24/12 2040 07/25/12 0007 07/25/12 0415  GLUCAP 101* 95 102*   IV Fluid Intake:   . feeding supplement (JEVITY 1.2 CAL)     MEDICATIONS  . aspirin  300 mg Rectal Daily   Or  . aspirin  325 mg Oral Daily  . free water  150 mL Per Tube Q6H  . ipratropium  0.5 mg Nebulization Q6H  . levofloxacin (LEVAQUIN) IV  750 mg Intravenous Q24H  . levothyroxine  37.5 mcg Intravenous QAC breakfast  . metoprolol  2.5 mg Intravenous Q12H  . pantoprazole (PROTONIX) IV  40 mg Intravenous QHS  . sodium chloride  3 mL Intravenous Q12H   PRN:  sodium chloride, acetaminophen, acetaminophen, albuterol, ondansetron (ZOFRAN) IV, sodium chloride  Diet:  NPO  Activity:  OOB with assistance DVT Prophylaxis:  SCDs   CLINICALLY SIGNIFICANT  STUDIES Basic Metabolic Panel:   Recent Labs Lab 07/24/12 0455 07/25/12 0515  NA 139 135  K 4.2 4.0  CL 102 101  CO2 31 28  GLUCOSE 110* 105*  BUN 18 15  CREATININE 0.89 0.79  CALCIUM 8.3* 8.5   Liver Function Tests:   Recent Labs Lab 07/21/12 1635  AST 16  ALT 12  ALKPHOS 107  BILITOT 0.6  PROT 6.4  ALBUMIN 2.7*   CBC:   Recent Labs Lab 07/21/12 1635 07/21/12 1732  WBC 6.3  --   NEUTROABS 4.4  --   HGB 10.7* 12.2  HCT 35.2* 36.0  MCV 77.7*  --   PLT 308  --    Coagulation: No results found for this basename: LABPROT, INR,  in the last 168 hours Cardiac Enzymes:   Recent Labs Lab 07/22/12 0032 07/22/12 0530 07/22/12 1207  TROPONINI <0.30 <0.30 <0.30   Urinalysis:   Recent Labs Lab 07/21/12 1734  COLORURINE YELLOW  LABSPEC 1.015  PHURINE 7.5  GLUCOSEU NEGATIVE  HGBUR NEGATIVE  BILIRUBINUR NEGATIVE  KETONESUR NEGATIVE  PROTEINUR 30*  UROBILINOGEN 1.0  NITRITE NEGATIVE  LEUKOCYTESUR SMALL*   Lipid Panel    Component Value Date/Time   CHOL 136 07/22/2012 0032   TRIG 93 07/22/2012 0032   HDL 27* 07/22/2012 0032   CHOLHDL 5.0 07/22/2012 0032  VLDL 19 07/22/2012 0032   LDLCALC 90 07/22/2012 0032   HgbA1C  Lab Results  Component Value Date   HGBA1C 7.3* 07/22/2012   Urine Drug Screen:   No results found for this basename: labopia,  cocainscrnur,  labbenz,  amphetmu,  thcu,  labbarb    Alcohol Level: No results found for this basename: ETH,  in the last 168 hours  CT of the brain  07/21/2012   1.  Large nonhemorrhagic left middle cerebral artery infarct with early edema without midline shift. 2.  Old left occipital infarct. 3.  Atrophy, most prominent in the temporal lobes.   MRI of the brain  07/22/2012  Motion degraded exam.  Large left middle cerebral artery distribution infarct as noted above.  Thrombus within the left middle cerebral artery suspected with occluded branches distal to this region.    MRA of the brain    2D Echocardiogram   EF 15% with no source of embolus.   Carotid Doppler  No evidence of hemodynamically significant internal carotid artery stenosis. Vertebral artery flow is antegrade.   TEE   CXR   07/22/2012    1.  Persistent pulmonary edema. 2.  No change in aeration to the left lower lobe.  07/21/2012  1.  Cardiomegaly and pulmonary edema. 2.  Small left pleural effusion and basilar airspace disease which could be due to atelectasis or pneumonia.   EKG  normal sinus rhythm, LBBB.   Therapy Recommendations CIR, CIR recommends SNF  Physical Exam   The patient is alert, non-verbal. The patient has a pain response with passive movement of any extremity.  Obese. 1 to 2 plus edema at the ankles.  Left gaze preference, and no blink to threat from the right. Cannot doll the eyes across midline.  Right hemiparesis, arm> leg, severe. Will move the left side. The patient is mute, but will follow some verbal commands.  Could not ambulate, and the patient will not follow commands for cerebellar testing.  DTR are depressed, symmetric, right babinski.   ASSESSMENT [name] is a 79 y.o. demented female presenting with unresponsiveness and respiratory distress after being found down at home. Imaging confirms a left large MCA infarct. Infarct felt to be embolic secondary to unknown etiology (most common etiology in this age group is atrial fibrillation).  On aspirin 81 mg orally every day prior to admission. Now on aspirin 325 mg orally every day for secondary stroke prevention. Patient with resultant right hemiparesis, global aphasia, dysphagia. Work up completed.    CAD  LDL 90  HgbA1c 7.3  Severe dysphagia, failed swallow eval  Morbid obesity  Baseline dementia  Community acquired pneumonia, on levaquin  UTI, on levaquin  Cardiomyopathy with EF 15%  Likely obstructive sleep apnea  The EF is in a range where anticoagulation should be considered. The TEE will help determine if an  intraventricular clot is present. The patient is NPO, and she may require a PEG tube in the near future. The patient cannot be anticoagulated until this decision is made. ST is following.  Hospital day # 4  TREATMENT/PLAN  Continue aspirin suppository for secondary stroke prevention. Consider anticoagulation as above.  Repeat swallow eval over the weekend; plan for PEG Monday if she does not pass  F/u  TCD TEE April 18 to look for embolic source. rearanged with [facility].  If positive for PFO (patent foramen ovale), check bilateral lower extremity venous dopplers to rule out DVT as possible source of  stroke. SNF at discharge  [facility], MSN, RN, ANVP-BC, ANP-BC, [facility] Pager: [phone] 07/25/2012 11:28 AM  I have personally obtained a history, examined the patient, evaluated imaging results, and formulated the assessment and plan of care. I agree with the above.   [name]

## 2012-07-25 NOTE — Consult Note (Cosign Needed)
Jenny Brady 

## 2012-07-25 NOTE — Progress Notes (Signed)
Patient ID: Jenny Brady  female  ZOX:[id number]    DOB: Nov 15, 1925    DOA: 07/21/2012  PCP: Jenny Peck, MD  Assessment/Plan: Principal Problem:   Acute ischemic large left MCA stroke:  - CT head was obtained which showed large left MCA distribution infarct, most likely embolic. Not a candidate for thrombolytics due to unknown time of onset, delayed presentation. - Continue aspirin PR until taking oral - MRI of the brain showed large left hemispheric middle cerebral artery distribution infarct involving left temporal lobe, left frontal lobe and left opercular/sub insular region, involvement of the left lenticular nucleus/left corona radiata with local mass effect. Thrombus within the left middle cerebral artery suspected with occluded branches distal to this region. - PT, OT, recommending skilled nursing facility - 2-D echo showed EF of 15%, severe tricuspid regurgitation, moderate PHTN, PAP 62, moderate MR -  carotid Dopplers showed no ICA stenosis, antegrade vertebral flow - TEE pending, if positive for PFO, will check bilateral lower extremity Dopplers to rule out DVT - Failed swallow eval, placed nasogastric feeding tube, follow serial swallow evaluations, PEG tube if fails swallow evaluation, will need to address this with the son again as he did not make any formal decision about the PEG tube .  Active Problems:   DEMENTIA-mod-severe: Unknown baseline however patient is somewhat alert but not following any commands with new large left MCA stroke    Hyperthyroidism-s/p Jenny Brady admin: Now hypothyroidism, TSH 13.9 - Patient was on Synthroid daily, increased to 75 mcg, but change to IV (NPO status, 1/2 dose)     Coronary artery disease with acute on chronic systolic CHF (EF was 25-30% in 2012) - 2-D echo shows EF of 15% , BP currently borderline low - Will hold her Lasix to avoid hypotension in view of acute CVA    Dyspnea multifactorial likely due to acute on chronic  systolic CHF and left lung pneumonia     CAP (community acquired pneumonia)- left lung -Currently on IV Levaquin, nebs     UTI (lower urinary tract infection):  - Urine culture showed no growth  DVT Prophylaxis: SCD's  Code Status:  Full code  Disposition: Per patient's son, Jenny Brady  Goals of care  1) he wants her to be FULL CODE STATUS and continue aggressive care "to keep her alive and going" 2) okay for artificial feeding temporarily but no decision regarding permanent feeding   Subjective: Following some commands, somewhat more lethargic than yesterday, right-sided hemiparesis  Objective: Weight change:   Intake/Output Summary (Last 24 hours) at 07/25/12 1359 Last data filed at 07/25/12 1115  Gross per 24 hour  Intake      0 ml  Output   2305 ml  Net  -2305 ml   Blood pressure 121/61, pulse 74, temperature 98 F (36.7 C), temperature source Axillary, resp. rate 18, height 5\' 6"  (1.676 m), weight 85.548 kg (188 lb 9.6 oz), SpO2 99.00%.  Physical Exam: General: Somewhat lethargic, but arousable, moans, NGT+ CVS: S1-S2 clear Chest: CTAB Abdomen: obese, soft, NT, ND, NBS Extremities: no c/c/e Neuro: Was able to wiggle her toes on the left, right-sided hemiparesis  Lab Results: Basic Metabolic Panel:  Recent Labs Lab 07/24/12 0455 07/25/12 0515  NA 139 135  K 4.2 4.0  CL 102 101  CO2 31 28  GLUCOSE 110* 105*  BUN 18 15  CREATININE 0.89 0.79  CALCIUM 8.3* 8.5   Liver Function Tests:  Recent Labs Lab 07/21/12 1635  AST  16  ALT 12  ALKPHOS 107  BILITOT 0.6  PROT 6.4  ALBUMIN 2.7*   CBC:  Recent Labs Lab 07/21/12 1635 07/21/12 1732  WBC 6.3  --   NEUTROABS 4.4  --   HGB 10.7* 12.2  HCT 35.2* 36.0  MCV 77.7*  --   PLT 308  --    Cardiac Enzymes:  Recent Labs Lab 07/22/12 0032 07/22/12 0530 07/22/12 1207  TROPONINI <0.30 <0.30 <0.30   BNP: No components found with this basename: POCBNP,  CBG:  Recent Labs Lab  07/24/12 1147 07/24/12 2040 07/25/12 0007 07/25/12 0415 07/25/12 1138  GLUCAP 121* 101* 95 102* 101*     Micro Results: Recent Results (from the past 240 hour(s))  URINE CULTURE     Status: None   Collection Time    07/21/12  5:34 PM      Result Value Range Status   Specimen Description URINE, CATHETERIZED   Final   Special Requests NONE   Final   Culture  Setup Time 07/21/2012 21:22   Final   Colony Count NO GROWTH   Final   Culture NO GROWTH   Final   Report Status 07/22/2012 FINAL   Final    Studies/Results: Dg Chest 1 View  07/21/2012  *RADIOLOGY REPORT*  Clinical Data: Shortness of breath and wheezing.  Altered mental status.  CHEST - 1 VIEW  Comparison: PA and lateral chest 07/11/2011.  Findings: There is cardiomegaly and pulmonary edema.  Small left effusion and basilar airspace disease are also identified.  IMPRESSION:  1.  Cardiomegaly and pulmonary edema. 2.  Small left pleural effusion and basilar airspace disease which could be due to atelectasis or pneumonia.   Original Report Authenticated By: Holley Dexter, M.D.    Dg Chest 2 View  07/22/2012  *RADIOLOGY REPORT*  Clinical Data: Evaluate for infiltrate  CHEST - 2 VIEW  Comparison: 07/21/2012  Findings: Moderate cardiac enlargement.  There is atelectasis and airspace consolidation noted within the left lower lobe.  Mild diffuse interstitial edema is noted.  No new findings identified.  IMPRESSION:  1.  Persistent pulmonary edema. 2.  No change in aeration to the left lower lobe.   Original Report Authenticated By: Signa Kell, M.D.    Ct Head Wo Contrast  07/21/2012  *RADIOLOGY REPORT*  Clinical Data: Altered mental status.  The patient is unresponsive.  CT HEAD WITHOUT CONTRAST  Technique:  Contiguous axial images were obtained from the base of the skull through the vertex without contrast.  Comparison: 02/09/2008  Findings: The patient has an acute large non hemorrhagic left middle cerebral artery distribution  infarct with slight edema.  No midline shift at this time. There is a hyper dense branch of the left middle cerebral artery seen on image number 11 of series 2, consistent with acute thrombosis.  There is an old left occipital infarct, new since 02/09/2008.  Diffuse cerebral cortical atrophy, most prominent in the temporal lobes.  No significant osseous abnormality.  IMPRESSION:  1.  Large nonhemorrhagic left middle cerebral artery infarct with early edema without midline shift. 2.  Old left occipital infarct. 3.  Atrophy, most prominent in the temporal lobes.   Original Report Authenticated By: Francene Boyers, M.D.    Mr Brain Wo Contrast  07/22/2012  *RADIOLOGY REPORT*  Clinical Data: Altered mental status.  Abnormal CT.  MRI HEAD WITHOUT CONTRAST  Technique:  Multiplanar, multiecho pulse sequences of the brain and surrounding structures were obtained according to standard protocol without  intravenous contrast.  Comparison: 07/21/2012 CT.  03/08/2004 MR.  Findings: Motion degraded exam.  MR angiogram was ordered although the patient not able to cooperate for this exam.  Large left hemispheric middle cerebral artery distribution infarct involving left temporal lobe, left frontal lobe and left opercular/sub insular region.  Involvement of the left lenticular nucleus/left corona radiata.  No intracranial hemorrhage.  Local mass effect without midline shift.  Remote left occipital lobe infarct with encephalomalacia.  Mild small vessel disease type changes.  Global atrophy without hydrocephalus.  No intracranial mass lesion detected on this unenhanced exam.  Limited evaluation of the intracranial vascular structures secondary to the motion.  Thrombus within the left middle cerebral artery suspected with occluded branches distal to this region.  IMPRESSION: Motion degraded exam.  Large left middle cerebral artery distribution infarct as noted above.  Thrombus within the left middle cerebral artery suspected with  occluded branches distal to this region.  This has been made a PRA call report utilizing dashboard call feature.   Original Report Authenticated By: Lacy Duverney, M.D.     Medications: Scheduled Meds: . aspirin  300 mg Rectal Daily   Or  . aspirin  325 mg Oral Daily  . free water  150 mL Per Tube Q6H  . ipratropium  0.5 mg Nebulization Q6H  . levofloxacin (LEVAQUIN) IV  750 mg Intravenous Q24H  . levothyroxine  37.5 mcg Intravenous QAC breakfast  . metoprolol  2.5 mg Intravenous Q12H  . pantoprazole (PROTONIX) IV  40 mg Intravenous QHS  . sodium chloride  3 mL Intravenous Q12H      LOS: 4 days   Tyheim Vanalstyne M.D. [facility] 07/25/2012, 1:59 PM Pager: [phone]  If 7PM-7AM, please contact night-coverage [email or web address] Password [email or web address]

## 2012-07-25 NOTE — Progress Notes (Addendum)
NUTRITION FOLLOW UP  Intervention:   Initiate Jevity 1.2 @ 20 ml/hr and increase by 10 ml every 4 hours to goal rate of 60 ml/hr (1728 kcal, 80 grams protein, 1166 ml H2O)  150 ml H2O flush every 6 hours  Nutrition Dx:   Inadequate oral intake related to inability to eat as evidenced by NPO status; ongoing.  Goal:  Pt to meet >/= 90% of their estimated nutrition needs; not yet met.   Monitor:  TF tolerance, weight trend, labs  Assessment:   Pt admitted with large MCA stroke. Pt discussed during rounds MD and team.  Pt failed swallow evaluation, NG tube placed to being enteral nutrition, tip is in the region of the pylorus or duodenal bulb. Pt is a full code per son's wishes.   Height: Ht Readings from Last 1 Encounters:  07/22/12 5\' 6"  (1.676 m)    Weight Status:   Wt Readings from Last 1 Encounters:  07/22/12 188 lb 9.6 oz (85.548 kg)  No new weight  Re-estimated needs:  Kcal: 1600-1750  Protein: 80-90 grams  Fluid: > 1.6 L/day  Skin: no issues noted  Diet Order: NPO   Intake/Output Summary (Last 24 hours) at 07/25/12 0919 Last data filed at 07/25/12 1610  Gross per 24 hour  Intake      0 ml  Output   2155 ml  Net  -2155 ml    Last BM: 4/15   Labs:   Recent Labs Lab 07/23/12 0510 07/24/12 0455 07/25/12 0515  NA 138 139 135  K 4.0 4.2 4.0  CL 100 102 101  CO2 30 31 28   BUN 20 18 15   CREATININE 0.98 0.89 0.79  CALCIUM 8.4 8.3* 8.5  GLUCOSE 125* 110* 105*    CBG (last 3)   Recent Labs  07/24/12 2040 07/25/12 0007 07/25/12 0415  GLUCAP 101* 95 102*    Scheduled Meds: . aspirin  300 mg Rectal Daily   Or  . aspirin  325 mg Oral Daily  . ipratropium  0.5 mg Nebulization Q6H  . levofloxacin (LEVAQUIN) IV  750 mg Intravenous Q24H  . levothyroxine  37.5 mcg Intravenous QAC breakfast  . metoprolol  2.5 mg Intravenous Q12H  . pantoprazole (PROTONIX) IV  40 mg Intravenous QHS  . sodium chloride  3 mL Intravenous Q12H    Continuous  Infusions: . feeding supplement (JEVITY 1.2 CAL)      Kendell Bane RD, LDN, CNSC [phone] Pager [phone] After Hours Pager

## 2012-07-25 NOTE — Progress Notes (Signed)
Pt unable to wear CPAP at this time due to NG tube.

## 2012-07-26 ENCOUNTER — Encounter (HOSPITAL_COMMUNITY): Payer: Self-pay | Admitting: *Deleted

## 2012-07-26 ENCOUNTER — Encounter (HOSPITAL_COMMUNITY)
Admission: EM | Disposition: A | Discharge status: Skilled Nursing Facility | Payer: Self-pay | Source: Home / Self Care | Attending: Internal Medicine

## 2012-07-26 DIAGNOSIS — I369 Nonrheumatic tricuspid valve disorder, unspecified: Secondary | ICD-10-CM

## 2012-07-26 DIAGNOSIS — R269 Unspecified abnormalities of gait and mobility: Secondary | ICD-10-CM

## 2012-07-26 HISTORY — PX: TEE WITHOUT CARDIOVERSION: SHX5443

## 2012-07-26 LAB — GLUCOSE, CAPILLARY
Glucose-Capillary: 147 mg/dL — ABNORMAL HIGH (ref 70–99)
Glucose-Capillary: 118 mg/dL — ABNORMAL HIGH (ref 70–99)
Glucose-Capillary: 103 mg/dL — ABNORMAL HIGH (ref 70–99)

## 2012-07-26 SURGERY — ECHOCARDIOGRAM, TRANSESOPHAGEAL
Anesthesia: Moderate Sedation

## 2012-07-26 MED ORDER — DEXTROSE-NACL 5-0.9 % IV SOLN
INTRAVENOUS | Status: DC
  Administered 2012-07-26 – 2012-08-01 (×5): via INTRAVENOUS

## 2012-07-26 MED ORDER — BUTAMBEN-TETRACAINE-BENZOCAINE 2-2-14 % EX AERO
INHALATION_SPRAY | CUTANEOUS | Status: DC | PRN
  Administered 2012-07-26: 2 via TOPICAL
  Administered 2012-07-26: 1 via TOPICAL

## 2012-07-26 MED ORDER — SODIUM CHLORIDE 0.9 % IV SOLN: INTRAVENOUS | Status: DC

## 2012-07-26 MED ORDER — MIDAZOLAM HCL 10 MG/2ML IJ SOLN
INTRAMUSCULAR | Status: DC | PRN
  Administered 2012-07-26: 1 mg via INTRAVENOUS

## 2012-07-26 MED ORDER — FENTANYL CITRATE 0.05 MG/ML IJ SOLN
INTRAMUSCULAR | Status: DC | PRN
  Administered 2012-07-26: 25 ug via INTRAVENOUS

## 2012-07-26 NOTE — Progress Notes (Signed)
Speech Language Pathology Dysphagia Treatment Patient Details Name: Jenny Brady MRN: [id number] DOB: 1926-03-06 Today's Date: 07/26/2012 Time: 4098-1191 SLP Time Calculation (min): 13 min  Assessment / Plan / Recommendation Clinical Impression  Treatment session focused on PO trials for possible initiation of diet. Pt fully alert today, clearly responsive to spanish speaking SLP, though non verbal and moaning. SLP provided three ice chips (limited due to pt NPO for TEE, ice chips ok per Dr. Anne Hahn). Pt initially turned head away with tactile cues of ice/spoon to lips. Then once ice between teeth, pt masticated , transited and swallowed in a timely manner. Presentation today very promising for return to oral intake. Further testing today impossible due to NPO for TEE. Will recommend SLP f/u Saturday for further trials for possible intiation of diet or MBS.     Diet Recommendation  Continue with Current Diet: NPO    SLP Plan Continue with current plan of care   Pertinent Vitals/Pain NA   Swallowing Goals  SLP Swallowing Goals Goal #3: Pt will accept ice chips within 10-15 seconds to aid in determining readiness for po with total cues.   Swallow Study Goal #3 - Progress: Met  General Temperature Spikes Noted: No Respiratory Status: Supplemental O2 delivered via (comment) Behavior/Cognition: Alert;Requires cueing Oral Cavity - Dentition: Adequate natural dentition Patient Positioning: Upright in bed  Oral Cavity - Oral Hygiene     Dysphagia Treatment Treatment focused on: Facilitation of oral preparatory phase;Facilitation of oral phase;Upgraded PO texture trials Treatment Methods/Modalities: Skilled observation;Differential diagnosis Patient observed directly with PO's: Yes Type of PO's observed: Ice chips Reason PO's not observed: NPO for procedure/test (minimal ice chips given due to NPO for TEE) Feeding: Total assist Liquids provided via: Teaspoon Oral Phase Signs &  Symptoms: Anterior loss/spillage;Prolonged bolus formation Type of cueing: Verbal Amount of cueing: Total   GO    [place], [place] CCC-SLP [phone]  [name] 07/26/2012, 11:58 AM

## 2012-07-26 NOTE — Interval H&P Note (Signed)
History and Physical Interval Note:  07/26/2012 2:12 PM  Jenny Brady  has presented today for surgery, with the diagnosis of stroke  The various methods of treatment have been discussed with the patient and family. After consideration of risks, benefits and other options for treatment, the patient has consented to  Procedure(s): TRANSESOPHAGEAL ECHOCARDIOGRAM (TEE) (N/A) as a surgical intervention .  The patient's history has been reviewed, patient examined, no change in status, stable for surgery.  I have reviewed the patient's chart and labs.  Questions were answered to the patient's satisfaction.     Izabel Chim [employer]

## 2012-07-26 NOTE — CV Procedure (Signed)
Procedure: TEE  Indication: CVA  Sedation: Versed 1 mg IV, Fentanyl 25 mcg IV (consent from son).   Findings:  Please see echo section of chart for full report.  Moderate LV dilation.  Severe systolic dysfunction, EF 20% with global hypokinesis.  There was a small mobile structure within the trabeculations in the anterior wall.  It may be a loose trabeculation section but cannot rule out small thrombus.  Mildly dilated RV with normal systolic function.  Severe pulmonary hypertension (peak RV-RA gradient 65 mmHg).  Mild-moderate MR, moderate TR.  No PFO.  No LAA thrombus.    Given dilated LV with severe dysfunction and ? Possible small thrombus, would ideally have her on coumadin.  However, will need to decide if she would be a candidate for this (fall risk, etc).   No complications.   Jenny Brady 07/26/2012 3:18 PM

## 2012-07-26 NOTE — Progress Notes (Signed)
Patient ID: Jenny Brady  female  RUE:[id number]    DOB: 12/15/1925    DOA: 07/21/2012  PCP: [name], MD  Assessment/Plan:    Acute ischemic large left MCA stroke:  - CT head was obtained which showed large left MCA distribution infarct, most likely embolic. Not a candidate for thrombolytics due to unknown time of onset, delayed presentation. - Continue aspirin PR until taking oral - MRI of the brain showed large left hemispheric middle cerebral artery distribution infarct involving left temporal lobe, left frontal lobe and left opercular/sub insular region, involvement of the left lenticular nucleus/left corona radiata with local mass effect. Thrombus within the left middle cerebral artery suspected with occluded branches distal to this region. - PT, OT, recommending skilled nursing facility - 2-D echo showed EF of 15%, severe tricuspid regurgitation, moderate PHTN, PAP 62, moderate MR -  carotid Dopplers showed no ICA stenosis, antegrade vertebral flow - TEE pending, if positive for PFO, will check bilateral lower extremity Dopplers to rule out DVT - Failed swallow eval, placed nasogastric feeding tube, follow serial swallow evaluations, PEG tube if fails swallow evaluation, will need to address this with the son again as he did not make any formal decision about the PEG tube .     DEMENTIA-mod-severe: Unknown baseline however patient is somewhat alert but not following any commands with new large left MCA stroke    Hyperthyroidism-s/p RAI admin: Now hypothyroidism, TSH 13.9 - Patient was on Synthroid daily, increased to 75 mcg, but change to IV (NPO status, 1/2 dose)     Coronary artery disease with acute on chronic systolic CHF (EF was 25-30% in 2012) - 2-D echo shows EF of 15% , BP currently borderline low - Will hold her Lasix to avoid hypotension in view of acute CVA    Dyspnea multifactorial likely due to acute on chronic systolic CHF and left lung pneumonia      CAP (community acquired pneumonia)- left lung -Currently on IV Levaquin, nebs     UTI (lower urinary tract infection):  - Urine culture showed no growth  DVT Prophylaxis: SCD's  Code Status:  Full code  Disposition: Per patient's son, Kathrine Rieves  Goals of care  1) he wants her to be FULL CODE STATUS and continue aggressive care "to keep her alive and going" 2) okay for artificial feeding temporarily but no decision regarding permanent feeding   Subjective: Very lethargic barely opens eyes to verbal stimuli  Objective: Weight change:   Intake/Output Summary (Last 24 hours) at 07/26/12 1606 Last data filed at 07/26/12 1527  Gross per 24 hour  Intake    100 ml  Output   1300 ml  Net  -1200 ml   Blood pressure 128/67, pulse 74, temperature 97.6 F (36.4 C), temperature source Oral, resp. rate 20, height 5\' 6"  (1.676 m), weight 85.548 kg (188 lb 9.6 oz), SpO2 95.00%.  Physical Exam: General: Very lethargic, barely rousable to verbal stimuli NG tube in place CVS: S1-S2 clear Chest: CTAB Abdomen: obese, soft, NT, ND, NBS Extremities: no c/c/e Neuro: Opens eyes to verbal stimuli but does not follow commands  Lab Results: Basic Metabolic Panel:  Recent Labs Lab 07/24/12 0455 07/25/12 0515  NA 139 135  K 4.2 4.0  CL 102 101  CO2 31 28  GLUCOSE 110* 105*  BUN 18 15  CREATININE 0.89 0.79  CALCIUM 8.3* 8.5   Liver Function Tests:  Recent Labs Lab 07/21/12 1635  AST 16  ALT 12  ALKPHOS 107  BILITOT 0.6  PROT 6.4  ALBUMIN 2.7*   CBC:  Recent Labs Lab 07/21/12 1635 07/21/12 1732  WBC 6.3  --   NEUTROABS 4.4  --   HGB 10.7* 12.2  HCT 35.2* 36.0  MCV 77.7*  --   PLT 308  --    Cardiac Enzymes:  Recent Labs Lab 07/22/12 0032 07/22/12 0530 07/22/12 1207  TROPONINI <0.30 <0.30 <0.30   BNP: No components found with this basename: POCBNP,  CBG:  Recent Labs Lab 07/25/12 1605 07/25/12 2019 07/25/12 2347 07/26/12 0410 07/26/12 1208   GLUCAP 124* 133* 144* 147* 118*     Micro Results: Recent Results (from the past 240 hour(s))  URINE CULTURE     Status: None   Collection Time    07/21/12  5:34 PM      Result Value Range Status   Specimen Description URINE, CATHETERIZED   Final   Special Requests NONE   Final   Culture  Setup Time 07/21/2012 21:22   Final   Colony Count NO GROWTH   Final   Culture NO GROWTH   Final   Report Status 07/22/2012 FINAL   Final    Studies/Results: Dg Chest 1 View  07/21/2012  *RADIOLOGY REPORT*  Clinical Data: Shortness of breath and wheezing.  Altered mental status.  CHEST - 1 VIEW  Comparison: PA and lateral chest 07/11/2011.  Findings: There is cardiomegaly and pulmonary edema.  Small left effusion and basilar airspace disease are also identified.  IMPRESSION:  1.  Cardiomegaly and pulmonary edema. 2.  Small left pleural effusion and basilar airspace disease which could be due to atelectasis or pneumonia.   Original Report Authenticated By: Holley Dexter, M.D.    Dg Chest 2 View  07/22/2012  *RADIOLOGY REPORT*  Clinical Data: Evaluate for infiltrate  CHEST - 2 VIEW  Comparison: 07/21/2012  Findings: Moderate cardiac enlargement.  There is atelectasis and airspace consolidation noted within the left lower lobe.  Mild diffuse interstitial edema is noted.  No new findings identified.  IMPRESSION:  1.  Persistent pulmonary edema. 2.  No change in aeration to the left lower lobe.   Original Report Authenticated By: Signa Kell, M.D.    Ct Head Wo Contrast  07/21/2012  *RADIOLOGY REPORT*  Clinical Data: Altered mental status.  The patient is unresponsive.  CT HEAD WITHOUT CONTRAST  Technique:  Contiguous axial images were obtained from the base of the skull through the vertex without contrast.  Comparison: 02/09/2008  Findings: The patient has an acute large non hemorrhagic left middle cerebral artery distribution infarct with slight edema.  No midline shift at this time. There is a hyper  dense branch of the left middle cerebral artery seen on image number 11 of series 2, consistent with acute thrombosis.  There is an old left occipital infarct, new since 02/09/2008.  Diffuse cerebral cortical atrophy, most prominent in the temporal lobes.  No significant osseous abnormality.  IMPRESSION:  1.  Large nonhemorrhagic left middle cerebral artery infarct with early edema without midline shift. 2.  Old left occipital infarct. 3.  Atrophy, most prominent in the temporal lobes.   Original Report Authenticated By: Francene Boyers, M.D.    Mr Brain Wo Contrast  07/22/2012  *RADIOLOGY REPORT*  Clinical Data: Altered mental status.  Abnormal CT.  MRI HEAD WITHOUT CONTRAST  Technique:  Multiplanar, multiecho pulse sequences of the brain and surrounding structures were obtained according to standard protocol without intravenous contrast.  Comparison: 07/21/2012 CT.  03/08/2004 MR.  Findings: Motion degraded exam.  MR angiogram was ordered although the patient not able to cooperate for this exam.  Large left hemispheric middle cerebral artery distribution infarct involving left temporal lobe, left frontal lobe and left opercular/sub insular region.  Involvement of the left lenticular nucleus/left corona radiata.  No intracranial hemorrhage.  Local mass effect without midline shift.  Remote left occipital lobe infarct with encephalomalacia.  Mild small vessel disease type changes.  Global atrophy without hydrocephalus.  No intracranial mass lesion detected on this unenhanced exam.  Limited evaluation of the intracranial vascular structures secondary to the motion.  Thrombus within the left middle cerebral artery suspected with occluded branches distal to this region.  IMPRESSION: Motion degraded exam.  Large left middle cerebral artery distribution infarct as noted above.  Thrombus within the left middle cerebral artery suspected with occluded branches distal to this region.  This has been made a PRA call report  utilizing dashboard call feature.   Original Report Authenticated By: Lacy Duverney, M.D.     Medications: Scheduled Meds: . aspirin  300 mg Rectal Daily   Or  . aspirin  325 mg Oral Daily  . free water  150 mL Per Tube Q6H  . ipratropium  0.5 mg Nebulization BID  . levofloxacin (LEVAQUIN) IV  750 mg Intravenous Q24H  . levothyroxine  37.5 mcg Intravenous QAC breakfast  . metoprolol  2.5 mg Intravenous Q12H  . pantoprazole (PROTONIX) IV  40 mg Intravenous QHS  . sodium chloride  3 mL Intravenous Q12H      LOS: 5 days   [facility] M.D. [facility] 07/26/2012, 4:06 PM Pager: [phone]  If 7PM-7AM, please contact night-coverage [email or web address] Password [email or web address]

## 2012-07-26 NOTE — H&P (View-Only) (Signed)
Stroke Team Progress Note  HISTORY Jenny Brady is an 79 y.o. female with a past medical history significant for dementia, CAD, CHF, hyperthyroidism, transferred from [facility] for further management of a large left MCA distribution infarct. Patient is not able to provide information and thus all clinical data is gathered from patient's medical record. She appaently lives by herself and on 07/21/2012 she was found by her son unresponsive with some respiratory distress and was taken to [facility] where she was found to be poorly responsive and unable to move the right side. An emergent CT brain revealed a large area of decreased attenuation in the left MCA distribution suggestive of an ischemic infarct. Transferred to [facility] for further management. Patient was not a TPA candidate secondary to unknown time of symptom onset.  SUBJECTIVE No family members present this morning. The patient is lying in bed staring straight ahead. She is mute. She does not follow commands.  OBJECTIVE Most recent Vital Signs: Filed Vitals:   07/25/12 2055 07/25/12 2113 07/26/12 0136 07/26/12 0819  BP:  125/66 118/54   Pulse: 84 84 81   Temp:  98.5 F (36.9 C) 97.6 F (36.4 C)   TempSrc:  Axillary Axillary   Resp: 18 22 18    Height:      Weight:      SpO2: 96% 94% 96% 98%   CBG (last 3)   Recent Labs  07/25/12 2019 07/25/12 2347 07/26/12 0410  GLUCAP 133* 144* 147*   IV Fluid Intake:   . feeding supplement (JEVITY 1.2 CAL) Stopped (07/26/12 0859)   MEDICATIONS  . aspirin  300 mg Rectal Daily   Or  . aspirin  325 mg Oral Daily  . free water  150 mL Per Tube Q6H  . ipratropium  0.5 mg Nebulization BID  . levofloxacin (LEVAQUIN) IV  750 mg Intravenous Q24H  . levothyroxine  37.5 mcg Intravenous QAC breakfast  . metoprolol  2.5 mg Intravenous Q12H  . pantoprazole (PROTONIX) IV  40 mg Intravenous QHS  . sodium chloride  3 mL Intravenous Q12H   PRN:  sodium chloride, acetaminophen, acetaminophen, albuterol,  ondansetron (ZOFRAN) IV, sodium chloride  Diet:  NPO  Activity:  OOB with assistance DVT Prophylaxis:  SCDs   CLINICALLY SIGNIFICANT STUDIES Basic Metabolic Panel:   Recent Labs Lab 07/24/12 0455 07/25/12 0515  NA 139 135  K 4.2 4.0  CL 102 101  CO2 31 28  GLUCOSE 110* 105*  BUN 18 15  CREATININE 0.89 0.79  CALCIUM 8.3* 8.5   Liver Function Tests:   Recent Labs Lab 07/21/12 1635  AST 16  ALT 12  ALKPHOS 107  BILITOT 0.6  PROT 6.4  ALBUMIN 2.7*   CBC:   Recent Labs Lab 07/21/12 1635 07/21/12 1732  WBC 6.3  --   NEUTROABS 4.4  --   HGB 10.7* 12.2  HCT 35.2* 36.0  MCV 77.7*  --   PLT 308  --    Coagulation: No results found for this basename: LABPROT, INR,  in the last 168 hours Cardiac Enzymes:   Recent Labs Lab 07/22/12 0032 07/22/12 0530 07/22/12 1207  TROPONINI <0.30 <0.30 <0.30   Urinalysis:   Recent Labs Lab 07/21/12 1734  COLORURINE YELLOW  LABSPEC 1.015  PHURINE 7.5  GLUCOSEU NEGATIVE  HGBUR NEGATIVE  BILIRUBINUR NEGATIVE  KETONESUR NEGATIVE  PROTEINUR 30*  UROBILINOGEN 1.0  NITRITE NEGATIVE  LEUKOCYTESUR SMALL*   Lipid Panel    Component Value Date/Time   CHOL 136 07/22/2012  0032   TRIG 93 07/22/2012 0032   HDL 27* 07/22/2012 0032   CHOLHDL 5.0 07/22/2012 0032   VLDL 19 07/22/2012 0032   LDLCALC 90 07/22/2012 0032   HgbA1C  Lab Results  Component Value Date   HGBA1C 7.3* 07/22/2012   Urine Drug Screen:   No results found for this basename: labopia,  cocainscrnur,  labbenz,  amphetmu,  thcu,  labbarb    Alcohol Level: No results found for this basename: ETH,  in the last 168 hours  CT of the brain  07/21/2012   1.  Large nonhemorrhagic left middle cerebral artery infarct with early edema without midline shift. 2.  Old left occipital infarct. 3.  Atrophy, most prominent in the temporal lobes.   MRI of the brain  07/22/2012  Motion degraded exam.  Large left middle cerebral artery distribution infarct as noted above.  Thrombus  within the left middle cerebral artery suspected with occluded branches distal to this region.    MRA of the brain   1. Left MCA occluded or nearly occluded in the M1 segment just  beyond its origin. This corresponds to the earlier MRI findings.  Faint more distal left MCA flow signal; little to no M2 segment  flow signal.  2. Moderately degraded by motion despite repeated imaging  attempts.   2D Echocardiogram  EF 15% with no source of embolus.   Carotid Doppler  No evidence of hemodynamically significant internal carotid artery stenosis. Vertebral artery flow is antegrade.   TEE pending today.  CXR   07/22/2012    1.  Persistent pulmonary edema. 2.  No change in aeration to the left lower lobe.  07/21/2012  1.  Cardiomegaly and pulmonary edema. 2.  Small left pleural effusion and basilar airspace disease which could be due to atelectasis or pneumonia.   EKG  normal sinus rhythm, LBBB.   Therapy Recommendations CIR, CIR recommends SNF  Physical Exam   General - 79 year old female lying in bed mute with left gaze preference. NG tube in place. Heart - Regular rate and rhythm - distant. Lungs - Clear to auscultation anteriorly. Abdomen - Soft - non tender Skin - Warm and dry    The patient is alert, non-verbal. The patient has a pain response with passive movement of any extremity.  Obese. 1 to 2 plus edema at the ankles.  Left gaze preference, and no blink to threat from the right. Cannot doll the eyes across midline.  Right hemiparesis, arm> leg, severe. Occasional spontaneous movements of the right foot . Will move the left side. The patient is mute, but will follow some verbal commands.  Could not ambulate, and the patient will not follow commands for cerebellar testing.  DTR are depressed, symmetric, right babinski.   ASSESSMENT Jenny Brady is a 79 y.o. demented female presenting with unresponsiveness and respiratory distress after being found down at home.  Imaging confirms a left large MCA infarct. Infarct felt to be embolic secondary to unknown etiology (most common etiology in this age group is atrial fibrillation).  On aspirin 81 mg orally every day prior to admission. Now on aspirin suppository for secondary stroke prevention. Patient with resultant right hemiparesis, global aphasia, dysphagia. Work up completed.    CAD  LDL 90 - not on any statin medication prior to admission.   HgbA1c 7.3  Severe dysphagia, failed swallow eval. Now has an NG tube  Morbid obesity  Baseline dementia  Community acquired pneumonia, on levaquin  UTI,  on levaquin  Cardiomyopathy with EF 15%  Likely obstructive sleep apnea  The EF is in a range where anticoagulation should be considered. The TEE will help determine if an intraventricular clot is present. The patient is NPO for a TEE today. She may require a PEG tube in the near future. The patient cannot be anticoagulated until this decision is made. ST is following.  Hospital day # 5  TREATMENT/PLAN  Continue aspirin suppository for secondary stroke prevention. Consider anticoagulation as above.  Repeat swallow eval over the weekend; plan for PEG Monday if she does not pass  F/u  TCD  TEE April 18 to look for embolic source. rearanged with [facility].  If positive for PFO (patent foramen ovale), check bilateral lower extremity venous dopplers to rule out DVT as possible source of stroke. SNF at discharge  Delton See PA-C [facility] Pager [phone] 07/26/2012, 10:10 AM  I have personally obtained a history, examined the patient, evaluated imaging results, and formulated the assessment and plan of care. I agree with the above.   [name]

## 2012-07-26 NOTE — Progress Notes (Signed)
  Echocardiogram Echocardiogram Transesophageal has been performed.  Jorje Guild 07/26/2012, 3:34 PM

## 2012-07-26 NOTE — Progress Notes (Signed)
Stroke Team Progress Note  HISTORY Jenny Brady is an 79 y.o. female with a past medical history significant for dementia, CAD, CHF, hyperthyroidism, transferred from [facility] for further management of a large left MCA distribution infarct. Patient is not able to provide information and thus all clinical data is gathered from patient's medical record. She appaently lives by herself and on 07/21/2012 she was found by her son unresponsive with some respiratory distress and was taken to [facility] where she was found to be poorly responsive and unable to move the right side. An emergent CT brain revealed a large area of decreased attenuation in the left MCA distribution suggestive of an ischemic infarct. Transferred to [facility] for further management. Patient was not a TPA candidate secondary to unknown time of symptom onset.  SUBJECTIVE No family members present this morning. The patient is lying in bed staring straight ahead. She is mute. She does not follow commands.  OBJECTIVE Most recent Vital Signs: Filed Vitals:   07/25/12 2055 07/25/12 2113 07/26/12 0136 07/26/12 0819  BP:  125/66 118/54   Pulse: 84 84 81   Temp:  98.5 F (36.9 C) 97.6 F (36.4 C)   TempSrc:  Axillary Axillary   Resp: 18 22 18   Height:      Weight:      SpO2: 96% 94% 96% 98%   CBG (last 3)   Recent Labs  07/25/12 2019 07/25/12 2347 07/26/12 0410  GLUCAP 133* 144* 147*   IV Fluid Intake:   . feeding supplement (JEVITY 1.2 CAL) Stopped (07/26/12 0859)   MEDICATIONS  . aspirin  300 mg Rectal Daily   Or  . aspirin  325 mg Oral Daily  . free water  150 mL Per Tube Q6H  . ipratropium  0.5 mg Nebulization BID  . levofloxacin (LEVAQUIN) IV  750 mg Intravenous Q24H  . levothyroxine  37.5 mcg Intravenous QAC breakfast  . metoprolol  2.5 mg Intravenous Q12H  . pantoprazole (PROTONIX) IV  40 mg Intravenous QHS  . sodium chloride  3 mL Intravenous Q12H   PRN:  sodium chloride, acetaminophen, acetaminophen, albuterol,  ondansetron (ZOFRAN) IV, sodium chloride  Diet:  NPO  Activity:  OOB with assistance DVT Prophylaxis:  SCDs   CLINICALLY SIGNIFICANT STUDIES Basic Metabolic Panel:   Recent Labs Lab 07/24/12 0455 07/25/12 0515  NA 139 135  K 4.2 4.0  CL 102 101  CO2 31 28  GLUCOSE 110* 105*  BUN 18 15  CREATININE 0.89 0.79  CALCIUM 8.3* 8.5   Liver Function Tests:   Recent Labs Lab 07/21/12 1635  AST 16  ALT 12  ALKPHOS 107  BILITOT 0.6  PROT 6.4  ALBUMIN 2.7*   CBC:   Recent Labs Lab 07/21/12 1635 07/21/12 1732  WBC 6.3  --   NEUTROABS 4.4  --   HGB 10.7* 12.2  HCT 35.2* 36.0  MCV 77.7*  --   PLT 308  --    Coagulation: No results found for this basename: LABPROT, INR,  in the last 168 hours Cardiac Enzymes:   Recent Labs Lab 07/22/12 0032 07/22/12 0530 07/22/12 1207  TROPONINI <0.30 <0.30 <0.30   Urinalysis:   Recent Labs Lab 07/21/12 1734  COLORURINE YELLOW  LABSPEC 1.015  PHURINE 7.5  GLUCOSEU NEGATIVE  HGBUR NEGATIVE  BILIRUBINUR NEGATIVE  KETONESUR NEGATIVE  PROTEINUR 30*  UROBILINOGEN 1.0  NITRITE NEGATIVE  LEUKOCYTESUR SMALL*   Lipid Panel    Component Value Date/Time   CHOL 136 07/22/2012   0032   TRIG 93 07/22/2012 0032   HDL 27* 07/22/2012 0032   CHOLHDL 5.0 07/22/2012 0032   VLDL 19 07/22/2012 0032   LDLCALC 90 07/22/2012 0032   HgbA1C  Lab Results  Component Value Date   HGBA1C 7.3* 07/22/2012   Urine Drug Screen:   No results found for this basename: labopia,  cocainscrnur,  labbenz,  amphetmu,  thcu,  labbarb    Alcohol Level: No results found for this basename: ETH,  in the last 168 hours  CT of the brain  07/21/2012   1.  Large nonhemorrhagic left middle cerebral artery infarct with early edema without midline shift. 2.  Old left occipital infarct. 3.  Atrophy, most prominent in the temporal lobes.   MRI of the brain  07/22/2012  Motion degraded exam.  Large left middle cerebral artery distribution infarct as noted above.  Thrombus  within the left middle cerebral artery suspected with occluded branches distal to this region.    MRA of the brain   1. Left MCA occluded or nearly occluded in the M1 segment just  beyond its origin. This corresponds to the earlier MRI findings.  Faint more distal left MCA flow signal; little to no M2 segment  flow signal.  2. Moderately degraded by motion despite repeated imaging  attempts.   2D Echocardiogram  EF 15% with no source of embolus.   Carotid Doppler  No evidence of hemodynamically significant internal carotid artery stenosis. Vertebral artery flow is antegrade.   TEE pending today.  CXR   07/22/2012    1.  Persistent pulmonary edema. 2.  No change in aeration to the left lower lobe.  07/21/2012  1.  Cardiomegaly and pulmonary edema. 2.  Small left pleural effusion and basilar airspace disease which could be due to atelectasis or pneumonia.   EKG  normal sinus rhythm, LBBB.   Therapy Recommendations CIR, CIR recommends SNF  Physical Exam   General - 79-year-old female lying in bed mute with left gaze preference. NG tube in place. Heart - Regular rate and rhythm - distant. Lungs - Clear to auscultation anteriorly. Abdomen - Soft - non tender Skin - Warm and dry    The patient is alert, non-verbal. The patient has a pain response with passive movement of any extremity.  Obese. 1 to 2 plus edema at the ankles.  Left gaze preference, and no blink to threat from the right. Cannot doll the eyes across midline.  Right hemiparesis, arm> leg, severe. Occasional spontaneous movements of the right foot . Will move the left side. The patient is mute, but will follow some verbal commands.  Could not ambulate, and the patient will not follow commands for cerebellar testing.  DTR are depressed, symmetric, right babinski.   ASSESSMENT [name] is a 79 y.o. demented female presenting with unresponsiveness and respiratory distress after being found down at home.  Imaging confirms a left large MCA infarct. Infarct felt to be embolic secondary to unknown etiology (most common etiology in this age group is atrial fibrillation).  On aspirin 81 mg orally every day prior to admission. Now on aspirin suppository for secondary stroke prevention. Patient with resultant right hemiparesis, global aphasia, dysphagia. Work up completed.    CAD  LDL 90 - not on any statin medication prior to admission.   HgbA1c 7.3  Severe dysphagia, failed swallow eval. Now has an NG tube  Morbid obesity  Baseline dementia  Community acquired pneumonia, on levaquin  UTI,   on levaquin  Cardiomyopathy with EF 15%  Likely obstructive sleep apnea  The EF is in a range where anticoagulation should be considered. The TEE will help determine if an intraventricular clot is present. The patient is NPO for a TEE today. She may require a PEG tube in the near future. The patient cannot be anticoagulated until this decision is made. ST is following.  Hospital day # 5  TREATMENT/PLAN  Continue aspirin suppository for secondary stroke prevention. Consider anticoagulation as above.  Repeat swallow eval over the weekend; plan for PEG Monday if she does not pass  F/u  TCD  TEE April 18 to look for embolic source. rearanged with [facility].  If positive for PFO (patent foramen ovale), check bilateral lower extremity venous dopplers to rule out DVT as possible source of stroke. SNF at discharge  David Rinehuls PA-C [facility] Pager [phone] 07/26/2012, 10:10 AM  I have personally obtained a history, examined the patient, evaluated imaging results, and formulated the assessment and plan of care. I agree with the above.   Jenny Brady   

## 2012-07-27 ENCOUNTER — Inpatient Hospital Stay (HOSPITAL_COMMUNITY): Payer: Medicare Other

## 2012-07-27 LAB — GLUCOSE, CAPILLARY
Glucose-Capillary: 101 mg/dL — ABNORMAL HIGH (ref 70–99)
Glucose-Capillary: 131 mg/dL — ABNORMAL HIGH (ref 70–99)
Glucose-Capillary: 141 mg/dL — ABNORMAL HIGH (ref 70–99)

## 2012-07-27 LAB — HEPARIN LEVEL (UNFRACTIONATED): Heparin Unfractionated: <0.1 IU/mL — ABNORMAL LOW (ref 0.30–0.70)

## 2012-07-27 MED ORDER — HEPARIN (PORCINE) IN NACL 100-0.45 UNIT/ML-% IJ SOLN
1000.0000 [IU]/h | INTRAMUSCULAR | Status: DC
  Administered 2012-07-27: 850 [IU]/h via INTRAVENOUS
  Administered 2012-07-28: 1000 [IU]/h via INTRAVENOUS
  Administered 2012-07-28: 1150 [IU]/h via INTRAVENOUS
  Administered 2012-07-29 – 2012-07-30 (×2): 1000 [IU]/h via INTRAVENOUS
  Filled 2012-07-27 (×7): qty 250

## 2012-07-27 NOTE — Progress Notes (Addendum)
Stroke Team Progress Note  HISTORY Jenny Brady is an 79 y.o. female with a past medical history significant for dementia, CAD, CHF, hyperthyroidism, transferred from [facility] for further management of a large left MCA distribution infarct. Patient is not able to provide information and thus all clinical data is gathered from patient's medical record. She appaently lives by herself and on 07/21/2012 she was found by her son unresponsive with some respiratory distress and was taken to [facility] where she was found to be poorly responsive and unable to move the right side. An emergent CT brain revealed a large area of decreased attenuation in the left MCA distribution suggestive of an ischemic infarct. Transferred to [facility] for further management. Patient was not a TPA candidate secondary to unknown time of symptom onset.  SUBJECTIVE No family members at the bedside this morning. The patient is mute.  OBJECTIVE Most recent Vital Signs: Filed Vitals:   07/26/12 1530 07/26/12 1805 07/26/12 2119 07/27/12 0605  BP: 128/67 119/61 121/62 125/68  Pulse:  80 91 83  Temp:  98.7 F (37.1 C) 98.2 F (36.8 C) 98.2 F (36.8 C)  TempSrc:  Oral Oral Oral  Resp: 20 22 18 20   Height:      Weight:      SpO2: 95% 98% 100% 98%   CBG (last 3)   Recent Labs  07/26/12 2117 07/27/12 0029 07/27/12 0406  GLUCAP 101* 131* 132*   IV Fluid Intake:   . sodium chloride    . dextrose 5 % and 0.9% NaCl 50 mL/hr at 07/26/12 2048  . feeding supplement (JEVITY 1.2 CAL) Stopped (07/26/12 0859)   MEDICATIONS  . aspirin  300 mg Rectal Daily   Or  . aspirin  325 mg Oral Daily  . free water  150 mL Per Tube Q6H  . ipratropium  0.5 mg Nebulization BID  . levofloxacin (LEVAQUIN) IV  750 mg Intravenous Q24H  . levothyroxine  37.5 mcg Intravenous QAC breakfast  . metoprolol  2.5 mg Intravenous Q12H  . pantoprazole (PROTONIX) IV  40 mg Intravenous QHS  . sodium chloride  3 mL Intravenous Q12H   PRN:  sodium chloride,  acetaminophen, acetaminophen, albuterol, ondansetron (ZOFRAN) IV, sodium chloride  Diet:  NPO  Activity:  OOB with assistance DVT Prophylaxis:  SCDs   CLINICALLY SIGNIFICANT STUDIES Basic Metabolic Panel:   Recent Labs Lab 07/24/12 0455 07/25/12 0515  NA 139 135  K 4.2 4.0  CL 102 101  CO2 31 28  GLUCOSE 110* 105*  BUN 18 15  CREATININE 0.89 0.79  CALCIUM 8.3* 8.5   Liver Function Tests:   Recent Labs Lab 07/21/12 1635  AST 16  ALT 12  ALKPHOS 107  BILITOT 0.6  PROT 6.4  ALBUMIN 2.7*   CBC:   Recent Labs Lab 07/21/12 1635 07/21/12 1732  WBC 6.3  --   NEUTROABS 4.4  --   HGB 10.7* 12.2  HCT 35.2* 36.0  MCV 77.7*  --   PLT 308  --    Coagulation: No results found for this basename: LABPROT, INR,  in the last 168 hours Cardiac Enzymes:   Recent Labs Lab 07/22/12 0032 07/22/12 0530 07/22/12 1207  TROPONINI <0.30 <0.30 <0.30   Urinalysis:   Recent Labs Lab 07/21/12 1734  COLORURINE YELLOW  LABSPEC 1.015  PHURINE 7.5  GLUCOSEU NEGATIVE  HGBUR NEGATIVE  BILIRUBINUR NEGATIVE  KETONESUR NEGATIVE  PROTEINUR 30*  UROBILINOGEN 1.0  NITRITE NEGATIVE  LEUKOCYTESUR SMALL*   Lipid Panel  Component Value Date/Time   CHOL 136 07/22/2012 0032   TRIG 93 07/22/2012 0032   HDL 27* 07/22/2012 0032   CHOLHDL 5.0 07/22/2012 0032   VLDL 19 07/22/2012 0032   LDLCALC 90 07/22/2012 0032   HgbA1C  Lab Results  Component Value Date   HGBA1C 7.3* 07/22/2012   Urine Drug Screen:   No results found for this basename: labopia,  cocainscrnur,  labbenz,  amphetmu,  thcu,  labbarb    Alcohol Level: No results found for this basename: ETH,  in the last 168 hours  CT of the brain  07/21/2012   1.  Large nonhemorrhagic left middle cerebral artery infarct with early edema without midline shift. 2.  Old left occipital infarct. 3.  Atrophy, most prominent in the temporal lobes.   MRI of the brain  07/22/2012  Motion degraded exam.  Large left middle cerebral artery  distribution infarct as noted above.  Thrombus within the left middle cerebral artery suspected with occluded branches distal to this region.    MRA of the brain   1. Left MCA occluded or nearly occluded in the M1 segment just  beyond its origin. This corresponds to the earlier MRI findings.  Faint more distal left MCA flow signal; little to no M2 segment  flow signal.  2. Moderately degraded by motion despite repeated imaging  attempts.   2D Echocardiogram  EF 15% with no source of embolus.   Carotid Doppler  No evidence of hemodynamically significant internal carotid artery stenosis. Vertebral artery flow is antegrade.   TEE 07/26/2012 - ejection fraction 20-25%. There was a small mobile structure attached to the anterior wall of the LV. This may have been a loose trabeculation but cannot rule out thrombus given history.Would consider anticoagulation if she would be a safe candidate.   CXR   07/22/2012    1.  Persistent pulmonary edema. 2.  No change in aeration to the left lower lobe.  07/21/2012  1.  Cardiomegaly and pulmonary edema. 2.  Small left pleural effusion and basilar airspace disease which could be due to atelectasis or pneumonia.   EKG  normal sinus rhythm, LBBB.   Therapy Recommendations CIR, CIR recommends SNF  Physical Exam   General - 79 year old female lying in bed mute with left gaze preference. NG tube no longer present. Heart - Regular rate and rhythm - distant. Lungs - Clear to auscultation anteriorly. Abdomen - Soft - non tender Skin - Warm and dry    The patient is lethargic this morning,  non-verbal. The patient has a pain response with passive movement of any extremity.  Obese. 1 to 2 plus edema at the ankles.  Left gaze preference, and no blink to threat from the right.   Right hemiparesis, arm> leg, severe. Occasional spontaneous movements of the right foot . Will move the left side. The patient is mute, but will follow command to wiggle toes.    Could not ambulate, and the patient will not follow commands for cerebellar testing.  DTR are depressed, symmetric, right babinski.   ASSESSMENT Jenny Brady is a 79 y.o. demented female presenting with unresponsiveness and respiratory distress after being found down at home. Imaging confirms a left large MCA infarct. Infarct felt to be embolic secondary to unknown etiology (most common etiology in this age group is atrial fibrillation).  On aspirin 81 mg orally every day prior to admission. Now on aspirin suppository for secondary stroke prevention. Patient with resultant right hemiparesis, global  aphasia, dysphagia. Work up completed.    CAD  LDL 90 - not on any statin medication prior to admission.   HgbA1c 7.3  Severe dysphagia, failed swallow evaluation. NG-tube was been removed  Morbid obesity  Baseline dementia  Community acquired pneumonia, on levaquin  UTI, on levaquin  Cardiomyopathy with EF 20-25% by TEE.  Likely obstructive sleep apnea  TEE as above. Anticoagulation recommended by cardiology if patient is felt to be a candidate.  She may require a PEG tube in the near future. The patient cannot be anticoagulated until this decision is made. ST is following.  Hospital day # 6  TREATMENT/PLAN  Continue aspirin suppository for secondary stroke prevention. Consider anticoagulation as above.  Repeat swallow eval over the weekend; plan for PEG Monday if she does not pass and the family agrees.  ? Thrombus on TEE, if there is an intracardiac thrombus this may change risk/benefit of starting anticoagulation. I would typically wait at least 7 - 10 days before starting a anticoagulation in a patient with this size stroke, could discuss with cardiology.   SNF at discharge  Delton See PA-C [facility] Pager [phone] 07/27/2012, 7:51 AM  I have personally obtained a history, examined the patient, evaluated imaging results, and  formulated the assessment and plan of care. I agree with the above. L MCA stroke, >? Intracardiac thrombus. If anticoagulation is pursued, would start with stroke protocol heparin(tighter range, no bolus).   Ritta Slot, MD [facility] [phone]  If 7pm- 7am, please page neurology on call at [phone].   Spoke with cardiology, feel likely the intracardiac mass is thrombus, and I feel that with a mobile thrombus, the risk of recurrent embolism is quite high. In this setting, low dose heparin anticoagulation would be reasonable as this could be paused if needed for PEG.   Ritta Slot, MD [facility] [phone]  If 7pm- 7am, please page neurology on call at [phone].

## 2012-07-27 NOTE — Progress Notes (Signed)
ANTICOAGULATION CONSULT NOTE - Follow Up Consult  Pharmacy Consult for Heparin Indication: stroke  Allergies  Allergen Reactions  . Hyoscyamine   . Ivp Dye (Iodinated Diagnostic Agents)   . Septra (Sulfamethoxazole W-Trimethoprim)     Patient Measurements: Height: 5\' 6"  (167.6 cm) Weight: 188 lb 9.6 oz (85.548 kg) IBW/kg (Calculated) : 59.3 Heparin dosing weight: 78 kg  Labs:  Recent Labs  07/25/12 0515 07/27/12 2235  HEPARINUNFRC  --  <0.10*  CREATININE 0.79  --     Estimated Creatinine Clearance: 57.7 ml/min (by C-G formula based on Cr of 0.79).   Medical History: Past Medical History  Diagnosis Date  . Hyperthyroidism     had RAI-is on thyroid replacement, happened maybe this summer  . Coronary artery disease     Had a catheterization per son by Dr. [name] in 1991 no reports in  [place]  . GERD (gastroesophageal reflux disease)     Assessment: Jenny Brady is an 79 y.o. female with a past medical history significant for dementia, CAD, CHF, hyperthyroidism, transferred from [facility] for further management of a large left MCA distribution infarct.  ? Thrombus on TEE on IV heparin stroke protocol.   Heparin level (< 0.1) is below-goal on 850 units/hr. No problem with line / infusion and no bleeding per RN.   Goal of Therapy:  Heparin level 0.3-0.5 units/ml Monitor platelets by anticoagulation protocol: Yes   Plan:  1. Increase IV heparin to 1150 units/hr 2. Heparin level in 8 hours.   Lorre Munroe, PharmD 07/27/2012,11:47 PM

## 2012-07-27 NOTE — Progress Notes (Signed)
Patient will not be wearing CPAP this evening due to NG tube and general discomfort. RN says patient does not have a problem with desaturation while asleep. Patient on continous pulse ox.

## 2012-07-27 NOTE — Progress Notes (Addendum)
Patient ID: Jenny Brady  female  ZOX:[id number]    DOB: May 24, 1925    DOA: 07/21/2012  PCP: [name], MD  Assessment/Plan:    Acute ischemic large left MCA stroke:  - CT head was obtained which showed large left MCA distribution infarct, most likely embolic. Not a candidate for thrombolytics due to unknown time of onset, delayed presentation. - Continue aspirin PR until taking oral - MRI of the brain showed large left hemispheric middle cerebral artery distribution infarct involving left temporal lobe, left frontal lobe and left opercular/sub insular region, involvement of the left lenticular nucleus/left corona radiata with local mass effect. Thrombus within the left middle cerebral artery suspected with occluded branches distal to this region. - PT, OT, recommending skilled nursing facility - 2-D echo showed EF of 15%, severe tricuspid regurgitation, moderate PHTN, PAP 62, moderate MR -  carotid Dopplers showed no ICA stenosis, antegrade vertebral flow - TEE  Showed possible intracardiac thrombus, called and discussed with cardiology and  Dr Shirlee Latch feels that the patient does have intracardiac thrombus.   Dr Amada Jupiter (neurology) has discussed with cardiology and they all feel that patient should be started on heparin protocol as per pharmacy. Will order the heparin  stroke protocol.  - Failed swallow eval, placed nasogastric feeding tube, follow serial swallow evaluations, PEG tube if fails swallow evaluation, will need to address this with the son again as he did not make any formal decision about the PEG tube .     DEMENTIA-mod-severe: Unknown baseline however patient is somewhat alert but not following any commands with new large left MCA stroke    Hyperthyroidism-s/p RAI admin: Now hypothyroidism, TSH 13.9 - Patient was on Synthroid daily, increased to 75 mcg, but change to IV (NPO status, 1/2 dose)     Coronary artery disease with acute on chronic systolic CHF  (EF was 25-30% in 2012) - 2-D echo shows EF of 15% , BP currently borderline low - Will hold her Lasix to avoid hypotension in view of acute CVA    Dyspnea multifactorial likely due to acute on chronic systolic CHF and left lung pneumonia     CAP (community acquired pneumonia)- left lung -Currently on IV Levaquin, nebs     UTI (lower urinary tract infection):  - Urine culture showed no growth  DVT Prophylaxis: SCD's  Code Status:  Full code  Disposition: Per patient's son, Tarisa Paola  Goals of care  1) he wants her to be FULL CODE STATUS and continue aggressive care "to keep her alive and going" 2) okay for artificial feeding temporarily but no decision regarding permanent feeding   Subjective: Very lethargic barely opens eyes to verbal stimuli  Objective: Weight change:   Intake/Output Summary (Last 24 hours) at 07/27/12 1341 Last data filed at 07/27/12 0700  Gross per 24 hour  Intake    100 ml  Output   1300 ml  Net  -1200 ml   Blood pressure 120/60, pulse 75, temperature 98.2 F (36.8 C), temperature source Oral, resp. rate 20, height 5\' 6"  (1.676 m), weight 85.548 kg (188 lb 9.6 oz), SpO2 98.00%.  Physical Exam: General: Very lethargic, barely rousable to verbal stimuli NG tube in place CVS: S1-S2 clear Chest: CTAB Abdomen: obese, soft, NT, ND, NBS Extremities: no c/c/e Neuro: Opens eyes to verbal stimuli but does not follow commands  Lab Results: Basic Metabolic Panel:  Recent Labs Lab 07/24/12 0455 07/25/12 0515  NA 139 135  K 4.2 4.0  CL 102 101  CO2 31 28  GLUCOSE 110* 105*  BUN 18 15  CREATININE 0.89 0.79  CALCIUM 8.3* 8.5   Liver Function Tests:  Recent Labs Lab 07/21/12 1635  AST 16  ALT 12  ALKPHOS 107  BILITOT 0.6  PROT 6.4  ALBUMIN 2.7*   CBC:  Recent Labs Lab 07/21/12 1635 07/21/12 1732  WBC 6.3  --   NEUTROABS 4.4  --   HGB 10.7* 12.2  HCT 35.2* 36.0  MCV 77.7*  --   PLT 308  --    Cardiac Enzymes:  Recent  Labs Lab 07/22/12 0032 07/22/12 0530 07/22/12 1207  TROPONINI <0.30 <0.30 <0.30   BNP: No components found with this basename: POCBNP,  CBG:  Recent Labs Lab 07/26/12 2117 07/27/12 0029 07/27/12 0406 07/27/12 0758 07/27/12 1155  GLUCAP 101* 131* 132* 148* 150*     Micro Results: Recent Results (from the past 240 hour(s))  URINE CULTURE     Status: None   Collection Time    07/21/12  5:34 PM      Result Value Range Status   Specimen Description URINE, CATHETERIZED   Final   Special Requests NONE   Final   Culture  Setup Time 07/21/2012 21:22   Final   Colony Count NO GROWTH   Final   Culture NO GROWTH   Final   Report Status 07/22/2012 FINAL   Final    Studies/Results: Dg Chest 1 View  07/21/2012  *RADIOLOGY REPORT*  Clinical Data: Shortness of breath and wheezing.  Altered mental status.  CHEST - 1 VIEW  Comparison: PA and lateral chest 07/11/2011.  Findings: There is cardiomegaly and pulmonary edema.  Small left effusion and basilar airspace disease are also identified.  IMPRESSION:  1.  Cardiomegaly and pulmonary edema. 2.  Small left pleural effusion and basilar airspace disease which could be due to atelectasis or pneumonia.   Original Report Authenticated By: Holley Dexter, M.D.    Dg Chest 2 View  07/22/2012  *RADIOLOGY REPORT*  Clinical Data: Evaluate for infiltrate  CHEST - 2 VIEW  Comparison: 07/21/2012  Findings: Moderate cardiac enlargement.  There is atelectasis and airspace consolidation noted within the left lower lobe.  Mild diffuse interstitial edema is noted.  No new findings identified.  IMPRESSION:  1.  Persistent pulmonary edema. 2.  No change in aeration to the left lower lobe.   Original Report Authenticated By: Signa Kell, M.D.    Ct Head Wo Contrast  07/21/2012  *RADIOLOGY REPORT*  Clinical Data: Altered mental status.  The patient is unresponsive.  CT HEAD WITHOUT CONTRAST  Technique:  Contiguous axial images were obtained from the base of  the skull through the vertex without contrast.  Comparison: 02/09/2008  Findings: The patient has an acute large non hemorrhagic left middle cerebral artery distribution infarct with slight edema.  No midline shift at this time. There is a hyper dense branch of the left middle cerebral artery seen on image number 11 of series 2, consistent with acute thrombosis.  There is an old left occipital infarct, new since 02/09/2008.  Diffuse cerebral cortical atrophy, most prominent in the temporal lobes.  No significant osseous abnormality.  IMPRESSION:  1.  Large nonhemorrhagic left middle cerebral artery infarct with early edema without midline shift. 2.  Old left occipital infarct. 3.  Atrophy, most prominent in the temporal lobes.   Original Report Authenticated By: Francene Boyers, M.D.    Mr Brain Wo Contrast  07/22/2012  *  RADIOLOGY REPORT*  Clinical Data: Altered mental status.  Abnormal CT.  MRI HEAD WITHOUT CONTRAST  Technique:  Multiplanar, multiecho pulse sequences of the brain and surrounding structures were obtained according to standard protocol without intravenous contrast.  Comparison: 07/21/2012 CT.  03/08/2004 MR.  Findings: Motion degraded exam.  MR angiogram was ordered although the patient not able to cooperate for this exam.  Large left hemispheric middle cerebral artery distribution infarct involving left temporal lobe, left frontal lobe and left opercular/sub insular region.  Involvement of the left lenticular nucleus/left corona radiata.  No intracranial hemorrhage.  Local mass effect without midline shift.  Remote left occipital lobe infarct with encephalomalacia.  Mild small vessel disease type changes.  Global atrophy without hydrocephalus.  No intracranial mass lesion detected on this unenhanced exam.  Limited evaluation of the intracranial vascular structures secondary to the motion.  Thrombus within the left middle cerebral artery suspected with occluded branches distal to this region.   IMPRESSION: Motion degraded exam.  Large left middle cerebral artery distribution infarct as noted above.  Thrombus within the left middle cerebral artery suspected with occluded branches distal to this region.  This has been made a PRA call report utilizing dashboard call feature.   Original Report Authenticated By: Lacy Duverney, M.D.     Medications: Scheduled Meds: . aspirin  300 mg Rectal Daily   Or  . aspirin  325 mg Oral Daily  . free water  150 mL Per Tube Q6H  . ipratropium  0.5 mg Nebulization BID  . levofloxacin (LEVAQUIN) IV  750 mg Intravenous Q24H  . levothyroxine  37.5 mcg Intravenous QAC breakfast  . metoprolol  2.5 mg Intravenous Q12H  . pantoprazole (PROTONIX) IV  40 mg Intravenous QHS  . sodium chloride  3 mL Intravenous Q12H      LOS: 6 days   [facility] S M.D. [facility] 07/27/2012, 1:41 PM  Pager: [phone]  If 7PM-7AM, please contact night-coverage [email or web address] Password [email or web address]

## 2012-07-27 NOTE — Progress Notes (Signed)
ANTICOAGULATION CONSULT NOTE - Initial Consult  Pharmacy Consult for Heparin Indication: stroke  Allergies  Allergen Reactions  . Hyoscyamine   . Ivp Dye (Iodinated Diagnostic Agents)   . Septra (Sulfamethoxazole W-Trimethoprim)     Patient Measurements: Height: 5\' 6"  (167.6 cm) Weight: 188 lb 9.6 oz (85.548 kg) IBW/kg (Calculated) : 59.3  Labs:  Recent Labs  07/25/12 0515  CREATININE 0.79    Estimated Creatinine Clearance: 57.7 ml/min (by C-G formula based on Cr of 0.79).   Medical History: Past Medical History  Diagnosis Date  . Hyperthyroidism     had RAI-is on thyroid replacement, happened maybe this summer  . Coronary artery disease     Had a catheterization per son by Dr. Glennon Brady in 1991 no reports in  [place]  . GERD (gastroesophageal reflux disease)     Assessment: Jenny Brady is an 79 y.o. female with a past medical history significant for dementia, CAD, CHF, hyperthyroidism, transferred from [facility] for further management of a large left MCA distribution infarct.  ? Thrombus on TEE.    Beginning IV heparin stroke protocol  Goal of Therapy:  Heparin level 0.3-0.5 units/ml Monitor platelets by anticoagulation protocol: Yes   Plan:  1) No heparin bolus 2) Heparin drip at 850 units / hr 3) Heparin level 8 hours after heparin begins 4) Daily heparin level, CBC  Thank you. Jenny Brady, PharmD [phone]  07/27/2012,2:17 PM

## 2012-07-27 NOTE — Progress Notes (Addendum)
heparin drip started at 1520 at 8.5 ml/hr  Minor, [name]

## 2012-07-27 NOTE — Progress Notes (Signed)
Speech Language Pathology Dysphagia Treatment Patient Details Name: TALAYSHA FREEBERG MRN: [id number] DOB: 03-Jan-1926 Today's Date: 07/27/2012 Time: 4098-1191 SLP Time Calculation (min): 8 min  Assessment / Plan / Recommendation Clinical Impression  Pt. seen today for determination of po appropriateness.  Pt. sleeping upon SLP arrival and able to arouse moderately with max verbal/visual/tactile cues.  Attempted but unable to clean oral cavity due to pt. consistent refusal turning head.  She also refused ice chip given max verbal/tactile/visual assist.  SLP placed ice chip to pt.'s lip with consistent and very intentional gesture of moving head away from spoon and using left hand to divert spoon (repeated attempts)  No family currently present.  RN stated that pt.'s son would try to speak with her in [place].  Panda placed today.  SLP will continue efforts.    Diet Recommendation  Continue with Current Diet: NPO    SLP Plan Continue with current plan of care   Pertinent Vitals/Pain No indications   Swallowing Goals  SLP Swallowing Goals Goal #3: Pt will accept ice chips within 10-15 seconds to aid in determining readiness for po with total cues.   Swallow Study Goal #3 - Progress: Progressing toward goal Goal #4: Pt's family will verbalize aspiration precautions with moderate assistance to mitigate aspiration risk.   Swallow Study Goal #4 - Progress: Not met  General Temperature Spikes Noted: No Respiratory Status: Room air Behavior/Cognition: Lethargic;Decreased sustained attention Oral Cavity - Dentition: Adequate natural dentition Patient Positioning: Upright in chair  Oral Cavity - Oral Hygiene Does patient have any of the following "at risk" factors?: Nutritional status - inadequate Brush patient's teeth BID with toothbrush (using toothpaste with fluoride): Yes Patient is HIGH RISK - Oral Care Protocol followed (see row info): Yes Patient is AT RISK - Oral Care Protocol followed  (see row info): Yes   Dysphagia Treatment Treatment focused on: Skilled observation of diet tolerance Treatment Methods/Modalities: Skilled observation Patient observed directly with PO's: No Reason PO's not observed: Refused Type of cueing: Verbal;Tactile;Visual Amount of cueing: Maximal   GO     Breck Coons [employer] [phone]  07/27/2012

## 2012-07-27 NOTE — Progress Notes (Signed)
Spoke to RD Joaquin Courts who said it is fine to start patient with new panda at 50 cc/hr  Spoke with Aurther Loft from Radiology who said panda was in an appropriate position to resume feeds  Jenny Brady, Jenny Brady

## 2012-07-28 LAB — CBC
Platelets: 243 10*3/uL (ref 150–400)
RBC: 4.8 MIL/uL (ref 3.87–5.11)
WBC: 7.1 10*3/uL (ref 4.0–10.5)

## 2012-07-28 LAB — GLUCOSE, CAPILLARY
Glucose-Capillary: 149 mg/dL — ABNORMAL HIGH (ref 70–99)
Glucose-Capillary: 153 mg/dL — ABNORMAL HIGH (ref 70–99)

## 2012-07-28 LAB — HEPARIN LEVEL (UNFRACTIONATED): Heparin Unfractionated: 0.55 IU/mL (ref 0.30–0.70)

## 2012-07-28 MED ORDER — ALBUTEROL SULFATE (5 MG/ML) 0.5% IN NEBU: 2.5000 mg | INHALATION_SOLUTION | RESPIRATORY_TRACT | Status: DC | PRN

## 2012-07-28 NOTE — Progress Notes (Signed)
ANTICOAGULATION CONSULT NOTE - Follow Up Consult  Pharmacy Consult for Heparin Indication: stroke  Allergies  Allergen Reactions  . Hyoscyamine   . Ivp Dye (Iodinated Diagnostic Agents)   . Septra (Sulfamethoxazole W-Trimethoprim)     Patient Measurements: Height: 5\' 6"  (167.6 cm) Weight: 188 lb 9.6 oz (85.548 kg) IBW/kg (Calculated) : 59.3 Heparin dosing weight: 78 kg  Labs:  Recent Labs  07/27/12 2235 07/28/12 0751 07/28/12 1525  HGB  --  11.4*  --   HCT  --  35.9*  --   PLT  --  243  --   HEPARINUNFRC <0.10* 0.55 0.61    Estimated Creatinine Clearance: 57.7 ml/min (by C-G formula based on Cr of 0.79).  Assessment: Jenny Brady is an 79 y.o. female with a past medical history significant for dementia, CAD, CHF, hyperthyroidism, transferred from [facility] for further management of a large left MCA distribution infarct.  ? Thrombus on TEE on IV heparin stroke protocol. Cards feels like intracardiac mass is a mobile thrombus with high risk of recurrent embolism.  She made need a PEG so no oral anticoagulant for now.   Heparin level = 0.61  Goal of Therapy:  Heparin level 0.3-0.5 units/ml Monitor platelets by anticoagulation protocol: Yes   Plan:  1.Decrease heparin to 1000 units/ hr 2. Follow up AM heparin level, CBC  Thank you. [name], PharmD [phone]  07/28/2012 4:04 PM .

## 2012-07-28 NOTE — Progress Notes (Signed)
Patient ID: Jenny Brady  female  ZOX:[id number]    DOB: 07-19-1925    DOA: 07/21/2012  PCP: [name], MD  Assessment/Plan:    Acute ischemic large left MCA stroke:  - CT head was obtained which showed large left MCA distribution infarct, most likely embolic. Not a candidate for thrombolytics due to unknown time of onset, delayed presentation. - Continue aspirin PR until taking oral - MRI of the brain showed large left hemispheric middle cerebral artery distribution infarct involving left temporal lobe, left frontal lobe and left opercular/sub insular region, involvement of the left lenticular nucleus/left corona radiata with local mass effect. Thrombus within the left middle cerebral artery suspected with occluded branches distal to this region. - PT, OT, recommending skilled nursing facility - 2-D echo showed EF of 15%, severe tricuspid regurgitation, moderate PHTN, PAP 62, moderate MR -  carotid Dopplers showed no ICA stenosis, antegrade vertebral flow  - TEE  Showed possible intracardiac thrombus, called and discussed with cardiology and  Dr Shirlee Latch feels that the patient does have intracardiac thrombus.   Dr Amada Jupiter (neurology) has discussed with cardiology and they all feel that patient should be started on heparin protocol as per pharmacy. Will order the heparin  stroke protocol.  - Failed swallow eval, placed nasogastric feeding tube, follow serial swallow evaluations, PEG tube if fails swallow evaluation, will need to address this with the son again as he did not make any formal decision about the PEG tube . Son has declined palliative care last week     DEMENTIA-mod-severe: Unknown baseline however patient is somewhat alert but not following any commands with new large left MCA stroke   Hyperthyroidism-s/p RAI admin:  Now hypothyroidism, TSH 13.9 - On IV Synthroid 37.5 mcg daily.    Coronary artery disease with acute on chronic systolic CHF (EF was 25-30% in  2012) - 2-D echo shows EF of 15% , BP currently borderline low - Will hold her Lasix to avoid hypotension in view of acute CVA  CAP (community acquired pneumonia)- left lung -Currently on IV Levaquin, nebs     UTI (lower urinary tract infection):  - Urine culture showed no growth  DVT Prophylaxis: SCD's  Code Status:  Full code  Disposition: Per patient's son, Jenny Brady  Goals of care  1) he wants her to be FULL CODE STATUS and continue aggressive care "to keep her alive and going" 2) okay for artificial feeding temporarily but no decision regarding permanent feeding PEG tube: if fails swallow evaluation, will need to address this with the son again as he did not make any formal decision about the PEG tube . Son has declined palliative care last week    Subjective: Patient seen, somnolent, but opens eyes to verbal stimuli  Objective: Weight change:   Intake/Output Summary (Last 24 hours) at 07/28/12 1610 Last data filed at 07/28/12 1605  Gross per 24 hour  Intake      0 ml  Output   1915 ml  Net  -1915 ml   Blood pressure 117/60, pulse 73, temperature 98.3 F (36.8 C), temperature source Axillary, resp. rate 18, height 5\' 6"  (1.676 m), weight 85.548 kg (188 lb 9.6 oz), SpO2 98.00%.  Physical Exam: General: Very lethargic, barely rousable to verbal stimuli NG tube in place CVS: S1-S2 clear Chest: CTAB Abdomen: obese, soft, nontender Extremities: no c/c/e Neuro: Opens eyes to verbal stimuli but does not follow commands  Lab Results: Basic Metabolic Panel:  Recent Labs Lab  07/24/12 0455 07/25/12 0515  NA 139 135  K 4.2 4.0  CL 102 101  CO2 31 28  GLUCOSE 110* 105*  BUN 18 15  CREATININE 0.89 0.79  CALCIUM 8.3* 8.5   Liver Function Tests:  Recent Labs Lab 07/21/12 1635  AST 16  ALT 12  ALKPHOS 107  BILITOT 0.6  PROT 6.4  ALBUMIN 2.7*   CBC:  Recent Labs Lab 07/21/12 1635 07/21/12 1732 07/28/12 0751  WBC 6.3  --  7.1  NEUTROABS 4.4  --    --   HGB 10.7* 12.2 11.4*  HCT 35.2* 36.0 35.9*  MCV 77.7*  --  74.8*  PLT 308  --  243   Cardiac Enzymes:  Recent Labs Lab 07/22/12 0032 07/22/12 0530 07/22/12 1207  TROPONINI <0.30 <0.30 <0.30   BNP: No components found with this basename: POCBNP,  CBG:  Recent Labs Lab 07/27/12 1931 07/27/12 2339 07/28/12 0340 07/28/12 0806 07/28/12 1149  GLUCAP 134* 132* 149* 153* 142*     Micro Results: Recent Results (from the past 240 hour(s))  URINE CULTURE     Status: None   Collection Time    07/21/12  5:34 PM      Result Value Range Status   Specimen Description URINE, CATHETERIZED   Final   Special Requests NONE   Final   Culture  Setup Time 07/21/2012 21:22   Final   Colony Count NO GROWTH   Final   Culture NO GROWTH   Final   Report Status 07/22/2012 FINAL   Final    Studies/Results: Dg Chest 1 View  07/21/2012  *RADIOLOGY REPORT*  Clinical Data: Shortness of breath and wheezing.  Altered mental status.  CHEST - 1 VIEW  Comparison: PA and lateral chest 07/11/2011.  Findings: There is cardiomegaly and pulmonary edema.  Small left effusion and basilar airspace disease are also identified.  IMPRESSION:  1.  Cardiomegaly and pulmonary edema. 2.  Small left pleural effusion and basilar airspace disease which could be due to atelectasis or pneumonia.   Original Report Authenticated By: Holley Dexter, M.D.    Dg Chest 2 View  07/22/2012  *RADIOLOGY REPORT*  Clinical Data: Evaluate for infiltrate  CHEST - 2 VIEW  Comparison: 07/21/2012  Findings: Moderate cardiac enlargement.  There is atelectasis and airspace consolidation noted within the left lower lobe.  Mild diffuse interstitial edema is noted.  No new findings identified.  IMPRESSION:  1.  Persistent pulmonary edema. 2.  No change in aeration to the left lower lobe.   Original Report Authenticated By: Signa Kell, M.D.    Ct Head Wo Contrast  07/21/2012  *RADIOLOGY REPORT*  Clinical Data: Altered mental status.   The patient is unresponsive.  CT HEAD WITHOUT CONTRAST  Technique:  Contiguous axial images were obtained from the base of the skull through the vertex without contrast.  Comparison: 02/09/2008  Findings: The patient has an acute large non hemorrhagic left middle cerebral artery distribution infarct with slight edema.  No midline shift at this time. There is a hyper dense branch of the left middle cerebral artery seen on image number 11 of series 2, consistent with acute thrombosis.  There is an old left occipital infarct, new since 02/09/2008.  Diffuse cerebral cortical atrophy, most prominent in the temporal lobes.  No significant osseous abnormality.  IMPRESSION:  1.  Large nonhemorrhagic left middle cerebral artery infarct with early edema without midline shift. 2.  Old left occipital infarct. 3.  Atrophy, most prominent  in the temporal lobes.   Original Report Authenticated By: Francene Boyers, M.D.    Mr Brain Wo Contrast  07/22/2012  *RADIOLOGY REPORT*  Clinical Data: Altered mental status.  Abnormal CT.  MRI HEAD WITHOUT CONTRAST  Technique:  Multiplanar, multiecho pulse sequences of the brain and surrounding structures were obtained according to standard protocol without intravenous contrast.  Comparison: 07/21/2012 CT.  03/08/2004 MR.  Findings: Motion degraded exam.  MR angiogram was ordered although the patient not able to cooperate for this exam.  Large left hemispheric middle cerebral artery distribution infarct involving left temporal lobe, left frontal lobe and left opercular/sub insular region.  Involvement of the left lenticular nucleus/left corona radiata.  No intracranial hemorrhage.  Local mass effect without midline shift.  Remote left occipital lobe infarct with encephalomalacia.  Mild small vessel disease type changes.  Global atrophy without hydrocephalus.  No intracranial mass lesion detected on this unenhanced exam.  Limited evaluation of the intracranial vascular structures secondary to  the motion.  Thrombus within the left middle cerebral artery suspected with occluded branches distal to this region.  IMPRESSION: Motion degraded exam.  Large left middle cerebral artery distribution infarct as noted above.  Thrombus within the left middle cerebral artery suspected with occluded branches distal to this region.  This has been made a PRA call report utilizing dashboard call feature.   Original Report Authenticated By: Lacy Duverney, M.D.     Medications: Scheduled Meds: . aspirin  300 mg Rectal Daily   Or  . aspirin  325 mg Oral Daily  . free water  150 mL Per Tube Q6H  . levofloxacin (LEVAQUIN) IV  750 mg Intravenous Q24H  . levothyroxine  37.5 mcg Intravenous QAC breakfast  . metoprolol  2.5 mg Intravenous Q12H  . pantoprazole (PROTONIX) IV  40 mg Intravenous QHS  . sodium chloride  3 mL Intravenous Q12H      LOS: 7 days   [facility] S M.D. [facility] 07/28/2012, 4:10 PM  Pager: [phone]  If 7PM-7AM, please contact night-coverage [email or web address] Password [email or web address]

## 2012-07-28 NOTE — Clinical Social Work Psychosocial (Signed)
Clinical Social Work Department BRIEF PSYCHOSOCIAL ASSESSMENT 07/28/2012  Patient:  Jenny, Brady     Account Number:  [id number]     Admit date:  07/21/2012  Clinical Social Worker:  Skip Mayer  Date/Time:  07/28/2012 08:00 AM  Referred by:  Physician  Date Referred:  07/28/2012 Referred for  SNF Placement   Other Referral:   Interview type:  Family Other interview type:   Jenny Brady/son (c) [phone]    PSYCHOSOCIAL DATA Living Status:  ALONE Admitted from facility:   Level of care:   Primary support name:  Jenny Brady Primary support relationship to patient:  CHILD, ADULT Degree of support available:   Adequate    CURRENT CONCERNS Current Concerns  Post-Acute Placement   Other Concerns:    SOCIAL WORK ASSESSMENT / PLAN CSw spoke with pt's son, Jenny Brady [phone], re: anticipated need for SNF.  CSW explained placement process and answered questions.  Pt's son requesting [facility] SNF search.  CSW will initiate SNF search.   Assessment/plan status:  Information/Referral to [facility] Other assessment/ plan:   Information/referral to community resources:   SNF  PTAR    PATIENT'S/FAMILY'S RESPONSE TO PLAN OF CARE: Pt's son reports agreeable to SNF in order to maximize pt's strength and independence with mobility.  Pt's son verbalized understanding of placement process and appreciation for CSW assist.        Dellie Burns, MSW, LCSWA [phone] (Weekends 8:00am-4:30pm)

## 2012-07-28 NOTE — Progress Notes (Signed)
Stroke Team Progress Note  HISTORY Jenny Brady is an 79 y.o. female with a past medical history significant for dementia, CAD, CHF, hyperthyroidism, transferred from [facility] for further management of a large left MCA distribution infarct. Patient is not able to provide information and thus all clinical data is gathered from patient's medical record. She appaently lives by herself and on 07/21/2012 she was found by her son unresponsive with some respiratory distress and was taken to [facility] where she was found to be poorly responsive and unable to move the right side. An emergent CT brain revealed a large area of decreased attenuation in the left MCA distribution suggestive of an ischemic infarct. Transferred to [facility] for further management. Patient was not a TPA candidate secondary to unknown time of symptom onset.  SUBJECTIVE No family members present. The patient is mute. She does moan when touched or disturbed.  OBJECTIVE Most recent Vital Signs: Filed Vitals:   07/27/12 2200 07/27/12 2218 07/28/12 0200 07/28/12 0600  BP: 121/79  129/65 111/59  Pulse: 80  86 81  Temp: 97.8 F (36.6 C)  97.8 F (36.6 C) 97.8 F (36.6 C)  TempSrc: Axillary  Axillary Oral  Resp: 20  20 20   Height:      Weight:      SpO2: 98% 98% 97% 97%   CBG (last 3)   Recent Labs  07/27/12 2339 07/28/12 0340 07/28/12 0806  GLUCAP 132* 149* 153*   IV Fluid Intake:   . sodium chloride    . dextrose 5 % and 0.9% NaCl 50 mL/hr at 07/26/12 2048  . feeding supplement (JEVITY 1.2 CAL) 1,000 mL (07/28/12 0506)  . heparin 1,150 Units/hr (07/27/12 2352)   MEDICATIONS  . aspirin  300 mg Rectal Daily   Or  . aspirin  325 mg Oral Daily  . free water  150 mL Per Tube Q6H  . ipratropium  0.5 mg Nebulization BID  . levofloxacin (LEVAQUIN) IV  750 mg Intravenous Q24H  . levothyroxine  37.5 mcg Intravenous QAC breakfast  . metoprolol  2.5 mg Intravenous Q12H  . pantoprazole (PROTONIX) IV  40 mg Intravenous QHS  . sodium  chloride  3 mL Intravenous Q12H   PRN:  sodium chloride, acetaminophen, acetaminophen, albuterol, ondansetron (ZOFRAN) IV, sodium chloride  Diet:  NPO  Activity:  OOB with assistance DVT Prophylaxis:  SCDs   CLINICALLY SIGNIFICANT STUDIES Basic Metabolic Panel:   Recent Labs Lab 07/24/12 0455 07/25/12 0515  NA 139 135  K 4.2 4.0  CL 102 101  CO2 31 28  GLUCOSE 110* 105*  BUN 18 15  CREATININE 0.89 0.79  CALCIUM 8.3* 8.5   Liver Function Tests:   Recent Labs Lab 07/21/12 1635  AST 16  ALT 12  ALKPHOS 107  BILITOT 0.6  PROT 6.4  ALBUMIN 2.7*   CBC:   Recent Labs Lab 07/21/12 1635 07/21/12 1732  WBC 6.3  --   NEUTROABS 4.4  --   HGB 10.7* 12.2  HCT 35.2* 36.0  MCV 77.7*  --   PLT 308  --    Coagulation: No results found for this basename: LABPROT, INR,  in the last 168 hours Cardiac Enzymes:   Recent Labs Lab 07/22/12 0032 07/22/12 0530 07/22/12 1207  TROPONINI <0.30 <0.30 <0.30   Urinalysis:   Recent Labs Lab 07/21/12 1734  COLORURINE YELLOW  LABSPEC 1.015  PHURINE 7.5  GLUCOSEU NEGATIVE  HGBUR NEGATIVE  BILIRUBINUR NEGATIVE  KETONESUR NEGATIVE  PROTEINUR 30*  UROBILINOGEN  1.0  NITRITE NEGATIVE  LEUKOCYTESUR SMALL*   Lipid Panel    Component Value Date/Time   CHOL 136 07/22/2012 0032   TRIG 93 07/22/2012 0032   HDL 27* 07/22/2012 0032   CHOLHDL 5.0 07/22/2012 0032   VLDL 19 07/22/2012 0032   LDLCALC 90 07/22/2012 0032   HgbA1C  Lab Results  Component Value Date   HGBA1C 7.3* 07/22/2012   Urine Drug Screen:   No results found for this basename: labopia,  cocainscrnur,  labbenz,  amphetmu,  thcu,  labbarb    Alcohol Level: No results found for this basename: ETH,  in the last 168 hours  CT of the brain  07/21/2012   1.  Large nonhemorrhagic left middle cerebral artery infarct with early edema without midline shift. 2.  Old left occipital infarct. 3.  Atrophy, most prominent in the temporal lobes.   MRI of the brain  07/22/2012   Motion degraded exam.  Large left middle cerebral artery distribution infarct as noted above.  Thrombus within the left middle cerebral artery suspected with occluded branches distal to this region.    MRA of the brain   1. Left MCA occluded or nearly occluded in the M1 segment just  beyond its origin. This corresponds to the earlier MRI findings.  Faint more distal left MCA flow signal; little to no M2 segment  flow signal.  2. Moderately degraded by motion despite repeated imaging  attempts.   2D Echocardiogram  EF 15% with no source of embolus.   Carotid Doppler  No evidence of hemodynamically significant internal carotid artery stenosis. Vertebral artery flow is antegrade.   TEE 07/26/2012 - ejection fraction 20-25%. There was a small mobile structure attached to the anterior wall of the LV. This may have been a loose trabeculation but cannot rule out thrombus given history."Would consider anticoagulation if she would be a safe candidate"   CXR   07/22/2012    1.  Persistent pulmonary edema. 2.  No change in aeration to the left lower lobe.  07/21/2012  1.  Cardiomegaly and pulmonary edema. 2.  Small left pleural effusion and basilar airspace disease which could be due to atelectasis or pneumonia.   EKG  normal sinus rhythm, LBBB.   Therapy Recommendations CIR, CIR recommends SNF  Physical Exam   General - 79 year old female lying in bed mute with left gaze preference. NG tube now back in place. Heart - Regular with ectopics Lungs - Clear to auscultation anteriorly. Abdomen - Soft - non tender Skin - Warm and dry  The patient is lethargic this morning,  non-verbal. The patient has a pain response with passive movement of any extremity.  Obese. 1 to 2 plus edema at the ankles.  Left gaze preference, and no blink to threat from the right.   Right hemiparesis, arm> leg, severe. Occasional spontaneous movements of the right foot . Will move the left side. The patient is mute,  but will follow command to wiggle toes.   Could not ambulate, and the patient will not follow commands for cerebellar testing.  DTR are depressed, symmetric, right babinski.   ASSESSMENT [name] is a 79 y.o. demented female presenting with unresponsiveness and respiratory distress after being found down at home. Imaging confirms a left large MCA infarct. Infarct felt to be embolic secondary to unknown etiology (most common etiology in this age group is atrial fibrillation).  On aspirin 81 mg orally every day prior to admission. Now on aspirin suppository  for secondary stroke prevention. Patient with resultant right hemiparesis, global aphasia, dysphagia. Work up completed.    CAD  LDL 90 - not on any statin medication prior to admission.   HgbA1c 7.3  Severe dysphagia, failed swallow evaluation. NG-tube was been replaced.  Morbid obesity  Baseline dementia  Community acquired pneumonia, on levaquin  UTI, on levaquin  Cardiomyopathy with EF 20-25% by TEE.  Likely obstructive sleep apnea  TEE as above.   Hospital day # 7  TREATMENT/PLAN  Continue aspirin suppository for secondary stroke prevention. Consider anticoagulation as above.  Repeat swallow eval yesterday - the patient is to remain n.p.o. She is on NG tube feedings. PEG to be considered. Await family's decision. SNF at discharge The patient is now on IV heparin for a possible cardiac thrombus as noted on TEE. Can hold heparin if PEG is to be placed.  Delton See PA-C [facility] Pager [phone] 07/28/2012, 8:17 AM  79 yo F with L MCA infarct. Palliative care consulted earlier in hospitalization, but refused by son. Currently awaiting improvement in swallow function vs. PEG. ST going to reeval tomorrow and decision will be made at that time. On stroke protocol heparin given intracardiac thrombus as I feel that this is a very high risk situation and she is now 7 days out from  stroke. There still remains a distinct possibility of hemorrhagic conversion, but at this time, feel that the risk of recurrent embolization is high enough to warrant anticoagulation.   Ritta Slot, MD [facility] [phone]  If 7pm- 7am, please page neurology on call at [phone].

## 2012-07-28 NOTE — Progress Notes (Signed)
Spoke to son this morning who said patient's first and most comfortable language is [place] (not spanish as mentioned earlier in epic). He says she can speak [place] but [place] is her primary language  Minor, [name]

## 2012-07-28 NOTE — Clinical Social Work Placement (Addendum)
Clinical Social Work Department CLINICAL SOCIAL WORK PLACEMENT NOTE 07/28/2012  Patient:  Jenny, Brady  Account Number:  [id number] Admit date:  07/21/2012  Clinical Social Worker:  Skip Mayer  Date/time:  07/28/2012 09:00 AM  Clinical Social Work is seeking post-discharge placement for this patient at the following level of care:   SKILLED NURSING   (*CSW will update this form in Epic as items are completed)   07/28/2012  Patient/family provided with [facility] Department of Clinical Social Work's list of facilities offering this level of care within the geographic area requested by the patient (or if unable, by the patient's family).  07/28/2012  Patient/family informed of their freedom to choose among providers that offer the needed level of care, that participate in Medicare, Medicaid or managed care program needed by the patient, have an available bed and are willing to accept the patient.  07/28/2012  Patient/family informed of [facility]' ownership interest in [facility], as well as of the fact that they are under no obligation to receive care at this facility.  PASARR submitted to EDS on  PASARR number received from EDS on   FL2 transmitted to all facilities in geographic area requested by pt/family on  07/28/2012 FL2 transmitted to all facilities within larger geographic area on   Patient informed that his/her managed care company has contracts with or will negotiate with  certain facilities, including the following:     Patient/family informed of bed offers received:  07/30/2012 Patient chooses bed at [facility] on 08/12/2012 Physician recommends and patient chooses bed at  SNF  Patient to be transferred to [facility] on 08/12/2012 Patient to be transferred to facility by [facility]  The following physician request were entered in Epic:   Additional Comments:

## 2012-07-28 NOTE — Progress Notes (Signed)
ANTICOAGULATION CONSULT NOTE - Follow Up Consult  Pharmacy Consult for Heparin Indication: stroke  Allergies  Allergen Reactions  . Hyoscyamine   . Ivp Dye (Iodinated Diagnostic Agents)   . Septra (Sulfamethoxazole W-Trimethoprim)     Patient Measurements: Height: 5\' 6"  (167.6 cm) Weight: 188 lb 9.6 oz (85.548 kg) IBW/kg (Calculated) : 59.3 Heparin dosing weight: 78 kg  Labs:  Recent Labs  07/27/12 2235 07/28/12 0751  HGB  --  11.4*  HCT  --  35.9*  PLT  --  243  HEPARINUNFRC <0.10* 0.55    Estimated Creatinine Clearance: 57.7 ml/min (by C-G formula based on Cr of 0.79).  Assessment: Jenny Brady is an 79 y.o. female with a past medical history significant for dementia, CAD, CHF, hyperthyroidism, transferred from [facility] for further management of a large left MCA distribution infarct.  ? Thrombus on TEE on IV heparin stroke protocol. Cards feels like intracardiac mass is a mobile thrombus with high risk of recurrent embolism.  She made need a PEG so no oral anticoagulant for now.   Heparin level (0.55) is at high end of goal after rate increased to 1150 units/hr from  850 units/hr. No problem with line / infusion and no bleeding per RN.  Hg 11.4 from 12.2.   Goal of Therapy:  Heparin level 0.3-0.5 units/ml Monitor platelets by anticoagulation protocol: Yes   Plan:  1. continue IV heparin at 1150 units/hr 2. Recheck Heparin level in 8 hours to confirm per protocol Herby Abraham, Pharm.D. [phone] 07/28/2012 9:20 AM .

## 2012-07-28 NOTE — Progress Notes (Signed)
RN called a [place] interpreter out of curiosity if patient would respond more if spoken to in her native language. Patient became quite animated and made many incomprehensible noises while listening to the interpreter and even nodded a few times. I had them tell her " i am your nurse, I am here for you. I know you might be scared, but we are taking care of you. Can you hear me?" the interpreter said that it even sounded as if the patient began to respond with the [place] word for "hear." So patient may be more aware than initially thought, and more of a language barrier may exist than previously indicated.   Minor, [name]

## 2012-07-29 ENCOUNTER — Encounter (HOSPITAL_COMMUNITY): Payer: Self-pay | Admitting: Cardiology

## 2012-07-29 DIAGNOSIS — R1310 Dysphagia, unspecified: Secondary | ICD-10-CM | POA: Diagnosis present

## 2012-07-29 DIAGNOSIS — F0391 Unspecified dementia with behavioral disturbance: Secondary | ICD-10-CM | POA: Diagnosis present

## 2012-07-29 LAB — CBC
HCT: 35.5 % — ABNORMAL LOW (ref 36.0–46.0)
MCH: 23.1 pg — ABNORMAL LOW (ref 26.0–34.0)
MCHC: 31 g/dL (ref 30.0–36.0)
MCV: 74.6 fL — ABNORMAL LOW (ref 78.0–100.0)
RDW: 15.5 % (ref 11.5–15.5)
WBC: 5.4 10*3/uL (ref 4.0–10.5)

## 2012-07-29 LAB — GLUCOSE, CAPILLARY
Glucose-Capillary: 134 mg/dL — ABNORMAL HIGH (ref 70–99)
Glucose-Capillary: 142 mg/dL — ABNORMAL HIGH (ref 70–99)
Glucose-Capillary: 144 mg/dL — ABNORMAL HIGH (ref 70–99)
Glucose-Capillary: 164 mg/dL — ABNORMAL HIGH (ref 70–99)

## 2012-07-29 NOTE — Progress Notes (Signed)
ANTICOAGULATION CONSULT NOTE - Follow Up Consult  Pharmacy Consult for heparin Indication:  Intracardiac thrombus  Allergies  Allergen Reactions  . Hyoscyamine   . Ivp Dye (Iodinated Diagnostic Agents)   . Septra (Sulfamethoxazole W-Trimethoprim)     Patient Measurements: Height: 5\' 6"  (167.6 cm) Weight: 184 lb 15.5 oz (83.9 kg) IBW/kg (Calculated) : 59.3 Heparin Dosing Weight: 78  Vital Signs: Temp: 97.4 F (36.3 C) (04/21 0953) Temp src: Oral (04/21 0953) BP: 99/48 mmHg (04/21 0953) Pulse Rate: 67 (04/21 0953)  Labs:  Recent Labs  07/28/12 0751 07/28/12 1525 07/29/12 0610  HGB 11.4*  --  11.0*  HCT 35.9*  --  35.5*  PLT 243  --  246  HEPARINUNFRC 0.55 0.61 0.39    Estimated Creatinine Clearance: 57.1 ml/min (by C-G formula based on Cr of 0.79).  Assessment: Patient is an 79 y.o F with new CVA and now on heparin therapy for suspected intracardiac thrombus.  Heparin level is at goal with 0.39 this morning.  CBC stable with no documented bleeding.  Goal of Therapy:  Heparin level 0.3-0.5 units/ml Monitor platelets by anticoagulation protocol: Yes   Plan:  1) no change for heparin  Nalany Steedley P 07/29/2012,10:22 AM

## 2012-07-29 NOTE — Progress Notes (Addendum)
,Patient ID: Jenny Brady  female  HQI:[id number]    DOB: 1925-08-24    DOA: 07/21/2012  PCP: [name], MD  Interim Summary: 80 yo female w/hx pf CAD, dementia came in on 4/13 for resp distress & R facial droop/hemiparesis.  Pt's workup found to have large left MCA CVA secondary to possible intracardiac thrombus.  She also failed swallow eval.  Assessment/Plan:    Acute ischemic large left MCA stroke:  - CT head was obtained which showed large left MCA distribution infarct, most likely embolic. Not a candidate for thrombolytics due to unknown time of onset, delayed presentation. - Continue aspirin PR until taking oral - MRI of the brain showed large left hemispheric middle cerebral artery distribution infarct involving left temporal lobe, left frontal lobe and left opercular/sub insular region, involvement of the left lenticular nucleus/left corona radiata with local mass effect. Thrombus within the left middle cerebral artery suspected with occluded branches distal to this region. - PT, OT, recommending skilled nursing facility - 2-D echo showed EF of 15%, severe tricuspid regurgitation, moderate PHTN, PAP 62, moderate MR -  carotid Dopplers showed no ICA stenosis, antegrade vertebral flow  - TEE  Showed possible intracardiac thrombus, called and discussed with cardiology and  Dr Shirlee Latch feels that the patient does have intracardiac thrombus.   Dr Amada Jupiter (neurology) has discussed with cardiology and they all feel that patient should be started on heparin protocol as per pharmacy. Will order the heparin  stroke protocol.  - Failed swallow eval, placed nasogastric feeding tube, follow serial swallow evaluations, patient had repeat swallow evaluation done today. Speech therapy recommending dysphasia 1. Diet with pudding thick liquids. However, she will need for supervision, so her son has been asked to come by and see if he is able to give her a meal. We'll be able to decide in  the next 24 hours on PEG tube versus restricted diet.      DEMENTIA-mod-severe: Unknown baseline however patient is somewhat alert but not following any commands with new large left MCA stroke   Hyperthyroidism-s/p RAI admin:  Now hypothyroidism, TSH 13.9 - On IV Synthroid 37.5 mcg daily.    Coronary artery disease with acute on chronic systolic CHF (EF was 25-30% in 2012) - 2-D echo shows EF of 15% , BP currently borderline low - Will hold her Lasix to avoid hypotension in view of acute CVA  CAP (community acquired pneumonia)- left lung Patient has completed 8 days of antibiotics. Continue when necessary meds.     UTI (lower urinary tract infection):  - Urine culture showed no growth  DVT Prophylaxis: SCD's  Code Status:  Full code  Disposition: Per patient's son, Jenny Brady  Goals of care  1) he wants her to be FULL CODE STATUS and continue aggressive care "to keep her alive and going" 2) okay for artificial feeding temporarily but no decision regarding permanent feeding PEG tube: if fails swallow evaluation, will need to address this with the son again as he did not make any formal decision about the PEG tube . Son has declined palliative care last week    Subjective:  patient really nonverbal, but does open her eyes to me.   Objective: Weight change:   Intake/Output Summary (Last 24 hours) at 07/29/12 1647 Last data filed at 07/29/12 0646  Gross per 24 hour  Intake      0 ml  Output   1300 ml  Net  -1300 ml   Blood pressure  101/53, pulse 74, temperature 98 F (36.7 C), temperature source Oral, resp. rate 20, height 5\' 6"  (1.676 m), weight 83.9 kg (184 lb 15.5 oz), SpO2 100.00%.  Physical Exam: General: Very lethargic,  does open her eyes.  CVS: S1-S2 clear Chest: CTAB Abdomen: obese, soft, nontender Extremities: no c/c/e Neuro: Opens eyes to verbal stimuli but does not follow commands  Lab Results: Basic Metabolic Panel:  Recent Labs Lab  07/24/12 0455 07/25/12 0515  NA 139 135  K 4.2 4.0  CL 102 101  CO2 31 28  GLUCOSE 110* 105*  BUN 18 15  CREATININE 0.89 0.79  CALCIUM 8.3* 8.5   CBC:  Recent Labs Lab 07/28/12 0751 07/29/12 0610  WBC 7.1 5.4  HGB 11.4* 11.0*  HCT 35.9* 35.5*  MCV 74.8* 74.6*  PLT 243 246   CBG:  Recent Labs Lab 07/28/12 2104 07/29/12 0031 07/29/12 0355 07/29/12 0905 07/29/12 1138  GLUCAP 123* 134* 150* 164* 147*     Studies/Results: Dg Chest 1 View  07/21/2012   IMPRESSION:  1.  Cardiomegaly and pulmonary edema. 2.  Small left pleural effusion and basilar airspace disease which could be due to atelectasis or pneumonia.   Original Report Authenticated By: Holley Dexter, M.D.    Dg Chest 2 View  07/22/2012   IMPRESSION:  1.  Persistent pulmonary edema. 2.  No change in aeration to the left lower lobe.   Original Report Authenticated By: Signa Kell, M.D.    Ct Head Wo Contrast  07/21/2012  *RADIOLOGY REPORT*  Clinical Data: Altered mental status.  The patient is unresponsive.  CT HEAD WITHOUT CONTRAST  Technique:  Contiguous axial images were obtained from the base of the skull through the vertex without contrast.  Comparison: 02/09/2008  Findings: The patient has an acute large non hemorrhagic left middle cerebral artery distribution infarct with slight edema.  No midline shift at this time. There is a hyper dense branch of the left middle cerebral artery seen on image number 11 of series 2, consistent with acute thrombosis.  There is an old left occipital infarct, new since 02/09/2008.  Diffuse cerebral cortical atrophy, most prominent in the temporal lobes.  No significant osseous abnormality.  IMPRESSION:  1.  Large nonhemorrhagic left middle cerebral artery infarct with early edema without midline shift. 2.  Old left occipital infarct. 3.  Atrophy, most prominent in the temporal lobes.   Original Report Authenticated By: Francene Boyers, M.D.    Mr Brain Wo Contrast  07/22/2012   *RADIOLOGY REPORT*  Clinical Data: Altered mental status.  Abnormal CT.  MRI HEAD WITHOUT CONTRAST  Technique:  Multiplanar, multiecho pulse sequences of the brain and surrounding structures were obtained according to standard protocol without intravenous contrast.  Comparison: 07/21/2012 CT.  03/08/2004 MR.  Findings: Motion degraded exam.  MR angiogram was ordered although the patient not able to cooperate for this exam.  Large left hemispheric middle cerebral artery distribution infarct involving left temporal lobe, left frontal lobe and left opercular/sub insular region.  Involvement of the left lenticular nucleus/left corona radiata.  No intracranial hemorrhage.  Local mass effect without midline shift.  Remote left occipital lobe infarct with encephalomalacia.  Mild small vessel disease type changes.  Global atrophy without hydrocephalus.  No intracranial mass lesion detected on this unenhanced exam.  Limited evaluation of the intracranial vascular structures secondary to the motion.  Thrombus within the left middle cerebral artery suspected with occluded branches distal to this region.  IMPRESSION: Motion degraded exam.  Large left middle cerebral artery distribution infarct as noted above.  Thrombus within the left middle cerebral artery suspected with occluded branches distal to this region.  This has been made a PRA call report utilizing dashboard call feature.   Original Report Authenticated By: Lacy Duverney, M.D.     Medications: Scheduled Meds: . aspirin  300 mg Rectal Daily   Or  . aspirin  325 mg Oral Daily  . free water  150 mL Per Tube Q6H  . levofloxacin (LEVAQUIN) IV  750 mg Intravenous Q24H  . levothyroxine  37.5 mcg Intravenous QAC breakfast  . metoprolol  2.5 mg Intravenous Q12H  . pantoprazole (PROTONIX) IV  40 mg Intravenous QHS  . sodium chloride  3 mL Intravenous Q12H      LOS: 8 days   Hollice Espy M.D.  [facility]  07/29/2012, 4:47 PM  Pager: [phone]    If 7PM-7AM, please contact night-coverage [email or web address] Password [email or web address]

## 2012-07-29 NOTE — Progress Notes (Signed)
Stroke Team Progress Note  HISTORY Jenny Brady is an 79 y.o. female with a past medical history significant for dementia, CAD, CHF, hyperthyroidism, transferred from [facility] for further management of a large left MCA distribution infarct. Patient is not able to provide information and thus all clinical data is gathered from patient's medical record. She appaently lives by herself and on 07/21/2012 she was found by her son unresponsive with some respiratory distress and was taken to [facility] where she was found to be poorly responsive and unable to move the right side. An emergent CT brain revealed a large area of decreased attenuation in the left MCA distribution suggestive of an ischemic infarct. Transferred to [facility] for further management. Patient was not a TPA candidate secondary to unknown time of symptom onset.  SUBJECTIVE No family members present.   OBJECTIVE Most recent Vital Signs: Filed Vitals:   07/29/12 0127 07/29/12 0500 07/29/12 0548 07/29/12 0953  BP: 110/51  102/54 99/48  Pulse: 76  80 67  Temp: 98.8 F (37.1 C)  98.5 F (36.9 C) 97.4 F (36.3 C)  TempSrc: Axillary  Axillary Oral  Resp: 20  20 20   Height:      Weight:  83.9 kg (184 lb 15.5 oz)    SpO2: 98%  96% 96%   CBG (last 3)   Recent Labs  07/29/12 0355 07/29/12 0905 07/29/12 1138  GLUCAP 150* 164* 147*   IV Fluid Intake:   . sodium chloride    . dextrose 5 % and 0.9% NaCl 50 mL/hr at 07/26/12 2048  . feeding supplement (JEVITY 1.2 CAL) 60 mL/hr at 07/29/12 0135  . heparin 1,000 Units/hr (07/28/12 1608)   MEDICATIONS  . aspirin  300 mg Rectal Daily   Or  . aspirin  325 mg Oral Daily  . free water  150 mL Per Tube Q6H  . levofloxacin (LEVAQUIN) IV  750 mg Intravenous Q24H  . levothyroxine  37.5 mcg Intravenous QAC breakfast  . metoprolol  2.5 mg Intravenous Q12H  . pantoprazole (PROTONIX) IV  40 mg Intravenous QHS  . sodium chloride  3 mL Intravenous Q12H   PRN:  sodium chloride, acetaminophen,  acetaminophen, albuterol, ondansetron (ZOFRAN) IV, sodium chloride  Diet:  NPO  Activity:  OOB with assistance DVT Prophylaxis:  SCDs   CLINICALLY SIGNIFICANT STUDIES Basic Metabolic Panel:   Recent Labs Lab 07/24/12 0455 07/25/12 0515  NA 139 135  K 4.2 4.0  CL 102 101  CO2 31 28  GLUCOSE 110* 105*  BUN 18 15  CREATININE 0.89 0.79  CALCIUM 8.3* 8.5   Liver Function Tests:  No results found for this basename: AST, ALT, ALKPHOS, BILITOT, PROT, ALBUMIN,  in the last 168 hours CBC:   Recent Labs Lab 07/28/12 0751 07/29/12 0610  WBC 7.1 5.4  HGB 11.4* 11.0*  HCT 35.9* 35.5*  MCV 74.8* 74.6*  PLT 243 246   Coagulation: No results found for this basename: LABPROT, INR,  in the last 168 hours Cardiac Enzymes:  No results found for this basename: CKTOTAL, CKMB, CKMBINDEX, TROPONINI,  in the last 168 hours Urinalysis:  No results found for this basename: COLORURINE, APPERANCEUR, LABSPEC, PHURINE, GLUCOSEU, HGBUR, BILIRUBINUR, KETONESUR, PROTEINUR, UROBILINOGEN, NITRITE, LEUKOCYTESUR,  in the last 168 hours Lipid Panel    Component Value Date/Time   CHOL 136 07/22/2012 0032   TRIG 93 07/22/2012 0032   HDL 27* 07/22/2012 0032   CHOLHDL 5.0 07/22/2012 0032   VLDL 19 07/22/2012 0032   LDLCALC  90 07/22/2012 0032   HgbA1C  Lab Results  Component Value Date   HGBA1C 7.3* 07/22/2012   Urine Drug Screen:   No results found for this basename: labopia,  cocainscrnur,  labbenz,  amphetmu,  thcu,  labbarb    Alcohol Level: No results found for this basename: ETH,  in the last 168 hours  CT of the brain  07/21/2012   1.  Large nonhemorrhagic left middle cerebral artery infarct with early edema without midline shift. 2.  Old left occipital infarct. 3.  Atrophy, most prominent in the temporal lobes.   MRI of the brain  07/22/2012  Motion degraded exam.  Large left middle cerebral artery distribution infarct as noted above.  Thrombus within the left middle cerebral artery suspected with  occluded branches distal to this region.    MRA of the brain  1. Left MCA occluded or nearly occluded in the M1 segment just beyond its origin. This corresponds to the earlier MRI findings. Faint more distal left MCA flow signal; little to no M2 segment  flow signal. 2. Moderately degraded by motion despite repeated imaging  attempts.  2D Echocardiogram  EF 15% with no source of embolus.   Carotid Doppler  No evidence of hemodynamically significant internal carotid artery stenosis. Vertebral artery flow is antegrade.   TEE 07/26/2012 - ejection fraction 20-25%. There was a small mobile structure attached to the anterior wall of the LV. This may have been a loose trabeculation but cannot rule out thrombus given history."Would consider anticoagulation if she would be a safe candidate"  TCD    CXR   07/22/2012    1.  Persistent pulmonary edema. 2.  No change in aeration to the left lower lobe.  07/21/2012  1.  Cardiomegaly and pulmonary edema. 2.  Small left pleural effusion and basilar airspace disease which could be due to atelectasis or pneumonia.   EKG  normal sinus rhythm, LBBB.   Therapy Recommendations CIR, CIR recommends SNF  Physical Exam   General - 79 year old female lying in bed mute with left gaze preference. NG tube now back in place. Heart - Regular with ectopics Lungs - Clear to auscultation anteriorly. Abdomen - Soft - non tender Skin - Warm and dry Obese. 1 to 2 plus edema at the ankles Neurological Exam ;  :  The patient is lethargic, non-verbal. The patient has a pain response with passive movement of any extremity.Left gaze preference, and no blink to threat from the right.  Right hemiparesis, arm> leg, severe. Occasional spontaneous movements of the right foot . Will move the left side. The patient is mute, but will follow command to wiggle toes. Could not ambulate, and the patient will not follow commands for cerebellar testing. DTR are depressed, symmetric, right  babinski.   ASSESSMENT [name] is a 79 y.o. demented female presenting with unresponsiveness and respiratory distress after being found down at home. Imaging confirms a left large MCA infarct. Infarct embolic secondary to cardiac thrombus due to low EF. On aspirin 81 mg orally every day prior to admission. Now on aspirin suppository and IV heparin for secondary stroke prevention. Patient with resultant right hemiparesis, global aphasia, dysphagia. SNF recommended. Work up completed.    CAD  LDL 90 - not on any statin medication prior to admission.   HgbA1c 7.3  Severe dysphagia, failed repeat swallow evaluation. Has tube feedings. Needs PEG.  Obesity, Body mass index is 29.87 kg/(m^2).   Baseline dementia  Community acquired  pneumonia, on levaquin  UTI, on levaquin  Cardiomyopathy with EF 15%  Cardiac thrombus per TEE, anticoagulation recommended  Likely obstructive sleep apnea  Son has refused palliative care. Wants aggressive care. Understands SNF placement will be required at time of discharge as son cannot provide care.  Hospital day # 8  TREATMENT/PLAN  Continue heparin for secondary stroke prevention. Discontinue aspirin. Recommend oral anticoagulation once able to swallow.   PEG placement SNF at discharge  [name], MSN, RN, ANVP-BC, ANP-BC, GNP-BC [facility] Pager: [phone] 07/29/2012 5:06 PM  I have personally obtained a history, examined the patient, evaluated imaging results, and formulated the assessment and plan of care. I agree with the above.  Delia Heady, MD

## 2012-07-29 NOTE — Progress Notes (Addendum)
Speech Language Pathology Dysphagia Treatment Patient Details Name: Jenny Brady MRN: [id number] DOB: 24-Nov-1925 Today's Date: 07/29/2012 Time: 7829-5621 SLP Time Calculation (min): 102 min  Assessment / Plan / Recommendation Clinical Impression  Pt seen for trials of puree textures. SLP utilized [place] interpreter on the phone to give pt direction and encouragement. Pt seemed more responsive, but did not follow any commands with interpretation. Pt did nod head yes x2 with max verbal and visual cues. Pt  initally refused of ice chips, but became more willing to open mouth and manipulate chip with verbal encouragement from interpreter and tactile cues from SLP. Watery eyes after three chips indicates possible intolerance, but there was no cough. Pt was unable to take sips of water via straw despite max cues. With teaspoon bites of puree, right oral weakness impaired efficient transit, but with piecemeal swallows pt cleared oral cavity over 3-4 attempts.   As pt and MD are currently undecided about PEG tube, suggest initiating a puree/pudding thick diet to determine if pt may tolerate POs in context of meal with repeated attempts. Son also reports he will try to be present for one of her meals to see if his encouragment is helpful. SLP warned son that there is a risk of aspiration with diet. SLP will f/u in one day to observe with meal and discuss progress with staff.     Diet Recommendation  Initiate / Change Diet: Dysphagia 1 (puree);Pudding-thick liquid    SLP Plan Continue with current plan of care   Pertinent Vitals/Pain NA   Swallowing Goals  SLP Swallowing Goals Patient will utilize recommended strategies during swallow to increase swallowing safety with: Maximum assistance Goal #3: Pt will accept ice chips within 10-15 seconds to aid in determining readiness for po with total cues.   Swallow Study Goal #3 - Progress: Progressing toward goal  General Temperature Spikes Noted:  Yes Respiratory Status: Room air Behavior/Cognition: Alert Oral Cavity - Dentition: Adequate natural dentition Patient Positioning: Upright in bed  Oral Cavity - Oral Hygiene Does patient have any of the following "at risk" factors?: Nutritional status - dependent feeder;Oxygen therapy - cannula, mask, simple oxygen devices Brush patient's teeth BID with toothbrush (using toothpaste with fluoride): Yes Patient is HIGH RISK - Oral Care Protocol followed (see row info): Yes Patient is AT RISK - Oral Care Protocol followed (see row info): Yes   Dysphagia Treatment Treatment focused on: Upgraded PO texture trials;Facilitation of oral preparatory phase;Facilitation of oral phase;Utilization of compensatory strategies Treatment Methods/Modalities: Skilled observation Patient observed directly with PO's: Yes Type of PO's observed: Ice chips;Thin liquids;Dysphagia 1 (puree) Feeding: Total assist Liquids provided via: Teaspoon;Cup;Straw Oral Phase Signs & Symptoms: Anterior loss/spillage;Prolonged bolus formation;Prolonged oral phase;Right pocketing Pharyngeal Phase Signs & Symptoms: Suspected delayed swallow initiation;Multiple swallows Type of cueing: Verbal;Tactile;Visual Amount of cueing: Maximal   GO    Jenny Ditty, MA CCC-SLP [phone]  Jenny Brady 07/29/2012, 3:39 PM

## 2012-07-29 NOTE — Progress Notes (Signed)
Physical Therapy Treatment Patient Details Name: Jenny Brady MRN: [id number] DOB: Feb 29, 1926 Today's Date: 07/29/2012 Time: 6962-9528 PT Time Calculation (min): 30 min  PT Assessment / Plan / Recommendation Comments on Treatment Session  Adm s/p L MCA CVA. Slow progress with therapies. Pt moaning out with any mobility. Not following any commands. Did attempt standing today in prep for transfers.    Follow Up Recommendations  SNF     Does the patient have the potential to tolerate intense rehabilitation     Barriers to Discharge        Equipment Recommendations  None recommended by PT    Recommendations for Other Services    Frequency Min 3X/week   Plan Discharge plan remains appropriate;Frequency remains appropriate    Precautions / Restrictions Precautions Precautions: Fall Precaution Comments: right neglect, vision deficits, wears L mitt   Pertinent Vitals/Pain Denies pain    Mobility  Bed Mobility Bed Mobility: Rolling Right;Supine to Sit;Sitting - Scoot to [place] of Bed;Sit to Supine;Rolling Left Rolling Right: 1: +2 Total assist Rolling Right: Patient Percentage: 0% Rolling Left: 1: +2 Total assist Rolling Left: Patient Percentage: 0% Supine to Sit: 1: +2 Total assist;HOB elevated Supine to Sit: Patient Percentage: 0% Sit to Supine: 1: +2 Total assist;HOB flat Sit to Supine: Patient Percentage: 0% Details for Bed Mobility Assistance: again resistant to mobility, screaming out and moaning, once seated EOB able to stabilize, assist to protect RUE Transfers Transfers: Sit to Stand;Stand to Sit Sit to Stand: 1: +2 Total assist;From bed Sit to Stand: Patient Percentage: 20% Stand to Sit: 1: +2 Total assist;To bed Stand to Sit: Patient Percentage: 20% Details for Transfer Assistance: sit<>stand x2; facilitation for anterior translation of trunk, support for right knee and facilitation for tall trunk    Exercises General Exercises - Upper Extremity Shoulder  Flexion: PROM;Right;10 reps Shoulder Extension: PROM;Right;10 reps Elbow Flexion: PROM;Right;10 reps Elbow Extension: PROM;Right;10 reps     PT Goals Acute Rehab PT Goals PT Goal: Rolling Supine to Right Side - Progress: Not progressing PT Goal: Rolling Supine to Left Side - Progress: Not progressing PT Goal: Supine/Side to Sit - Progress: Not progressing PT Goal: Sit at Edge Of Bed - Progress: Progressing toward goal PT Goal: Sit to Supine/Side - Progress: Not progressing PT Goal: Sit to Stand - Progress: Progressing toward goal PT Goal: Stand to Sit - Progress: Progressing toward goal PT Goal: Ambulate - Progress: Discontinued (comment) (not an achievable goal right now) Additional Goals PT Goal: Additional Goal #1 - Progress: Progressing toward goal  Visit Information  Last PT Received On: 07/29/12 Assistance Needed: +2    Subjective Data  Subjective: "owwwwww," moaning and screaming out anytime she is moved   Copywriter, advertising Behavior During Therapy: Flat affect Overall Cognitive Status: Difficult to assess Area of Impairment: Attention;Following commands;Awareness Orientation Level: Disoriented to;Person;Place;Time;Situation Current Attention Level: Focused Following Commands: Follows one step commands inconsistently Awareness: Intellectual;Emergent;Anticipatory General Comments: awareness is absent, pt screaming out throughout all mobility regardless of consoling, appears anxious 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 Sitting Balance Static Sitting - Balance Support: Feet supported Static Sitting - Level of Assistance: 4: Min assist;5: Stand by assistance Static Sitting - Comment/# of Minutes: sat EOB 10 minutes, not following commands but responding to her name, attempted to get her to perform functional activities, unable to follow commands  End of Session PT - End of Session Equipment Utilized During Treatment: Gait  belt;Oxygen Activity Tolerance:  Patient limited by fatigue;Treatment limited secondary to agitation Patient left: in bed;with call bell/phone within reach;with bed alarm set Nurse Communication: Need for lift equipment;Mobility status   GP     [facility] HELEN 07/29/2012, 4:02 PM

## 2012-07-30 DIAGNOSIS — R1310 Dysphagia, unspecified: Secondary | ICD-10-CM

## 2012-07-30 DIAGNOSIS — F0391 Unspecified dementia with behavioral disturbance: Secondary | ICD-10-CM

## 2012-07-30 LAB — GLUCOSE, CAPILLARY
Glucose-Capillary: 176 mg/dL — ABNORMAL HIGH (ref 70–99)
Glucose-Capillary: 129 mg/dL — ABNORMAL HIGH (ref 70–99)

## 2012-07-30 LAB — CBC
MCV: 74.8 fL — ABNORMAL LOW (ref 78.0–100.0)
Platelets: 259 10*3/uL (ref 150–400)
RDW: 15.8 % — ABNORMAL HIGH (ref 11.5–15.5)
WBC: 5 10*3/uL (ref 4.0–10.5)

## 2012-07-30 LAB — HEPARIN LEVEL (UNFRACTIONATED): Heparin Unfractionated: 0.42 IU/mL (ref 0.30–0.70)

## 2012-07-30 MED ORDER — JEVITY 1.2 CAL PO LIQD
ORAL | Status: AC
  Filled 2012-07-30: qty 237

## 2012-07-30 NOTE — Progress Notes (Signed)
Pt. Appears confused about wearing CPAP and refused.  Pt. Shows no signs of OSA  Or desaturation.  Per RN O2 sat's are WNL. Lowest reading at 96%.  Pt. Also has NG tube which prevents a poor seal via the mask.  CPAP machine removed from room.

## 2012-07-30 NOTE — Progress Notes (Signed)
NUTRITION FOLLOW UP  Intervention:   Continue Jevity 1.2 @ 60 ml/hr with 150 ml H2O flush every 6 hours TF regimen provides 1728 kcal, 80 grams protein, 1166 ml H2O; total free water 1766 ml   Nutrition Dx:   Inadequate oral intake related to inability to eat as evidenced by NPO status; ongoing.  Goal:  Pt to meet >/= 90% of their estimated nutrition needs; met.   Monitor:  TF tolerance, weight trend, labs  Assessment:   Pt admitted with large MCA stroke. Pt discussed during rounds and with RN. Per RN, MD dicussed PEG with pt's son this am and he wants to give pt a chance to eat. Diet advanced by SLP this am to Dysphagia 1 with Pudding thick liquids, pt has not had a meal tray yet. Per RN pt is very lethargic.   Patient has NG tube in place with tip of tube in proximal jejunum. Jevity 1.2 is infusing @ 60 ml/hr.  Residuals: none documented    Height: Ht Readings from Last 1 Encounters:  07/22/12 5\' 6"  (1.676 m)    Weight Status:   Wt Readings from Last 1 Encounters:  07/30/12 184 lb 15.5 oz (83.9 kg)  No new weight  Re-estimated needs:  Kcal: 1600-1750  Protein: 80-90 grams  Fluid: > 1.6 L/day  Skin: no issues noted  Diet Order: Dysphagia   Intake/Output Summary (Last 24 hours) at 07/30/12 0933 Last data filed at 07/30/12 0414  Gross per 24 hour  Intake      0 ml  Output   2100 ml  Net  -2100 ml    Last BM: 4/15   Labs:   Recent Labs Lab 07/24/12 0455 07/25/12 0515  NA 139 135  K 4.2 4.0  CL 102 101  CO2 31 28  BUN 18 15  CREATININE 0.89 0.79  CALCIUM 8.3* 8.5  GLUCOSE 110* 105*    CBG (last 3)   Recent Labs  07/29/12 2355 07/30/12 0409 07/30/12 0804  GLUCAP 132* 133* 132*    Scheduled Meds: . free water  150 mL Per Tube Q6H  . levothyroxine  37.5 mcg Intravenous QAC breakfast  . metoprolol  2.5 mg Intravenous Q12H  . pantoprazole (PROTONIX) IV  40 mg Intravenous QHS    Continuous Infusions: . dextrose 5 % and 0.9% NaCl 50  mL/hr at 07/30/12 0646  . feeding supplement (JEVITY 1.2 CAL) 1,000 mL (07/30/12 0119)  . heparin 1,000 Units/hr (07/29/12 1535)    Kendell Bane RD, LDN, CNSC [phone] Pager [phone] After Hours Pager

## 2012-07-30 NOTE — Progress Notes (Signed)
Occupational Therapy Treatment Patient Details Name: Jenny Brady MRN: [id number] DOB: Oct 20, 1925 Today's Date: 07/30/2012 Time: 4098-1191 OT Time Calculation (min): 23 min  OT Assessment / Plan / Recommendation Comments on Treatment Session pt. with left gaze, attempted orientation and movement of head and eyes to right, pt. unable to follow    Follow Up Recommendations  SNF    Barriers to Discharge       Equipment Recommendations       Recommendations for Other Services    Frequency Min 3X/week   Plan Discharge plan remains appropriate    Precautions / Restrictions Precautions Precautions: Fall   Pertinent Vitals/Pain Pain appears managed, pt. settled    ADL  Grooming: Performed;Wash/dry face;Maximal assistance Where Assessed - Grooming: Supine, head of bed up ADL Comments: supported with hob in elevated position, pt. able to bring left hand to face with hand over hand assistance to wash face., con't. PROM to RUE    OT Diagnosis:    OT Problem List:   OT Treatment Interventions:     OT Goals ADL Goals ADL Goal: Grooming - Progress: Progressing toward goals Arm Goals Arm Goal: PROM - Progress: Progressing toward goal  Visit Information  Last OT Received On: 07/30/12    Subjective Data  Subjective: pt. moaning throughout session   Prior Functioning       Cognition  Cognition Arousal/Alertness: Lethargic Behavior During Therapy: Flat affect Overall Cognitive Status: Difficult to assess Area of Impairment: Attention;Following commands;Awareness Orientation Level: Disoriented to;Person;Place;Time;Situation Current Attention Level: Focused Following Commands: Follows one step commands inconsistently Awareness: Intellectual;Emergent;Anticipatory    Mobility       Exercises      Balance     End of Session OT - End of Session Activity Tolerance: Patient limited by fatigue;Patient limited by pain Patient left: in bed;with call bell/phone within  reach;with bed alarm set  GO     Robet Leu 07/30/2012, 3:03 PM

## 2012-07-30 NOTE — Progress Notes (Signed)
Patient had 12 beats of V. Tach. Patient doesn't seems to be in distress. Vitals BP 98/46, HR 75, 98% on 2L of O2. Notified K.Schorr/NP. No new orders received. Will continue to monitor

## 2012-07-30 NOTE — Progress Notes (Signed)
Pt refused CPAP tonight, no machine in room. 

## 2012-07-30 NOTE — Progress Notes (Signed)
Speech Language Pathology Dysphagia Treatment Patient Details Name: Jenny Brady MRN: [id number] DOB: 01-07-1926 Today's Date: 07/30/2012 Time: 4098-1191 SLP Time Calculation (min): 28 min  Assessment / Plan / Recommendation Clinical Impression  Therapeutic intervention to faciliatate consumption of am meals. With max, primarily textile cues and gentle verbal cues the pt consumed 2oz of yogurt and 1oz of magic cup without evidence of aspiration. Pt did exhibit prolonged oral phase and right buccal residuals that she eventually cleared with independent multiple swallows. Pt requires lots of encouragement to eat, but she is making progress. Barriers to intake are primarily behavioral and cognitive. Theough moderate oral dysphagia is present, suspect adequate pharyngeal function. Continue current diet, suggest modification of tube feeds to encourage appetite.     Diet Recommendation  Initiate / Change Diet: Dysphagia 1 (puree);Pudding-thick liquid    SLP Plan Continue with current plan of care   Pertinent Vitals/Pain NA   Swallowing Goals  SLP Swallowing Goals Patient will utilize recommended strategies during swallow to increase swallowing safety with: Maximum assistance Swallow Study Goal #2 - Progress: Progressing toward goal  General Temperature Spikes Noted: No Respiratory Status: Supplemental O2 delivered via (comment) Behavior/Cognition: Alert Oral Cavity - Dentition: Adequate natural dentition Patient Positioning: Upright in bed  Oral Cavity - Oral Hygiene Does patient have any of the following "at risk" factors?: Nutritional status - dependent feeder;Oxygen therapy - cannula, mask, simple oxygen devices Patient is HIGH RISK - Oral Care Protocol followed (see row info): Yes Patient is AT RISK - Oral Care Protocol followed (see row info): Yes   Dysphagia Treatment Treatment focused on: Skilled observation of diet tolerance;Facilitation of oral preparatory  phase;Facilitation of oral phase Treatment Methods/Modalities: Skilled observation Patient observed directly with PO's: Yes Type of PO's observed: Dysphagia 1 (puree) Feeding: Total assist Oral Phase Signs & Symptoms: Anterior loss/spillage;Right pocketing;Prolonged bolus formation;Prolonged oral phase Pharyngeal Phase Signs & Symptoms: Suspected delayed swallow initiation;Multiple swallows Type of cueing: Verbal;Tactile;Visual Amount of cueing: Maximal   GO    Harlon Ditty, MA CCC-SLP [phone]  [name], [name] 07/30/2012, 1:16 PM

## 2012-07-30 NOTE — Progress Notes (Signed)
,Patient ID: Jenny Brady  female  RUE:[id number]    DOB: May 26, 1925    DOA: 07/21/2012  PCP: [name], MD  Interim Summary: 79 yo female w/hx pf CAD, dementia came in on 4/13 for resp distress & R facial droop/hemiparesis.  Pt's workup found to have large left MCA CVA secondary to possible intracardiac thrombus.  She also failed swallow eval initially, temporary feeding tube placed on 07/23/12. Patient was finally placed on dysphagia 1 diet with pudding thick liquids by speech therapy on 07/29/12.  Assessment/Plan:    Acute ischemic large left MCA stroke:  - CT head was obtained which showed large left MCA distribution infarct, most likely embolic. Not a candidate for thrombolytics due to unknown time of onset, delayed presentation. - Currently on heparin drip - MRI of the brain showed large left hemispheric middle cerebral artery distribution infarct involving left temporal lobe, left frontal lobe and left opercular/sub insular region, involvement of the left lenticular nucleus/left corona radiata with local mass effect. Thrombus within the left middle cerebral artery suspected with occluded branches distal to this region. -  2-D echo showed EF of 15%, severe tricuspid regurgitation, moderate PHTN, PAP 62, moderate MR -  carotid Dopplers showed no ICA stenosis, antegrade vertebral flow - TEE on 4/18 showed possible intracardiac thrombus, Dr Rito Ehrlich discussed with cardiology and  Dr Shirlee Latch feels that the patient does have intracardiac thrombus.  -  Cont heparin drip per cardiology and the stroke service recommendations, eventually will need warfarin once able to take PO or PEG tube    DEMENTIA-mod-severe: Unknown baseline however patient is somewhat alert but not following any commands with new large left MCA stroke   Hyperthyroidism-s/p RAI admin: Now hypothyroidism, TSH 13.9 - On IV Synthroid 37.5 mcg daily.    Coronary artery disease with acute on chronic systolic CHF (EF  was 25-30% in 2012) - 2-D echo shows EF of 15% , BP currently borderline low - Will hold her Lasix to avoid hypotension in view of acute CVA  CAP (community acquired pneumonia)- left lung Patient has completed 8 days of antibiotics.      UTI (lower urinary tract infection):  - Urine culture showed no growth  DVT Prophylaxis: SCD's  Code Status:  Full code  Disposition: Per patient's son, Jenny Brady  Goals of care  1) he wants her to be FULL CODE STATUS and continue aggressive care "to keep her alive and going" 2) okay for artificial feeding temporarily but no decision regarding permanent feeding  Discussed with patient's son in detail this morning, he wants to observe for another 24 hours if patient is able to continue oral diet, he was not successful with the feeding her, then he will make decision about permanent feeding/PEG .  Also he requested SNF placement.    Subjective:  patient really nonverbal, but did not open her eyes to me. Moaning, son at the bed side.  Objective: Weight change: 0 kg (0 lb)  Intake/Output Summary (Last 24 hours) at 07/30/12 1122 Last data filed at 07/30/12 0414  Gross per 24 hour  Intake      0 ml  Output   2100 ml  Net  -2100 ml   Blood pressure 112/50, pulse 80, temperature 97.7 F (36.5 C), temperature source Oral, resp. rate 20, height 5\' 6"  (1.676 m), weight 83.9 kg (184 lb 15.5 oz), SpO2 97.00%.  Physical Exam: General: lethargic moaning,  CVS: S1-S2 clear Chest: CTAB Abdomen: obese, soft, nontender Extremities: no  c/c/e Neuro: Does not follow any commands today  Lab Results: Basic Metabolic Panel:  Recent Labs Lab 07/24/12 0455 07/25/12 0515  NA 139 135  K 4.2 4.0  CL 102 101  CO2 31 28  GLUCOSE 110* 105*  BUN 18 15  CREATININE 0.89 0.79  CALCIUM 8.3* 8.5   CBC:  Recent Labs Lab 07/29/12 0610 07/30/12 0425  WBC 5.4 5.0  HGB 11.0* 11.0*  HCT 35.5* 34.7*  MCV 74.6* 74.8*  PLT 246 259   CBG:  Recent  Labs Lab 07/29/12 1624 07/29/12 2143 07/29/12 2355 07/30/12 0409 07/30/12 0804  GLUCAP 142* 144* 132* 133* 132*     Studies/Results: Dg Chest 1 View  07/21/2012   IMPRESSION:  1.  Cardiomegaly and pulmonary edema. 2.  Small left pleural effusion and basilar airspace disease which could be due to atelectasis or pneumonia.   Original Report Authenticated By: Holley Dexter, M.D.    Dg Chest 2 View  07/22/2012   IMPRESSION:  1.  Persistent pulmonary edema. 2.  No change in aeration to the left lower lobe.   Original Report Authenticated By: Signa Kell, M.D.    Ct Head Wo Contrast  07/21/2012  *RADIOLOGY REPORT*  Clinical Data: Altered mental status.  The patient is unresponsive.  CT HEAD WITHOUT CONTRAST  Technique:  Contiguous axial images were obtained from the base of the skull through the vertex without contrast.  Comparison: 02/09/2008  Findings: The patient has an acute large non hemorrhagic left middle cerebral artery distribution infarct with slight edema.  No midline shift at this time. There is a hyper dense branch of the left middle cerebral artery seen on image number 11 of series 2, consistent with acute thrombosis.  There is an old left occipital infarct, new since 02/09/2008.  Diffuse cerebral cortical atrophy, most prominent in the temporal lobes.  No significant osseous abnormality.  IMPRESSION:  1.  Large nonhemorrhagic left middle cerebral artery infarct with early edema without midline shift. 2.  Old left occipital infarct. 3.  Atrophy, most prominent in the temporal lobes.   Original Report Authenticated By: Francene Boyers, M.D.    Mr Brain Wo Contrast  07/22/2012  *RADIOLOGY REPORT*  Clinical Data: Altered mental status.  Abnormal CT.  MRI HEAD WITHOUT CONTRAST  Technique:  Multiplanar, multiecho pulse sequences of the brain and surrounding structures were obtained according to standard protocol without intravenous contrast.  Comparison: 07/21/2012 CT.  03/08/2004 MR.   Findings: Motion degraded exam.  MR angiogram was ordered although the patient not able to cooperate for this exam.  Large left hemispheric middle cerebral artery distribution infarct involving left temporal lobe, left frontal lobe and left opercular/sub insular region.  Involvement of the left lenticular nucleus/left corona radiata.  No intracranial hemorrhage.  Local mass effect without midline shift.  Remote left occipital lobe infarct with encephalomalacia.  Mild small vessel disease type changes.  Global atrophy without hydrocephalus.  No intracranial mass lesion detected on this unenhanced exam.  Limited evaluation of the intracranial vascular structures secondary to the motion.  Thrombus within the left middle cerebral artery suspected with occluded branches distal to this region.  IMPRESSION: Motion degraded exam.  Large left middle cerebral artery distribution infarct as noted above.  Thrombus within the left middle cerebral artery suspected with occluded branches distal to this region.  This has been made a PRA call report utilizing dashboard call feature.   Original Report Authenticated By: Lacy Duverney, M.D.     Medications: Scheduled  Meds: . free water  150 mL Per Tube Q6H  . levothyroxine  37.5 mcg Intravenous QAC breakfast  . metoprolol  2.5 mg Intravenous Q12H  . pantoprazole (PROTONIX) IV  40 mg Intravenous QHS      LOS: 9 days   RAI,RIPUDEEP M.D.  [facility]  07/30/2012, 11:22 AM  Pager: [phone]   If 7PM-7AM, please contact night-coverage [email or web address] Password [email or web address]

## 2012-07-30 NOTE — Progress Notes (Signed)
Stroke Team Progress Note  HISTORY Jenny Brady is an 79 y.o. female with a past medical history significant for dementia, CAD, CHF, hyperthyroidism, transferred from [facility] for further management of a large left MCA distribution infarct. Patient is not able to provide information and thus all clinical data is gathered from patient's medical record. She appaently lives by herself and on 07/21/2012 she was found by her son unresponsive with some respiratory distress and was taken to [facility] where she was found to be poorly responsive and unable to move the right side. An emergent CT brain revealed a large area of decreased attenuation in the left MCA distribution suggestive of an ischemic infarct. Transferred to [facility] for further management. Patient was not a TPA candidate secondary to unknown time of symptom onset.  SUBJECTIVE No family members present. Dr. Isidoro Donning spoke with son this am who wants to delay PEG decision until tomorrow.  OBJECTIVE Most recent Vital Signs: Filed Vitals:   07/30/12 0500 07/30/12 0537 07/30/12 0543 07/30/12 0932  BP:  99/37 98/46 112/50  Pulse:  67  80  Temp:  98.1 F (36.7 C)  97.7 F (36.5 C)  TempSrc:  Axillary  Oral  Resp:    20  Height:      Weight: 83.9 kg (184 lb 15.5 oz)     SpO2:    97%   CBG (last 3)   Recent Labs  07/30/12 0409 07/30/12 0804 07/30/12 1142  GLUCAP 133* 132* 149*   IV Fluid Intake:   . dextrose 5 % and 0.9% NaCl 50 mL/hr at 07/30/12 0646  . feeding supplement (JEVITY 1.2 CAL) 1,000 mL (07/30/12 0119)  . heparin 1,000 Units/hr (07/29/12 1535)   MEDICATIONS  . free water  150 mL Per Tube Q6H  . levothyroxine  37.5 mcg Intravenous QAC breakfast  . metoprolol  2.5 mg Intravenous Q12H  . pantoprazole (PROTONIX) IV  40 mg Intravenous QHS   PRN:  acetaminophen, acetaminophen, albuterol, ondansetron (ZOFRAN) IV  Diet:  Dysphagia 1 pudding thick liquids Activity:  OOB with assistance DVT Prophylaxis:  SCDs   CLINICALLY SIGNIFICANT  STUDIES Basic Metabolic Panel:   Recent Labs Lab 07/24/12 0455 07/25/12 0515  NA 139 135  K 4.2 4.0  CL 102 101  CO2 31 28  GLUCOSE 110* 105*  BUN 18 15  CREATININE 0.89 0.79  CALCIUM 8.3* 8.5   Liver Function Tests:  No results found for this basename: AST, ALT, ALKPHOS, BILITOT, PROT, ALBUMIN,  in the last 168 hours CBC:   Recent Labs Lab 07/29/12 0610 07/30/12 0425  WBC 5.4 5.0  HGB 11.0* 11.0*  HCT 35.5* 34.7*  MCV 74.6* 74.8*  PLT 246 259   Coagulation: No results found for this basename: LABPROT, INR,  in the last 168 hours Cardiac Enzymes:  No results found for this basename: CKTOTAL, CKMB, CKMBINDEX, TROPONINI,  in the last 168 hours Urinalysis:  No results found for this basename: COLORURINE, APPERANCEUR, LABSPEC, PHURINE, GLUCOSEU, HGBUR, BILIRUBINUR, KETONESUR, PROTEINUR, UROBILINOGEN, NITRITE, LEUKOCYTESUR,  in the last 168 hours Lipid Panel    Component Value Date/Time   CHOL 136 07/22/2012 0032   TRIG 93 07/22/2012 0032   HDL 27* 07/22/2012 0032   CHOLHDL 5.0 07/22/2012 0032   VLDL 19 07/22/2012 0032   LDLCALC 90 07/22/2012 0032   HgbA1C  Lab Results  Component Value Date   HGBA1C 7.3* 07/22/2012   Urine Drug Screen:   No results found for this basename: labopia,  cocainscrnur,  labbenz,  amphetmu,  thcu,  labbarb    Alcohol Level: No results found for this basename: ETH,  in the last 168 hours  CT of the brain  07/21/2012   1.  Large nonhemorrhagic left middle cerebral artery infarct with early edema without midline shift. 2.  Old left occipital infarct. 3.  Atrophy, most prominent in the temporal lobes.   MRI of the brain  07/22/2012  Motion degraded exam.  Large left middle cerebral artery distribution infarct as noted above.  Thrombus within the left middle cerebral artery suspected with occluded branches distal to this region.    MRA of the brain  1. Left MCA occluded or nearly occluded in the M1 segment just beyond its origin. This corresponds to  the earlier MRI findings. Faint more distal left MCA flow signal; little to no M2 segment  flow signal. 2. Moderately degraded by motion despite repeated imaging  attempts.  2D Echocardiogram  EF 15% with no source of embolus.   Carotid Doppler  No evidence of hemodynamically significant internal carotid artery stenosis. Vertebral artery flow is antegrade.   TEE 07/26/2012 - ejection fraction 20-25%. There was a small mobile structure attached to the anterior wall of the LV. This may have been a loose trabeculation but cannot rule out thrombus given history."Would consider anticoagulation if she would be a safe candidate"  TCD    CXR   07/22/2012    1.  Persistent pulmonary edema. 2.  No change in aeration to the left lower lobe.  07/21/2012  1.  Cardiomegaly and pulmonary edema. 2.  Small left pleural effusion and basilar airspace disease which could be due to atelectasis or pneumonia.   EKG  normal sinus rhythm, LBBB.   Therapy Recommendations CIR, CIR recommends SNF  Physical Exam   General - 79 year old female lying in bed mute with left gaze preference. NG tube now back in place. Heart - Regular with ectopics Lungs - Clear to auscultation anteriorly. Abdomen - Soft - non tender Skin - Warm and dry Obese. 1 to 2 plus edema at the ankles Neurological Exam ;  :  The patient is lethargic, non-verbal. The patient has a pain response with passive movement of any extremity.Left gaze preference, and no blink to threat from the right.  Right hemiparesis, arm> leg, severe. Occasional spontaneous movements of the right foot . Will move the left side. The patient is mute, but will follow command to wiggle toes. Could not ambulate, and the patient will not follow commands for cerebellar testing. DTR are depressed, symmetric, right babinski.   ASSESSMENT Jenny Brady is a 78 y.o. demented female presenting with unresponsiveness and respiratory distress after being found down at home.  Imaging confirms a left large MCA infarct. Infarct embolic secondary to cardiac thrombus due to low EF. On aspirin 81 mg orally every day prior to admission. Now on aspirin suppository and IV heparin for secondary stroke prevention. Patient with resultant right hemiparesis, global aphasia, dysphagia. SNF recommended. Work up completed.    CAD  LDL 90 - not on any statin medication prior to admission.   HgbA1c 7.3  Severe dysphagia, failed repeat swallow evaluation over the weekend. Now attemptign D1 pudding thick liquids to see how she dose. Has tube feedings. Son decising about PEG. Will need PEG for long-term survival.  Obesity, Body mass index is 29.87 kg/(m^2).   Baseline dementia  Community acquired pneumonia, on levaquin  UTI, on levaquin  Cardiomyopathy with EF 15%  Cardiac thrombus per TEE, anticoagulation recommended  Likely obstructive sleep apnea  Son has refused palliative care. Wants aggressive care. Understands SNF placement will be required at time of discharge as son cannot provide care.  Hospital day # 9  TREATMENT/PLAN  Continue heparin for secondary stroke prevention. Recommend oral anticoagulation once able to swallow or PEG placed  PEG placement recommended. Watching pt with D1, pudding thick liquid diet for now SNF at discharge  [name], MSN, RN, ANVP-BC, ANP-BC, GNP-BC [facility] Pager: [phone] 07/30/2012 12:16 PM  I have personally obtained a history, examined the patient, evaluated imaging results, and formulated the assessment and plan of care. I agree with the above. Delia Heady, MD

## 2012-07-30 NOTE — Progress Notes (Signed)
ANTICOAGULATION CONSULT NOTE - Follow Up Consult  Pharmacy Consult for Heparin Indication: Large L-MCA thrombus due to intracardiac thrombus  Dosing Weight: 78   Recent Labs  07/28/12 0751 07/28/12 1525 07/29/12 0610 07/30/12 0425  HGB 11.4*  --  11.0* 11.0*  HCT 35.9*  --  35.5* 34.7*  PLT 243  --  246 259  HEPARINUNFRC 0.55 0.61 0.39 0.42   Estimated Creatinine Clearance: 57.1 ml/min (by C-G formula based on Cr of 0.79).  Medications:  Infusions:  . dextrose 5 % and 0.9% NaCl 50 mL/hr at 07/30/12 0646  . feeding supplement (JEVITY 1.2 CAL) 1,000 mL (07/30/12 0119)  . heparin 1,000 Units/hr (07/29/12 1535)   Assessment:  79 y/o female continues on Heparin for a Large L-MCA thrombus due to intracardiac thrombus  Heparin level within therapeutic range 0.42 units/ml.  No bleeding complications noted   Goal of Therapy:  Heparin level 0.3 - 0.5 units/ml   Plan:   Continue Heparin infusion at the current rate 1000 units/hr.  Daily Heparin level and CBC while on Heparin.  Monitor for bleeding complications   Laurena Bering, Pharm.D.  07/30/2012,11:53 AM

## 2012-07-31 LAB — GLUCOSE, CAPILLARY
Glucose-Capillary: 124 mg/dL — ABNORMAL HIGH (ref 70–99)
Glucose-Capillary: 129 mg/dL — ABNORMAL HIGH (ref 70–99)
Glucose-Capillary: 145 mg/dL — ABNORMAL HIGH (ref 70–99)
Glucose-Capillary: 138 mg/dL — ABNORMAL HIGH (ref 70–99)

## 2012-07-31 LAB — CBC
HCT: 33.3 % — ABNORMAL LOW (ref 36.0–46.0)
Platelets: 248 10*3/uL (ref 150–400)
RBC: 4.47 MIL/uL (ref 3.87–5.11)
RDW: 15.8 % — ABNORMAL HIGH (ref 11.5–15.5)
WBC: 5 10*3/uL (ref 4.0–10.5)

## 2012-07-31 MED ORDER — SODIUM CHLORIDE 0.9 % IJ SOLN
10.0000 mL | INTRAMUSCULAR | Status: DC | PRN
  Administered 2012-08-04 – 2012-08-12 (×9): 10 mL

## 2012-07-31 NOTE — Progress Notes (Signed)
,Patient ID: Jenny Brady  female  ZOX:[id number]    DOB: 14-Nov-1925    DOA: 07/21/2012  PCP: [name], MD  Interim Summary: 79 yo female w/hx pf CAD, dementia came in on 4/13 for resp distress & R facial droop/hemiparesis.  Pt's workup found to have large left MCA CVA secondary to possible intracardiac thrombus.  She also failed swallow eval initially, temporary feeding tube placed on 07/23/12. Patient was finally placed on dysphagia 1 diet with pudding thick liquids by speech therapy on 07/29/12.  Assessment/Plan:    Acute ischemic large left MCA stroke:  - CT head was obtained which showed large left MCA distribution infarct, most likely embolic. Not a candidate for thrombolytics due to unknown time of onset, delayed presentation. - Currently on heparin drip - MRI of the brain showed large left hemispheric middle cerebral artery distribution infarct involving left temporal lobe, left frontal lobe and left opercular/sub insular region, involvement of the left lenticular nucleus/left corona radiata with local mass effect. Thrombus within the left middle cerebral artery suspected with occluded branches distal to this region. -  2-D echo showed EF of 15%, severe tricuspid regurgitation, moderate PHTN, PAP 62, moderate MR -  carotid Dopplers showed no ICA stenosis, antegrade vertebral flow - TEE on 4/18 showed possible intracardiac thrombus, Dr Rito Ehrlich discussed with cardiology and  Dr Shirlee Latch feels that the patient does have intracardiac thrombus.  -  Cont heparin drip per cardiology and the stroke service recommendations, eventually will need anticoagulation once able to take PO or PEG tube  Dysphagia: From CVA - Currently on Jevity via feeding tube. Even though patient was placed on dysphagia 1 diet, her oral intake has been inconsistent and poor - Patient somewhat is to make a decision about PEG tube. Patient will need a PEG for long-term survival. Son has refused to palliative  care in the past. I finally called the patient's son after waiting for him and briefly talked to him on the phone when he hung up without any decision.      DEMENTIA-mod-severe: Patient is somewhat more alert today, follow some commands, unknown baseline    Hyperthyroidism-s/p RAI admin: Now hypothyroidism, TSH 13.9 - On IV Synthroid 37.5 mcg daily.    Coronary artery disease with acute on chronic systolic CHF (EF was 25-30% in 2012) - 2-D echo shows EF of 15% , BP currently borderline low - Will hold her Lasix to avoid hypotension in view of acute CVA  CAP (community acquired pneumonia)- left lung Patient has completed 8 days of antibiotics.      UTI (lower urinary tract infection):  - Urine culture showed no growth  DVT Prophylaxis: SCD's  Code Status:  Full code  Disposition: Per patient's son, Dalyn Kjos  Goals of care  1) he wants her to be FULL CODE STATUS and continue aggressive care "to keep her alive and going" 2) okay for artificial feeding temporarily but no decision regarding permanent feeding 3) skilled nursing placement   Awaiting sounds a decision about PEG tube placement.  Subjective: Nonverbal but more alert and awake today, follow some verbal commands  Objective: Weight change: 0.287 kg (10.1 oz)  Intake/Output Summary (Last 24 hours) at 07/31/12 1310 Last data filed at 07/31/12 1200  Gross per 24 hour  Intake      0 ml  Output   2100 ml  Net  -2100 ml   Blood pressure 100/59, pulse 72, temperature 97.9 F (36.6 C), temperature source Axillary, resp.  rate 17, height 5\' 6"  (1.676 m), weight 84.188 kg (185 lb 9.6 oz), SpO2 100.00%.  Physical Exam: General:  alert and awake  CVS: S1-S2 clear Chest: CTAB Abdomen: obese, soft, nontender Extremities: no c/c/e Neuro: Follow some verbal commands, left gaze preference, tracks, wiggles toes, right hemiparesis  Lab Results: Basic Metabolic Panel:  Recent Labs Lab 07/25/12 0515  NA 135  K 4.0  CL  101  CO2 28  GLUCOSE 105*  BUN 15  CREATININE 0.79  CALCIUM 8.5   CBC:  Recent Labs Lab 07/30/12 0425 07/31/12 0637  WBC 5.0 5.0  HGB 11.0* 10.3*  HCT 34.7* 33.3*  MCV 74.8* 74.5*  PLT 259 248   CBG:  Recent Labs Lab 07/30/12 1959 07/31/12 0048 07/31/12 0407 07/31/12 0801 07/31/12 1236  GLUCAP 129* 124* 129* 145* 138*     Studies/Results: Dg Chest 1 View  07/21/2012   IMPRESSION:  1.  Cardiomegaly and pulmonary edema. 2.  Small left pleural effusion and basilar airspace disease which could be due to atelectasis or pneumonia.   Original Report Authenticated By: Holley Dexter, M.D.    Dg Chest 2 View  07/22/2012   IMPRESSION:  1.  Persistent pulmonary edema. 2.  No change in aeration to the left lower lobe.   Original Report Authenticated By: Signa Kell, M.D.    Ct Head Wo Contrast  07/21/2012  *RADIOLOGY REPORT*  Clinical Data: Altered mental status.  The patient is unresponsive.  CT HEAD WITHOUT CONTRAST  Technique:  Contiguous axial images were obtained from the base of the skull through the vertex without contrast.  Comparison: 02/09/2008  Findings: The patient has an acute large non hemorrhagic left middle cerebral artery distribution infarct with slight edema.  No midline shift at this time. There is a hyper dense branch of the left middle cerebral artery seen on image number 11 of series 2, consistent with acute thrombosis.  There is an old left occipital infarct, new since 02/09/2008.  Diffuse cerebral cortical atrophy, most prominent in the temporal lobes.  No significant osseous abnormality.  IMPRESSION:  1.  Large nonhemorrhagic left middle cerebral artery infarct with early edema without midline shift. 2.  Old left occipital infarct. 3.  Atrophy, most prominent in the temporal lobes.   Original Report Authenticated By: Francene Boyers, M.D.    Mr Brain Wo Contrast  07/22/2012  *RADIOLOGY REPORT*  Clinical Data: Altered mental status.  Abnormal CT.  MRI HEAD  WITHOUT CONTRAST  Technique:  Multiplanar, multiecho pulse sequences of the brain and surrounding structures were obtained according to standard protocol without intravenous contrast.  Comparison: 07/21/2012 CT.  03/08/2004 MR.  Findings: Motion degraded exam.  MR angiogram was ordered although the patient not able to cooperate for this exam.  Large left hemispheric middle cerebral artery distribution infarct involving left temporal lobe, left frontal lobe and left opercular/sub insular region.  Involvement of the left lenticular nucleus/left corona radiata.  No intracranial hemorrhage.  Local mass effect without midline shift.  Remote left occipital lobe infarct with encephalomalacia.  Mild small vessel disease type changes.  Global atrophy without hydrocephalus.  No intracranial mass lesion detected on this unenhanced exam.  Limited evaluation of the intracranial vascular structures secondary to the motion.  Thrombus within the left middle cerebral artery suspected with occluded branches distal to this region.  IMPRESSION: Motion degraded exam.  Large left middle cerebral artery distribution infarct as noted above.  Thrombus within the left middle cerebral artery suspected with occluded branches  distal to this region.  This has been made a PRA call report utilizing dashboard call feature.   Original Report Authenticated By: Lacy Duverney, M.D.     Medications: Scheduled Meds: . free water  150 mL Per Tube Q6H  . levothyroxine  37.5 mcg Intravenous QAC breakfast  . metoprolol  2.5 mg Intravenous Q12H  . pantoprazole (PROTONIX) IV  40 mg Intravenous QHS      LOS: 10 days   RAI,RIPUDEEP M.D.  [facility]  07/31/2012, 1:10 PM  Pager: [phone]   If 7PM-7AM, please contact night-coverage [email or web address] Password [email or web address]

## 2012-07-31 NOTE — Progress Notes (Signed)
Peripherally Inserted Central Catheter/Midline Placement  The IV Nurse has discussed with the patient and/or persons authorized to consent for the patient, the purpose of this procedure and the potential benefits and risks involved with this procedure.  The benefits include less needle sticks, lab draws from the catheter and patient may be discharged home with the catheter.  Risks include, but not limited to, infection, bleeding, blood clot (thrombus formation), and puncture of an artery; nerve damage and irregular heat beat.  Alternatives to this procedure were also discussed.  PICC/Midline Placement Documentation        Jenny Brady 07/31/2012, 2:50 PM

## 2012-07-31 NOTE — Progress Notes (Signed)
ANTICOAGULATION CONSULT NOTE - Follow Up Consult   Pharmacy Consult for Heparin Indication: Large L-MCA thrombus due to intracardiac thrombus  Dosing Weight: 78 kg  Labs:  Recent Labs  07/29/12 0610 07/30/12 0425 07/31/12 0637  HGB 11.0* 11.0* 10.3*  HCT 35.5* 34.7* 33.3*  PLT 246 259 248  HEPARINUNFRC 0.39 0.42 0.45   Estimated Creatinine Clearance: 57.3 ml/min (by C-G formula based on Cr of 0.79).  Medications:  Infusions:  . Heparin 1,000 Units/hr (07/30/12 1704)   Assessment:  79 y/o female continues on Heparin for a Large L-MCA thrombus due to intracardiac thrombus   Heparin level within therapeutic range 0.45 units/ml. No bleeding complications noted   Goal of Therapy:  Heparin level 0.3 - 0.5   Plan:   Continue Heparin infusion at the current rate.   Daily Heparin level and CBC while on Heparin.   Monitor for bleeding complications   Laurena Bering, Pharm.D.  07/31/2012,12:05 PM

## 2012-07-31 NOTE — Clinical Social Work Note (Signed)
Clinical Social Work   CSW spoke with pt's son and provided bed offers. CSW explained the process of discharging to a SNF with priamry insurance. CSW will follow up on bed choice. CSW will continue to follow.   Dede Query, MSW, LCSW [phone]

## 2012-07-31 NOTE — Progress Notes (Signed)
Physical Therapy Treatment Patient Details Name: Jenny Brady MRN: [id number] DOB: 1925-09-25 Today's Date: 07/31/2012 Time: 4098-1191 PT Time Calculation (min): 30 min  PT Assessment / Plan / Recommendation Comments on Treatment Session  Adm. s/p L MCA CVA. Again, very slow progress. Attempted to utilize interpreter and son to engage patient or improve comprehension by speeking to her in her native language however pt still with limited understanding during our session, not following commands today. Still screaming out with moblity/touch. Is she in pain? Difficult to determine given her aphasia. Tried to educate son on importance of him being at these sessions but he had very avoidant behavior.     Follow Up Recommendations  SNF     Does the patient have the potential to tolerate intense rehabilitation     Barriers to Discharge        Equipment Recommendations       Recommendations for Other Services    Frequency Min 3X/week   Plan Discharge plan remains appropriate;Frequency remains appropriate    Precautions / Restrictions Precautions Precautions: Fall Precaution Comments: right neglect, vision deficits, wears L mitt Restrictions Weight Bearing Restrictions: No   Pertinent Vitals/Pain Unable to determine if patient is in pain, she moans with all mobility and says "ow" a lot but unable to localize a point of pain    Mobility  Bed Mobility Bed Mobility: Rolling Right;Rolling Left Rolling Right: 1: +2 Total assist Rolling Right: Patient Percentage: 0% Rolling Left: 1: +2 Total assist Rolling Left: Patient Percentage: 0% Supine to Sit: 1: +2 Total assist;HOB elevated Supine to Sit: Patient Percentage: 0% Sitting - Scoot to Edge of Bed: 1: +1 Total assist Sit to Supine: 1: +2 Total assist;HOB flat Sit to Supine: Patient Percentage: 0% Details for Bed Mobility Assistance: again resistant to mobility, screaming out and moaning, once seated EOB able to stabilize, assist  to protect RUE Transfers Sit to Stand: 1: +2 Total assist;From bed Sit to Stand: Patient Percentage: 0% Stand to Sit: 1: +2 Total assist;To bed Stand to Sit: Patient Percentage: 0% Details for Transfer Assistance: even with interpreter telling her what we were going to be doing and son encouraging her in greek she did not seem to bring to accept weight through her lower extremities today or standing up; attempted x3 with poor results Ambulation/Gait Ambulation/Gait Assistance: Not tested (comment)    Exercises     PT Diagnosis:    PT Problem List:   PT Treatment Interventions:     PT Goals Acute Rehab PT Goals PT Goal: Rolling Supine to Right Side - Progress: Not progressing PT Goal: Rolling Supine to Left Side - Progress: Not progressing PT Goal: Supine/Side to Sit - Progress: Not progressing PT Goal: Sit at Edge Of Bed - Progress: Progressing toward goal PT Goal: Sit to Supine/Side - Progress: Not progressing PT Goal: Sit to Stand - Progress: Not progressing PT Goal: Stand to Sit - Progress: Not progressing PT Transfer Goal: Bed to Chair/Chair to Bed - Progress: Not progressing PT Goal: Stand - Progress: Not progressing PT Goal: Ambulate - Progress: Discontinued (comment) Additional Goals PT Goal: Additional Goal #1 - Progress: Not progressing  Visit Information  Last PT Received On: 07/31/12 Assistance Needed: +2    Subjective Data  Subjective: moanin, did attend to the phone/interpreter and it appeared that she nodded but difficult to tell   Cognition  Cognition Behavior During Therapy: Flat affect Overall Cognitive Status: Difficult to assess (aphasic, pref language is greek??) Area of  Impairment: Attention;Following commands;Awareness Orientation Level: Disoriented to;Person;Place;Time;Situation Current Attention Level: Focused Following Commands: Follows one step commands inconsistently Awareness: Intellectual;Emergent;Anticipatory General Comments: awareness is  absent, pt screaming out throughout all mobility regardless of consoling, appears anxious 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 Sitting Balance Static Sitting - Level of Assistance: 4: Min assist;5: Stand by assistance Static Sitting - Comment/# of Minutes: tactile and verbal cueing (by interpreter and son) for tall posture, pt very slumped with right lateral flexion; tends to push and scoot with LUE demonstrating poor awareness of midline; with mod facilitation we had her propped on her LUE to reduce pushing with some success  End of Session PT - End of Session Equipment Utilized During Treatment: Gait belt;Oxygen Activity Tolerance: Patient limited by fatigue;Treatment limited secondary to agitation Patient left: in bed;with call bell/phone within reach;with bed alarm set Nurse Communication: Need for lift equipment;Mobility status   GP     [facility] HELEN 07/31/2012, 2:34 PM

## 2012-07-31 NOTE — Progress Notes (Signed)
Stroke Team Progress Note  HISTORY Jenny Brady is an 79 y.o. female with a past medical history significant for dementia, CAD, CHF, hyperthyroidism, transferred from [facility] for further management of a large left MCA distribution infarct. Patient is not able to provide information and thus all clinical data is gathered from patient's medical record. She appaently lives by herself and on 07/21/2012 she was found by her son unresponsive with some respiratory distress and was taken to [facility] where she was found to be poorly responsive and unable to move the right side. An emergent CT brain revealed a large area of decreased attenuation in the left MCA distribution suggestive of an ischemic infarct. Transferred to [facility] for further management. Patient was not a TPA candidate secondary to unknown time of symptom onset.  SUBJECTIVE No family members present. Dr. Isidoro Donning awaiting son's arrival.  OBJECTIVE Most recent Vital Signs: Filed Vitals:   07/30/12 2114 07/31/12 0137 07/31/12 0500 07/31/12 0534  BP: 98/45 103/48  100/59  Pulse: 85 72  72  Temp: 98.9 F (37.2 C) 98.7 F (37.1 C)  97.9 F (36.6 C)  TempSrc: Axillary Axillary  Axillary  Resp:  18  17  Height:      Weight:   84.188 kg (185 lb 9.6 oz)   SpO2: 98% 100%  100%   CBG (last 3)   Recent Labs  07/31/12 0048 07/31/12 0407 07/31/12 0801  GLUCAP 124* 129* 145*   IV Fluid Intake:   . dextrose 5 % and 0.9% NaCl 50 mL/hr at 07/31/12 0007  . feeding supplement (JEVITY 1.2 CAL) 1,000 mL (07/31/12 0006)  . heparin 1,000 Units/hr (07/30/12 1704)   MEDICATIONS  . free water  150 mL Per Tube Q6H  . levothyroxine  37.5 mcg Intravenous QAC breakfast  . metoprolol  2.5 mg Intravenous Q12H  . pantoprazole (PROTONIX) IV  40 mg Intravenous QHS   PRN:  acetaminophen, acetaminophen, albuterol, ondansetron (ZOFRAN) IV  Diet:  Dysphagia 1 pudding thick liquids Activity:  OOB with assistance DVT Prophylaxis:  SCDs   CLINICALLY SIGNIFICANT  STUDIES Basic Metabolic Panel:   Recent Labs Lab 07/25/12 0515  NA 135  K 4.0  CL 101  CO2 28  GLUCOSE 105*  BUN 15  CREATININE 0.79  CALCIUM 8.5   Liver Function Tests:  No results found for this basename: AST, ALT, ALKPHOS, BILITOT, PROT, ALBUMIN,  in the last 168 hours CBC:   Recent Labs Lab 07/30/12 0425 07/31/12 0637  WBC 5.0 5.0  HGB 11.0* 10.3*  HCT 34.7* 33.3*  MCV 74.8* 74.5*  PLT 259 248   Coagulation: No results found for this basename: LABPROT, INR,  in the last 168 hours Cardiac Enzymes:  No results found for this basename: CKTOTAL, CKMB, CKMBINDEX, TROPONINI,  in the last 168 hours Urinalysis:  No results found for this basename: COLORURINE, APPERANCEUR, LABSPEC, PHURINE, GLUCOSEU, HGBUR, BILIRUBINUR, KETONESUR, PROTEINUR, UROBILINOGEN, NITRITE, LEUKOCYTESUR,  in the last 168 hours Lipid Panel    Component Value Date/Time   CHOL 136 07/22/2012 0032   TRIG 93 07/22/2012 0032   HDL 27* 07/22/2012 0032   CHOLHDL 5.0 07/22/2012 0032   VLDL 19 07/22/2012 0032   LDLCALC 90 07/22/2012 0032   HgbA1C  Lab Results  Component Value Date   HGBA1C 7.3* 07/22/2012   Urine Drug Screen:   No results found for this basename: labopia,  cocainscrnur,  labbenz,  amphetmu,  thcu,  labbarb    Alcohol Level: No results found for this  basename: ETH,  in the last 168 hours  CT of the brain  07/21/2012   1.  Large nonhemorrhagic left middle cerebral artery infarct with early edema without midline shift. 2.  Old left occipital infarct. 3.  Atrophy, most prominent in the temporal lobes.   MRI of the brain  07/22/2012  Motion degraded exam.  Large left middle cerebral artery distribution infarct as noted above.  Thrombus within the left middle cerebral artery suspected with occluded branches distal to this region.    MRA of the brain  1. Left MCA occluded or nearly occluded in the M1 segment just beyond its origin. This corresponds to the earlier MRI findings. Faint more distal left  MCA flow signal; little to no M2 segment  flow signal. 2. Moderately degraded by motion despite repeated imaging  attempts.  2D Echocardiogram  EF 15% with no source of embolus.   Carotid Doppler  No evidence of hemodynamically significant internal carotid artery stenosis. Vertebral artery flow is antegrade.   TEE 07/26/2012 - ejection fraction 20-25%. There was a small mobile structure attached to the anterior wall of the LV. This may have been a loose trabeculation but cannot rule out thrombus given history."Would consider anticoagulation if she would be a safe candidate"  TCD    CXR   07/22/2012    1.  Persistent pulmonary edema. 2.  No change in aeration to the left lower lobe.  07/21/2012  1.  Cardiomegaly and pulmonary edema. 2.  Small left pleural effusion and basilar airspace disease which could be due to atelectasis or pneumonia.   EKG  normal sinus rhythm, LBBB.   Therapy Recommendations CIR, CIR recommends SNF  Physical Exam   General - 79 year old female lying in bed mute with left gaze preference. NG tube now back in place. Heart - Regular with ectopics Lungs - Clear to auscultation anteriorly. Abdomen - Soft - non tender Skin - Warm and dry Obese. 1 to 2 plus edema at the ankles Neurological Exam ;  :  The patient is lethargic, non-verbal. The patient has a pain response with passive movement of any extremity.Left gaze preference, and no blink to threat from the right.  Right hemiparesis, arm> leg, severe. Occasional spontaneous movements of the right foot . Will move the left side. The patient is mute, but will follow command to wiggle toes. Could not ambulate, and the patient will not follow commands for cerebellar testing. DTR are depressed, symmetric, right babinski.   ASSESSMENT Jenny Brady is a 79 y.o. demented female presenting with unresponsiveness and respiratory distress after being found down at home. Imaging confirms a left large MCA infarct.  Infarct embolic secondary to cardiac thrombus due to low EF. On aspirin 81 mg orally every day prior to admission. Now on IV heparin for secondary stroke prevention. Patient with resultant right hemiparesis, global aphasia, dysphagia. SNF recommended. Work up completed.    CAD  LDL 90 - not on any statin medication prior to admission.   HgbA1c 7.3  Severe dysphagia, failed repeat swallow evaluation over the weekend. Now attempting D1 pudding thick liquids to see how she dose. Has tube feedings. Son decising about PEG. Will need PEG for long-term survival.  Obesity, Body mass index is 29.97 kg/(m^2).   Baseline dementia  Community acquired pneumonia, on levaquin  UTI, on levaquin  Cardiomyopathy with EF 15%  Cardiac thrombus per TEE, anticoagulation recommended  Likely obstructive sleep apnea  Son has refused palliative care. Wants aggressive care.  Understands SNF placement will be required at time of discharge as son cannot provide care.  Hospital day # 10  TREATMENT/PLAN  Continue heparin for secondary stroke prevention. Recommend oral anticoagulation once able to swallow or PEG placed  PEG placement recommended. Dr. Isidoro Donning awaiting son's arrival to discuss with him SNF at discharge  [facility], MSN, RN, ANVP-BC, ANP-BC, GNP-BC [facility] Pager: [phone] 07/31/2012 11:22 AM  I have personally obtained a history, examined the patient, evaluated imaging results, and formulated the assessment and plan of care. I agree with the above.  Delia Heady, MD

## 2012-08-01 ENCOUNTER — Encounter (HOSPITAL_COMMUNITY): Payer: Self-pay | Admitting: Radiology

## 2012-08-01 LAB — HEPARIN LEVEL (UNFRACTIONATED)
Heparin Unfractionated: 0.25 IU/mL — ABNORMAL LOW (ref 0.30–0.70)
Heparin Unfractionated: 0.6 IU/mL (ref 0.30–0.70)

## 2012-08-01 LAB — CBC
Hemoglobin: 10.2 g/dL — ABNORMAL LOW (ref 12.0–15.0)
MCH: 23.1 pg — ABNORMAL LOW (ref 26.0–34.0)
MCHC: 31 g/dL (ref 30.0–36.0)
RDW: 16 % — ABNORMAL HIGH (ref 11.5–15.5)

## 2012-08-01 LAB — GLUCOSE, CAPILLARY: Glucose-Capillary: 136 mg/dL — ABNORMAL HIGH (ref 70–99)

## 2012-08-01 MED ORDER — CEFAZOLIN SODIUM-DEXTROSE 2-3 GM-% IV SOLR
2.0000 g | INTRAVENOUS | Status: AC
  Administered 2012-08-02: 2 g via INTRAVENOUS
  Filled 2012-08-01: qty 50

## 2012-08-01 MED ORDER — HEPARIN (PORCINE) IN NACL 100-0.45 UNIT/ML-% IJ SOLN
1100.0000 [IU]/h | INTRAMUSCULAR | Status: AC
  Administered 2012-08-01: 1150 [IU]/h via INTRAVENOUS
  Administered 2012-08-01: 1100 [IU]/h via INTRAVENOUS
  Filled 2012-08-01: qty 250

## 2012-08-01 NOTE — Progress Notes (Signed)
,Patient ID: Jenny Brady  female  ZOX:[id number]    DOB: Mar 24, 1926    DOA: 07/21/2012  PCP: [name], MD  Interim Summary: 79 yo female w/hx pf CAD, dementia came in on 4/13 for resp distress & R facial droop/hemiparesis.  Pt's workup found to have large left MCA CVA secondary to possible intracardiac thrombus.  She also failed swallow eval initially, temporary feeding tube placed on 07/23/12. Patient was finally placed on dysphagia 1 diet with pudding thick liquids by speech therapy on 07/29/12.  Assessment/Plan:    Acute ischemic large left MCA stroke:  - CT head was obtained which showed large left MCA distribution infarct, most likely embolic. Not a candidate for thrombolytics due to unknown time of onset, delayed presentation. - Currently on heparin drip - MRI of the brain showed large left hemispheric middle cerebral artery distribution infarct involving left temporal lobe, left frontal lobe and left opercular/sub insular region, involvement of the left lenticular nucleus/left corona radiata with local mass effect. Thrombus within the left middle cerebral artery suspected with occluded branches distal to this region. -  2-D echo showed EF of 15%, severe tricuspid regurgitation, moderate PHTN, PAP 62, moderate MR -  carotid Dopplers showed no ICA stenosis, antegrade vertebral flow - TEE on 4/18 showed possible intracardiac thrombus, Dr Rito Ehrlich discussed with cardiology and  Dr Shirlee Latch feels that the patient does have intracardiac thrombus.  -  Cont heparin drip per cardiology and the stroke service recommendations, eventually will need anticoagulation once able to take PO or PEG tube  Dysphagia: From CVA - Currently on Jevity via temporary feeding tube. Even though patient was placed on dysphagia 1 diet, her oral intake has remained inconsistent and poor - I met with the son today in the room, he attempted to feed her, she did somewhat better today with him as he was able to  communicate with her in native language [place]), however she did eat less than 50%. He understands that she will need PEG for long-term survival and gave consent with hope that she will be able to advance her diet in future.  - IR consult placed for G Tube placement.      DEMENTIA-mod-severe: Patient is much more alert today, follow some commands. She is also more alert in her son's presence when spoken to her in [place] language.   Hyperthyroidism-s/p Sven Pinheiro admin: Now hypothyroidism, TSH 13.9 - On IV Synthroid 37.5 mcg daily.    Coronary artery disease with acute on chronic systolic CHF (EF was 25-30% in 2012) - 2-D echo shows EF of 15% , BP currently borderline low  CAP (community acquired pneumonia)- left lung Patient has completed 8 days of antibiotics.      UTI (lower urinary tract infection):  - Urine culture showed no growth  DVT Prophylaxis: SCD's  Code Status:  Full code  Disposition: Per patient's son, Jenny Brady  Goals of care  1) he wants her to be FULL CODE STATUS and continue aggressive care "to keep her alive and going" 2) okay for GTube 3) skilled nursing placement   Subjective: alert and awake today, follow some verbal commands, able to communicate better with son  Objective: Weight change: -0.318 kg (-11.2 oz)  Intake/Output Summary (Last 24 hours) at 08/01/12 1048 Last data filed at 08/01/12 0524  Gross per 24 hour  Intake    480 ml  Output   2225 ml  Net  -1745 ml   Blood pressure 119/60, pulse 79, temperature  98.4 F (36.9 C), temperature source Axillary, resp. rate 18, height 5\' 6"  (1.676 m), weight 83.87 kg (184 lb 14.4 oz), SpO2 97.00%.  Physical Exam: General:  alert and awake, says few mono syllables with son  CVS: S1-S2 clear Chest: CTAB Abdomen: obese, soft, nontender Extremities: no c/c/e Neuro: Follow some verbal commands, left gaze preference, tracks, wiggles toes, right hemiparesis  Lab Results: Basic Metabolic Panel: No results  found for this basename: NA, K, CL, CO2, GLUCOSE, BUN, CREATININE, CALCIUM, MG, PHOS,  in the last 168 hours CBC:  Recent Labs Lab 07/31/12 0637 08/01/12 0705  WBC 5.0 5.1  HGB 10.3* 10.2*  HCT 33.3* 32.9*  MCV 74.5* 74.6*  PLT 248 252   CBG:  Recent Labs Lab 07/31/12 1612 07/31/12 2148 08/01/12 0006 08/01/12 0516 08/01/12 0756  GLUCAP 142* 141* 136* 128* 136*     Studies/Results: Dg Chest 1 View  07/21/2012   IMPRESSION:  1.  Cardiomegaly and pulmonary edema. 2.  Small left pleural effusion and basilar airspace disease which could be due to atelectasis or pneumonia.   Original Report Authenticated By: Holley Dexter, M.D.    Dg Chest 2 View  07/22/2012   IMPRESSION:  1.  Persistent pulmonary edema. 2.  No change in aeration to the left lower lobe.   Original Report Authenticated By: Signa Kell, M.D.    Ct Head Wo Contrast  07/21/2012  *RADIOLOGY REPORT*  Clinical Data: Altered mental status.  The patient is unresponsive.  CT HEAD WITHOUT CONTRAST  Technique:  Contiguous axial images were obtained from the base of the skull through the vertex without contrast.  Comparison: 02/09/2008  Findings: The patient has an acute large non hemorrhagic left middle cerebral artery distribution infarct with slight edema.  No midline shift at this time. There is a hyper dense branch of the left middle cerebral artery seen on image number 11 of series 2, consistent with acute thrombosis.  There is an old left occipital infarct, new since 02/09/2008.  Diffuse cerebral cortical atrophy, most prominent in the temporal lobes.  No significant osseous abnormality.  IMPRESSION:  1.  Large nonhemorrhagic left middle cerebral artery infarct with early edema without midline shift. 2.  Old left occipital infarct. 3.  Atrophy, most prominent in the temporal lobes.   Original Report Authenticated By: Francene Boyers, M.D.    Mr Brain Wo Contrast  07/22/2012  *RADIOLOGY REPORT*  Clinical Data: Altered  mental status.  Abnormal CT.  MRI HEAD WITHOUT CONTRAST  Technique:  Multiplanar, multiecho pulse sequences of the brain and surrounding structures were obtained according to standard protocol without intravenous contrast.  Comparison: 07/21/2012 CT.  03/08/2004 MR.  Findings: Motion degraded exam.  MR angiogram was ordered although the patient not able to cooperate for this exam.  Large left hemispheric middle cerebral artery distribution infarct involving left temporal lobe, left frontal lobe and left opercular/sub insular region.  Involvement of the left lenticular nucleus/left corona radiata.  No intracranial hemorrhage.  Local mass effect without midline shift.  Remote left occipital lobe infarct with encephalomalacia.  Mild small vessel disease type changes.  Global atrophy without hydrocephalus.  No intracranial mass lesion detected on this unenhanced exam.  Limited evaluation of the intracranial vascular structures secondary to the motion.  Thrombus within the left middle cerebral artery suspected with occluded branches distal to this region.  IMPRESSION: Motion degraded exam.  Large left middle cerebral artery distribution infarct as noted above.  Thrombus within the left middle cerebral artery  suspected with occluded branches distal to this region.  This has been made a PRA call report utilizing dashboard call feature.   Original Report Authenticated By: Lacy Duverney, M.D.     Medications: Scheduled Meds: . [START ON 08/02/2012]  ceFAZolin (ANCEF) IV  2 g Intravenous On Call to OR  . free water  150 mL Per Tube Q6H  . levothyroxine  37.5 mcg Intravenous QAC breakfast  . metoprolol  2.5 mg Intravenous Q12H  . pantoprazole (PROTONIX) IV  40 mg Intravenous QHS      LOS: 11 days   Myrtle Barnhard M.D.  [facility]  08/01/2012, 10:48 AM  Pager: [phone]   If 7PM-7AM, please contact night-coverage [email or web address] Password [email or web address]

## 2012-08-01 NOTE — Progress Notes (Signed)
NUTRITION FOLLOW UP  Intervention:   Resume Jevity 1.2 @ 60 ml/hr with 150 ml H2O flush every 6 hours once PEG ready for use.   TF regimen provides 1728 kcal, 80 grams protein, 1166 ml H2O; total free water 1766 ml   Nutrition Dx:   Inadequate oral intake related to inability to eat as evidenced by NPO status; ongoing.  Goal:  Pt to meet >/= 90% of their estimated nutrition needs; met.   Monitor:  TF tolerance, weight trend, labs  Assessment:   Pt admitted with large MCA stroke. Pt discussed during rounds and with RN. Diet advanced by SLP this am to Dysphagia 1 with Pudding thick liquids, but unable to consume adequate nutrition consistently.   PEG planned for today.      Height: Ht Readings from Last 1 Encounters:  07/22/12 5\' 6"  (1.676 m)    Weight Status:   Wt Readings from Last 1 Encounters:  08/01/12 184 lb 14.4 oz (83.87 kg)  Admission weight 188 lb 4/14  Re-estimated needs:  Kcal: 1600-1750  Protein: 80-90 grams  Fluid: > 1.6 L/day  Skin: no issues noted  Diet Order: Dysphagia 1 with Pudding Thick Liquids Meal Completion: <50%    Intake/Output Summary (Last 24 hours) at 08/01/12 1601 Last data filed at 08/01/12 0524  Gross per 24 hour  Intake      0 ml  Output   1575 ml  Net  -1575 ml    Last BM: 4/23   Labs:  No results found for this basename: NA, K, CL, CO2, BUN, CREATININE, CALCIUM, MG, PHOS, GLUCOSE,  in the last 168 hours  CBG (last 3)   Recent Labs  08/01/12 0516 08/01/12 0756 08/01/12 1221  GLUCAP 128* 136* 149*    Scheduled Meds: . [START ON 08/02/2012]  ceFAZolin (ANCEF) IV  2 g Intravenous On Call to OR  . free water  150 mL Per Tube Q6H  . levothyroxine  37.5 mcg Intravenous QAC breakfast  . metoprolol  2.5 mg Intravenous Q12H  . pantoprazole (PROTONIX) IV  40 mg Intravenous QHS    Continuous Infusions: . dextrose 5 % and 0.9% NaCl 50 mL/hr at 07/31/12 2217  . feeding supplement (JEVITY 1.2 CAL) 1,000 mL (07/31/12  2218)  . heparin      Kendell Bane RD, LDN, CNSC [phone] Pager [phone] After Hours Pager

## 2012-08-01 NOTE — Clinical Social Work Note (Signed)
Clinical Social Work   CSW contacted pt's son to confirm bed choice. Pt's son inquired about [facility], however they have not responded. Pt's son expressed that his father was a [employer]. CSW contacted facility and no Medicare bed are available, however pt can admit under private pay and then transfer to into a Medicare bed (regardless of [facility] status). CSW attempted to reach pt's son to update him, however was unable to speak to him. Pt is for PEG placement. CSW will continue to follow.   Dede Query, MSW, LCSW [phone]

## 2012-08-01 NOTE — Progress Notes (Signed)
ANTICOAGULATION CONSULT NOTE-Follow up Consult Heparin for Large L-MCA thrombus due to intracardiac thrombus.  Heparin level = 0.6 on 1150 units/hr Goal heparin level = 0.3-0.5  Heparin level is slightly above desired level. Will decrease slightly to 1100 units/hr. Heparin to be discontinued prior to PEG placement in the AM.  Cardell Peach, PharmD

## 2012-08-01 NOTE — H&P (Signed)
Jenny Brady is an 79 y.o. female.   Chief Complaint: recent CVA No response; no movement Dysphagia; failed swallow study Pt on Heparin IV- probable intracardiac thrombus Scheduled for percutaneous gastric tube in IR 4/25 Need for long term survival; poss SNF HPI: CVA; dysphagia; hyperthyroid; CAD   Past Medical History  Diagnosis Date  . Hyperthyroidism     had RAI-is on thyroid replacement, happened maybe this summer  . Coronary artery disease     Had a catheterization per son by Dr. [name] in 1991 no reports in  [place]  . GERD (gastroesophageal reflux disease)     Past Surgical History  Procedure Laterality Date  . None    . Tee without cardioversion N/A 07/26/2012    Procedure: TRANSESOPHAGEAL ECHOCARDIOGRAM (TEE);  Surgeon: Laurey Morale, MD;  Location: [facility] ENDOSCOPY;  Service: Cardiovascular;  Laterality: N/A;    Family History  Problem Relation Age of Onset  . Acne Mother     died giving birth to 8th kid  . Dementia Father     died 57  . Acne Brother     died 2-3 yrs ago  . Cervical cancer Sister    Social History:  reports that she has never smoked. She has never used smokeless tobacco. She reports that she does not drink alcohol or use illicit drugs.  Allergies:  Allergies  Allergen Reactions  . Hyoscyamine   . Ivp Dye (Iodinated Diagnostic Agents)   . Septra (Sulfamethoxazole W-Trimethoprim)     Medications Prior to Admission  Medication Sig Dispense Refill  . ALPRAZolam (XANAX) 0.5 MG tablet Take 0.5 mg by mouth at bedtime as needed for sleep.      Marland Kitchen aspirin EC 81 MG tablet Take 81 mg by mouth daily.        . carvedilol (COREG) 6.25 MG tablet Take 1 tablet (6.25 mg total) by mouth 2 (two) times daily with a meal.  30 tablet  0  . furosemide (LASIX) 40 MG tablet Take 1 tablet (40 mg total) by mouth daily.  30 tablet  0  . levothyroxine (SYNTHROID, LEVOTHROID) 112 MCG tablet Take 56 mcg by mouth daily.       . memantine (NAMENDA) 5 MG tablet  Take 1 tablet (5 mg total) by mouth 2 (two) times daily.  30 tablet  0  . potassium chloride SA (K-DUR,KLOR-CON) 20 MEQ tablet Take 1 tablet (20 mEq total) by mouth 2 (two) times daily.  60 tablet  0  . cholecalciferol (VITAMIN D) 1000 UNITS tablet Take 1,000 Units by mouth daily.        . Cyanocobalamin (VITAMIN B-12 PO) Take 1 tablet by mouth daily.          Results for orders placed during the hospital encounter of 07/21/12 (from the past 48 hour(s))  GLUCOSE, CAPILLARY     Status: Abnormal   Collection Time    07/30/12 11:42 AM      Result Value Range   Glucose-Capillary 149 (*) 70 - 99 mg/dL  GLUCOSE, CAPILLARY     Status: Abnormal   Collection Time    07/30/12  4:37 PM      Result Value Range   Glucose-Capillary 176 (*) 70 - 99 mg/dL  GLUCOSE, CAPILLARY     Status: Abnormal   Collection Time    07/30/12  7:59 PM      Result Value Range   Glucose-Capillary 129 (*) 70 - 99 mg/dL  GLUCOSE, CAPILLARY  Status: Abnormal   Collection Time    07/31/12 12:48 AM      Result Value Range   Glucose-Capillary 124 (*) 70 - 99 mg/dL  GLUCOSE, CAPILLARY     Status: Abnormal   Collection Time    07/31/12  4:07 AM      Result Value Range   Glucose-Capillary 129 (*) 70 - 99 mg/dL  CBC     Status: Abnormal   Collection Time    07/31/12  6:37 AM      Result Value Range   WBC 5.0  4.0 - 10.5 K/uL   RBC 4.47  3.87 - 5.11 MIL/uL   Hemoglobin 10.3 (*) 12.0 - 15.0 g/dL   HCT 16.1 (*) 09.6 - 04.5 %   MCV 74.5 (*) 78.0 - 100.0 fL   MCH 23.0 (*) 26.0 - 34.0 pg   MCHC 30.9  30.0 - 36.0 g/dL   RDW 40.9 (*) 81.1 - 91.4 %   Platelets 248  150 - 400 K/uL  HEPARIN LEVEL (UNFRACTIONATED)     Status: None   Collection Time    07/31/12  6:37 AM      Result Value Range   Heparin Unfractionated 0.45  0.30 - 0.70 IU/mL   Comment:            IF HEPARIN RESULTS ARE BELOW     EXPECTED VALUES, AND PATIENT     DOSAGE HAS BEEN CONFIRMED,     SUGGEST FOLLOW UP TESTING     OF ANTITHROMBIN III LEVELS.   GLUCOSE, CAPILLARY     Status: Abnormal   Collection Time    07/31/12  8:01 AM      Result Value Range   Glucose-Capillary 145 (*) 70 - 99 mg/dL  GLUCOSE, CAPILLARY     Status: Abnormal   Collection Time    07/31/12 12:36 PM      Result Value Range   Glucose-Capillary 138 (*) 70 - 99 mg/dL  GLUCOSE, CAPILLARY     Status: Abnormal   Collection Time    07/31/12  4:12 PM      Result Value Range   Glucose-Capillary 142 (*) 70 - 99 mg/dL  GLUCOSE, CAPILLARY     Status: Abnormal   Collection Time    07/31/12  9:48 PM      Result Value Range   Glucose-Capillary 141 (*) 70 - 99 mg/dL   Comment 1 Notify RN    GLUCOSE, CAPILLARY     Status: Abnormal   Collection Time    08/01/12 12:06 AM      Result Value Range   Glucose-Capillary 136 (*) 70 - 99 mg/dL   Comment 1 Notify RN    GLUCOSE, CAPILLARY     Status: Abnormal   Collection Time    08/01/12  5:16 AM      Result Value Range   Glucose-Capillary 128 (*) 70 - 99 mg/dL   Comment 1 Notify RN    CBC     Status: Abnormal   Collection Time    08/01/12  7:05 AM      Result Value Range   WBC 5.1  4.0 - 10.5 K/uL   RBC 4.41  3.87 - 5.11 MIL/uL   Hemoglobin 10.2 (*) 12.0 - 15.0 g/dL   HCT 78.2 (*) 95.6 - 21.3 %   MCV 74.6 (*) 78.0 - 100.0 fL   MCH 23.1 (*) 26.0 - 34.0 pg   MCHC 31.0  30.0 - 36.0 g/dL  RDW 16.0 (*) 11.5 - 15.5 %   Platelets 252  150 - 400 K/uL  HEPARIN LEVEL (UNFRACTIONATED)     Status: Abnormal   Collection Time    08/01/12  7:05 AM      Result Value Range   Heparin Unfractionated 0.25 (*) 0.30 - 0.70 IU/mL   Comment:            IF HEPARIN RESULTS ARE BELOW     EXPECTED VALUES, AND PATIENT     DOSAGE HAS BEEN CONFIRMED,     SUGGEST FOLLOW UP TESTING     OF ANTITHROMBIN III LEVELS.  GLUCOSE, CAPILLARY     Status: Abnormal   Collection Time    08/01/12  7:56 AM      Result Value Range   Glucose-Capillary 136 (*) 70 - 99 mg/dL   No results found.  Review of Systems  Constitutional: Negative for fever.   Respiratory: Negative for shortness of breath.   Gastrointestinal: Negative for nausea and vomiting.  Neurological: Positive for weakness.    Blood pressure 104/48, pulse 67, temperature 98.3 F (36.8 C), temperature source Axillary, resp. rate 18, height 5\' 6"  (1.676 m), weight 184 lb 14.4 oz (83.87 kg), SpO2 100.00%. Physical Exam  Constitutional: She appears well-nourished.  Cardiovascular: Normal rate, regular rhythm and normal heart sounds.   No murmur heard. Respiratory: Effort normal and breath sounds normal.  GI: Soft. Bowel sounds are normal. There is no tenderness.  Obese; No visible surgical scars  Musculoskeletal:  No response; no movement  Psychiatric:  Consented son over phone     Assessment/Plan CVA; dysphagia No response Failed swallow study Scheduled for Per G tube in IR 4/25 Long term use On hep - intracardiac thrombus Orders to hold in am prior to G tube Ancef ordered Check kub Check labs Son consented over phone  Scarlettrose Costilow A 08/01/2012, 10:07 AM

## 2012-08-01 NOTE — H&P (Signed)
Agree with PA note.    Signed,  Kellianne Ek K. Jamel Dunton, MD Vascular & Interventional Radiologist [facility]  

## 2012-08-01 NOTE — Progress Notes (Signed)
Speech Language Pathology Dysphagia Treatment Patient Details Name: Jenny Brady MRN: [id number] DOB: 06/20/1925 Today's Date: 08/01/2012 Time: 7829-5621 SLP Time Calculation (min): 11 min  Assessment / Plan / Recommendation Clinical Impression  Therapeutic intervention focused on facilitating oral intake of PO. Pt awakened easily. Accepted 4 bites of magic cup with max primarily tactile cues. Pt appeared to indicate "yes" with gesture and minimal verbalization with max question and visual cues. Pt accepting PO but not progressing to more automatic intake. MD and son plan for PEG tomorrow. SLP will continue to follow, attempting liquid textures at next visit.     Diet Recommendation  Continue with Current Diet: Dysphagia 1 (puree);Pudding-thick liquid    SLP Plan Continue with current plan of care   Pertinent Vitals/Pain NA   Swallowing Goals  SLP Swallowing Goals Patient will utilize recommended strategies during swallow to increase swallowing safety with: Maximum assistance Swallow Study Goal #2 - Progress: Progressing toward goal  General Temperature Spikes Noted: No Respiratory Status: Supplemental O2 delivered via (comment) Behavior/Cognition: Alert;Requires cueing Oral Cavity - Dentition: Adequate natural dentition Patient Positioning: Upright in bed  Oral Cavity - Oral Hygiene Does patient have any of the following "at risk" factors?: Nutritional status - inadequate Patient is AT RISK - Oral Care Protocol followed (see row info): Yes   Dysphagia Treatment Treatment focused on: Skilled observation of diet tolerance;Facilitation of oral preparatory phase;Facilitation of oral phase;Utilization of compensatory strategies Treatment Methods/Modalities: Skilled observation Patient observed directly with PO's: Yes Type of PO's observed: Dysphagia 1 (puree) Feeding: Total assist Liquids provided via: Teaspoon Oral Phase Signs & Symptoms: Anterior loss/spillage;Prolonged  bolus formation;Prolonged oral phase Pharyngeal Phase Signs & Symptoms: Suspected delayed swallow initiation;Multiple swallows;Delayed cough Type of cueing: Verbal;Tactile;Visual Amount of cueing: Maximal   GO    Harlon Ditty, MA CCC-SLP [phone]  [name] 08/01/2012, 11:38 AM

## 2012-08-01 NOTE — Progress Notes (Signed)
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  Medications:  Infusions:  . Heparin 1,000 Units/hr     Assessment:  79 y/o female continues on Heparin for a Large L-MCA thrombus due to intracardiac thrombus  Heparin level dropped slightly below therapeutic range.  No reported interruptions in the infusion.  No bleeding complications noted   Heparin will be discontinued prior to planned PEG placement in AM   Goal of Therapy:  Heparin level 0.3 - 0.5 units/ml   Plan:   Increase Heparin infusion rate to 1150 units/hr. Will check Heparin Level in 8 hours  Stop Heparin infusion 08/02/12 @ 0700 for PED placement.  Yechiel Erny, Elisha Headland, Pharm.D.  08/01/2012,10:58 AM

## 2012-08-01 NOTE — Consult Note (Cosign Needed)
Kharee Lesesne EdD 

## 2012-08-01 NOTE — Progress Notes (Signed)
Patient refuses CPAP. Patient is uncomfortable and has NG tube.

## 2012-08-01 NOTE — Progress Notes (Signed)
Stroke Team Progress Note  HISTORY Jenny Brady is an 79 y.o. female with a past medical history significant for dementia, CAD, CHF, hyperthyroidism, transferred from [facility] for further management of a large left MCA distribution infarct. Patient is not able to provide information and thus all clinical data is gathered from patient's medical record. She appaently lives by herself and on 07/21/2012 she was found by her son unresponsive with some respiratory distress and was taken to [facility] where she was found to be poorly responsive and unable to move the right side. An emergent CT brain revealed a large area of decreased attenuation in the left MCA distribution suggestive of an ischemic infarct. Transferred to [facility] for further management. Patient was not a TPA candidate secondary to unknown time of symptom onset.  SUBJECTIVE No family members present. Dr. Isidoro Donning has obtained consent from son for PEG.  OBJECTIVE Most recent Vital Signs: Filed Vitals:   07/31/12 2146 08/01/12 0237 08/01/12 0517 08/01/12 0930  BP: 113/53 108/56 104/48 119/60  Pulse: 79 78 67 79  Temp: 99.6 F (37.6 C) 98.7 F (37.1 C) 98.3 F (36.8 C) 98.4 F (36.9 C)  TempSrc: Axillary Axillary Axillary Axillary  Resp: 20 20 18 18   Height:      Weight:   83.87 kg (184 lb 14.4 oz)   SpO2: 100% 99% 100% 97%   CBG (last 3)   Recent Labs  08/01/12 0006 08/01/12 0516 08/01/12 0756  GLUCAP 136* 128* 136*   IV Fluid Intake:   . dextrose 5 % and 0.9% NaCl 50 mL/hr at 07/31/12 2217  . feeding supplement (JEVITY 1.2 CAL) 1,000 mL (07/31/12 2218)  . heparin     MEDICATIONS  . [START ON 08/02/2012]  ceFAZolin (ANCEF) IV  2 g Intravenous On Call to OR  . free water  150 mL Per Tube Q6H  . levothyroxine  37.5 mcg Intravenous QAC breakfast  . metoprolol  2.5 mg Intravenous Q12H  . pantoprazole (PROTONIX) IV  40 mg Intravenous QHS   PRN:  acetaminophen, acetaminophen, albuterol, ondansetron (ZOFRAN) IV, sodium chloride  Diet:   Dysphagia 1 pudding thick liquids Activity:  OOB with assistance DVT Prophylaxis:  SCDs   CLINICALLY SIGNIFICANT STUDIES Basic Metabolic Panel:  No results found for this basename: NA, K, CL, CO2, GLUCOSE, BUN, CREATININE, CALCIUM, MG, PHOS,  in the last 168 hours Liver Function Tests:  No results found for this basename: AST, ALT, ALKPHOS, BILITOT, PROT, ALBUMIN,  in the last 168 hours CBC:   Recent Labs Lab 07/31/12 0637 08/01/12 0705  WBC 5.0 5.1  HGB 10.3* 10.2*  HCT 33.3* 32.9*  MCV 74.5* 74.6*  PLT 248 252   Coagulation: No results found for this basename: LABPROT, INR,  in the last 168 hours Cardiac Enzymes:  No results found for this basename: CKTOTAL, CKMB, CKMBINDEX, TROPONINI,  in the last 168 hours Urinalysis:  No results found for this basename: COLORURINE, APPERANCEUR, LABSPEC, PHURINE, GLUCOSEU, HGBUR, BILIRUBINUR, KETONESUR, PROTEINUR, UROBILINOGEN, NITRITE, LEUKOCYTESUR,  in the last 168 hours Lipid Panel    Component Value Date/Time   CHOL 136 07/22/2012 0032   TRIG 93 07/22/2012 0032   HDL 27* 07/22/2012 0032   CHOLHDL 5.0 07/22/2012 0032   VLDL 19 07/22/2012 0032   LDLCALC 90 07/22/2012 0032   HgbA1C  Lab Results  Component Value Date   HGBA1C 7.3* 07/22/2012   Urine Drug Screen:   No results found for this basename: labopia,  cocainscrnur,  labbenz,  amphetmu,  thcu,  labbarb    Alcohol Level: No results found for this basename: ETH,  in the last 168 hours  CT of the brain  07/21/2012   1.  Large nonhemorrhagic left middle cerebral artery infarct with early edema without midline shift. 2.  Old left occipital infarct. 3.  Atrophy, most prominent in the temporal lobes.   MRI of the brain  07/22/2012  Motion degraded exam.  Large left middle cerebral artery distribution infarct as noted above.  Thrombus within the left middle cerebral artery suspected with occluded branches distal to this region.    MRA of the brain  1. Left MCA occluded or nearly occluded  in the M1 segment just beyond its origin. This corresponds to the earlier MRI findings. Faint more distal left MCA flow signal; little to no M2 segment  flow signal. 2. Moderately degraded by motion despite repeated imaging  attempts.  2D Echocardiogram  EF 15% with no source of embolus.   Carotid Doppler  No evidence of hemodynamically significant internal carotid artery stenosis. Vertebral artery flow is antegrade.   TEE 07/26/2012 - ejection fraction 20-25%. There was a small mobile structure attached to the anterior wall of the LV. This may have been a loose trabeculation but cannot rule out thrombus given history."Would consider anticoagulation if she would be a safe candidate"  TCD  Normal mean flow velocities in right middle cerebral, basilar, right vertebral, bilateral carotid siphons and opthalmic arteries. Suboptimal windows throghout limits exam.   CXR   07/22/2012    1.  Persistent pulmonary edema. 2.  No change in aeration to the left lower lobe.  07/21/2012  1.  Cardiomegaly and pulmonary edema. 2.  Small left pleural effusion and basilar airspace disease which could be due to atelectasis or pneumonia.   EKG  normal sinus rhythm, LBBB.   Therapy Recommendations CIR, CIR recommends SNF  Physical Exam   General - 79 year old female lying in bed mute with left gaze preference. NG tube now back in place. Heart - Regular with ectopics Lungs - Clear to auscultation anteriorly. Abdomen - Soft - non tender Skin - Warm and dry Obese. 1 to 2 plus edema at the ankles Neurological Exam ;  :  The patient is lethargic, non-verbal. The patient has a pain response with passive movement of any extremity.Left gaze preference, and no blink to threat from the right.  Right hemiparesis, arm> leg, severe. Occasional spontaneous movements of the right foot . Will move the left side. The patient is mute, but will follow command to wiggle toes. Could not ambulate, and the patient will not follow  commands for cerebellar testing. DTR are depressed, symmetric, right babinski.   ASSESSMENT [name] is a 79 y.o. demented female presenting with unresponsiveness and respiratory distress after being found down at home. Imaging confirms a left large MCA infarct. Infarct embolic due to cardiac thrombus secondary to low EF. On aspirin 81 mg orally every day prior to admission. Now on IV heparin for secondary stroke prevention. Patient with resultant right hemiparesis, global aphasia, dysphagia. SNF recommended.     CAD  LDL 90 - not on any statin medication prior to admission.   HgbA1c 7.3  Severe dysphagia, on D1 pudding thick liquids, but overall intake poor. Also has tube feedings. Son agreed to PEG. Obesity, Body mass index is 29.86 kg/(m^2).   Baseline dementia  Community acquired pneumonia, on levaquin  UTI, on levaquin  Cardiomyopathy with EF 15%  Cardiac thrombus  per TEE, anticoagulation recommended  Obstructive sleep apnea, refuses c-pap  Son has refused palliative care. Wants aggressive care. Understands SNF placement will be required at time of discharge as son cannot provide care.  Hospital day # 11  TREATMENT/PLAN  Continue heparin for secondary stroke prevention. Recommend oral anticoagulation once PEG placed PEG placement Tues by radiology SNF at discharge  [name], MSN, RN, ANVP-BC, ANP-BC, GNP-BC [facility] Pager: [phone] 08/01/2012 12:02 PM  I have personally obtained a history, examined the patient, evaluated imaging results, and formulated the assessment and plan of care. I agree with the above. Delia Heady, MD

## 2012-08-02 ENCOUNTER — Inpatient Hospital Stay (HOSPITAL_COMMUNITY): Payer: Medicare Other

## 2012-08-02 HISTORY — PX: OTHER SURGICAL HISTORY: SHX169

## 2012-08-02 LAB — CBC
MCH: 23.6 pg — ABNORMAL LOW (ref 26.0–34.0)
MCHC: 32 g/dL (ref 30.0–36.0)
MCV: 73.7 fL — ABNORMAL LOW (ref 78.0–100.0)
Platelets: 247 10*3/uL (ref 150–400)
RBC: 4.45 MIL/uL (ref 3.87–5.11)

## 2012-08-02 LAB — GLUCOSE, CAPILLARY
Glucose-Capillary: 132 mg/dL — ABNORMAL HIGH (ref 70–99)
Glucose-Capillary: 147 mg/dL — ABNORMAL HIGH (ref 70–99)

## 2012-08-02 LAB — APTT: aPTT: 88 s — ABNORMAL HIGH (ref 24–37)

## 2012-08-02 MED ORDER — IOHEXOL 300 MG/ML  SOLN
50.0000 mL | Freq: Once | INTRAMUSCULAR | Status: AC | PRN
  Administered 2012-08-02: 30 mL via INTRAVENOUS

## 2012-08-02 MED ORDER — MIDAZOLAM HCL 2 MG/2ML IJ SOLN
INTRAMUSCULAR | Status: AC | PRN
  Administered 2012-08-02: 0.5 mg via INTRAVENOUS

## 2012-08-02 MED ORDER — GLUCAGON HCL (RDNA) 1 MG IJ SOLR
INTRAMUSCULAR | Status: AC | PRN
  Administered 2012-08-02: 1 mg via INTRAVENOUS

## 2012-08-02 MED ORDER — HEPARIN (PORCINE) IN NACL 100-0.45 UNIT/ML-% IJ SOLN
1200.0000 [IU]/h | INTRAMUSCULAR | Status: DC
  Administered 2012-08-02 – 2012-08-05 (×4): 1100 [IU]/h via INTRAVENOUS
  Administered 2012-08-06 (×2): 1250 [IU]/h via INTRAVENOUS
  Filled 2012-08-02 (×8): qty 250

## 2012-08-02 MED ORDER — LORAZEPAM 2 MG/ML IJ SOLN: INTRAMUSCULAR | Status: AC | PRN

## 2012-08-02 MED ORDER — DIPHENHYDRAMINE HCL 50 MG/ML IJ SOLN
INTRAMUSCULAR | Status: AC | PRN
  Administered 2012-08-02: 50 mg via INTRAVENOUS

## 2012-08-02 MED ORDER — FENTANYL CITRATE 0.05 MG/ML IJ SOLN
INTRAMUSCULAR | Status: AC | PRN
  Administered 2012-08-02: 25 ug via INTRAVENOUS

## 2012-08-02 NOTE — Progress Notes (Signed)
Stroke Team Progress Note  HISTORY Jenny Brady is an 79 y.o. female with a past medical history significant for dementia, CAD, CHF, hyperthyroidism, transferred from [facility] for further management of a large left MCA distribution infarct. Patient is not able to provide information and thus all clinical data is gathered from patient's medical record. She appaently lives by herself and on 07/21/2012 she was found by her son unresponsive with some respiratory distress and was taken to [facility] where she was found to be poorly responsive and unable to move the right side. An emergent CT brain revealed a large area of decreased attenuation in the left MCA distribution suggestive of an ischemic infarct. Transferred to [facility] for further management. Patient was not a TPA candidate secondary to unknown time of symptom onset.  SUBJECTIVE Patient just back from getting PEG.  OBJECTIVE Most recent Vital Signs: Filed Vitals:   08/02/12 1003 08/02/12 1005 08/02/12 1010 08/02/12 1016  BP: 131/66 119/70 132/63 113/53  Pulse: 91 87 85 78  Temp:      TempSrc:      Resp: 17 21 17 15   Height:      Weight:      SpO2: 99% 95% 98% 98%   CBG (last 3)   Recent Labs  08/01/12 1608 08/01/12 2112 08/02/12 0642  GLUCAP 137* 139* 132*   IV Fluid Intake:   . dextrose 5 % and 0.9% NaCl 50 mL/hr at 08/01/12 1949  . feeding supplement (JEVITY 1.2 CAL) 1,000 mL (07/31/12 2218)   MEDICATIONS  . free water  150 mL Per Tube Q6H  . levothyroxine  37.5 mcg Intravenous QAC breakfast  . metoprolol  2.5 mg Intravenous Q12H  . pantoprazole (PROTONIX) IV  40 mg Intravenous QHS   PRN:  acetaminophen, acetaminophen, albuterol, ondansetron (ZOFRAN) IV, sodium chloride  Diet:  NPO  Activity:  OOB with assistance DVT Prophylaxis:  SCDs   CLINICALLY SIGNIFICANT STUDIES Basic Metabolic Panel:  No results found for this basename: NA, K, CL, CO2, GLUCOSE, BUN, CREATININE, CALCIUM, MG, PHOS,  in the last 168 hours Liver Function  Tests:  No results found for this basename: AST, ALT, ALKPHOS, BILITOT, PROT, ALBUMIN,  in the last 168 hours CBC:   Recent Labs Lab 08/01/12 0705 08/02/12 0609  WBC 5.1 5.2  HGB 10.2* 10.5*  HCT 32.9* 32.8*  MCV 74.6* 73.7*  PLT 252 247   Coagulation:   Recent Labs Lab 08/02/12 0609  LABPROT 13.4  INR 1.03   Cardiac Enzymes:  No results found for this basename: CKTOTAL, CKMB, CKMBINDEX, TROPONINI,  in the last 168 hours Urinalysis:  No results found for this basename: COLORURINE, APPERANCEUR, LABSPEC, PHURINE, GLUCOSEU, HGBUR, BILIRUBINUR, KETONESUR, PROTEINUR, UROBILINOGEN, NITRITE, LEUKOCYTESUR,  in the last 168 hours Lipid Panel    Component Value Date/Time   CHOL 136 07/22/2012 0032   TRIG 93 07/22/2012 0032   HDL 27* 07/22/2012 0032   CHOLHDL 5.0 07/22/2012 0032   VLDL 19 07/22/2012 0032   LDLCALC 90 07/22/2012 0032   HgbA1C  Lab Results  Component Value Date   HGBA1C 7.3* 07/22/2012   Urine Drug Screen:   No results found for this basename: labopia,  cocainscrnur,  labbenz,  amphetmu,  thcu,  labbarb    Alcohol Level: No results found for this basename: ETH,  in the last 168 hours  CT of the brain  07/21/2012   1.  Large nonhemorrhagic left middle cerebral artery infarct with early edema without midline shift. 2.  Old left occipital infarct. 3.  Atrophy, most prominent in the temporal lobes.   MRI of the brain  07/22/2012  Motion degraded exam.  Large left middle cerebral artery distribution infarct as noted above.  Thrombus within the left middle cerebral artery suspected with occluded branches distal to this region.    MRA of the brain  1. Left MCA occluded or nearly occluded in the M1 segment just beyond its origin. This corresponds to the earlier MRI findings. Faint more distal left MCA flow signal; little to no M2 segment  flow signal. 2. Moderately degraded by motion despite repeated imaging  attempts.  2D Echocardiogram  EF 15% with no source of embolus.    Carotid Doppler  No evidence of hemodynamically significant internal carotid artery stenosis. Vertebral artery flow is antegrade.   TEE 07/26/2012 - ejection fraction 20-25%. There was a small mobile structure attached to the anterior wall of the LV. This may have been a loose trabeculation but cannot rule out thrombus given history."Would consider anticoagulation if she would be a safe candidate"  TCD  Normal mean flow velocities in right middle cerebral, basilar, right vertebral, bilateral carotid siphons and opthalmic arteries. Suboptimal windows throghout limits exam.   CXR   07/22/2012    1.  Persistent pulmonary edema. 2.  No change in aeration to the left lower lobe.  07/21/2012  1.  Cardiomegaly and pulmonary edema. 2.  Small left pleural effusion and basilar airspace disease which could be due to atelectasis or pneumonia.   EKG  normal sinus rhythm, LBBB.   Therapy Recommendations CIR, CIR recommends SNF  Physical Exam   General - 79 year old female lying in bed mute with left gaze preference. NG tube now back in place. Heart - Regular with ectopics Lungs - Clear to auscultation anteriorly. Abdomen - Soft - non tender Skin - Warm and dry Obese. 1 to 2 plus edema at the ankles Neurological Exam ;  :  The patient is lethargic, non-verbal. The patient has a pain response with passive movement of any extremity.Left gaze preference, and no blink to threat from the right.  Right hemiparesis, arm> leg, severe. Occasional spontaneous movements of the right foot . Will move the left side. The patient is mute, but will follow command to wiggle toes. Could not ambulate, and the patient will not follow commands for cerebellar testing. DTR are depressed, symmetric, right babinski.   ASSESSMENT Jenny Brady is a 79 y.o. demented female presenting with unresponsiveness and respiratory distress after being found down at home. Imaging confirms a left large MCA infarct. Infarct embolic  due to cardiac thrombus secondary to low EF. On aspirin 81 mg orally every day prior to admission. Now on IV heparin for secondary stroke prevention. Patient with resultant right hemiparesis, global aphasia, dysphagia. SNF recommended.     Severe dysphagia, on D1 pudding thick liquids, but overall intake poor. Also has tube feedings. Son agreed to PEG. PEG placed in radiology 08/02/2012, ok to use 08/03/2012  Obesity, Body mass index is 30.3 kg/(m^2).   Baseline dementia  Community acquired pneumonia, on abx  UTI, on abx  Cardiomyopathy with EF 15%  Cardiac thrombus per TEE  CAD  LDL 90 - not on any statin medication prior to admission.   HgbA1c 7.3  Obstructive sleep apnea, refuses c-pap  Son has refused palliative care. Wants aggressive care. Understands SNF placement will be required at time of discharge as son cannot provide care.  Hospital day # 12  TREATMENT/PLAN  Continue heparin for secondary stroke prevention. Recommend starting coumadin 08/03/2012 once okayed by radiology to use PEG Add statin 08/03/2012 once PEG in use SNF at discharge  [facility], MSN, RN, ANVP-BC, ANP-BC, [facility] Pager: [phone] 08/02/2012 11:36 AM  I have personally obtained a history, examined the patient, evaluated imaging results, and formulated the assessment and plan of care. I agree with the above.  Delia Heady, MD

## 2012-08-02 NOTE — Progress Notes (Signed)
,Patient ID: Jenny Brady  female  ZOX:[id number]    DOB: 06/30/1925    DOA: 07/21/2012  PCP: [name], MD  Interim Summary: 79 yo female w/hx pf CAD, dementia came in on 4/13 for resp distress & R facial droop/hemiparesis.  Pt's workup found to have large left MCA CVA secondary to possible intracardiac thrombus.  She also failed swallow eval initially, temporary feeding tube placed on 07/23/12. Patient was finally placed on dysphagia 1 diet with pudding thick liquids by speech therapy on 07/29/12.  Assessment/Plan:    Acute ischemic large left MCA stroke:  - CT head was obtained which showed large left MCA distribution infarct, most likely embolic. Not a candidate for thrombolytics due to unknown time of onset, delayed presentation. - heparin drip on hold for Gtube placement, can resume or Stroke service to decide anticoagulation via Gtube - MRI of the brain showed large left hemispheric middle cerebral artery distribution infarct involving left temporal lobe, left frontal lobe and left opercular/sub insular region, involvement of the left lenticular nucleus/left corona radiata with local mass effect. Thrombus within the left middle cerebral artery suspected with occluded branches distal to this region. -  2-D echo showed EF of 15%, severe tricuspid regurgitation, moderate PHTN, PAP 62, moderate MR -  carotid Dopplers showed no ICA stenosis, antegrade vertebral flow - TEE on 4/18 showed possible intracardiac thrombus, Dr Rito Ehrlich discussed with cardiology and  Dr Shirlee Latch feels that the patient does have intracardiac thrombus.   Dysphagia: From CVA - Currently on Jevity via temporary feeding tube. Even though patient was placed on dysphagia 1 diet, her oral intake has remained inconsistent and poor -G tube placed today, will start Tubefeeds via Gtube tomorrow     DEMENTIA-mod-severe: alert and awake, follows some commands.   Hyperthyroidism-s/p RAI admin: Now hypothyroidism, TSH  13.9 - On IV Synthroid 37.5 mcg daily.    Coronary artery disease with acute on chronic systolic CHF (EF was 25-30% in 2012) - 2-D echo shows EF of 15% , BP stable  CAP (community acquired pneumonia)- left lung Patient has completed 8 days of antibiotics.      UTI (lower urinary tract infection):  - Urine culture showed no growth  DVT Prophylaxis: SCD's  Code Status:  Full code  Disposition: Per patient's son, [name]  Goals of care  1) he wants her to be FULL CODE STATUS and continue aggressive care "to keep her alive and going" 2) GTube 3) skilled nursing placement   Subjective: G tube to be placed today, at the time of my encounter, patient alert and awake, responding to some verbal commands, no acute events overnight  Objective: Weight change: 1.23 kg (2 lb 11.4 oz) No intake or output data in the 24 hours ending 08/02/12 1129 Blood pressure 113/53, pulse 78, temperature 99.6 F (37.6 C), temperature source Axillary, resp. rate 15, height 5\' 6"  (1.676 m), weight 85.1 kg (187 lb 9.8 oz), SpO2 98.00%.  Physical Exam: General:  alert and awake, comprehending the conversation CVS: S1-S2 clear Chest: CTAB Abdomen: obese, soft, nontender Extremities: no c/c/e Neuro: right hemiparesis  Lab Results: Basic Metabolic Panel: No results found for this basename: NA, K, CL, CO2, GLUCOSE, BUN, CREATININE, CALCIUM, MG, PHOS,  in the last 168 hours CBC:  Recent Labs Lab 08/01/12 0705 08/02/12 0609  WBC 5.1 5.2  HGB 10.2* 10.5*  HCT 32.9* 32.8*  MCV 74.6* 73.7*  PLT 252 247   CBG:  Recent Labs Lab 08/01/12 0756  08/01/12 1221 08/01/12 1608 08/01/12 2112 08/02/12 0642  GLUCAP 136* 149* 137* 139* 132*     Studies/Results: Dg Chest 1 View  07/21/2012   IMPRESSION:  1.  Cardiomegaly and pulmonary edema. 2.  Small left pleural effusion and basilar airspace disease which could be due to atelectasis or pneumonia.   Original Report Authenticated By: Holley Dexter, M.D.    Dg Chest 2 View  07/22/2012   IMPRESSION:  1.  Persistent pulmonary edema. 2.  No change in aeration to the left lower lobe.   Original Report Authenticated By: Signa Kell, M.D.    Ct Head Wo Contrast  07/21/2012  *RADIOLOGY REPORT*  Clinical Data: Altered mental status.  The patient is unresponsive.  CT HEAD WITHOUT CONTRAST  Technique:  Contiguous axial images were obtained from the base of the skull through the vertex without contrast.  Comparison: 02/09/2008  Findings: The patient has an acute large non hemorrhagic left middle cerebral artery distribution infarct with slight edema.  No midline shift at this time. There is a hyper dense branch of the left middle cerebral artery seen on image number 11 of series 2, consistent with acute thrombosis.  There is an old left occipital infarct, new since 02/09/2008.  Diffuse cerebral cortical atrophy, most prominent in the temporal lobes.  No significant osseous abnormality.  IMPRESSION:  1.  Large nonhemorrhagic left middle cerebral artery infarct with early edema without midline shift. 2.  Old left occipital infarct. 3.  Atrophy, most prominent in the temporal lobes.   Original Report Authenticated By: Francene Boyers, M.D.    Mr Brain Wo Contrast  07/22/2012  *RADIOLOGY REPORT*  Clinical Data: Altered mental status.  Abnormal CT.  MRI HEAD WITHOUT CONTRAST  Technique:  Multiplanar, multiecho pulse sequences of the brain and surrounding structures were obtained according to standard protocol without intravenous contrast.  Comparison: 07/21/2012 CT.  03/08/2004 MR.  Findings: Motion degraded exam.  MR angiogram was ordered although the patient not able to cooperate for this exam.  Large left hemispheric middle cerebral artery distribution infarct involving left temporal lobe, left frontal lobe and left opercular/sub insular region.  Involvement of the left lenticular nucleus/left corona radiata.  No intracranial hemorrhage.  Local mass  effect without midline shift.  Remote left occipital lobe infarct with encephalomalacia.  Mild small vessel disease type changes.  Global atrophy without hydrocephalus.  No intracranial mass lesion detected on this unenhanced exam.  Limited evaluation of the intracranial vascular structures secondary to the motion.  Thrombus within the left middle cerebral artery suspected with occluded branches distal to this region.  IMPRESSION: Motion degraded exam.  Large left middle cerebral artery distribution infarct as noted above.  Thrombus within the left middle cerebral artery suspected with occluded branches distal to this region.  This has been made a PRA call report utilizing dashboard call feature.   Original Report Authenticated By: Lacy Duverney, M.D.     Medications: Scheduled Meds: . free water  150 mL Per Tube Q6H  . levothyroxine  37.5 mcg Intravenous QAC breakfast  . metoprolol  2.5 mg Intravenous Q12H  . pantoprazole (PROTONIX) IV  40 mg Intravenous QHS      LOS: 12 days   RAI,RIPUDEEP M.D.  [facility]  08/02/2012, 11:29 AM  Pager: [phone]   If 7PM-7AM, please contact night-coverage [email or web address] Password [email or web address]

## 2012-08-02 NOTE — Progress Notes (Signed)
Pt. Refuses CPAP at this time. There isn't a machine in the pt.'s room.

## 2012-08-02 NOTE — Progress Notes (Signed)
Pt continues to refuse CPAP at this time. He states that he is uncomfortable. He also has NG tube RT explained to patient to call if she changed mind.

## 2012-08-02 NOTE — Progress Notes (Signed)
ANTICOAGULATION CONSULT NOTE - Follow Up Consult   Pharmacy Consult :  Heparin [resumption s/p G-Tube placement] Indication: Large L-MCA thrombus due to intracardiac thrombus  Dosing Weight: 78 kg  Labs:  Recent Labs  07/31/12 0637 08/01/12 0705 08/01/12 1942 08/02/12 0609  HGB 10.3* 10.2*  --  10.5*  HCT 33.3* 32.9*  --  32.8*  PLT 248 252  --  247  INR  --   --   --  1.03  HEPARINUNFRC 0.45 0.25* 0.60 0.54   Estimated Creatinine Clearance: 57.5 ml/min (by C-G formula based on Cr of 0.79).  Medications:  Scheduled:  . [COMPLETED]  ceFAZolin (ANCEF) IV  2 g Intravenous On Call to OR  . free water  150 mL Per Tube Q6H  . levothyroxine  37.5 mcg Intravenous QAC breakfast  . metoprolol  2.5 mg Intravenous Q12H  . pantoprazole (PROTONIX) IV  40 mg Intravenous QHS   Infusions:  . [RESUME] Heparin 1,100 Units/hr (08/01/12 2055)    Assessment:  79 y/o female to resume Heparin for a Large L-MCA thrombus due to intracardiac thrombus following G-Tube Placement.  Heparin level was therapeutic this AM, prior to pre-procedure drip discontinuation No bleeding complications noted   Goal of Therapy:  Heparin level 0.3-0.7 units/ml   Plan:   Resume Heparin, no bolus, at 1100 units/hr Will check Heparin Level in 8 hours Daily Heparin level and CBC while on Heparin.  Monitor for bleeding complications   Laurena Bering, Pharm.D.  08/02/2012,12:01 PM

## 2012-08-02 NOTE — Procedures (Signed)
Successful fluoroscopic guided insertion of gastrostomy tube without immediate post procedural complicatoin.   The gastrostomy tube may be used immediately for medications.  Otherwise, place gastrostomy tube to low wall suction for 24 hrs.  Tube feeds may be initiated in 24 hours as per the primary team.   

## 2012-08-02 NOTE — Progress Notes (Signed)
Physical Therapy Treatment Patient Details Name: Jenny Brady MRN: [id number] DOB: [birth year] Today's Date: 08/02/2012 Time: 7829-5621 PT Time Calculation (min): 39 min  PT Assessment / Plan / Recommendation Comments on Treatment Session  Adm. s/p L MCA CVA. Slow improvement today with attention and participation. Positioned her on her affected side in the bed following our session with attempts to encourage awareness.     Follow Up Recommendations  SNF     Does the patient have the potential to tolerate intense rehabilitation     Barriers to Discharge        Equipment Recommendations  None recommended by PT    Recommendations for Other Services    Frequency Min 3X/week   Plan Discharge plan remains appropriate;Frequency remains appropriate    Precautions / Restrictions Precautions Precautions: Fall Precaution Comments: right neglect, vision deficits, wears L mitt, RUE 0/5 Restrictions Weight Bearing Restrictions: No   Pertinent Vitals/Pain Moaning with moblity, RN aware    Mobility  Bed Mobility Bed Mobility: Rolling Right;Rolling Left Rolling Right: 1: +2 Total assist Rolling Right: Patient Percentage: 10% Rolling Left: Patient Percentage: 0% Supine to Sit: 1: +2 Total assist Supine to Sit: Patient Percentage: 10% Sitting - Scoot to Edge of Bed: 1: +1 Total assist Sit to Supine: 1: +2 Total assist Sit to Supine: Patient Percentage: 20% Details for Bed Mobility Assistance: sequencing cues, able to reach and pull on rail when rolling to the right side; appeared to be trying to lower her trunk down when asked to lie down still needs significant assist Transfers Transfers: Lateral/Scoot Transfers Lateral/Scoot Transfers: 1: +2 Total assist Lateral Transfers: Patient Percentage: 0% Details for Transfer Assistance: attempted sit<>stand x1 however pt not initiating, needs +2totalpt0% to scoot to her left, moaning, difficulty understanding what we are asking her     Exercises General Exercises - Lower Extremity Ankle Circles/Pumps: AAROM;Both;10 reps;Supine Heel Slides: AAROM;Both;5 reps;Supine    PT Goals Acute Rehab PT Goals PT Goal: Rolling Supine to Right Side - Progress: Not progressing PT Goal: Rolling Supine to Left Side - Progress: Not progressing PT Goal: Supine/Side to Sit - Progress: Not progressing PT Goal: Sit at Edge Of Bed - Progress: Progressing toward goal PT Goal: Sit to Supine/Side - Progress: Progressing toward goal PT Goal: Sit to Stand - Progress: Not progressing PT Goal: Stand to Sit - Progress: Not progressing PT Transfer Goal: Bed to Chair/Chair to Bed - Progress: Progressing toward goal PT Goal: Stand - Progress: Not progressing Additional Goals PT Goal: Additional Goal #1 - Progress: Progressing toward goal  Visit Information  Last PT Received On: 08/02/12 Assistance Needed: +2    Subjective Data  Subjective: moaning, does respond to her name, nods her head to most questions   Cognition  Cognition Behavior During Therapy: Flat affect Overall Cognitive Status: Difficult to assess (aphasic) Area of Impairment: Attention;Following commands;Awareness Following Commands: Follows one step commands inconsistently (about 10% of the time) Awareness: Intellectual;Emergent;Anticipatory General Comments: better ability to console her and distract her today however still moaning in what I assume to be fear when we mobilize her as she appears to be gaurding/pushing away  Difficult to assess due to: Impaired communication    Balance  Dynamic Sitting Balance Dynamic Sitting - Balance Support: No upper extremity supported Dynamic Sitting - Level of Assistance: 4: Min assist Dynamic Sitting - Balance Activities: Forward lean/weight shifting;Reaching across midline;Lateral lean/weight shifting Dynamic Sitting - Comments: trunk facilitation as well as engagement of less affected LUE we encouraged  AP and lateral trunk rocking in  attempts to engage lower extremities on the floor and pts awareness of space; also engaged pt in trunk rotation to the right with max v/c's and tactile facilitation  End of Session PT - End of Session Equipment Utilized During Treatment: Gait belt;Oxygen Activity Tolerance: Patient limited by fatigue Patient left: in bed;with call bell/phone within reach;with bed alarm set Nurse Communication: Mobility status   GP     [facility] HELEN 08/02/2012, 9:32 AM

## 2012-08-02 NOTE — ED Notes (Signed)
On O2 2L/[place] from floor

## 2012-08-02 NOTE — Progress Notes (Signed)
ANTICOAGULATION CONSULT NOTE - Follow Up Consult  Pharmacy Consult for heparin Indication: L-MCA thrombus  Labs:  Recent Labs  07/31/12 0637 08/01/12 0705 08/01/12 1942 08/02/12 0609 08/02/12 2155  HGB 10.3* 10.2*  --  10.5*  --   HCT 33.3* 32.9*  --  32.8*  --   PLT 248 252  --  247  --   APTT  --   --   --  88*  --   LABPROT  --   --   --  13.4  --   INR  --   --   --  1.03  --   HEPARINUNFRC 0.45 0.25* 0.60 0.54 0.30    Assessment/Plan:  79yo female therapeutic though on low end of goal after resuming heparin, had previously had higher levels at this rate, would expect level to continue to rise.  Will continue gtt at current rate and continue to monitor levels.  Vernard Gambles, PharmD, BCPS  08/02/2012,10:56 PM

## 2012-08-03 LAB — GLUCOSE, CAPILLARY
Glucose-Capillary: 113 mg/dL — ABNORMAL HIGH (ref 70–99)
Glucose-Capillary: 111 mg/dL — ABNORMAL HIGH (ref 70–99)

## 2012-08-03 LAB — CBC
HCT: 32.9 % — ABNORMAL LOW (ref 36.0–46.0)
Hemoglobin: 10.4 g/dL — ABNORMAL LOW (ref 12.0–15.0)
MCV: 74.1 fL — ABNORMAL LOW (ref 78.0–100.0)
RBC: 4.44 MIL/uL (ref 3.87–5.11)
WBC: 6.4 10*3/uL (ref 4.0–10.5)

## 2012-08-03 MED ORDER — LEVOTHYROXINE SODIUM 75 MCG PO TABS
75.0000 ug | ORAL_TABLET | Freq: Every day | ORAL | Status: DC
  Administered 2012-08-04 – 2012-08-13 (×10): 75 ug via ORAL
  Filled 2012-08-03 (×12): qty 1

## 2012-08-03 MED ORDER — SIMVASTATIN 20 MG PO TABS
20.0000 mg | ORAL_TABLET | Freq: Every day | ORAL | Status: DC
  Administered 2012-08-03 – 2012-08-13 (×11): 20 mg via ORAL
  Filled 2012-08-03 (×12): qty 1

## 2012-08-03 MED ORDER — WARFARIN SODIUM 4 MG PO TABS
4.0000 mg | ORAL_TABLET | Freq: Once | ORAL | Status: AC
  Administered 2012-08-03: 4 mg via ORAL
  Filled 2012-08-03: qty 1

## 2012-08-03 MED ORDER — WARFARIN - PHARMACIST DOSING INPATIENT
Freq: Every day | Status: DC
  Administered 2012-08-03: 18:00:00

## 2012-08-03 MED ORDER — COUMADIN BOOK
Freq: Once | Status: AC
  Administered 2012-08-03: 13:00:00
  Filled 2012-08-03: qty 1

## 2012-08-03 MED ORDER — WARFARIN VIDEO
Freq: Once | Status: AC
  Administered 2012-08-03: 13:00:00

## 2012-08-03 NOTE — Progress Notes (Signed)
Stroke Team Progress Note  HISTORY Jenny Brady is an 79 y.o. female with a past medical history significant for dementia, CAD, CHF, hyperthyroidism, transferred from [facility] for further management of a large left MCA distribution infarct. Patient is not able to provide information and thus all clinical data is gathered from patient's medical record. She appaently lives by herself and on 07/21/2012 she was found by her son unresponsive with some respiratory distress and was taken to [facility] where she was found to be poorly responsive and unable to move the right side. An emergent CT brain revealed a large area of decreased attenuation in the left MCA distribution suggestive of an ischemic infarct. Transferred to [facility] for further management. Patient was not a TPA candidate secondary to unknown time of symptom onset.  SUBJECTIVE   OBJECTIVE Most recent Vital Signs: Filed Vitals:   08/02/12 2208 08/03/12 0259 08/03/12 0641 08/03/12 1000  BP: 132/67 118/59 98/48 113/56  Pulse: 82 80 74 75  Temp: 99.3 F (37.4 C) 99.5 F (37.5 C) 99.3 F (37.4 C) 98.5 F (36.9 C)  TempSrc: Axillary Oral Oral Oral  Resp: 20 20 20 18   Height:      Weight:      SpO2: 99% 98% 97% 98%   CBG (last 3)   Recent Labs  08/02/12 1627 08/02/12 2210 08/03/12 0737  GLUCAP 122* 128* 125*   IV Fluid Intake:   . dextrose 5 % and 0.9% NaCl 50 mL/hr at 08/01/12 1949  . feeding supplement (JEVITY 1.2 CAL) 1,000 mL (07/31/12 2218)  . heparin 1,100 Units/hr (08/02/12 2248)   MEDICATIONS  . free water  150 mL Per Tube Q6H  . levothyroxine  37.5 mcg Intravenous QAC breakfast  . metoprolol  2.5 mg Intravenous Q12H  . pantoprazole (PROTONIX) IV  40 mg Intravenous QHS   PRN:  acetaminophen, acetaminophen, albuterol, ondansetron (ZOFRAN) IV, sodium chloride  Diet:  NPO  Activity:  OOB with assistance DVT Prophylaxis:  SCDs   CLINICALLY SIGNIFICANT STUDIES Basic Metabolic Panel:  No results found for this basename: NA,  K, CL, CO2, GLUCOSE, BUN, CREATININE, CALCIUM, MG, PHOS,  in the last 168 hours Liver Function Tests:  No results found for this basename: AST, ALT, ALKPHOS, BILITOT, PROT, ALBUMIN,  in the last 168 hours CBC:   Recent Labs Lab 08/02/12 0609 08/03/12 0615  WBC 5.2 6.4  HGB 10.5* 10.4*  HCT 32.8* 32.9*  MCV 73.7* 74.1*  PLT 247 196   Coagulation:   Recent Labs Lab 08/02/12 0609  LABPROT 13.4  INR 1.03   Cardiac Enzymes:  No results found for this basename: CKTOTAL, CKMB, CKMBINDEX, TROPONINI,  in the last 168 hours Urinalysis:  No results found for this basename: COLORURINE, APPERANCEUR, LABSPEC, PHURINE, GLUCOSEU, HGBUR, BILIRUBINUR, KETONESUR, PROTEINUR, UROBILINOGEN, NITRITE, LEUKOCYTESUR,  in the last 168 hours Lipid Panel    Component Value Date/Time   CHOL 136 07/22/2012 0032   TRIG 93 07/22/2012 0032   HDL 27* 07/22/2012 0032   CHOLHDL 5.0 07/22/2012 0032   VLDL 19 07/22/2012 0032   LDLCALC 90 07/22/2012 0032   HgbA1C  Lab Results  Component Value Date   HGBA1C 7.3* 07/22/2012   Urine Drug Screen:   No results found for this basename: labopia,  cocainscrnur,  labbenz,  amphetmu,  thcu,  labbarb    Alcohol Level: No results found for this basename: ETH,  in the last 168 hours  CT of the brain  07/21/2012   1.  Large  nonhemorrhagic left middle cerebral artery infarct with early edema without midline shift. 2.  Old left occipital infarct. 3.  Atrophy, most prominent in the temporal lobes.   MRI of the brain  07/22/2012  Motion degraded exam.  Large left middle cerebral artery distribution infarct as noted above.  Thrombus within the left middle cerebral artery suspected with occluded branches distal to this region.    MRA of the brain  1. Left MCA occluded or nearly occluded in the M1 segment just beyond its origin. This corresponds to the earlier MRI findings. Faint more distal left MCA flow signal; little to no M2 segment  flow signal. 2. Moderately degraded by motion  despite repeated imaging  attempts.  2D Echocardiogram  EF 15% with no source of embolus.   Carotid Doppler  No evidence of hemodynamically significant internal carotid artery stenosis. Vertebral artery flow is antegrade.   TEE 07/26/2012 - ejection fraction 20-25%. There was a small mobile structure attached to the anterior wall of the LV. This may have been a loose trabeculation but cannot rule out thrombus given history."Would consider anticoagulation if she would be a safe candidate"  TCD  Normal mean flow velocities in right middle cerebral, basilar, right vertebral, bilateral carotid siphons and opthalmic arteries. Suboptimal windows throghout limits exam.   CXR   07/22/2012    1.  Persistent pulmonary edema. 2.  No change in aeration to the left lower lobe.  07/21/2012  1.  Cardiomegaly and pulmonary edema. 2.  Small left pleural effusion and basilar airspace disease which could be due to atelectasis or pneumonia.   EKG  normal sinus rhythm, LBBB.   Therapy Recommendations CIR, CIR recommends SNF  Physical Exam   General - 79 year old female lying in bed mute with left gaze preference. NG tube now back in place. Heart - Regular with ectopics Lungs - Clear to auscultation anteriorly. Abdomen - Soft - non tender - gastrostomy tube in place with clean dressing. Skin - Warm and dry Obese. 1 to 2 plus edema at the ankles Neurological Exam ;  :  The patient is lethargic, non-verbal. The patient has a pain response with passive movement of any extremity.Left gaze preference, and no blink to threat from the right.  Right hemiparesis, arm> leg, severe. Occasional spontaneous movements of the right foot . Will move the left side. The patient is mute, but will follow command to wiggle toes. Could not ambulate, and the patient will not follow commands for cerebellar testing. DTR are depressed, symmetric, right babinski.   ASSESSMENT Jenny Brady is a 79 y.o. demented female  presenting with unresponsiveness and respiratory distress after being found down at home. Imaging confirms a left large MCA infarct. Infarct embolic due to cardiac thrombus secondary to low EF. On aspirin 81 mg orally every day prior to admission. Now on IV heparin for secondary stroke prevention. Patient with resultant right hemiparesis, global aphasia, dysphagia. SNF recommended.     Severe dysphagia, on D1 pudding thick liquids, but overall intake poor. Also has tube feedings. Son agreed to PEG. PEG placed in radiology 08/02/2012, ok to use 08/03/2012  Obesity, Body mass index is 30.3 kg/(m^2).   Baseline dementia  Community acquired pneumonia, on abx  UTI, on abx  Cardiomyopathy with EF 15%  Cardiac thrombus per TEE  CAD  LDL 90 - not on any statin medication prior to admission.   HgbA1c 7.3  Obstructive sleep apnea, refuses c-pap  Son has refused palliative care. Wants  aggressive care. Understands SNF placement will be required at time of discharge as son cannot provide care.  Hospital day # 13  TREATMENT/PLAN  On IV heparin per pharmacy for secondary stroke prevention. Cleared to use gastrostomy tube. Will start Coumadin. Add statin. SNF at discharge Stroke team will sign off. D/W Dr Josephine Igo PA-C [facility] Pager [phone] 08/03/2012, 11:17 AM  I have personally obtained a history, examined the patient, evaluated imaging results, and formulated the assessment and plan of care. I agree with the above.  Delia Heady, MD

## 2012-08-03 NOTE — Progress Notes (Addendum)
Patient ID: Jenny Brady, female   DOB: 06-09-1925, 79 y.o.   MRN: [id number]   G tube placed 4/25  +BS; NT 99.3 Site clean and dry  May use now Off suction

## 2012-08-03 NOTE — Progress Notes (Signed)
ANTICOAGULATION CONSULT NOTE - Follow Up Consult  Pharmacy Consult for Heparin >> Coumadin (start 4/26) Indication: stroke and ? LV thrombus on TEE  Allergies  Allergen Reactions  . Hyoscyamine   . Ivp Dye (Iodinated Diagnostic Agents)   . Septra (Sulfamethoxazole W-Trimethoprim)     Patient Measurements: Height: 5\' 6"  (167.6 cm) Weight: 187 lb 9.8 oz (85.1 kg) IBW/kg (Calculated) : 59.3 Heparin Dosing Weight: 77 Kg  Vital Signs: Temp: 98.5 F (36.9 C) (04/26 1000) Temp src: Oral (04/26 1000) BP: 113/56 mmHg (04/26 1000) Pulse Rate: 75 (04/26 1000)  Labs:  Recent Labs  08/01/12 0705  08/02/12 0609 08/02/12 2155 08/03/12 0615  HGB 10.2*  --  10.5*  --  10.4*  HCT 32.9*  --  32.8*  --  32.9*  PLT 252  --  247  --  196  APTT  --   --  88*  --   --   LABPROT  --   --  13.4  --   --   INR  --   --  1.03  --   --   HEPARINUNFRC 0.25*  < > 0.54 0.30 0.32  < > = values in this interval not displayed.  Estimated Creatinine Clearance: 57.5 ml/min (by C-G formula based on Cr of 0.79).  Assessment: Patient continues on heparin for L-MCA stroke d/t intracardiac thrombus. Heparin level is therapeutic, H/H and plts are stable. INR nml at baseline. No bleeding reported. Noted it's now ok to use patien't GT. No bleeding reported.  Goal of Therapy:  INR 2-3 Heparin level 0.3-0.5 units/ml Monitor platelets by anticoagulation protocol: Yes   Plan:  - Continue heparin infusion at 1100 units/hr - Will f/up daily CBC and heparin level - Coumadin 4mg  PO x 1 today - Will check daily INR - Coumadin book + video  Thanks, Talesha Ellithorpe K. Allena Katz, PharmD, BCPS.  Clinical Pharmacist Pager [phone]. 08/03/2012 12:50 PM

## 2012-08-03 NOTE — Progress Notes (Signed)
,Patient ID: Jenny Brady  female  WUJ:[id number]    DOB: [birth year]    DOA: 07/21/2012  PCP: Jenny Peck, MD  Interim Summary: 79 yo female w/hx pf CAD, dementia came in on 4/13 for resp distress & R facial droop/hemiparesis.  Pt's workup found to have large left MCA CVA secondary to possible intracardiac thrombus.  She also failed swallow eval initially, temporary feeding tube placed on 07/23/12. Patient was finally placed on dysphagia 1 diet with pudding thick liquids by speech therapy on 07/29/12.  Assessment/Plan:    Acute ischemic large left MCA stroke:  - CT head was obtained which showed large left MCA distribution infarct, most likely embolic. Not a candidate for thrombolytics due to unknown time of onset, delayed presentation. - Start Coumadin tonight per stroke service conditions for intracardiac thrombus - MRI of the brain showed large left hemispheric middle cerebral artery distribution infarct involving left temporal lobe, left frontal lobe and left opercular/sub insular region, involvement of the left lenticular nucleus/left corona radiata with local mass effect. Thrombus within the left middle cerebral artery suspected with occluded branches distal to this region. -  2-D echo showed EF of 15%, severe tricuspid regurgitation, moderate PHTN, PAP 62, moderate MR -  carotid Dopplers showed no ICA stenosis, antegrade vertebral flow - TEE on 4/18 showed possible intracardiac thrombus, Dr Jenny Brady discussed with cardiology and  Dr Jenny Brady feels that the patient does have intracardiac thrombus.   Dysphagia: From CVA -G tube placed yesterday, will start tube feeds today  - Continue dysphagia 1 diet, serial swallow evaluations      DEMENTIA-mod-severe: alert and awake, follows some commands.   Hyperthyroidism-s/p Jenny Brady admin: Now hypothyroidism, TSH 13.9 - Start oral replacement    Coronary artery disease with acute on chronic systolic CHF (EF was 25-30% in 2012) - 2-D echo  shows EF of 15% , BP stable  CAP (community acquired pneumonia)- left lung Patient has completed 8 days of antibiotics.      UTI (lower urinary tract infection):  - Urine culture showed no growth  DVT Prophylaxis: SCD's  Code Status:  Full code  Disposition: Per patient's son, Jenny Brady  Goals of care  1) he wants her to be FULL CODE STATUS and continue aggressive care "to keep her alive and going" 2) GTube 3) skilled nursing placement   Subjective: G tube  placed yesterday, no specific complaints today, patient is alert and awake   Objective: Weight change:   Intake/Output Summary (Last 24 hours) at 08/03/12 1420 Last data filed at 08/03/12 0647  Gross per 24 hour  Intake 447.34 ml  Output   1025 ml  Net -577.66 ml   Blood pressure 113/56, pulse 75, temperature 98.5 F (36.9 C), temperature source Oral, resp. rate 18, height 5\' 6"  (1.676 m), weight 85.1 kg (187 lb 9.8 oz), SpO2 98.00%.  Physical Exam: General:  alert and awake CVS: S1-S2 clear Chest: CTAB Abdomen: obese, soft, nontender Extremities: no c/c/e Neuro: right hemiparesis  Lab Results: Basic Metabolic Panel: No results found for this basename: NA, K, CL, CO2, GLUCOSE, BUN, CREATININE, CALCIUM, MG, PHOS,  in the last 168 hours CBC:  Recent Labs Lab 08/02/12 0609 08/03/12 0615  WBC 5.2 6.4  HGB 10.5* 10.4*  HCT 32.8* 32.9*  MCV 73.7* 74.1*  PLT 247 196   CBG:  Recent Labs Lab 08/02/12 1138 08/02/12 1627 08/02/12 2210 08/03/12 0737 08/03/12 1150  GLUCAP 147* 122* 128* 125* 113*     Studies/Results:  Dg Chest 1 View  07/21/2012   IMPRESSION:  1.  Cardiomegaly and pulmonary edema. 2.  Small left pleural effusion and basilar airspace disease which could be due to atelectasis or pneumonia.   Original Report Authenticated By: Jenny Brady, M.D.    Dg Chest 2 View  07/22/2012   IMPRESSION:  1.  Persistent pulmonary edema. 2.  No change in aeration to the left lower lobe.    Original Report Authenticated By: Jenny Brady, M.D.    Ct Head Wo Contrast  07/21/2012  *RADIOLOGY REPORT*  Clinical Data: Altered mental status.  The patient is unresponsive.  CT HEAD WITHOUT CONTRAST  Technique:  Contiguous axial images were obtained from the base of the skull through the vertex without contrast.  Comparison: 02/09/2008  Findings: The patient has an acute large non hemorrhagic left middle cerebral artery distribution infarct with slight edema.  No midline shift at this time. There is a hyper dense branch of the left middle cerebral artery seen on image number 11 of series 2, consistent with acute thrombosis.  There is an old left occipital infarct, new since 02/09/2008.  Diffuse cerebral cortical atrophy, most prominent in the temporal lobes.  No significant osseous abnormality.  IMPRESSION:  1.  Large nonhemorrhagic left middle cerebral artery infarct with early edema without midline shift. 2.  Old left occipital infarct. 3.  Atrophy, most prominent in the temporal lobes.   Original Report Authenticated By: Jenny Brady, M.D.    Mr Brain Wo Contrast  07/22/2012  *RADIOLOGY REPORT*  Clinical Data: Altered mental status.  Abnormal CT.  MRI HEAD WITHOUT CONTRAST  Technique:  Multiplanar, multiecho pulse sequences of the brain and surrounding structures were obtained according to standard protocol without intravenous contrast.  Comparison: 07/21/2012 CT.  03/08/2004 MR.  Findings: Motion degraded exam.  MR angiogram was ordered although the patient not able to cooperate for this exam.  Large left hemispheric middle cerebral artery distribution infarct involving left temporal lobe, left frontal lobe and left opercular/sub insular region.  Involvement of the left lenticular nucleus/left corona radiata.  No intracranial hemorrhage.  Local mass effect without midline shift.  Remote left occipital lobe infarct with encephalomalacia.  Mild small vessel disease type changes.  Global atrophy without  hydrocephalus.  No intracranial mass lesion detected on this unenhanced exam.  Limited evaluation of the intracranial vascular structures secondary to the motion.  Thrombus within the left middle cerebral artery suspected with occluded branches distal to this region.  IMPRESSION: Motion degraded exam.  Large left middle cerebral artery distribution infarct as noted above.  Thrombus within the left middle cerebral artery suspected with occluded branches distal to this region.  This has been made a PRA call report utilizing dashboard call feature.   Original Report Authenticated By: Lacy Duverney, M.D.     Medications: Scheduled Meds: . coumadin book   Does not apply Once  . free water  150 mL Per Tube Q6H  . levothyroxine  37.5 mcg Intravenous QAC breakfast  . metoprolol  2.5 mg Intravenous Q12H  . pantoprazole (PROTONIX) IV  40 mg Intravenous QHS  . simvastatin  20 mg Oral q1800  . warfarin  4 mg Oral ONCE-1800  . warfarin   Does not apply Once  . Warfarin - Pharmacist Dosing Inpatient   Does not apply q1800      LOS: 13 days   Nyesha Cliff M.D.  [facility]  08/03/2012, 2:20 PM  Pager: [phone]   If 7PM-7AM, please contact night-coverage  [email or web address] Password [email or web address]

## 2012-08-03 NOTE — Progress Notes (Signed)
CSW spoke with the Pt's son Elnita Surprenant [phone]) concerning d/c planning and bed choice.   Pt's son would like to wait to see if [facility] responds to request for SNF Placement. Pt's son stated that his father was a [facility] and he would really like for her to be at that location.   [facility] has yet to respond to bed request, however CSW encouraged son to call facility to assist with placement planning and d/c.   Weekday CSW to f/u with option and placement.   Leron Croak, LCSWA [facility].  [phone]

## 2012-08-04 LAB — CBC
Hemoglobin: 10.7 g/dL — ABNORMAL LOW (ref 12.0–15.0)
Platelets: 211 10*3/uL (ref 150–400)
RBC: 4.52 MIL/uL (ref 3.87–5.11)

## 2012-08-04 LAB — PROTIME-INR: Prothrombin Time: 14.2 seconds (ref 11.6–15.2)

## 2012-08-04 LAB — HEPARIN LEVEL (UNFRACTIONATED): Heparin Unfractionated: 0.36 IU/mL (ref 0.30–0.70)

## 2012-08-04 LAB — BASIC METABOLIC PANEL
BUN: 15 mg/dL (ref 6–23)
Chloride: 101 mEq/L (ref 96–112)
Creatinine, Ser: 0.58 mg/dL (ref 0.50–1.10)
GFR calc Af Amer: >90 mL/min (ref >90)
GFR calc non Af Amer: 82 mL/min — ABNORMAL LOW (ref >90)
Glucose, Bld: 142 mg/dL — ABNORMAL HIGH (ref 70–99)

## 2012-08-04 LAB — GLUCOSE, CAPILLARY: Glucose-Capillary: 135 mg/dL — ABNORMAL HIGH (ref 70–99)

## 2012-08-04 MED ORDER — FUROSEMIDE 10 MG/ML IJ SOLN
20.0000 mg | Freq: Once | INTRAMUSCULAR | Status: AC
  Administered 2012-08-04: 20 mg via INTRAVENOUS
  Filled 2012-08-04: qty 2

## 2012-08-04 MED ORDER — WARFARIN SODIUM 4 MG PO TABS
4.0000 mg | ORAL_TABLET | Freq: Once | ORAL | Status: AC
  Administered 2012-08-04: 4 mg
  Filled 2012-08-04: qty 1

## 2012-08-04 MED ORDER — FREE WATER
150.0000 mL | Freq: Three times a day (TID) | Status: DC
  Administered 2012-08-04 – 2012-08-13 (×28): 150 mL

## 2012-08-04 NOTE — Progress Notes (Signed)
Patient's Jevity now running at goal of 60cc/hr  Minor, [name]

## 2012-08-04 NOTE — Progress Notes (Signed)
Pt is refusing to wear CPAP 

## 2012-08-04 NOTE — Progress Notes (Signed)
,Patient ID: Jenny Brady  female  [id number]:[id number]    DOB: 16-Feb-1926    DOA: 07/21/2012  PCP: [name], MD  Interim Summary: 79 yo female w/hx pf CAD, dementia came in on 4/13 for resp distress & R facial droop/hemiparesis.  Pt's workup found to have large left MCA CVA secondary to possible intracardiac thrombus.  She also failed swallow eval initially, temporary feeding tube placed on 07/23/12. Patient was finally placed on dysphagia 1 diet with pudding thick liquids by speech therapy on 07/29/12.  Assessment/Plan:    Acute ischemic large left MCA stroke:  - CT head showed large left MCA distribution infarct, most likely embolic. Not a candidate for thrombolytics due to unknown time of onset, delayed presentation. - Started Coumadin per stroke service conditions for intracardiac thrombus, continue heparin until discharge - MRI of the brain showed large left hemispheric middle cerebral artery distribution infarct involving left temporal lobe, left frontal lobe and left opercular/sub insular region, involvement of the left lenticular nucleus/left corona radiata with local mass effect. Thrombus within the left middle cerebral artery suspected with occluded branches distal to this region. -  2-D echo showed EF of 15%, severe tricuspid regurgitation, moderate PHTN, PAP 62, moderate MR -  carotid Dopplers showed no ICA stenosis, antegrade vertebral flow - TEE on 4/18 showed possible intracardiac thrombus, Dr Rito Ehrlich discussed with cardiology and  Dr Shirlee Latch feels that the patient does have intracardiac thrombus.   Dysphagia: From CVA - tube feed started, so far tolerating via G tube - Continue dysphagia 1 diet, serial swallow evaluations      DEMENTIA-mod-severe: alert and awake, follows some commands.   Hyperthyroidism-s/p Alnita Aybar admin: Now hypothyroidism, TSH 13.9 - Start oral replacement    Coronary artery disease with acute on chronic systolic CHF (EF was 25-30% in 2012) - 2-D  echo shows EF of 15% , BP stable  CAP (community acquired pneumonia)- left lung Patient has completed 8 days of antibiotics.      UTI (lower urinary tract infection):  - Urine culture showed no growth  DVT Prophylaxis: SCD's  Code Status:  Full code  Disposition: Per patient's son, Gaige Fussner  Goals of care  1) he wants her to be FULL CODE STATUS and continue aggressive care "to keep her alive and going" 2) GTube 3) skilled nursing placement   Subjective: G tube Placed, started on tube feeds, alert and awake, unable to provide review system, no acute events overnight   Objective: Weight change:   Intake/Output Summary (Last 24 hours) at 08/04/12 1155 Last data filed at 08/04/12 0925  Gross per 24 hour  Intake    918 ml  Output   1600 ml  Net   -682 ml   Blood pressure 119/54, pulse 70, temperature 98.6 F (37 C), temperature source Axillary, resp. rate 17, height 5\' 6"  (1.676 m), weight 85.5 kg (188 lb 7.9 oz), SpO2 99.00%.  Physical Exam: General:  alert and awake CVS: S1-S2 clear Chest: CTAB Abdomen: obese, soft, nontender, G-tube+ Extremities: no c/c/e Neuro: right hemiparesis  Lab Results: Basic Metabolic Panel:  Recent Labs Lab 08/04/12 0859  NA 137  K 4.1  CL 101  CO2 29  GLUCOSE 142*  BUN 15  CREATININE 0.58  CALCIUM 8.8   CBC:  Recent Labs Lab 08/03/12 0615 08/04/12 0625  WBC 6.4 5.7  HGB 10.4* 10.7*  HCT 32.9* 33.2*  MCV 74.1* 73.5*  PLT 196 211   CBG:  Recent Labs Lab 08/03/12  1150 08/03/12 1633 08/03/12 2111 08/04/12 0653 08/04/12 1134  GLUCAP 113* 111* 135* 152* 152*     Studies/Results: Dg Chest 1 View  07/21/2012   IMPRESSION:  1.  Cardiomegaly and pulmonary edema. 2.  Small left pleural effusion and basilar airspace disease which could be due to atelectasis or pneumonia.   Original Report Authenticated By: Holley Dexter, M.D.    Dg Chest 2 View  07/22/2012   IMPRESSION:  1.  Persistent pulmonary edema. 2.   No change in aeration to the left lower lobe.   Original Report Authenticated By: Signa Kell, M.D.    Ct Head Wo Contrast  07/21/2012  *RADIOLOGY REPORT*  Clinical Data: Altered mental status.  The patient is unresponsive.  CT HEAD WITHOUT CONTRAST  Technique:  Contiguous axial images were obtained from the base of the skull through the vertex without contrast.  Comparison: 02/09/2008  Findings: The patient has an acute large non hemorrhagic left middle cerebral artery distribution infarct with slight edema.  No midline shift at this time. There is a hyper dense branch of the left middle cerebral artery seen on image number 11 of series 2, consistent with acute thrombosis.  There is an old left occipital infarct, new since 02/09/2008.  Diffuse cerebral cortical atrophy, most prominent in the temporal lobes.  No significant osseous abnormality.  IMPRESSION:  1.  Large nonhemorrhagic left middle cerebral artery infarct with early edema without midline shift. 2.  Old left occipital infarct. 3.  Atrophy, most prominent in the temporal lobes.   Original Report Authenticated By: Francene Boyers, M.D.    Mr Brain Wo Contrast  07/22/2012  *RADIOLOGY REPORT*  Clinical Data: Altered mental status.  Abnormal CT.  MRI HEAD WITHOUT CONTRAST  Technique:  Multiplanar, multiecho pulse sequences of the brain and surrounding structures were obtained according to standard protocol without intravenous contrast.  Comparison: 07/21/2012 CT.  03/08/2004 MR.  Findings: Motion degraded exam.  MR angiogram was ordered although the patient not able to cooperate for this exam.  Large left hemispheric middle cerebral artery distribution infarct involving left temporal lobe, left frontal lobe and left opercular/sub insular region.  Involvement of the left lenticular nucleus/left corona radiata.  No intracranial hemorrhage.  Local mass effect without midline shift.  Remote left occipital lobe infarct with encephalomalacia.  Mild small  vessel disease type changes.  Global atrophy without hydrocephalus.  No intracranial mass lesion detected on this unenhanced exam.  Limited evaluation of the intracranial vascular structures secondary to the motion.  Thrombus within the left middle cerebral artery suspected with occluded branches distal to this region.  IMPRESSION: Motion degraded exam.  Large left middle cerebral artery distribution infarct as noted above.  Thrombus within the left middle cerebral artery suspected with occluded branches distal to this region.  This has been made a PRA call report utilizing dashboard call feature.   Original Report Authenticated By: Lacy Duverney, M.D.     Medications: Scheduled Meds: . free water  150 mL Per Tube Q8H  . levothyroxine  75 mcg Oral QAC breakfast  . metoprolol  2.5 mg Intravenous Q12H  . pantoprazole (PROTONIX) IV  40 mg Intravenous QHS  . simvastatin  20 mg Oral q1800  . warfarin  4 mg Per Tube ONCE-1800  . Warfarin - Pharmacist Dosing Inpatient   Does not apply q1800      LOS: 14 days   Kade Rickels M.D.  [facility]  08/04/2012, 11:55 AM  Pager: [phone]   If 7PM-7AM,  please contact night-coverage [email or web address] Password [email or web address]

## 2012-08-04 NOTE — Progress Notes (Signed)
(  see previous note by MD Rai) Charge RN was able to go in and explain some answers to patient's son who had become quite agitated. He dramatically calmed down per charge RN and even hugged me after he spoke to charge RN. I asked him if his questions were answered and he said yes. I asked him would he still consider a discharge for his mom tomorrow if she remained medically stable, and he said no. Then said, "well maybe, if they check on her tomorrow.Jenny Brady   i just don't know." Patient then began talking about the roses on his mom's desk.   Minor, [name]

## 2012-08-04 NOTE — Progress Notes (Signed)
Pt has been and continues to refuse CPAP.

## 2012-08-04 NOTE — Progress Notes (Signed)
ANTICOAGULATION CONSULT NOTE - Follow Up Consult  Pharmacy Consult for Heparin >> Coumadin (start 4/26) Indication: stroke and ? LV thrombus on TEE  Allergies  Allergen Reactions  . Hyoscyamine   . Ivp Dye (Iodinated Diagnostic Agents)   . Septra (Sulfamethoxazole W-Trimethoprim)     Patient Measurements: Height: 5\' 6"  (167.6 cm) Weight: 188 lb 7.9 oz (85.5 kg) IBW/kg (Calculated) : 59.3 Heparin Dosing Weight: 77 Kg  Vital Signs: Temp: 97.4 F (36.3 C) (04/27 0621) Temp src: Axillary (04/27 0621) BP: 106/62 mmHg (04/27 0621) Pulse Rate: 83 (04/27 0621)  Labs:  Recent Labs  08/02/12 0609 08/02/12 2155 08/03/12 0615 08/04/12 0625  HGB 10.5*  --  10.4* 10.7*  HCT 32.8*  --  32.9* 33.2*  PLT 247  --  196 211  APTT 88*  --   --   --   LABPROT 13.4  --   --  14.2  INR 1.03  --   --  1.11  HEPARINUNFRC 0.54 0.30 0.32 0.36    Estimated Creatinine Clearance: 57.7 ml/min (by C-G formula based on Cr of 0.79).  Assessment: Patient continues on heparin for L-MCA stroke d/t intracardiac thrombus. Heparin level is therapeutic, H/H and plts are stable. INR subtherapeutic, as expected following first Coumadin dose last PM. No bleeding reported.   Goal of Therapy:  INR 2-3 Heparin level 0.3-0.5 units/ml Monitor platelets by anticoagulation protocol: Yes   Plan:  - Continue heparin infusion at 1100 units/hr - Will f/up daily CBC and heparin level - Coumadin 4mg  PO x 1 again today - Will check daily INR  Thanks, Aurelius Gildersleeve K. Allena Katz, PharmD, BCPS.  Clinical Pharmacist Pager [phone]. 08/04/2012 10:26 AM

## 2012-08-04 NOTE — Progress Notes (Signed)
Gastric residual at 10am: 10 cc  Minor, [name]

## 2012-08-04 NOTE — Progress Notes (Signed)
I called the patient's son Aspynn Clover for an update and to answer his concerns. Mr Winegardner had several concerns. He stated that "somebody" had called him from the hospital stating that patient will have "bigger lumen" G-tube through which "chunks of food" can be given and he is extremely dissatisfied and angry over the G-tube that she currently has and he would not have authorized this G-tube. I explained to Mr. Fiorella that we do not give chunks of food through any G-tube and this is the standard PEG tube that is placed by interventional radiology for tube feeding and nutrition until she is able to tolerate oral diet in future.  He also demanded to know about a Barium study that "somebody from the hospital" had called to find about the "clot". I explained to him that there is no barium study planned at all, it might have been the speech therapist when patient had undergone MBS or it may be the heparin drip for the intracardiac thrombus. Mr Senne then became angry that he knows the difference between barium and heparin and "people" calling him are giving him conflicting messages. He also then stated that he feels that his mother is not any better, so he does not authorize anyone to discharge her. After this statement, Mr Zale hung up the phone on me.    Aylan Bayona M.D. [facility] 08/04/2012, 5:16 PM  Pager: [phone]

## 2012-08-05 ENCOUNTER — Inpatient Hospital Stay (HOSPITAL_COMMUNITY): Payer: Medicare Other

## 2012-08-05 LAB — GLUCOSE, CAPILLARY: Glucose-Capillary: 143 mg/dL — ABNORMAL HIGH (ref 70–99)

## 2012-08-05 LAB — CBC
HCT: 32.7 % — ABNORMAL LOW (ref 36.0–46.0)
MCH: 23.5 pg — ABNORMAL LOW (ref 26.0–34.0)
MCV: 74 fL — ABNORMAL LOW (ref 78.0–100.0)
Platelets: 216 10*3/uL (ref 150–400)
RBC: 4.42 MIL/uL (ref 3.87–5.11)

## 2012-08-05 MED ORDER — WARFARIN SODIUM 6 MG PO TABS
6.0000 mg | ORAL_TABLET | Freq: Once | ORAL | Status: AC
  Administered 2012-08-05: 6 mg
  Filled 2012-08-05: qty 1

## 2012-08-05 MED ORDER — PANTOPRAZOLE SODIUM 40 MG PO PACK
40.0000 mg | PACK | Freq: Every day | ORAL | Status: DC
  Administered 2012-08-06 – 2012-08-12 (×7): 40 mg
  Filled 2012-08-05 (×14): qty 20

## 2012-08-05 MED ORDER — FUROSEMIDE 20 MG PO TABS
20.0000 mg | ORAL_TABLET | Freq: Every day | ORAL | Status: DC
  Administered 2012-08-05 – 2012-08-06 (×2): 20 mg
  Filled 2012-08-05 (×2): qty 1

## 2012-08-05 NOTE — Progress Notes (Signed)
,Patient ID: Jenny Brady  female  EAV:[id number]    DOB: 12-Feb-1926    DOA: 07/21/2012  PCP: [name], MD  Interim Summary: 78 yo female w/hx pf CAD, dementia came in on 4/13 for resp distress & R facial droop/hemiparesis.  Pt's workup found to have large left MCA CVA secondary to possible intracardiac thrombus.  She also failed swallow eval initially, temporary feeding tube placed on 07/23/12. Patient was finally placed on dysphagia 1 diet with pudding thick liquids by speech therapy on 07/29/12. However patient's oral intake continued to be poor, interventional radiology was consulted and patient underwent PEG tube placement on 08/02/2012.   Assessment/Plan:    Acute ischemic large left MCA stroke:  - CT head showed large left MCA distribution infarct, most likely embolic. Not a candidate for thrombolytics due to unknown time of onset, delayed presentation. - MRI of the brain showed large left hemispheric middle cerebral artery distribution infarct involving left temporal lobe, left frontal lobe and left opercular/sub insular region, involvement of the left lenticular nucleus/left corona radiata with local mass effect. Thrombus within the left middle cerebral artery suspected with occluded branches distal to this region. -  2-D echo showed EF of 15%, severe tricuspid regurgitation, moderate PHTN, PAP 62, moderate MR -  carotid Dopplers showed no ICA stenosis, antegrade vertebral flow - TEE on 4/18 showed possible intracardiac thrombus, Dr Rito Ehrlich discussed with cardiology and  Dr Shirlee Latch feels that the patient does have intracardiac thrombus.  - Started on Coumadin per stroke service conditions for intracardiac thrombus, continue heparin until discharge  Dysphagia: From CVA - tube feed started, so far tolerating via G tube - Continue dysphagia 1 diet, serial swallow evaluations       DEMENTIA-mod-severe: alert and awake, follows some commands.   Hyperthyroidism-s/p Alyrica Thurow admin:  Now hypothyroidism, TSH 13.9 - Continue Synthroid     Coronary artery disease with acute on chronic systolic CHF (EF was 25-30% in 2012) - 2-D echo shows EF of 15% , BP borderline low  - Mild fluid overload, placed on oral Lasix - Chest x-ray showed residual left lower lobe airspace opacity and improving pulmonary edema pattern - Patient would need to be on beta blocker, ACE inhibitor once BP is more stable for cardiomyopathy  CAP (community acquired pneumonia)- left lung Patient has completed 8 days of antibiotics.      UTI (lower urinary tract infection):  - Urine culture showed no growth  DVT Prophylaxis: SCD's  Code Status:  Full code  Disposition: Per patient's son, Jenny Brady  Goals of care  1) he wants her to be FULL CODE STATUS and continue aggressive care "to keep her alive and going" 2) GTube 3) skilled nursing placement   Hopefully will DC to skilled nursing facility tomorrow if any bed available  Subjective: G tube Placed, started on tube feeds, alert and awake    Objective: Weight change:   Intake/Output Summary (Last 24 hours) at 08/05/12 1218 Last data filed at 08/05/12 0700  Gross per 24 hour  Intake      0 ml  Output   2725 ml  Net  -2725 ml   Blood pressure 104/43, pulse 79, temperature 98 F (36.7 C), temperature source Oral, resp. rate 18, height 5\' 6"  (1.676 m), weight 85.5 kg (188 lb 7.9 oz), SpO2 97.00%.  Physical Exam: General:  alert and awake CVS: S1-S2 clear Chest: Decreased breath sound at the bases with scattered rhonchi Abdomen: obese, soft, nontender, G-tube+ Extremities:  no c/c/e Neuro: right hemiparesis  Lab Results: Basic Metabolic Panel:  Recent Labs Lab 08/04/12 0859  NA 137  K 4.1  CL 101  CO2 29  GLUCOSE 142*  BUN 15  CREATININE 0.58  CALCIUM 8.8   CBC:  Recent Labs Lab 08/04/12 0625 08/05/12 0650  WBC 5.7 4.3  HGB 10.7* 10.4*  HCT 33.2* 32.7*  MCV 73.5* 74.0*  PLT 211 216   CBG:  Recent  Labs Lab 08/04/12 1134 08/04/12 1630 08/04/12 2222 08/05/12 0707 08/05/12 1118  GLUCAP 152* 141* 111* 120* 143*     Studies/Results: Dg Chest 1 View  07/21/2012   IMPRESSION:  1.  Cardiomegaly and pulmonary edema. 2.  Small left pleural effusion and basilar airspace disease which could be due to atelectasis or pneumonia.   Original Report Authenticated By: Holley Dexter, M.D.    Dg Chest 2 View  07/22/2012   IMPRESSION:  1.  Persistent pulmonary edema. 2.  No change in aeration to the left lower lobe.   Original Report Authenticated By: Signa Kell, M.D.    Ct Head Wo Contrast  07/21/2012  *RADIOLOGY REPORT*  Clinical Data: Altered mental status.  The patient is unresponsive.  CT HEAD WITHOUT CONTRAST  Technique:  Contiguous axial images were obtained from the base of the skull through the vertex without contrast.  Comparison: 02/09/2008  Findings: The patient has an acute large non hemorrhagic left middle cerebral artery distribution infarct with slight edema.  No midline shift at this time. There is a hyper dense branch of the left middle cerebral artery seen on image number 11 of series 2, consistent with acute thrombosis.  There is an old left occipital infarct, new since 02/09/2008.  Diffuse cerebral cortical atrophy, most prominent in the temporal lobes.  No significant osseous abnormality.  IMPRESSION:  1.  Large nonhemorrhagic left middle cerebral artery infarct with early edema without midline shift. 2.  Old left occipital infarct. 3.  Atrophy, most prominent in the temporal lobes.   Original Report Authenticated By: Francene Boyers, M.D.    Mr Brain Wo Contrast  07/22/2012  *RADIOLOGY REPORT*  Clinical Data: Altered mental status.  Abnormal CT.  MRI HEAD WITHOUT CONTRAST  Technique:  Multiplanar, multiecho pulse sequences of the brain and surrounding structures were obtained according to standard protocol without intravenous contrast.  Comparison: 07/21/2012 CT.  03/08/2004 MR.   Findings: Motion degraded exam.  MR angiogram was ordered although the patient not able to cooperate for this exam.  Large left hemispheric middle cerebral artery distribution infarct involving left temporal lobe, left frontal lobe and left opercular/sub insular region.  Involvement of the left lenticular nucleus/left corona radiata.  No intracranial hemorrhage.  Local mass effect without midline shift.  Remote left occipital lobe infarct with encephalomalacia.  Mild small vessel disease type changes.  Global atrophy without hydrocephalus.  No intracranial mass lesion detected on this unenhanced exam.  Limited evaluation of the intracranial vascular structures secondary to the motion.  Thrombus within the left middle cerebral artery suspected with occluded branches distal to this region.  IMPRESSION: Motion degraded exam.  Large left middle cerebral artery distribution infarct as noted above.  Thrombus within the left middle cerebral artery suspected with occluded branches distal to this region.  This has been made a PRA call report utilizing dashboard call feature.   Original Report Authenticated By: Lacy Duverney, M.D.     Medications: Scheduled Meds: . free water  150 mL Per Tube Q8H  . furosemide  20 mg Per Tube Daily  . levothyroxine  75 mcg Oral QAC breakfast  . metoprolol  2.5 mg Intravenous Q12H  . pantoprazole (PROTONIX) IV  40 mg Intravenous QHS  . simvastatin  20 mg Oral q1800  . Warfarin - Pharmacist Dosing Inpatient   Does not apply q1800      LOS: 15 days   Ashritha Desrosiers M.D.  [facility]  08/05/2012, 12:18 PM  Pager: [phone]   If 7PM-7AM, please contact night-coverage [email or web address] Password [email or web address]

## 2012-08-05 NOTE — Progress Notes (Signed)
OT TREATMENT NOTE   08/05/12 0800  OT Visit Information  Last OT Received On 08/05/12  Assistance Needed +2  PT/OT Co-Evaluation/Treatment Yes  OT Time Calculation  OT Start Time 0858  OT Stop Time 0936  OT Time Calculation (min) 38 min  Precautions  Precautions Fall  ADL  Eating/Feeding NPO  Grooming Maximal assistance  Where Assessed - Grooming Supported sitting  Upper Body Bathing +1 Total assistance  Transfers/Ambulation Related to ADLs +2 total A. used 3 musketeer approach  ADL Comments L gaze preference. Using hand over hand to initiate pt washing face using L hand. Able to maintain focused attention on task. attempting to use gestural cues . R lat lean but able to maintain static sitting EOB with SBA until fatigued. Facilitated upright trunk control and weight shifting via BLE using 3 musketeer approach. Pt able to maintain standing with blocking R knee x 1-2 min   Cognition  Arousal/Alertness Awake/alert  Behavior During Therapy Flat affect  Overall Cognitive Status Impaired/Different from baseline  Area of Impairment Attention;Following commands;Problem solving  Orientation Level (unable to assess due to aphasia)  Difficult to assess due to Impaired communication  Bed Mobility  Bed Mobility Supine to Sit  Rolling Right 1: +2 Total assist  Rolling Right: Patient Percentage 20%  Details for Bed Mobility Assistance Pt does not initiate  Transfers  Transfers Sit to Stand;Stand to Sit  Sit to Stand 1: +2 Total assist;From bed  Sit to Stand: Patient Percentage 20%  Stand to Sit: Patient Percentage 20%  Details for Transfer Assistance Used 3 musketeer approach. Pt able to facilitate use of RLE to support in standing. forward posture/head  Exercises  Exercises Other exercises  Other Exercises  Other Exercises RUE PROM  OT - End of Session  Equipment Utilized During Treatment Gait belt  Activity Tolerance Patient limited by fatigue  Patient left in chair;with call  bell/phone within reach;with chair alarm set  Nurse Communication Mobility status;Need for lift equipment  OT Assessment/Plan  Comments on Treatment Session Using neuromuscular facilitation techniques to facilitate upright posture in standing. Pt with L bias but beginning to attend to R. Pt appears to respond better with gestural cues and hand over hand initiation. Good participation. No spontaneous communication.  OT Plan Discharge plan remains appropriate  OT Frequency Min 3X/week  Follow Up Recommendations SNF  Acute Rehab OT Goals  OT Goal Formulation With patient  Time For Goal Achievement 08/19/12  Potential to Achieve Goals Good  ADL Goals  Pt Will Perform Grooming with min assist;Sitting, edge of bed;Supported;with cueing (comment type and amount)  ADL Goal: Grooming - Progress Progressing toward goals  Pt Will Perform Upper Body Bathing with max assist;with mod assist;Sitting, edge of bed;Supported;with cueing (comment type and amount)  ADL Goal: Upper Body Bathing - Progress Progressing toward goals  Pt Will Transfer to Toilet with max assist;with 1+ total assist;with DME  ADL Goal: Toilet Transfer - Progress Progressing toward goals  Additional ADL Goal #1 Pt will attend to R side to perform grooming and UB bathing tasks with mod verbal/physical cues to initiate and complete while seated with support at EOB  ADL Goal: Additional Goal #1 - Progress Progressing toward goals  Arm Goals  Pt Will Tolerate PROM to decrease contracture;Right upper extremity;3 sets;10 reps  Arm Goal: PROM - Progress Progressing toward goal  OT General Charges  $OT Visit 1 Procedure  OT Treatments  $Self Care/Home Management  23-37 mins  $Neuromuscular Re-education 8-22 mins  [facility], OTR/L  [phone] 08/05/2012

## 2012-08-05 NOTE — Progress Notes (Signed)
Physical Therapy Treatment Patient Details Name: Jenny Brady MRN: [id number] DOB: 04-Sep-1925 Today's Date: 08/05/2012 Time: 4098-1191 PT Time Calculation (min): 33 min  PT Assessment / Plan / Recommendation Comments on Treatment Session  Adm. s/p L MCA CVA. Improved participation today. Able to transfer to chair with +2totalpt20%. Affected mostly by cognition and ability to comprehend cues.     Follow Up Recommendations  SNF     Does the patient have the potential to tolerate intense rehabilitation     Barriers to Discharge        Equipment Recommendations  Other (comment) (defer to SNF)    Recommendations for Other Services    Frequency Min 3X/week   Plan Discharge plan remains appropriate;Frequency remains appropriate    Precautions / Restrictions Precautions Precaution Comments: right neglect, vision deficits, wears L mitt, RUE 0/5 Restrictions Weight Bearing Restrictions: No   Pertinent Vitals/Pain Moaning throughout, aphasic, calming cues    Mobility  Bed Mobility Bed Mobility: Supine to Sit;Sitting - Scoot to Edge of Bed Supine to Sit: 1: +2 Total assist Supine to Sit: Patient Percentage: 0% Sitting - Scoot to Edge of Bed: 1: +1 Total assist Details for Bed Mobility Assistance: max faciliatory cues to sequence Transfers Transfers: Sit to Stand;Stand to Sit;Stand Pivot Transfers Sit to Stand: 1: +2 Total assist Sit to Stand: Patient Percentage: 20% Stand to Sit: 1: +2 Total assist Stand to Sit: Patient Percentage: 20% Stand Pivot Transfers: 1: +2 Total assist Stand Pivot Transfers: Patient Percentage: 20% Details for Transfer Assistance: bilateral facilitation for anterior translation of trunk over BOS and trunk extension; sit<>stand x2; pt initiating steps to the chair with max facilitation Ambulation/Gait Ambulation/Gait Assistance: Not tested (comment)      PT Goals Acute Rehab PT Goals PT Goal: Supine/Side to Sit - Progress: Progressing toward  goal PT Goal: Sit at Edge Of Bed - Progress: Progressing toward goal PT Goal: Sit to Stand - Progress: Progressing toward goal PT Goal: Stand to Sit - Progress: Progressing toward goal PT Transfer Goal: Bed to Chair/Chair to Bed - Progress: Progressing toward goal  Visit Information  Assistance Needed: +2    Subjective Data  Subjective: moaning, nods to all questions   Cognition  Cognition Arousal/Alertness: Awake/alert Behavior During Therapy: Flat affect Overall Cognitive Status:  (globally aphasic) Area of Impairment: Attention;Following commands;Awareness Orientation Level: Disoriented to;Person;Place;Time;Situation Current Attention Level: Focused Following Commands: Follows one step commands inconsistently Awareness: Intellectual;Emergent;Anticipatory 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 washing face and RUE, attempting to bring attention to RUE)  End of Session PT - End of Session Equipment Utilized During Treatment: Gait belt Activity Tolerance: Patient tolerated treatment well Patient left: in chair;with call bell/phone within reach;with chair alarm set Nurse Communication: Mobility status   GP     [facility] HELEN 08/05/2012, 9:56 AM

## 2012-08-05 NOTE — Progress Notes (Signed)
ANTICOAGULATION CONSULT NOTE - Follow Up Consult  Pharmacy Consult for Heparin and Coumadin Indication: stroke and possible LV thrombus per TEE  Allergies  Allergen Reactions  . Hyoscyamine   . Ivp Dye (Iodinated Diagnostic Agents)   . Septra (Sulfamethoxazole W-Trimethoprim)     Patient Measurements: Height: 5\' 6"  (167.6 cm) Weight: 188 lb 7.9 oz (85.5 kg) IBW/kg (Calculated) : 59.3 Heparin Dosing Weight: 77 kg  Vital Signs: Temp: 97.8 F (36.6 C) (04/28 1400) Temp src: Oral (04/28 1400) BP: 108/62 mmHg (04/28 1400) Pulse Rate: 79 (04/28 1400)  Labs:  Recent Labs  08/03/12 0615 08/04/12 0625 08/04/12 0859 08/05/12 0650  HGB 10.4* 10.7*  --  10.4*  HCT 32.9* 33.2*  --  32.7*  PLT 196 211  --  216  LABPROT  --  14.2  --  14.5  INR  --  1.11  --  1.15  HEPARINUNFRC 0.32 0.36  --  0.32  CREATININE  --   --  0.58  --     Estimated Creatinine Clearance: 57.7 ml/min (by C-G formula based on Cr of 0.58).  Assessment:   Heparin level remains therapeutic on 1100 units/hr.   INR slow to increase. Has had Coumadin 4 mg daily x 2.  Goal of Therapy:  INR 2-3 Heparin level 0.3-0.5 units/ml Monitor platelets by anticoagulation protocol: Yes   Plan:   Continue heparin drip at 1100 units/hr.  Increase today's Coumadin dose to 6 mg (per tube).  Continue daily heparin level, PT/INR and CBC.     Protonix 40 mg IV daily changed to per tube.   Continues on Metoprolol IV. On Coreg at home, which could be crushed and given per tube.  Dennie Fetters, [place] Pager: [phone] 08/05/2012,2:52 PM

## 2012-08-06 LAB — PROTIME-INR: Prothrombin Time: 15.6 seconds — ABNORMAL HIGH (ref 11.6–15.2)

## 2012-08-06 LAB — GLUCOSE, CAPILLARY
Glucose-Capillary: 111 mg/dL — ABNORMAL HIGH (ref 70–99)
Glucose-Capillary: 159 mg/dL — ABNORMAL HIGH (ref 70–99)

## 2012-08-06 LAB — HEPARIN LEVEL (UNFRACTIONATED): Heparin Unfractionated: 0.27 IU/mL — ABNORMAL LOW (ref 0.30–0.70)

## 2012-08-06 LAB — CBC
Hemoglobin: 10.7 g/dL — ABNORMAL LOW (ref 12.0–15.0)
MCH: 23.6 pg — ABNORMAL LOW (ref 26.0–34.0)
RBC: 4.54 MIL/uL (ref 3.87–5.11)

## 2012-08-06 MED ORDER — WARFARIN SODIUM 5 MG PO TABS: 5.0000 mg | ORAL_TABLET | Freq: Every day | ORAL | Status: DC

## 2012-08-06 MED ORDER — FREE WATER: 150.0000 mL | Freq: Three times a day (TID) | Status: DC

## 2012-08-06 MED ORDER — PANTOPRAZOLE SODIUM 40 MG PO PACK: 40.0000 mg | PACK | Freq: Every day | ORAL | Status: DC

## 2012-08-06 MED ORDER — FUROSEMIDE 20 MG PO TABS: 10.0000 mg | ORAL_TABLET | Freq: Every day | ORAL | Status: DC

## 2012-08-06 MED ORDER — JEVITY 1.2 CAL PO LIQD: 1000.0000 mL | ORAL | Status: DC

## 2012-08-06 MED ORDER — LISINOPRIL 2.5 MG PO TABS: 2.5000 mg | ORAL_TABLET | Freq: Every day | ORAL | Status: DC

## 2012-08-06 MED ORDER — VITAMIN D 1000 UNITS PO TABS: 1000.0000 [IU] | ORAL_TABLET | Freq: Every day | ORAL | Status: DC

## 2012-08-06 MED ORDER — ALBUTEROL SULFATE (5 MG/ML) 0.5% IN NEBU: 2.5000 mg | INHALATION_SOLUTION | RESPIRATORY_TRACT | Status: DC | PRN

## 2012-08-06 MED ORDER — SIMVASTATIN 20 MG PO TABS: 20.0000 mg | ORAL_TABLET | Freq: Every day | ORAL | Status: DC

## 2012-08-06 MED ORDER — WARFARIN SODIUM 6 MG PO TABS
6.0000 mg | ORAL_TABLET | Freq: Once | ORAL | Status: AC
  Administered 2012-08-06: 6 mg
  Filled 2012-08-06: qty 1

## 2012-08-06 MED ORDER — FUROSEMIDE 20 MG PO TABS
10.0000 mg | ORAL_TABLET | Freq: Every day | ORAL | Status: DC
  Administered 2012-08-07 – 2012-08-13 (×7): 10 mg
  Filled 2012-08-06 (×7): qty 0.5

## 2012-08-06 MED ORDER — LEVOTHYROXINE SODIUM 75 MCG PO TABS: 75.0000 ug | ORAL_TABLET | Freq: Every day | ORAL | Status: DC

## 2012-08-06 NOTE — Discharge Summary (Signed)
Physician Discharge Summary  Patient ID: Jenny Brady MRN: [id number] DOB/AGE: 1925/07/12 79 y.o.  Admit date: 07/21/2012 Discharge date: 08/06/2012  Primary Care Physician:  [name], MD  Discharge Diagnoses:    . Acute ischemic left MCA stroke . DEMENTIA-mod-severe . Coronary artery disease . acute on chronic Systolic CHF . Hyperglycemia without ketosis . UTI (lower urinary tract infection) . CAP (community acquired pneumonia)- left lung . Hyperthyroidism-s/p Judas Mohammad admin- now hypothyroidism  . Dementia with behavioral disturbance . Dysphagia, unspecified- PEG tube placed, on Jevity 1.2 CAL at 60 cc/hour     Intracardiac thrombus  Consults: Neurology, Dr. Pearlean Brownie                   Interventional radiology                    [facility] for TEE                   Speech therapy                 Recommendations for Outpatient Follow-up:  1) please adjust Coumadin dose according to PT/INR, goal 2-3  2) titrate lisinopril and Lasix as BP tolerates, not on beta blockers secondary to hypotension     Discharge Medications:   Medication List    STOP taking these medications       ALPRAZolam 0.5 MG tablet  Commonly known as:  XANAX     aspirin EC 81 MG tablet     carvedilol 6.25 MG tablet  Commonly known as:  COREG     potassium chloride SA 20 MEQ tablet  Commonly known as:  K-DUR,KLOR-CON      TAKE these medications       albuterol (5 MG/ML) 0.5% nebulizer solution  Commonly known as:  PROVENTIL  Take 0.5 mLs (2.5 mg total) by nebulization every 4 (four) hours as needed for wheezing or shortness of breath.     cholecalciferol 1000 UNITS tablet  Commonly known as:  VITAMIN D  Place 1 tablet (1,000 Units total) into feeding tube daily.     feeding supplement (JEVITY 1.2 CAL) Liqd  Place 1,000 mLs into feeding tube continuous. At 27ml/hr     free water Soln  Place 150 mLs into feeding tube every 8 (eight) hours.     furosemide 20 MG tablet   Commonly known as:  LASIX  Place 0.5 tablets (10 mg total) into feeding tube daily.  Start taking on:  08/07/2012     levothyroxine 75 MCG tablet  Commonly known as:  SYNTHROID, LEVOTHROID  Place 1 tablet (75 mcg total) into feeding tube daily before breakfast.     lisinopril 2.5 MG tablet  Commonly known as:  ZESTRIL  Place 1 tablet (2.5 mg total) into feeding tube daily.     memantine 5 MG tablet  Commonly known as:  NAMENDA  Take 1 tablet (5 mg total) by mouth 2 (two) times daily.     pantoprazole sodium 40 mg/20 mL Pack  Commonly known as:  PROTONIX  Place 20 mLs (40 mg total) into feeding tube at bedtime.     simvastatin 20 MG tablet  Commonly known as:  ZOCOR  Place 1 tablet (20 mg total) into feeding tube daily at 6 PM.     VITAMIN B-12 PO  Take 1 tablet by mouth daily.     warfarin 5 MG tablet  Commonly known as:  COUMADIN  Place 1  tablet (5 mg total) into feeding tube daily. ADJUST DOSE ACCORDING TO PT/INR         Brief H and P: For complete details please refer to admission H and P, but in brief Jenny Brady is a 79 y.o. female  has a past medical history of Hyperthyroidism; Coronary artery disease; and GERD (gastroesophageal reflux disease). Patient was brought to the ED poorly responsive, respiratory distress and wheezing. Her H&P, her son was at the site was intoxicated and unable to provide any history. EMS stated that the house conditions were poor with evidence of hoarding. She was brought [facility] long on emergency department and was noted to have pronounced right facial droop and right hemiparesis. A stat CT scan was ordered that did show acute nonhemorrhagic left MCA stroke. Neurology was consulted and patient was transferred to [facility]. Patient was not considered a candidate for TPA given unclear duration of her symptoms. Patient was aphasic. The workup was significant for UA suggestive of mild UTI and chest x-ray suggestive of mild pulmonary  edema and in atelectasis versus infiltrate   Hospital Course:  79 yo female w/hx pf CAD, dementia came in on 4/13 for resp distress & R facial droop/hemiparesis. Pt's workup found to have large left MCA CVA secondary to possible intracardiac thrombus. She also failed swallow eval initially, temporary feeding tube placed on 07/23/12. Patient was finally placed on dysphagia 1 diet with pudding thick liquids by speech therapy on 07/29/12. However patient's oral intake continued to be poor and inconsistent. Interventional radiology was consulted and patient underwent PEG tube placement on 08/02/2012 Acute ischemic large left MCA stroke:  - CT head showed large left MCA distribution infarct, most likely embolic. Not a candidate for thrombolytics due to unknown time of onset, delayed presentation.  - MRI of the brain showed large left hemispheric middle cerebral artery distribution infarct involving left temporal lobe, left frontal lobe and left opercular/sub insular region, involvement of the left lenticular nucleus/left corona radiata with local mass effect. Thrombus within the left middle cerebral artery suspected with occluded branches distal to this region.  - 2-D echo showed EF of 15%, severe tricuspid regurgitation, moderate PHTN, PAP 62, moderate MR  - carotid Dopplers showed no ICA stenosis, antegrade vertebral flow  - TEE on 4/18 showed EF of 20-25%, diffuse hypokinesis, no PFO, small mobile structure attached to the anterior wall of the left ventricle cannot rule out thrombus. Dr Rito Ehrlich discussed with cardiology and Dr Shirlee Latch felt that the patient does have intracardiac thrombus.  - Started on Coumadin per stroke service recommendations for intracardiac thrombus after PEG tube was placed and patient was tolerating tube feeds. Patient has remained on heparin drip to be discontinued at discharge.  - continue statin  Dysphagia: From CVA. Patient failed multiple swallow evaluations. She was placed on  dysphagia 1 diet on 07/29/12 but her oral intake remained poor and inconsistent. Interventional radiology was consulted and patient underwent PEG tube placement on 08/02/2012 - tube feeds started, so far tolerating Jevity 1.2 CAL at 60 cc/ hour via G tube  - Continue free water 150 cc q8hrs - Continue dysphagia 1 diet with pudding thick liquids as tolerated,outpatient speech therapy, swallow evaluations to upgrade diet as tolerated at the facility  DEMENTIA-mod-severe: alert and awake, follows some commands.   Hyperthyroidism-s/p Courney Garrod admin: Now hypothyroidism, TSH 13.9  - Continue Synthroid increased dose to daily. Follow TSH in 4-6 weeks.   Coronary artery disease with acute on  chronic systolic CHF (EF was 25-30% in 2012)  - TEE showed EF of 20-25%, diffuse hypokinesis  - Mild fluid overload, placed on oral Lasix as tolerated - Chest x-ray showed residual left lower lobe airspace opacity and improving pulmonary edema pattern  - Patient would need to be on Coreg once BP more stable. Placed her on low-dose lisinopril for cardiomyopathy. Titrate as tolerated.   CAP (community acquired pneumonia)- left lung: Patient has completed 8 days of antibiotics.   UTI (lower urinary tract infection):  Urine culture showed no growth  Per patient's son, [name] [place]  Goals of care  1) FULL CODE STATUS and continue aggressive care "to keep her alive and going"  2) GTube  3) skilled nursing placement  He declined palliative care consult during hospitalization  Day of Discharge BP 95/48  Pulse 63  Temp(Src) 98.1 F (36.7 C) (Oral)  Resp 20  Ht 5\' 6"  (1.676 m)  Wt 85.5 kg (188 lb 7.9 oz)  BMI 30.44 kg/m2  SpO2 95%  Physical Exam:  General: alert and awake  CVS: S1-S2 clear  Chest: Decreased breath sound at the bases  Abdomen: obese, soft, nontender, G-tube+  Extremities: no c/c/e  Neuro: right hemiparesis   The results of significant diagnostics from this hospitalization  (including imaging, microbiology, ancillary and laboratory) are listed below for reference.    LAB RESULTS: Basic Metabolic Panel:  Recent Labs Lab 08/04/12 0859  NA 137  K 4.1  CL 101  CO2 29  GLUCOSE 142*  BUN 15  CREATININE 0.58  CALCIUM 8.8   CBC:  Recent Labs Lab 08/05/12 0650 08/06/12 0610  WBC 4.3 4.6  HGB 10.4* 10.7*  HCT 32.7* 34.1*  MCV 74.0* 75.1*  PLT 216 232  CBG:  Recent Labs Lab 08/06/12 0648 08/06/12 1226  GLUCAP 159* 147*    Significant Diagnostic Studies:   CT of the brain 07/21/2012 1. Large nonhemorrhagic left middle cerebral artery infarct with early edema without midline shift. 2. Old left occipital infarct. 3. Atrophy, most prominent in the temporal lobes.   MRI of the brain 07/22/2012 Motion degraded exam. Large left middle cerebral artery distribution infarct as noted above. Thrombus within the left middle cerebral artery suspected with occluded branches distal to this region.   MRA of the brain 1. Left MCA occluded or nearly occluded in the M1 segment just beyond its origin. This corresponds to the earlier MRI findings. Faint more distal left MCA flow signal; little to no M2 segment  flow signal. 2. Moderately degraded by motion despite repeated imaging  attempts.   2D Echocardiogram EF 15% with no source of embolus.   Carotid Doppler No evidence of hemodynamically significant internal carotid artery stenosis. Vertebral artery flow is antegrade.   TEE 07/26/2012 - ejection fraction 20-25%. There was a small mobile structure attached to the anterior wall of the LV. This may have been a loose trabeculation but cannot rule out thrombus given history."Would consider anticoagulation if she would be a safe candidate"  TCD Normal mean flow velocities in right middle cerebral, basilar, right vertebral, bilateral carotid siphons and opthalmic arteries. Suboptimal windows throghout limits exam.   CXR  07/22/2012 1. Persistent pulmonary edema. 2. No  change in aeration to the left lower lobe.  07/21/2012 1. Cardiomegaly and pulmonary edema. 2. Small left pleural effusion and basilar airspace disease which could be due to atelectasis or pneumonia.   EKG normal sinus rhythm, LBBB.    Disposition and Follow-up: Discharge Orders  Future Orders Complete By Expires     Discharge instructions  As directed     Comments:      Discharge diet: Dysphagia 1, pudding thick liquids although patient's oral intake has been poor. Currently on Jevity 1.2 cal at 60cc/hr.        DISPOSITION: SNF   DIET: Discharge diet: Dysphagia 1, pudding thick liquids although patient's oral intake has been poor. Currently on Jevity 1.2 cal at 60cc/hr.  ACTIVITY: As tolerated  TESTS THAT NEED FOLLOW-UP TSH IN 4-6 WEEKS  DISCHARGE FOLLOW-UP Follow-up Information   Follow up with ROBERTS, [name], MD. Schedule an appointment as soon as possible for a visit in 10 days.   Contact information:   [address] [place] [address] [phone]       Follow up with Gates Rigg, MD. Schedule an appointment as soon as possible for a visit in 2 months. (for stroke follow-up)    Contact information:   [address] [place] [address] [phone]       Time spent on Discharge: 48 mins  Signed:   Joya Willmott M.D. [facility] 08/06/2012, 2:39 PM Pager: [phone]

## 2012-08-06 NOTE — Progress Notes (Signed)
ANTICOAGULATION CONSULT NOTE - Follow Up Consult  Pharmacy Consult for Heparin and Coumadin Indication: stroke and possible LV thrombus per TEE  Allergies  Allergen Reactions  . Hyoscyamine   . Ivp Dye (Iodinated Diagnostic Agents)   . Septra (Sulfamethoxazole W-Trimethoprim)     Patient Measurements: Height: 5\' 6"  (167.6 cm) Weight: 188 lb 7.9 oz (85.5 kg) IBW/kg (Calculated) : 59.3 Heparin Dosing Weight: 77 kg  Vital Signs: Temp: 98.1 F (36.7 C) (04/29 0600) Temp src: Oral (04/29 0117) BP: 120/63 mmHg (04/29 0600) Pulse Rate: 86 (04/29 0600)  Labs:  Recent Labs  08/04/12 0625 08/04/12 0859 08/05/12 0650 08/06/12 0610  HGB 10.7*  --  10.4* 10.7*  HCT 33.2*  --  32.7* 34.1*  PLT 211  --  216 232  LABPROT 14.2  --  14.5 15.6*  INR 1.11  --  1.15 1.27  HEPARINUNFRC 0.36  --  0.32 0.27*  CREATININE  --  0.58  --   --     Estimated Creatinine Clearance: 57.7 ml/min (by C-G formula based on Cr of 0.58).  Assessment:   Heparin level has dropped to just therapeutic on 1100 units/hr.   INR slow to increase. Has had Coumadin 4 mg daily x 2, then 6 mg x 1.  Goal of Therapy:  INR 2-3 Heparin level 0.3-0.5 units/ml Monitor platelets by anticoagulation protocol: Yes   Plan:   Increase heparin drip to 1250 units/hr.  Repeat Coumadin dose to 6 mg (per tube).  Continue daily heparin level, PT/INR and CBC.    Continues on Metoprolol IV. On Coreg at home, which could be crushed and given per tube.  Dennie Fetters, RPh Pager: [phone] 08/06/2012,8:51 AM

## 2012-08-06 NOTE — Progress Notes (Signed)
NUTRITION FOLLOW UP  Intervention:   Continue Jevity 1.2 @ 60 ml/hr with 150 ml H2O flush every 6 hours via PEG.   TF regimen provides 1728 kcal, 80 grams protein, 1166 ml H2O; total free water 1766 ml   Nutrition Dx:   Inadequate oral intake related to inability to eat as evidenced by NPO status; ongoing.  Goal:  Pt to meet >/= 90% of their estimated nutrition needs; met.   Monitor:  TF tolerance, weight trend, labs  Assessment:   Pt admitted with large MCA stroke. Pt discussed during rounds and with RN. Diet advanced by SLP to Dysphagia 1 with Pudding thick liquids, per RN pt is not eating, nothing consumed this am for Breakfast.  PEG 4/24.  Plan is for SNF at d/c, possibly today.     Height: Ht Readings from Last 1 Encounters:  07/22/12 5\' 6"  (1.676 m)    Weight Status:   Wt Readings from Last 1 Encounters:  08/04/12 188 lb 7.9 oz (85.5 kg)  Admission weight 188 lb 4/14  Re-estimated needs:  Kcal: 1600-1750  Protein: 80-90 grams  Fluid: > 1.6 L/day  Skin: no issues noted  Diet Order: Dysphagia 1 with Pudding Thick Liquids Meal Completion: 0%. Pt refusing.     Intake/Output Summary (Last 24 hours) at 08/06/12 0956 Last data filed at 08/06/12 0600  Gross per 24 hour  Intake      0 ml  Output   1300 ml  Net  -1300 ml    Last BM: 4/29   Labs:   Recent Labs Lab 08/04/12 0859  NA 137  K 4.1  CL 101  CO2 29  BUN 15  CREATININE 0.58  CALCIUM 8.8  GLUCOSE 142*    CBG (last 3)   Recent Labs  08/05/12 1621 08/05/12 2145 08/06/12 0648  GLUCAP 127* 111* 159*    Scheduled Meds: . free water  150 mL Per Tube Q8H  . furosemide  20 mg Per Tube Daily  . levothyroxine  75 mcg Oral QAC breakfast  . metoprolol  2.5 mg Intravenous Q12H  . pantoprazole sodium  40 mg Per Tube QHS  . simvastatin  20 mg Oral q1800  . warfarin  6 mg Per Tube ONCE-1800  . Warfarin - Pharmacist Dosing Inpatient   Does not apply q1800    Continuous Infusions: .  feeding supplement (JEVITY 1.2 CAL) 1,000 mL (07/31/12 2218)  . heparin 1,250 Units/hr (08/06/12 0910)    Kendell Bane RD, LDN, CNSC [phone] Pager [phone] After Hours Pager

## 2012-08-06 NOTE — Progress Notes (Signed)
Rt asked patient if she wanted to wear CPAP tonight.  Patient stated that she had the nasal cannula and did not want to wear CPAP.

## 2012-08-07 LAB — GLUCOSE, CAPILLARY: Glucose-Capillary: 145 mg/dL — ABNORMAL HIGH (ref 70–99)

## 2012-08-07 LAB — CBC
HCT: 34 % — ABNORMAL LOW (ref 36.0–46.0)
Hemoglobin: 10.7 g/dL — ABNORMAL LOW (ref 12.0–15.0)
MCH: 23.5 pg — ABNORMAL LOW (ref 26.0–34.0)
MCHC: 31.5 g/dL (ref 30.0–36.0)

## 2012-08-07 MED ORDER — ENOXAPARIN SODIUM 150 MG/ML ~~LOC~~ SOLN
130.0000 mg | SUBCUTANEOUS | Status: DC
  Administered 2012-08-07 – 2012-08-09 (×3): 130 mg via SUBCUTANEOUS
  Filled 2012-08-07 (×3): qty 1

## 2012-08-07 MED ORDER — ENOXAPARIN SODIUM 150 MG/ML ~~LOC~~ SOLN: 130.0000 mg | SUBCUTANEOUS | Status: DC

## 2012-08-07 MED ORDER — WARFARIN SODIUM 4 MG PO TABS: 4.0000 mg | ORAL_TABLET | Freq: Once | ORAL | Status: DC

## 2012-08-07 MED ORDER — WARFARIN SODIUM 4 MG PO TABS
4.0000 mg | ORAL_TABLET | Freq: Once | ORAL | Status: AC
  Administered 2012-08-07: 4 mg via ORAL
  Filled 2012-08-07 (×2): qty 1

## 2012-08-07 NOTE — Discharge Summary (Addendum)
Physician Discharge Summary  Patient ID: Jenny Brady MRN: [id number] DOB/AGE: 77/13/1927 77 y.o.  Admit date: 07/21/2012 Discharge date: 08/13/2012  Primary Care Physician:  [name], MD  Discharge Diagnoses:    . Acute ischemic left MCA stroke . DEMENTIA-mod-severe . Coronary artery disease . acute on chronic Systolic CHF . Hyperglycemia without ketosis . UTI (lower urinary tract infection) . CAP (community acquired pneumonia)- left lung . Hyperthyroidism-s/p RAI admin- now hypothyroidism  . Dementia with behavioral disturbance . Dysphagia, unspecified- PEG tube placed, on Jevity 1.2 CAL at 60 cc/hour     Intracardiac thrombus  Consults: Neurology, Dr. Pearlean Brownie                   Interventional radiology                    [facility] for TEE                   Speech therapy                 Recommendations for Outpatient Follow-up:  1) please adjust Coumadin dose according to PT/INR, goal 2-3  2) titrate lisinopril and Lasix as BP tolerates, not on beta blockers secondary to hypotension   3) palliative care consult at facility   Discharge Medications:   Medication List    STOP taking these medications       ALPRAZolam 0.5 MG tablet  Commonly known as:  XANAX     aspirin EC 81 MG tablet     carvedilol 6.25 MG tablet  Commonly known as:  COREG     potassium chloride SA 20 MEQ tablet  Commonly known as:  K-DUR,KLOR-CON      TAKE these medications       albuterol (5 MG/ML) 0.5% nebulizer solution  Commonly known as:  PROVENTIL  Take 0.5 mLs (2.5 mg total) by nebulization every 4 (four) hours as needed for wheezing or shortness of breath.     cholecalciferol 1000 UNITS tablet  Commonly known as:  VITAMIN D  Place 1 tablet (1,000 Units total) into feeding tube daily.     enoxaparin 150 MG/ML injection  Commonly known as:  LOVENOX  Inject 0.87 mLs (130 mg total) into the skin daily.     feeding supplement (JEVITY 1.2 CAL) Liqd  Place  1,000 mLs into feeding tube continuous. At 81ml/hr     free water Soln  Place 150 mLs into feeding tube every 8 (eight) hours.     furosemide 20 MG tablet  Commonly known as:  LASIX  Place 0.5 tablets (10 mg total) into feeding tube daily.     levothyroxine 75 MCG tablet  Commonly known as:  SYNTHROID, LEVOTHROID  Place 1 tablet (75 mcg total) into feeding tube daily before breakfast.     lisinopril 2.5 MG tablet  Commonly known as:  ZESTRIL  Place 1 tablet (2.5 mg total) into feeding tube daily.     memantine 5 MG tablet  Commonly known as:  NAMENDA  Take 1 tablet (5 mg total) by mouth 2 (two) times daily.     pantoprazole sodium 40 mg/20 mL Pack  Commonly known as:  PROTONIX  Place 20 mLs (40 mg total) into feeding tube at bedtime.     simvastatin 20 MG tablet  Commonly known as:  ZOCOR  Place 1 tablet (20 mg total) into feeding tube daily at 6 PM.     VITAMIN B-12  PO  Take 1 tablet by mouth daily.     warfarin 4 MG tablet  Commonly known as:  COUMADIN  Take 1 tablet (4 mg total) by mouth one time only at 6 PM.     warfarin 3 MG tablet  Commonly known as:  COUMADIN  Take 1 tablet (3 mg total) by mouth one time only at 6 PM.         Brief H and P: For complete details please refer to admission H and P, but in brief Jenny Brady is a 79 y.o. female has a past medical history of Hyperthyroidism; Coronary artery disease; and GERD (gastroesophageal reflux disease). Patient was brought to the ED poorly responsive, respiratory distress and wheezing. Her H&P, her son was at the site was intoxicated and unable to provide any history. EMS stated that the house conditions were poor with evidence of hoarding. She was brought to [facility] long on emergency department and was noted to have pronounced right facial droop and right hemiparesis. A stat CT scan was ordered that did show acute nonhemorrhagic left MCA stroke. Neurology was consulted and patient was transferred to [facility]. Patient was not considered a candidate for TPA given unclear duration of her symptoms. Patient was aphasic. The workup was significant for UA suggestive of mild UTI and chest x-ray suggestive of mild pulmonary edema and in atelectasis versus infiltrate   Hospital Course:  Please H& and P for futher details. 79 yo female w/hx pf CAD, dementia came in on 4/13 for resp distress & R facial droop/hemiparesis. Pt's workup found to have large left MCA CVA secondary to possible intracardiac thrombus.   Acute ischemic large left MCA stroke:  - CT head showed large left MCA distribution infarct, most likely embolic. Not a candidate for thrombolytics due to unknown time of onset, delayed presentation.  - MRI of the brain showed large left hemispheric middle cerebral artery distribution infarct involving left temporal lobe, left frontal lobe and left opercular/sub insular region, Thrombus within the left middle cerebral artery suspected with occluded branches distal to this region.  - 2-D echo showed EF of 15%, severe tricuspid regurgitation, moderate PHTN, PAP 62, moderate MR  - carotid Dopplers showed no ICA stenosis, antegrade vertebral flow  - TEE on 4/18 showed EF of 20-25%, diffuse hypokinesis, no PFO, small mobile structure attached to the anterior wall of the left ventricle cannot rule out thrombus. Dr Rito Ehrlich discussed with cardiology and Dr Shirlee Latch felt that the patient does have intracardiac thrombus.  - Started on Coumadin per stroke service recommendations for intracardiac thrombus after PEG tube was placed and patient was tolerating tube feeds. Patient has remained on heparin drip, change to lovenox on 4.30.214.  INR was therapeutic on D/c - continue statin - Patient has very poor prognosis. - Palliative to meet with family at SNF  Dysphagia:   - from #1 Patient failed multiple swallow evaluations. She was placed on dysphagia 1 diet on 07/29/12 but her oral intake remained poor and  inconsistent. Interventional radiology was consulted and patient underwent PEG tube placement on 08/02/2012 - tube feeds started, so far tolerating Jevity 1.2 CAL at 60 cc/ hour via G tube  - Continue free water 150 cc q8hrs - Continue dysphagia 1 diet with pudding thick liquids as tolerated,outpatient speech therapy, swallow evaluations to upgrade diet as tolerated at the facility  DEMENTIA-mod-severe:  - alert and awake to person, follows some commands.   Hyperthyroidism-s/p RAI admin:  -  Now hypothyroidism, TSH 13.9  - Continue Synthroid increased dose to daily. Follow TSH in 4-6 weeks.   Coronary artery disease with acute on chronic systolic CHF (EF was 25-30% in 2012) : - TEE showed EF of 20-25%, diffuse hypokinesis  - Mild fluid overload, placed on oral Lasix as tolerated - Chest x-ray showed residual left lower lobe airspace opacity and improving pulmonary edema pattern  - Patient would need to be on Coreg once BP more stable. Placed her on low-dose lisinopril for cardiomyopathy. Titrate as tolerated.    Per patient's son, Jenny Brady  Goals of care  1) FULL CODE STATUS and continue aggressive care "to keep her alive and going"  2) GTube  3) skilled nursing placement  He declined palliative care consult during hospitalization  Day of Discharge BP 106/62  Pulse 86  Temp(Src) 99.3 F (37.4 C) (Axillary)  Resp 16  Ht 5\' 6"  (1.676 m)  Wt 80.4 kg (177 lb 4 oz)  BMI 28.62 kg/m2  SpO2 96%  Physical Exam:  General: alert and awake , drooling CVS: S1-S2 clear  Chest: Decreased breath sound at the bases  Abdomen: obese, soft, nontender, G-tube+  Extremities: no c/c/e  Neuro: right hemiparesis   The results of significant diagnostics from this hospitalization (including imaging, microbiology, ancillary and laboratory) are listed below for reference.    LAB RESULTS: Basic Metabolic Panel:  Recent Labs Lab 08/10/12 0510  NA 140  K 4.2  CL 102  CO2 28   GLUCOSE 135*  BUN 15  CREATININE 0.57  CALCIUM 9.1   CBC:  Recent Labs Lab 08/12/12 0625 08/13/12 0600  WBC 4.8 4.7  HGB 10.9* 10.8*  HCT 34.3* 33.4*  MCV 74.1* 73.9*  PLT 261 260  CBG:  Recent Labs Lab 08/12/12 1636 08/12/12 2200  GLUCAP 136* 149*    Significant Diagnostic Studies:   CT of the brain 07/21/2012 1. Large nonhemorrhagic left middle cerebral artery infarct with early edema without midline shift. 2. Old left occipital infarct. 3. Atrophy, most prominent in the temporal lobes.   MRI of the brain 07/22/2012 Motion degraded exam. Large left middle cerebral artery distribution infarct as noted above. Thrombus within the left middle cerebral artery suspected with occluded branches distal to this region.   MRA of the brain 1. Left MCA occluded or nearly occluded in the M1 segment just beyond its origin. This corresponds to the earlier MRI findings. Faint more distal left MCA flow signal; little to no M2 segment  flow signal. 2. Moderately degraded by motion despite repeated imaging  attempts.   2D Echocardiogram EF 15% with no source of embolus.   Carotid Doppler No evidence of hemodynamically significant internal carotid artery stenosis. Vertebral artery flow is antegrade.   TEE 07/26/2012 - ejection fraction 20-25%. There was a small mobile structure attached to the anterior wall of the LV. This may have been a loose trabeculation but cannot rule out thrombus given history."Would consider anticoagulation if she would be a safe candidate"  TCD Normal mean flow velocities in right middle cerebral, basilar, right vertebral, bilateral carotid siphons and opthalmic arteries. Suboptimal windows throghout limits exam.   CXR  07/22/2012 1. Persistent pulmonary edema. 2. No change in aeration to the left lower lobe.  07/21/2012 1. Cardiomegaly and pulmonary edema. 2. Small left pleural effusion and basilar airspace disease which could be due to atelectasis or pneumonia.    EKG normal sinus rhythm, LBBB.    Disposition and Follow-up: Discharge Orders  Future Orders Complete By Expires     Discharge instructions  As directed     Comments:      Discharge diet: Dysphagia 1, pudding thick liquids although patient's oral intake has been poor. Currently on Jevity 1.2 cal at 60cc/hr.        DISPOSITION: SNF   DIET: Discharge diet: Dysphagia 1, pudding thick liquids although patient's oral intake has been poor. Currently on Jevity 1.2 cal at 60cc/hr.  ACTIVITY: As tolerated  TESTS THAT NEED FOLLOW-UP TSH IN 4-6 WEEKS  DISCHARGE FOLLOW-UP Follow-up Information   Follow up with ROBERTS, [name], MD. Schedule an appointment as soon as possible for a visit in 10 days.   Contact information:   [address] [place] [address] [phone]       Follow up with Gates Rigg, MD. Schedule an appointment as soon as possible for a visit in 2 months. (for stroke follow-up)    Contact information:   [address] [place] [address] [phone]       Time spent on Discharge: 65 minutes  Signed:   Marinda Elk M.D. [facility] 08/13/2012, 7:46 AM Pager: [phone]

## 2012-08-07 NOTE — Progress Notes (Signed)
Speech Language Pathology Treatment Patient Details Name: Jenny Brady MRN: [id number] DOB: Dec 31, 1925 Today's Date: 08/07/2012 Time: 4098-1191 SLP Time Calculation (min): 25 min  Assessment / Plan / Recommendation Clinical Impression  F/u for aphasia tx.  Pt responding to questions with head shake and vegetative voicing; does not differentiate yes/no responses.  Pt requires visual model/total assist to follow functional commands for holding items, handing them to clinician, and using them (wash cloth, toothpaste).  Max assist to hold/release on command.  Total assist with tactile prompting for phonatory imitation.  Pt for potential d/c to SNF tomorrow.  Will need continued f/u for aphasia and dysphagia.     SLP Plan  Continue with current plan of care    Pertinent Vitals/Pain No indication of pain  SLP Goals  SLP Goals Potential to Achieve Goals: Fair SLP Goal #1: Pt will follow one step basic commands within functional activity with max verbal/tactile/visual cues and 75% accuracy.  SLP Goal #1 - Progress: Progressing toward goal SLP Goal #2: Pt will answer basic biographical/environmental  y/n questions with max verbal/tactile/visual cues and 75% accuracy.   SLP Goal #2 - Progress: Progressing toward goal SLP Goal #3: Pt will approximate articulation of bilabial sounds with total verbal/visual/tactile assist and 75% accuracy.   SLP Goal #3 - Progress: Progressing toward goal  General Temperature Spikes Noted: No  Oral Cavity - Oral Hygiene Does patient have any of the following "at risk" factors?: Nutritional status - dependent feeder Patient is HIGH RISK - Oral Care Protocol followed (see row info): Yes   Treatment Treatment focused on: Aphasia Skilled Treatment: language facilitation; command following with scaffolding.   GO     Jenny Brady Laurice 08/07/2012, 3:35 PM

## 2012-08-07 NOTE — Progress Notes (Signed)
Physical Therapy Treatment Patient Details Name: Jenny Brady MRN: [id number] DOB: 1926-01-28 Today's Date: 08/07/2012 Time: 1410-1440 PT Time Calculation (min): 30 min  PT Assessment / Plan / Recommendation Comments on Treatment Session  Adm. s/p L MCA CVA. More alert and responsive to her name today during ut session. Still very limited by cognition.     Follow Up Recommendations  SNF     Does the patient have the potential to tolerate intense rehabilitation     Barriers to Discharge        Equipment Recommendations       Recommendations for Other Services    Frequency Min 3X/week   Plan Discharge plan remains appropriate;Frequency remains appropriate    Precautions / Restrictions Precautions Precautions: Fall Precaution Comments: right neglect, vision deficits,  RUE 0/5   Pertinent Vitals/Pain Doesn't appear in pain although does moan with mobility, appears to be because of fear and lack of understanding    Mobility  Bed Mobility Bed Mobility: Supine to Sit;Sitting - Scoot to Edge of Bed Supine to Sit: 1: +2 Total assist Supine to Sit: Patient Percentage: 10% Sitting - Scoot to Edge of Bed: 2: Max assist Details for Bed Mobility Assistance: pt pulling on rail today, likely out of fear but able to pull her trunk slightly towards the rail; assist to release her grip on the rail to achieve tall sitting; use of pad to scoot hips; patient able to scoot her hips backward on the left without assist Transfers Transfers: Stand Pivot Transfers Sit to Stand: 1: +2 Total assist Sit to Stand: Patient Percentage: 20% Details for Transfer Assistance: 2 attempts prior to 3rd successful stand pivot transfer to the chair, needing gentle rocking in prep for transfer to engage automatic/implicit knowledge; hand over hand cues and visual cues for sequencing Ambulation/Gait Ambulation/Gait Assistance: Not tested (comment)    Exercises General Exercises - Lower Extremity Ankle  Circles/Pumps: AAROM;Both;10 reps;Seated    PT Goals Acute Rehab PT Goals PT Goal: Supine/Side to Sit - Progress: Progressing toward goal PT Goal: Sit at Edge Of Bed - Progress: Progressing toward goal PT Goal: Sit to Stand - Progress: Progressing toward goal PT Goal: Stand to Sit - Progress: Progressing toward goal PT Transfer Goal: Bed to Chair/Chair to Bed - Progress: Progressing toward goal Additional Goals PT Goal: Additional Goal #1 - Progress: Progressing toward goal  Visit Information  Last PT Received On: 08/07/12 Assistance Needed: +2    Subjective Data  Subjective: lifts her head to her name   Cognition  Cognition Arousal/Alertness: Awake/alert Behavior During Therapy: Flat affect Area of Impairment: Attention;Following commands;Awareness Orientation Level: Disoriented to;Person;Place;Time;Situation Current Attention Level: Focused Following Commands: Follows one step commands inconsistently Awareness: Intellectual;Emergent;Anticipatory 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 Sitting Balance Static Sitting - Balance Support: Feet supported Static Sitting - Comment/# of Minutes: again sat EOB 5 minutes, good sitting balance today needing less physical assist; holds RUE relatively rigid but will spontaneously use it functionally to wipe her face without LOB; encouragement to leave her RUE in her lap; attempts to engage her in functional/reaching activities in supported sitting (in recliner) with little success  End of Session PT - End of Session Equipment Utilized During Treatment: Gait belt Activity Tolerance: Patient tolerated treatment well Patient left: in chair;with call bell/phone within reach (lift pad underneath her) Nurse Communication: Mobility status   GP     [facility] HELEN 08/07/2012, 3:12 PM

## 2012-08-07 NOTE — Progress Notes (Addendum)
ANTICOAGULATION CONSULT NOTE - Follow Up Consult  Pharmacy Consult for Heparin and Coumadin Indication: stroke and possible LV thrombus per TEE  Allergies  Allergen Reactions  . Hyoscyamine   . Ivp Dye (Iodinated Diagnostic Agents)   . Septra (Sulfamethoxazole W-Trimethoprim)     Patient Measurements: Height: 5\' 6"  (167.6 cm) Weight: 188 lb 7.9 oz (85.5 kg) IBW/kg (Calculated) : 59.3 Heparin Dosing Weight: 77 kg  Vital Signs: Temp: 98.9 F (37.2 C) (04/30 0600) Temp src: Axillary (04/30 0600) BP: 114/57 mmHg (04/30 0600) Pulse Rate: 90 (04/30 0600)  Labs:  Recent Labs  08/04/12 0859  08/05/12 0650 08/06/12 0610 08/07/12 0555  HGB  --   < > 10.4* 10.7* 10.7*  HCT  --   --  32.7* 34.1* 34.0*  PLT  --   --  216 232 235  LABPROT  --   --  14.5 15.6* 20.3*  INR  --   --  1.15 1.27 1.81*  HEPARINUNFRC  --   --  0.32 0.27* 0.61  CREATININE 0.58  --   --   --   --   < > = values in this interval not displayed.  Estimated Creatinine Clearance: 57.7 ml/min (by C-G formula based on Cr of 0.58).  Assessment: 4 yof continues on IV heparin + coumadin s/p CVA and possible LV thrombus. Heparin level is now slightly above goal at 0.61. No bleeding noted, CBC is low but stable. INR also jumped up to 1.81.   Goal of Therapy:  INR 2-3 Heparin level 0.3-0.5 units/ml Monitor platelets by anticoagulation protocol: Yes   Plan:  1. Decrease heparin gtt 1200 units/hr 2. Check an 8 hour heparin level 3. If still here, give coumadin 4mg  PO x 1 tonight 4. F/u AM INR, heparin level and CBC if still here  Lysle Pearl, PharmD, BCPS Pager # [phone] 08/07/2012 8:26 AM   Addendum: Now changing heparin to lovenox once daily.   Plan: 1. DC heparin drip 2. Administer lovenox 130mg  (1.46m/kg/day) SQ Q24H starting 1 hour after heparin gtt turned off  Lysle Pearl, PharmD, BCPS Pager # [phone] 08/07/2012 9:58 AM

## 2012-08-07 NOTE — Clinical Social Work Note (Signed)
Clinical Social Work   CSW is continuing to follow pt to facilitate the discharge to SNF. Pt's son's first choice does not accept pt's insurance, so pt's son's second choice is [facility]. Pt's son would like for pt to be transferred to [facility] first thing in the morning. CSW tried to explain that pt was ready for discharge today and needed to transfer to SNF. Pt's son was addiment for a discharge in the morning, and pt's son then promptly disconnected from the phone conversation. CSW then contacted [facility] regarding bed availability. CSW forwarded updated clinicals to facility and is waiting for final bed offer. CSW will continue to follow.   Dede Query, MSW, LCSW [phone]

## 2012-08-07 NOTE — Progress Notes (Signed)
Patients son was very anxious and frustrated with her discharge process. Son want to talk to doctor and social worker personally one to one, not via phone before pt get discharge. He said " I don't want to her to get discharge until I talk to the doctor". Also he requested for information regarding 'Patient's Rights' on hospital rules and regulations. Kept those documents in patients room since he left for the day.

## 2012-08-08 LAB — CBC
HCT: 32.5 % — ABNORMAL LOW (ref 36.0–46.0)
Hemoglobin: 10.3 g/dL — ABNORMAL LOW (ref 12.0–15.0)
MCHC: 31.7 g/dL (ref 30.0–36.0)
MCV: 74.4 fL — ABNORMAL LOW (ref 78.0–100.0)
RDW: 18 % — ABNORMAL HIGH (ref 11.5–15.5)
WBC: 4.4 10*3/uL (ref 4.0–10.5)

## 2012-08-08 LAB — GLUCOSE, CAPILLARY
Glucose-Capillary: 131 mg/dL — ABNORMAL HIGH (ref 70–99)
Glucose-Capillary: 121 mg/dL — ABNORMAL HIGH (ref 70–99)

## 2012-08-08 LAB — PROTIME-INR
INR: 2.13 — ABNORMAL HIGH (ref 0.00–1.49)
Prothrombin Time: 22.9 seconds — ABNORMAL HIGH (ref 11.6–15.2)

## 2012-08-08 MED ORDER — WARFARIN SODIUM 5 MG PO TABS
5.0000 mg | ORAL_TABLET | Freq: Once | ORAL | Status: AC
  Administered 2012-08-08: 5 mg via ORAL
  Filled 2012-08-08: qty 1

## 2012-08-08 NOTE — Clinical Social Work Note (Signed)
Clinical Social Work   CSW spoke with pt's son to address discharge plan. Pt's son declined [facility] and preferred [facility]. Pt's son choice facility is not an option as pt's insurance is not in network. Chiropodist of Clinical Social Work is assisting treatment team in faciltating discharge of pt. CSW is available to facilitate discharge to SNF.   Dede Query, MSW, LCSW [phone]

## 2012-08-08 NOTE — Progress Notes (Signed)
[facility] PROGRESS NOTE  Assessment/Plan:  Acute ischemic large left MCA stroke:  - TEE on 4/18 showed EF of 20-25%, diffuse hypokinesis, no PFO, small mobile structure attached to the anterior wall of the left ventricle cannot rule out thrombus. Dr Rito Ehrlich discussed with cardiology and Dr Shirlee Latch felt that the patient does have intracardiac thrombus.  - Started on Coumadin  - continue statin  - Patient has very poor prognosis.  - Palliative to meet with family at SNF  - pt sons keep trying to expedite pateint transfer to facility. - being disrespectfull at staff.  Dysphagia:  - underwent PEG tube placement on 08/02/2012  - tube feeds started, so far tolerating Jevity 1.2 CAL at 60 cc/ hour via G tube  - Continue free water 150 cc q8hrs  - Continue dysphagia 1 diet with pudding thick liquids as tolerated,outpatient speech therapy, swallow evaluations to upgrade diet as tolerated at the facility    Code Status: full Family Communication: son  Disposition Plan: SNF today   Consultants:  Neuro  Procedures:  MRI  Carotid  ECHO   Antibiotics:  None  HPI/Subjective: lethargic  Objective: Filed Vitals:   08/07/12 2145 08/08/12 0148 08/08/12 0451 08/08/12 0601  BP: 110/61 104/50 115/62   Pulse: 87 83 82   Temp: 99.1 F (37.3 C) 98.4 F (36.9 C) 98.3 F (36.8 C)   TempSrc: Axillary Axillary Axillary   Resp: 18 16 18    Height:      Weight:    81 kg (178 lb 9.2 oz)  SpO2: 99% 99% 100%     Intake/Output Summary (Last 24 hours) at 08/08/12 1019 Last data filed at 08/08/12 0827  Gross per 24 hour  Intake     10 ml  Output   1650 ml  Net  -1640 ml   Filed Weights   08/02/12 0500 08/04/12 0500 08/08/12 0601  Weight: 85.1 kg (187 lb 9.8 oz) 85.5 kg (188 lb 7.9 oz) 81 kg (178 lb 9.2 oz)    Exam:  General: , in no acute distress.  HEENT: No bruits, no goiter. drooling Heart: Regular rate and rhythm, without murmurs, rubs, gallops.  Neuro: Grossly  intact, nonfocal.   Data Reviewed: Basic Metabolic Panel:  Recent Labs Lab 08/04/12 0859  NA 137  K 4.1  CL 101  CO2 29  GLUCOSE 142*  BUN 15  CREATININE 0.58  CALCIUM 8.8   Liver Function Tests: No results found for this basename: AST, ALT, ALKPHOS, BILITOT, PROT, ALBUMIN,  in the last 168 hours No results found for this basename: LIPASE, AMYLASE,  in the last 168 hours No results found for this basename: AMMONIA,  in the last 168 hours CBC:  Recent Labs Lab 08/04/12 0625 08/05/12 0650 08/06/12 0610 08/07/12 0555 08/08/12 0435  WBC 5.7 4.3 4.6 5.0 4.4  HGB 10.7* 10.4* 10.7* 10.7* 10.3*  HCT 33.2* 32.7* 34.1* 34.0* 32.5*  MCV 73.5* 74.0* 75.1* 74.7* 74.4*  PLT 211 216 232 235 228   Cardiac Enzymes: No results found for this basename: CKTOTAL, CKMB, CKMBINDEX, TROPONINI,  in the last 168 hours BNP (last 3 results)  Recent Labs  07/21/12 1635  PROBNP 9584.0*   CBG:  Recent Labs Lab 08/07/12 1655 08/07/12 2147 08/08/12 0031 08/08/12 0509 08/08/12 0646  GLUCAP 145* 138* 107* 131* 121*    No results found for this or any previous visit (from the past 240 hour(s)).   Studies: No results found.  Scheduled Meds: . enoxaparin (LOVENOX)  injection  130 mg Subcutaneous Q24H  . free water  150 mL Per Tube Q8H  . furosemide  10 mg Per Tube Daily  . levothyroxine  75 mcg Oral QAC breakfast  . pantoprazole sodium  40 mg Per Tube QHS  . simvastatin  20 mg Oral q1800  . Warfarin - Pharmacist Dosing Inpatient   Does not apply q1800   Continuous Infusions: . feeding supplement (JEVITY 1.2 CAL) 1,000 mL (08/08/12 0346)     Marinda Elk  [facility] Pager [phone]. If 8PM-8AM, please contact night-coverage at [email or web address], password [facility] 08/08/2012, 10:19 AM  LOS: 18 days

## 2012-08-08 NOTE — Progress Notes (Signed)
This morning I spoke with the patient's son and informed him of the current skilled facility option which did not include the family's preference, [facility]. The son abruptly ended the phone conversation and cannot be reached.   I have spoken with with the treatment team about this situation. We are looking at our options in terms of medical decision making if the son does not respond to his mother's healthcare needs.  Gretta Cool, LCSW Assistant Director Clinical Social Work [phone]

## 2012-08-08 NOTE — Progress Notes (Signed)
ANTICOAGULATION CONSULT NOTE - Follow Up Consult  Pharmacy Consult for Lovenox and Coumadin Indication: stroke and possible LV thrombus per TEE (possible LV thrombus)  Allergies  Allergen Reactions  . Hyoscyamine   . Ivp Dye (Iodinated Diagnostic Agents)   . Septra (Sulfamethoxazole W-Trimethoprim)     Patient Measurements: Height: 5\' 6"  (167.6 cm) Weight: 178 lb 9.2 oz (81 kg) IBW/kg (Calculated) : 59.3  Vital Signs: Temp: 98.8 F (37.1 C) (05/01 1410) Temp src: Oral (05/01 1410) BP: 127/72 mmHg (05/01 1410) Pulse Rate: 105 (05/01 1410)  Labs:  Recent Labs  08/06/12 0610 08/07/12 0555 08/08/12 0435  HGB 10.7* 10.7* 10.3*  HCT 34.1* 34.0* 32.5*  PLT 232 235 228  LABPROT 15.6* 20.3* 22.9*  INR 1.27 1.81* 2.13*  HEPARINUNFRC 0.27* 0.61  --     Estimated Creatinine Clearance: 56.2 ml/min (by C-G formula based on Cr of 0.58).  Assessment:   Overlap day # 6 Heparin->Lovenox and Coumadin. First day with therapeutic INR.   INR had been slow to increase. Has had Coumadin 4 mg daily x 2, then 6 mg daily x 2, then 4 mg.  Goal of Therapy:  INR 2-3 Heparin level 0.3-0.5 units/ml Monitor platelets by anticoagulation protocol: Yes   Plan:   Continue Lovenox 130 mg SQ q24hrs.  Coumadin 5 mg PO or per tube today.  PT/INR in am.    Expect Lovenox can stop on 5/2 if INR remains >2.  For transfer to SNF when bed available.  Dennie Fetters, [place] Pager: [phone] 08/08/2012,3:35 PM

## 2012-08-09 LAB — CBC
HCT: 33.3 % — ABNORMAL LOW (ref 36.0–46.0)
MCH: 23.8 pg — ABNORMAL LOW (ref 26.0–34.0)
MCV: 74.7 fL — ABNORMAL LOW (ref 78.0–100.0)
Platelets: 225 10*3/uL (ref 150–400)
RBC: 4.46 MIL/uL (ref 3.87–5.11)
RDW: 17.9 % — ABNORMAL HIGH (ref 11.5–15.5)
WBC: 5.2 10*3/uL (ref 4.0–10.5)

## 2012-08-09 LAB — GLUCOSE, CAPILLARY
Glucose-Capillary: 119 mg/dL — ABNORMAL HIGH (ref 70–99)
Glucose-Capillary: 150 mg/dL — ABNORMAL HIGH (ref 70–99)

## 2012-08-09 MED ORDER — WARFARIN SODIUM 5 MG PO TABS
5.0000 mg | ORAL_TABLET | Freq: Every day | ORAL | Status: DC
  Administered 2012-08-09 – 2012-08-10 (×2): 5 mg
  Filled 2012-08-09 (×3): qty 1

## 2012-08-09 NOTE — Progress Notes (Signed)
Physical Therapy Treatment Patient Details Name: Jenny Brady MRN: [id number] DOB: 12-08-1925 Today's Date: 08/09/2012 Time: 4098-1191 PT Time Calculation (min): 46 min  PT Assessment / Plan / Recommendation Comments on Treatment Session  Adm. s/p L MCA CVA. Slow progress. Gaurded prognosis for therapy/      Follow Up Recommendations  SNF     Does the patient have the potential to tolerate intense rehabilitation     Barriers to Discharge        Equipment Recommendations       Recommendations for Other Services    Frequency Min 3X/week   Plan Discharge plan remains appropriate;Frequency remains appropriate    Precautions / Restrictions Precautions Precautions: Fall Precaution Comments: right neglect, vision deficits,  RUE 0/5 Restrictions Weight Bearing Restrictions: No   Pertinent Vitals/Pain Doesn't appear in pain    Mobility  Bed Mobility Rolling Right: 1: +2 Total assist Rolling Right: Patient Percentage: 10% Rolling Left: 1: +2 Total assist Rolling Left: Patient Percentage: 0% Supine to Sit: 1: +2 Total assist Supine to Sit: Patient Percentage: 0% Details for Bed Mobility Assistance: rolling to encourage pt awareness of surroundings especially on the right as well as for pericare, pt still moans with all mobility, with max facilitation we were able to get her to reach for the rail when rolling to the right; resistant to sitting up and moaning like due to fear Transfers Transfers: Stand Pivot Transfers;Sit to Stand;Stand to Sit Sit to Stand: 1: +2 Total assist Sit to Stand: Patient Percentage: 20% Stand to Sit: 1: +2 Total assist Stand to Sit: Patient Percentage: 20% Stand Pivot Transfers: 1: +2 Total assist Stand Pivot Transfers: Patient Percentage: 30% Details for Transfer Assistance: sit<>stand x1 with lateral weight shift (30 seconds), pt extending through LLE needing block for right knee to prevent buckling and facilitation through upper trunk for  extension; initiation steps with left leg during transfer      PT Goals Acute Rehab PT Goals PT Goal: Rolling Supine to Right Side - Progress: Progressing toward goal PT Goal: Rolling Supine to Left Side - Progress: Progressing toward goal PT Goal: Supine/Side to Sit - Progress: Progressing toward goal PT Goal: Sit at Edge Of Bed - Progress: Progressing toward goal PT Goal: Sit to Stand - Progress: Progressing toward goal PT Goal: Stand to Sit - Progress: Progressing toward goal PT Transfer Goal: Bed to Chair/Chair to Bed - Progress: Progressing toward goal  Visit Information  Last PT Received On: 08/09/12 Assistance Needed: +2    Subjective Data  Subjective: opens her eyes when name called, difficulty focusing on the person   Cognition  Cognition Arousal/Alertness: Awake/alert Behavior During Therapy: Flat affect Area of Impairment: Attention;Following commands;Awareness Orientation Level: Disoriented to;Person;Place;Time;Situation Current Attention Level: Focused Awareness: Intellectual;Emergent;Anticipatory Difficult to assess due to: Impaired communication    Balance  Balance Balance Assessed: Yes Static Sitting Balance Static Sitting - Balance Support: Right upper extremity supported;No upper extremity supported Static Sitting - Comment/# of Minutes: weight shifting in sitting needing max facilitation for particiaption, moderate left preference today, supported weight bearing through right upper extremity/forearm; facilitation for tall posture, trunk/head/neck control Dynamic Standing Balance Dynamic Standing - Balance Support: No upper extremity supported Dynamic Standing - Level of Assistance: 1: +2 Total assist (10-15%) Dynamic Standing - Balance Activities: Lateral lean/weight shifting Dynamic Standing - Comments: max facilitation bilaterally for weight shift  End of Session PT - End of Session Equipment Utilized During Treatment: Gait belt Activity Tolerance:  Patient tolerated treatment  well;Patient limited by fatigue Patient left: in chair;with call bell/phone within reach Nurse Communication: Mobility status   GP     [facility] HELEN 08/09/2012, 12:21 PM

## 2012-08-09 NOTE — Progress Notes (Signed)
ANTICOAGULATION CONSULT NOTE - Follow Up Consult  Pharmacy Consult for Lovenox and Coumadin Indication: stroke and possible LV thrombus per TEE (possible LV thrombus)  Allergies  Allergen Reactions  . Hyoscyamine   . Ivp Dye (Iodinated Diagnostic Agents)   . Septra (Sulfamethoxazole W-Trimethoprim)     Patient Measurements: Height: 5\' 6"  (167.6 cm) Weight: 177 lb 14.6 oz (80.7 kg) IBW/kg (Calculated) : 59.3  Vital Signs: Temp: 98.7 F (37.1 C) (05/02 1050) Temp src: Oral (05/02 1050) BP: 97/51 mmHg (05/02 1050) Pulse Rate: 86 (05/02 1050)  Labs:  Recent Labs  08/07/12 0555 08/08/12 0435 08/09/12 0555  HGB 10.7* 10.3* 10.6*  HCT 34.0* 32.5* 33.3*  PLT 235 228 225  LABPROT 20.3* 22.9* 24.2*  INR 1.81* 2.13* 2.29*  HEPARINUNFRC 0.61  --   --     Estimated Creatinine Clearance: 56.1 ml/min (by C-G formula based on Cr of 0.58).  Assessment:   Overlap day # 7 Heparin->Lovenox and Coumadin. Second day with therapeutic INR.  Lovenox given this am, now dc'd.   INR had been slow to increase. Has had Coumadin 4 mg daily x 2, then 6 mg daily x 2, then 4 mg, then 5 mg.  Goal of Therapy:  INR 2-3 Monitor platelets by anticoagulation protocol: Yes   Plan:   Lovenox discontinued.  Coumadin 5 mg per tube today.  PT/INR and bmet in am.    For transfer to SNF when bed available.  Dennie Fetters, [place] Pager: [phone] 08/09/2012,3:46 PM

## 2012-08-09 NOTE — Progress Notes (Signed)
Sediment noted in urine collection bag. On call MD notified. No new orders at this time. Will continue to monitor.

## 2012-08-09 NOTE — Progress Notes (Signed)
[facility] PROGRESS NOTE  Assessment/Plan:  Acute ischemic large left MCA stroke:  - On Coumadin  - continue statin  - Patient has very poor prognosis.  - Palliative to meet with family at SNF  - Pt sons keep trying to avoid pateint transfer to facility. As this is not the facility he wanted. - being disrespectfull at staff. Insulting and being aggressive. I try to talk to the the son and he left the room. - Awaiting placement.  Dysphagia:  - underwent PEG tube placement on 08/02/2012  - tube feeds started, so far tolerating Jevity 1.2 CAL at 60 cc/ hour via G tube  - Continue free water 150 cc q8hrs  - Continue dysphagia 1 diet with pudding thick liquids as tolerated,outpatient speech therapy, swallow evaluations to upgrade diet as tolerated at the facility    Code Status: full Family Communication: son  Disposition Plan: SNF today   Consultants:  Neuro  Procedures:  MRI  Carotid  ECHO   Antibiotics:  None  HPI/Subjective: lethargic  Objective: Filed Vitals:   08/08/12 2256 08/09/12 0259 08/09/12 0500 08/09/12 0650  BP: 117/66 112/59  109/58  Pulse: 87 82    Temp: 98.8 F (37.1 C) 98.8 F (37.1 C)  98.2 F (36.8 C)  TempSrc: Axillary Axillary  Axillary  Resp: 18 18    Height:      Weight:   80.7 kg (177 lb 14.6 oz)   SpO2: 96% 96%  95%    Intake/Output Summary (Last 24 hours) at 08/09/12 0831 Last data filed at 08/08/12 1900  Gross per 24 hour  Intake    730 ml  Output    751 ml  Net    -21 ml   Filed Weights   08/04/12 0500 08/08/12 0601 08/09/12 0500  Weight: 85.5 kg (188 lb 7.9 oz) 81 kg (178 lb 9.2 oz) 80.7 kg (177 lb 14.6 oz)    Exam:  General: , in no acute distress.  HEENT: No bruits, no goiter. drooling Heart: Regular rate and rhythm, without murmurs, rubs, gallops.  Neuro: Grossly intact, nonfocal.   Data Reviewed: Basic Metabolic Panel:  Recent Labs Lab 08/04/12 0859  NA 137  K 4.1  CL 101  CO2 29  GLUCOSE  142*  BUN 15  CREATININE 0.58  CALCIUM 8.8   Liver Function Tests: No results found for this basename: AST, ALT, ALKPHOS, BILITOT, PROT, ALBUMIN,  in the last 168 hours No results found for this basename: LIPASE, AMYLASE,  in the last 168 hours No results found for this basename: AMMONIA,  in the last 168 hours CBC:  Recent Labs Lab 08/05/12 0650 08/06/12 0610 08/07/12 0555 08/08/12 0435 08/09/12 0555  WBC 4.3 4.6 5.0 4.4 5.2  HGB 10.4* 10.7* 10.7* 10.3* 10.6*  HCT 32.7* 34.1* 34.0* 32.5* 33.3*  MCV 74.0* 75.1* 74.7* 74.4* 74.7*  PLT 216 232 235 228 225   Cardiac Enzymes: No results found for this basename: CKTOTAL, CKMB, CKMBINDEX, TROPONINI,  in the last 168 hours BNP (last 3 results)  Recent Labs  07/21/12 1635  PROBNP 9584.0*   CBG:  Recent Labs Lab 08/08/12 0646 08/08/12 1137 08/08/12 1650 08/08/12 2300 08/09/12 0646  GLUCAP 121* 147* 135* 119* 115*    No results found for this or any previous visit (from the past 240 hour(s)).   Studies: No results found.  Scheduled Meds: . enoxaparin (LOVENOX) injection  130 mg Subcutaneous Q24H  . free water  150 mL Per Tube Q8H  .  furosemide  10 mg Per Tube Daily  . levothyroxine  75 mcg Oral QAC breakfast  . pantoprazole sodium  40 mg Per Tube QHS  . simvastatin  20 mg Oral q1800  . Warfarin - Pharmacist Dosing Inpatient   Does not apply q1800   Continuous Infusions: . feeding supplement (JEVITY 1.2 CAL) 60 mL/hr at 08/09/12 0250     Marinda Elk  [facility] Pager [phone]. If 8PM-8AM, please contact night-coverage at [email or web address], password [facility] 08/09/2012, 8:31 AM  LOS: 19 days

## 2012-08-09 NOTE — Care Management Note (Signed)
    Page 1 of 1   08/14/2012     8:58:39 AM   CARE MANAGEMENT NOTE 08/14/2012  Patient:  Jenny Brady, Jenny Brady   Account Number:  [id number]  Date Initiated:  07/22/2012  Documentation initiated by:  Jacquelynn Cree  Subjective/Objective Assessment:   admitted with CVA     Action/Plan:   PT/OT/ST evals-recommended SNF   Anticipated DC Date:  08/04/2012   Anticipated DC Plan:  SKILLED NURSING FACILITY  In-house referral  Clinical Social Worker      DC Planning Services  CM consult      Choice offered to / List presented to:             Status of service:  Completed, signed off Medicare Important Message given?   (If response is "NO", the following Medicare IM given date fields will be blank) Date Medicare IM given:   Date Additional Medicare IM given:    Discharge Disposition:  SKILLED NURSING FACILITY  Per UR Regulation:  Reviewed for med. necessity/level of care/duration of stay  If discussed at Long Length of Stay Meetings, dates discussed:   07/30/2012  08/01/2012  08/08/2012  08/13/2012    Comments:  08/09/12 Left VM on son's phone number informing him that Important Message form will be left on patient's bedside table and gave him my phone #. Left IM on bedside table. Jacquelynn Cree RN, BSN, CCM   08/01/12 PEG to be placed 08/02/12.

## 2012-08-09 NOTE — Progress Notes (Signed)
Occupational Therapy Treatment Patient Details Name: Jenny Brady MRN: [id number] DOB: Aug 28, 1925 Today's Date: 08/09/2012 Time: 1478-2956 OT Time Calculation (min): 24 min  OT Assessment / Plan / Recommendation Comments on Treatment Session Pt more alert during tx session, but still demos cognitive defificts. Pt to continue with acute OT services to maximize level of function and safety    Follow Up Recommendations  SNF    Barriers to Discharge   pt lives at home alone?    Equipment Recommendations   TBD at SNF level of care   Recommendations for Other Services    Frequency Min 3X/week   Plan Discharge plan remains appropriate    Precautions / Restrictions Precautions Precautions: Fall Precaution Comments: right neglect, vision deficits,  RUE 0/5 Restrictions Weight Bearing Restrictions: No   Pertinent Vitals/Pain     ADL  Grooming: Maximal assistance;Performed;Wash/dry face;Brushing hair Where Assessed - Grooming: Supported sitting Upper Body Bathing: Maximal assistance;+1 Total assistance;Simulated Where Assessed - Upper Body Bathing: Supported sitting ADL Comments: continues with L gaze,R nelgect and required hand over hand to initiate and complete grooming and UB bathing (simulated) tasks seated EOB with mod - max A for support/balance    OT Diagnosis:    OT Problem List:   OT Treatment Interventions:     OT Goals ADL Goals ADL Goal: Grooming - Progress: Progressing toward goals ADL Goal: Upper Body Bathing - Progress: Progressing toward goals ADL Goal: Additional Goal #1 - Progress: Not progressing Arm Goals Arm Goal: PROM - Progress: Progressing toward goal  Visit Information  Last OT Received On: 08/09/12 Assistance Needed: +2    Subjective Data  Subjective: pt non verbal, moans and groans Patient Stated Goal: None stated   Prior Functioning       Cognition  Cognition Arousal/Alertness: Awake/alert Behavior During Therapy: Flat affect Area  of Impairment: Attention;Following commands;Awareness Orientation Level: Disoriented to;Person;Place;Time;Situation Current Attention Level: Focused Following Commands: Follows one step commands inconsistently Awareness: Intellectual;Emergent;Anticipatory Difficult to assess due to: Impaired communication    Mobility  Bed Mobility Bed Mobility: Supine to Sit;Sitting - Scoot to [place] of Bed Rolling Right: 1: +2 Total assist Rolling Right: Patient Percentage: 10% Rolling Left: 1: +2 Total assist Rolling Left: Patient Percentage: 0% Supine to Sit: 1: +2 Total assist Supine to Sit: Patient Percentage: 0% Sitting - Scoot to Edge of Bed: 2: Max assist Sit to Supine: 1: +2 Total assist Details for Bed Mobility Assistance: rolling to encourage pt awareness of surroundings especially on the right side, pt still moans with all mobility Transfers Transfers: Not assessed     Exercises  General Exercises - Upper Extremity Shoulder Flexion: PROM;Right;10 reps;Supine Shoulder Extension: PROM;Right;10 reps;Supine Shoulder ABduction: PROM;10 reps;Supine Shoulder ADduction: PROM;10 reps;Supine Elbow Flexion: PROM;Supine;10 reps Elbow Extension: PROM;Supine;10 reps Wrist Flexion: PROM;Supine;10 reps Wrist Extension: PROM;Supine;10 reps Digit Composite Flexion: PROM;Supine;10 reps Other Exercises Other Exercises: R UE positioned in elevation with pillow at end of tx session   Balance Balance Balance Assessed: Yes Static Sitting Balance Static Sitting - Balance Support: Right upper extremity supported Static Sitting - Level of Assistance: 4: Min assist Static Sitting - Comment/# of Minutes: weigth shifting sitting EOB, weight bearing through R UE with max A for support while seated EOB on tabletop Dynamic Sitting Balance Dynamic Sitting - Balance Support: Right upper extremity supported Dynamic Sitting - Level of Assistance: 4: Min assist;3: Mod assist Dynamic Sitting - Balance Activities:  Lateral lean/weight shifting;Forward lean/weight shifting;Reaching across midline    End of Session  OT - End of Session Activity Tolerance: Patient limited by fatigue Patient left: in bed;with call bell/phone within reach;with bed alarm set  GO     Galen Manila 08/09/2012, 3:49 PM

## 2012-08-10 DIAGNOSIS — I5020 Unspecified systolic (congestive) heart failure: Secondary | ICD-10-CM

## 2012-08-10 LAB — CBC
HCT: 33.7 % — ABNORMAL LOW (ref 36.0–46.0)
MCHC: 31.8 g/dL (ref 30.0–36.0)
Platelets: 234 10*3/uL (ref 150–400)
RDW: 18.3 % — ABNORMAL HIGH (ref 11.5–15.5)
WBC: 4.7 10*3/uL (ref 4.0–10.5)

## 2012-08-10 LAB — BASIC METABOLIC PANEL
BUN: 15 mg/dL (ref 6–23)
Calcium: 9.1 mg/dL (ref 8.4–10.5)
Creatinine, Ser: 0.57 mg/dL (ref 0.50–1.10)
GFR calc non Af Amer: 83 mL/min — ABNORMAL LOW (ref >90)
Glucose, Bld: 135 mg/dL — ABNORMAL HIGH (ref 70–99)
Sodium: 140 mEq/L (ref 135–145)

## 2012-08-10 LAB — GLUCOSE, CAPILLARY

## 2012-08-10 LAB — PROTIME-INR
INR: 2.52 — ABNORMAL HIGH (ref 0.00–1.49)
Prothrombin Time: 26 seconds — ABNORMAL HIGH (ref 11.6–15.2)

## 2012-08-10 NOTE — Progress Notes (Signed)
[facility] PROGRESS NOTE  Assessment/Plan:  Acute ischemic large left MCA stroke:  - On Coumadin  - continue statin  - Patient has very poor prognosis.  - Palliative to meet with family at SNF  - Pt sons keep trying to avoid pateint transfer to facility. As this is not the facility he wanted. - Consulted Psyq awaiting recommendation.  Dysphagia:  - underwent PEG tube placement on 08/02/2012  - tube feeds started, so far tolerating Jevity 1.2 CAL at 60 cc/ hour via G tube  - Continue free water 150 cc q8hrs  - Continue dysphagia 1 diet with pudding thick liquids as tolerated,outpatient speech therapy, swallow evaluations to upgrade diet as tolerated at the facility    Code Status: full Family Communication: son  Disposition Plan: SNF today   Consultants:  Neuro  Procedures:  MRI  Carotid  ECHO   Antibiotics:  None  HPI/Subjective: lethargic  Objective: Filed Vitals:   08/10/12 0121 08/10/12 0500 08/10/12 0517 08/10/12 0902  BP: 144/55  129/66 116/58  Pulse: 91  86 80  Temp: 98.3 F (36.8 C)  97.6 F (36.4 C) 98 F (36.7 C)  TempSrc: Axillary  Axillary Oral  Resp: 20  20 18   Height:      Weight:  81.5 kg (179 lb 10.8 oz)    SpO2: 95%  95% 98%    Intake/Output Summary (Last 24 hours) at 08/10/12 1016 Last data filed at 08/10/12 0900  Gross per 24 hour  Intake      0 ml  Output   1675 ml  Net  -1675 ml   Filed Weights   08/08/12 0601 08/09/12 0500 08/10/12 0500  Weight: 81 kg (178 lb 9.2 oz) 80.7 kg (177 lb 14.6 oz) 81.5 kg (179 lb 10.8 oz)    Exam:  General: , in no acute distress.  HEENT: No bruits, no goiter. drooling Heart: Regular rate and rhythm, without murmurs, rubs, gallops.  Neuro: Grossly intact, nonfocal.   Data Reviewed: Basic Metabolic Panel:  Recent Labs Lab 08/04/12 0859 08/10/12 0510  NA 137 140  K 4.1 4.2  CL 101 102  CO2 29 28  GLUCOSE 142* 135*  BUN 15 15  CREATININE 0.58 0.57  CALCIUM 8.8 9.1    Liver Function Tests: No results found for this basename: AST, ALT, ALKPHOS, BILITOT, PROT, ALBUMIN,  in the last 168 hours No results found for this basename: LIPASE, AMYLASE,  in the last 168 hours No results found for this basename: AMMONIA,  in the last 168 hours CBC:  Recent Labs Lab 08/06/12 0610 08/07/12 0555 08/08/12 0435 08/09/12 0555 08/10/12 0510  WBC 4.6 5.0 4.4 5.2 4.7  HGB 10.7* 10.7* 10.3* 10.6* 10.7*  HCT 34.1* 34.0* 32.5* 33.3* 33.7*  MCV 75.1* 74.7* 74.4* 74.7* 73.7*  PLT 232 235 228 225 234   Cardiac Enzymes: No results found for this basename: CKTOTAL, CKMB, CKMBINDEX, TROPONINI,  in the last 168 hours BNP (last 3 results)  Recent Labs  07/21/12 1635  PROBNP 9584.0*   CBG:  Recent Labs Lab 08/09/12 0646 08/09/12 1132 08/09/12 1735 08/09/12 2228 08/10/12 0701  GLUCAP 115* 150* 141* 162* 146*    No results found for this or any previous visit (from the past 240 hour(s)).   Studies: No results found.  Scheduled Meds: . free water  150 mL Per Tube Q8H  . furosemide  10 mg Per Tube Daily  . levothyroxine  75 mcg Oral QAC breakfast  . pantoprazole  sodium  40 mg Per Tube QHS  . simvastatin  20 mg Oral q1800  . warfarin  5 mg Per Tube q1800  . Warfarin - Pharmacist Dosing Inpatient   Does not apply q1800   Continuous Infusions: . feeding supplement (JEVITY 1.2 CAL) 60 mL/hr at 08/09/12 0250     Marinda Elk  [facility] Pager [phone]. If 8PM-8AM, please contact night-coverage at [email or web address], password [facility] 08/10/2012, 10:16 AM  LOS: 20 days

## 2012-08-10 NOTE — Progress Notes (Signed)
ANTICOAGULATION CONSULT NOTE - Follow Up Consult  Pharmacy Consult for Coumadin Indication: stroke and possible LV thrombus per TEE (possible LV thrombus)  Allergies  Allergen Reactions  . Hyoscyamine   . Ivp Dye (Iodinated Diagnostic Agents)   . Septra (Sulfamethoxazole W-Trimethoprim)     Patient Measurements: Height: 5\' 6"  (167.6 cm) Weight: 179 lb 10.8 oz (81.5 kg) IBW/kg (Calculated) : 59.3  Vital Signs: Temp: 98 F (36.7 C) (05/03 0902) Temp src: Oral (05/03 0902) BP: 116/58 mmHg (05/03 0902) Pulse Rate: 80 (05/03 0902)  Labs:  Recent Labs  08/08/12 0435 08/09/12 0555 08/10/12 0510  HGB 10.3* 10.6* 10.7*  HCT 32.5* 33.3* 33.7*  PLT 228 225 234  LABPROT 22.9* 24.2* 26.0*  INR 2.13* 2.29* 2.52*  CREATININE  --   --  0.57    Estimated Creatinine Clearance: 56.4 ml/min (by C-G formula based on Cr of 0.57).  Assessment: 79 yo female with CVA secondary to possible intracardiac thrombus on coumadin and INR is 2.52 with stable CBC.  Goal of Therapy:  INR 2-3 Monitor platelets by anticoagulation protocol: Yes   Plan:  -Continue coumadin 5mg /day -Daily INR for now  Harland German, Pharm D 08/10/2012 12:26 PM

## 2012-08-10 NOTE — Consult Note (Signed)
Reason for Consult:Capacity Referring Physician: Dr.Feliz  Jenny Brady is an 79 y.o. female.  HPI: 79 Y/O female with  the documented medical history as well as Dementia and a more recent CVA. She at her best has shown to be oriented as to person, not time or place. She has evidenced persistent cognitive deficits. She can follow one step commands. She is waiting to be placed but seems that her son is giving the staff a hard time in terms of where he wants her to be placed.  Past Medical History  Diagnosis Date  . Hyperthyroidism     had RAI-is on thyroid replacement, happened maybe this summer  . Coronary artery disease     Had a catheterization per son by Dr. [name] in 1991 no reports in  [place]  . GERD (gastroesophageal reflux disease)     Past Surgical History  Procedure Laterality Date  . None    . Tee without cardioversion N/A 07/26/2012    Procedure: TRANSESOPHAGEAL ECHOCARDIOGRAM (TEE);  Surgeon: Laurey Morale, MD;  Location: [facility] ENDOSCOPY;  Service: Cardiovascular;  Laterality: N/A;    Family History  Problem Relation Age of Onset  . Acne Mother     died giving birth to 8th kid  . Dementia Father     died 3  . Acne Brother     died 2-3 yrs ago  . Cervical cancer Sister     Social History:  reports that she has never smoked. She has never used smokeless tobacco. She reports that she does not drink alcohol or use illicit drugs.  Allergies:  Allergies  Allergen Reactions  . Hyoscyamine   . Ivp Dye (Iodinated Diagnostic Agents)   . Septra (Sulfamethoxazole W-Trimethoprim)     Medications: I have reviewed the patient's current medications.  Results for orders placed during the hospital encounter of 07/21/12 (from the past 48 hour(s))  GLUCOSE, CAPILLARY     Status: Abnormal   Collection Time    08/08/12  4:50 PM      Result Value Range   Glucose-Capillary 135 (*) 70 - 99 mg/dL   Comment 1 Notify RN    GLUCOSE, CAPILLARY     Status: Abnormal   Collection Time    08/08/12 11:00 PM      Result Value Range   Glucose-Capillary 119 (*) 70 - 99 mg/dL   Comment 1 Notify RN    CBC     Status: Abnormal   Collection Time    08/09/12  5:55 AM      Result Value Range   WBC 5.2  4.0 - 10.5 K/uL   RBC 4.46  3.87 - 5.11 MIL/uL   Hemoglobin 10.6 (*) 12.0 - 15.0 g/dL   HCT 16.1 (*) 09.6 - 04.5 %   MCV 74.7 (*) 78.0 - 100.0 fL   MCH 23.8 (*) 26.0 - 34.0 pg   MCHC 31.8  30.0 - 36.0 g/dL   RDW 40.9 (*) 81.1 - 91.4 %   Platelets 225  150 - 400 K/uL  PROTIME-INR     Status: Abnormal   Collection Time    08/09/12  5:55 AM      Result Value Range   Prothrombin Time 24.2 (*) 11.6 - 15.2 seconds   INR 2.29 (*) 0.00 - 1.49  GLUCOSE, CAPILLARY     Status: Abnormal   Collection Time    08/09/12  6:46 AM      Result Value Range   Glucose-Capillary 115 (*)  70 - 99 mg/dL  GLUCOSE, CAPILLARY     Status: Abnormal   Collection Time    08/09/12 11:32 AM      Result Value Range   Glucose-Capillary 150 (*) 70 - 99 mg/dL   Comment 1 Notify RN    GLUCOSE, CAPILLARY     Status: Abnormal   Collection Time    08/09/12  5:35 PM      Result Value Range   Glucose-Capillary 141 (*) 70 - 99 mg/dL  GLUCOSE, CAPILLARY     Status: Abnormal   Collection Time    08/09/12 10:28 PM      Result Value Range   Glucose-Capillary 162 (*) 70 - 99 mg/dL   Comment 1 Documented in Chart     Comment 2 Notify RN    CBC     Status: Abnormal   Collection Time    08/10/12  5:10 AM      Result Value Range   WBC 4.7  4.0 - 10.5 K/uL   RBC 4.57  3.87 - 5.11 MIL/uL   Hemoglobin 10.7 (*) 12.0 - 15.0 g/dL   HCT 16.1 (*) 09.6 - 04.5 %   MCV 73.7 (*) 78.0 - 100.0 fL   MCH 23.4 (*) 26.0 - 34.0 pg   MCHC 31.8  30.0 - 36.0 g/dL   RDW 40.9 (*) 81.1 - 91.4 %   Platelets 234  150 - 400 K/uL  PROTIME-INR     Status: Abnormal   Collection Time    08/10/12  5:10 AM      Result Value Range   Prothrombin Time 26.0 (*) 11.6 - 15.2 seconds   INR 2.52 (*) 0.00 - 1.49  BASIC  METABOLIC PANEL     Status: Abnormal   Collection Time    08/10/12  5:10 AM      Result Value Range   Sodium 140  135 - 145 mEq/L   Potassium 4.2  3.5 - 5.1 mEq/L   Chloride 102  96 - 112 mEq/L   CO2 28  19 - 32 mEq/L   Glucose, Bld 135 (*) 70 - 99 mg/dL   BUN 15  6 - 23 mg/dL   Creatinine, Ser 7.82  0.50 - 1.10 mg/dL   Calcium 9.1  8.4 - 95.6 mg/dL   GFR calc non Af Amer 83 (*) >90 mL/min   GFR calc Af Amer >90  >90 mL/min   Comment:            The eGFR has been calculated     using the CKD EPI equation.     This calculation has not been     validated in all clinical     situations.     eGFR's persistently     <90 mL/min signify     possible Chronic Kidney Disease.  GLUCOSE, CAPILLARY     Status: Abnormal   Collection Time    08/10/12  7:01 AM      Result Value Range   Glucose-Capillary 146 (*) 70 - 99 mg/dL   Comment 1 Documented in Chart     Comment 2 Notify RN    GLUCOSE, CAPILLARY     Status: Abnormal   Collection Time    08/10/12 12:02 PM      Result Value Range   Glucose-Capillary 148 (*) 70 - 99 mg/dL   Comment 1 Notify RN     Comment 2 Documented in Chart      No results found.  ROS Blood pressure 116/58, pulse 80, temperature 98 F (36.7 C), temperature source Oral, resp. rate 18, height 5\' 6"  (1.676 m), weight 81.5 kg (179 lb 10.8 oz), SpO2 98.00%. Physical Exam Jenny Brady is in bed. She is very hard to wake up. She  acknowledges that she is being called by opening her eyes, so she is responding to her name, but there is no spontaneous verbal communication.  Assessment/Plan: 79 Y/O female with Dementia S/P CVA who by history and a brief interaction  does not seem to be capable of making sounds decisions on her own behalf. It does not appear that she will improve to a point that her status can change. She needs to be placed at the facility that can best take care of her needs.  Jenny Brady A 08/10/2012, 1:23 PM

## 2012-08-11 LAB — CBC
MCHC: 31.1 g/dL (ref 30.0–36.0)
RDW: 18.3 % — ABNORMAL HIGH (ref 11.5–15.5)

## 2012-08-11 LAB — GLUCOSE, CAPILLARY: Glucose-Capillary: 145 mg/dL — ABNORMAL HIGH (ref 70–99)

## 2012-08-11 LAB — PROTIME-INR
INR: 2.75 — ABNORMAL HIGH (ref 0.00–1.49)
Prothrombin Time: 27.7 seconds — ABNORMAL HIGH (ref 11.6–15.2)

## 2012-08-11 MED ORDER — WARFARIN SODIUM 3 MG PO TABS
3.0000 mg | ORAL_TABLET | Freq: Once | ORAL | Status: AC
  Administered 2012-08-11: 3 mg via ORAL
  Filled 2012-08-11: qty 1

## 2012-08-11 NOTE — Progress Notes (Signed)
[facility] PROGRESS NOTE  Assessment/Plan:  Acute ischemic large left MCA stroke:  - On Coumadin  - continue statin  - Patient has very poor prognosis.  - Pt sons keep trying to avoid pateint transfer to facility. As this is not the facility he wanted. - Consulted Psyq Lack capacity will seek guardianship. - awaiting placement.  Dysphagia:  - underwent PEG tube placement on 08/02/2012  - tube feeds started, so far tolerating Jevity 1.2 CAL at 60 cc/ hour via G tube  - Continue free water 150 cc q8hrs  - Continue dysphagia 1 diet with pudding thick liquids as tolerated,outpatient speech therapy, swallow evaluations to upgrade diet as tolerated at the facility    Code Status: full Family Communication: son  Disposition Plan: SNF today   Consultants:  Neuro  Procedures:  MRI  Carotid  ECHO   Antibiotics:  None  HPI/Subjective: lethargic  Objective: Filed Vitals:   08/10/12 1900 08/10/12 2201 08/11/12 0154 08/11/12 0627  BP:  124/61 104/57 101/45  Pulse: 96 91 79 77  Temp: 98.1 F (36.7 C) 98 F (36.7 C) 97.7 F (36.5 C) 98.4 F (36.9 C)  TempSrc: Oral Axillary Oral Oral  Resp: 18 18 18 18   Height:      Weight:      SpO2:  98% 97% 99%    Intake/Output Summary (Last 24 hours) at 08/11/12 1010 Last data filed at 08/11/12 0155  Gross per 24 hour  Intake      0 ml  Output   1550 ml  Net  -1550 ml   Filed Weights   08/08/12 0601 08/09/12 0500 08/10/12 0500  Weight: 81 kg (178 lb 9.2 oz) 80.7 kg (177 lb 14.6 oz) 81.5 kg (179 lb 10.8 oz)    Exam:  General: , in no acute distress.  HEENT: No bruits, no goiter. drooling Heart: Regular rate and rhythm, without murmurs, rubs, gallops.  Neuro: Grossly intact, nonfocal.   Data Reviewed: Basic Metabolic Panel:  Recent Labs Lab 08/10/12 0510  NA 140  K 4.2  CL 102  CO2 28  GLUCOSE 135*  BUN 15  CREATININE 0.57  CALCIUM 9.1   Liver Function Tests: No results found for this basename:  AST, ALT, ALKPHOS, BILITOT, PROT, ALBUMIN,  in the last 168 hours No results found for this basename: LIPASE, AMYLASE,  in the last 168 hours No results found for this basename: AMMONIA,  in the last 168 hours CBC:  Recent Labs Lab 08/07/12 0555 08/08/12 0435 08/09/12 0555 08/10/12 0510 08/11/12 0500  WBC 5.0 4.4 5.2 4.7 5.3  HGB 10.7* 10.3* 10.6* 10.7* 10.7*  HCT 34.0* 32.5* 33.3* 33.7* 34.4*  MCV 74.7* 74.4* 74.7* 73.7* 75.3*  PLT 235 228 225 234 261   Cardiac Enzymes: No results found for this basename: CKTOTAL, CKMB, CKMBINDEX, TROPONINI,  in the last 168 hours BNP (last 3 results)  Recent Labs  07/21/12 1635  PROBNP 9584.0*   CBG:  Recent Labs Lab 08/10/12 0701 08/10/12 1202 08/10/12 1659 08/10/12 2205 08/11/12 0655  GLUCAP 146* 148* 160* 139* 131*    No results found for this or any previous visit (from the past 240 hour(s)).   Studies: No results found.  Scheduled Meds: . free water  150 mL Per Tube Q8H  . furosemide  10 mg Per Tube Daily  . levothyroxine  75 mcg Oral QAC breakfast  . pantoprazole sodium  40 mg Per Tube QHS  . simvastatin  20 mg Oral q1800  .  warfarin  5 mg Per Tube q1800  . Warfarin - Pharmacist Dosing Inpatient   Does not apply q1800   Continuous Infusions: . feeding supplement (JEVITY 1.2 CAL) 1,000 mL (08/10/12 2104)     Radonna Ricker Rosine Beat  [facility] Pager [phone]. If 8PM-8AM, please contact night-coverage at [email or web address], password [facility] 08/11/2012, 10:10 AM  LOS: 21 days

## 2012-08-11 NOTE — Progress Notes (Signed)
ANTICOAGULATION CONSULT NOTE - Follow Up Consult  Pharmacy Consult for Coumadin Indication: stroke and possible LV thrombus per TEE (possible LV thrombus)  Allergies  Allergen Reactions  . Hyoscyamine   . Ivp Dye (Iodinated Diagnostic Agents)   . Septra (Sulfamethoxazole W-Trimethoprim)     Patient Measurements: Height: 5\' 6"  (167.6 cm) Weight: 179 lb 10.8 oz (81.5 kg) IBW/kg (Calculated) : 59.3  Vital Signs: Temp: 98.9 F (37.2 C) (05/04 1020) Temp src: Oral (05/04 0627) BP: 104/62 mmHg (05/04 1020) Pulse Rate: 74 (05/04 1020)  Labs:  Recent Labs  08/09/12 0555 08/10/12 0510 08/11/12 0500  HGB 10.6* 10.7* 10.7*  HCT 33.3* 33.7* 34.4*  PLT 225 234 261  LABPROT 24.2* 26.0* 27.7*  INR 2.29* 2.52* 2.75*  CREATININE  --  0.57  --     Estimated Creatinine Clearance: 56.4 ml/min (by C-G formula based on Cr of 0.57).  Assessment: 79 yo female with CVA secondary to possible intracardiac thrombus on coumadin and INR is 2.75 with trend up.  Goal of Therapy:  INR 2-3 Monitor platelets by anticoagulation protocol: Yes   Plan:  -Coumadin 3mg  po today -Difficult to determine a maintenance dose at this time however would consider a regimen of 3mg /day -Daily INR for now  Harland German, Pharm D 08/11/2012 12:23 PM

## 2012-08-12 LAB — GLUCOSE, CAPILLARY
Glucose-Capillary: 138 mg/dL — ABNORMAL HIGH (ref 70–99)
Glucose-Capillary: 123 mg/dL — ABNORMAL HIGH (ref 70–99)
Glucose-Capillary: 149 mg/dL — ABNORMAL HIGH (ref 70–99)

## 2012-08-12 LAB — CBC
MCH: 23.5 pg — ABNORMAL LOW (ref 26.0–34.0)
Platelets: 261 10*3/uL (ref 150–400)
RBC: 4.63 MIL/uL (ref 3.87–5.11)
RDW: 18.3 % — ABNORMAL HIGH (ref 11.5–15.5)
WBC: 4.8 10*3/uL (ref 4.0–10.5)

## 2012-08-12 LAB — PROTIME-INR: Prothrombin Time: 25.4 seconds — ABNORMAL HIGH (ref 11.6–15.2)

## 2012-08-12 MED ORDER — WARFARIN SODIUM 3 MG PO TABS: 3.0000 mg | ORAL_TABLET | Freq: Once | ORAL | Status: DC

## 2012-08-12 MED ORDER — WARFARIN SODIUM 3 MG PO TABS
3.0000 mg | ORAL_TABLET | Freq: Once | ORAL | Status: AC
  Administered 2012-08-12: 3 mg via ORAL
  Filled 2012-08-12: qty 1

## 2012-08-12 NOTE — Clinical Social Work Note (Signed)
Clinical Social Work   CSW spoke with pt's son regarding discharge plan. Pt's son is agreeable to transfer to [facility] and expressed an interest in [facility]. Neither facility were able to complete admission paperwork this evening. Pt's son is agreeable to signing admission paperwork for pt to admit to SNF. CSW explained process of discharging in great detail. CSW also explained that pt is able to transfer facilities, if a bed becomes available at the facilities pt's son initially preferred. Pt's son is agreeable to a discharge first thing in the morning. CSW udated facility. MD is aware of delay. CSW will continue to follow to facilitate discharge to [facility] in AM.   Dede Query, MSW, LCSW [phone]

## 2012-08-12 NOTE — Progress Notes (Signed)
[facility] PROGRESS NOTE  Assessment/Plan:  Acute ischemic large left MCA stroke:  - On Coumadin INR Therapeutic - continue statin  - Patient has very poor prognosis.  - Pt sons keep trying to avoid pateint transfer to facility. As this is not the facility he wanted. - Consulted Psyq Lack capacity will seek guardianship. - awaiting placement. Pt ready to d/c  Dysphagia:  - underwent PEG tube placement on 08/02/2012  - tube feeds started, so far tolerating Jevity 1.2 CAL at 60 cc/ hour via G tube  - Continue free water 150 cc q8hrs  - Continue dysphagia 1 diet with pudding thick liquids as tolerated,outpatient speech therapy, swallow evaluations to upgrade diet as tolerated at the facility    Code Status: full Family Communication: son  Disposition Plan: SNF today   Consultants:  Neuro  Procedures:  MRI  Carotid  ECHO   Antibiotics:  None  HPI/Subjective: lethargic  Objective: Filed Vitals:   08/11/12 2200 08/12/12 0200 08/12/12 0500 08/12/12 0600  BP: 113/58 117/58  104/57  Pulse: 86 91  84  Temp: 98.5 F (36.9 C) 98.2 F (36.8 C)  97.3 F (36.3 C)  TempSrc: Axillary Axillary  Axillary  Resp: 20 20  20   Height:      Weight:   79.1 kg (174 lb 6.1 oz)   SpO2: 96% 98%  97%    Intake/Output Summary (Last 24 hours) at 08/12/12 0844 Last data filed at 08/11/12 1900  Gross per 24 hour  Intake    720 ml  Output   1050 ml  Net   -330 ml   Filed Weights   08/09/12 0500 08/10/12 0500 08/12/12 0500  Weight: 80.7 kg (177 lb 14.6 oz) 81.5 kg (179 lb 10.8 oz) 79.1 kg (174 lb 6.1 oz)    Exam:  General: , in no acute distress.  HEENT: No bruits, no goiter. drooling Heart: Regular rate and rhythm, without murmurs, rubs, gallops.  Neuro: Grossly intact, nonfocal.   Data Reviewed: Basic Metabolic Panel:  Recent Labs Lab 08/10/12 0510  NA 140  K 4.2  CL 102  CO2 28  GLUCOSE 135*  BUN 15  CREATININE 0.57  CALCIUM 9.1   Liver Function  Tests: No results found for this basename: AST, ALT, ALKPHOS, BILITOT, PROT, ALBUMIN,  in the last 168 hours No results found for this basename: LIPASE, AMYLASE,  in the last 168 hours No results found for this basename: AMMONIA,  in the last 168 hours CBC:  Recent Labs Lab 08/08/12 0435 08/09/12 0555 08/10/12 0510 08/11/12 0500 08/12/12 0625  WBC 4.4 5.2 4.7 5.3 4.8  HGB 10.3* 10.6* 10.7* 10.7* 10.9*  HCT 32.5* 33.3* 33.7* 34.4* 34.3*  MCV 74.4* 74.7* 73.7* 75.3* 74.1*  PLT 228 225 234 261 261   Cardiac Enzymes: No results found for this basename: CKTOTAL, CKMB, CKMBINDEX, TROPONINI,  in the last 168 hours BNP (last 3 results)  Recent Labs  07/21/12 1635  PROBNP 9584.0*   CBG:  Recent Labs Lab 08/11/12 0655 08/11/12 1234 08/11/12 1618 08/11/12 2125 08/12/12 0629  GLUCAP 131* 145* 153* 138* 123*    No results found for this or any previous visit (from the past 240 hour(s)).   Studies: No results found.  Scheduled Meds: . free water  150 mL Per Tube Q8H  . furosemide  10 mg Per Tube Daily  . levothyroxine  75 mcg Oral QAC breakfast  . pantoprazole sodium  40 mg Per Tube QHS  .  simvastatin  20 mg Oral q1800  . Warfarin - Pharmacist Dosing Inpatient   Does not apply q1800   Continuous Infusions: . feeding supplement (JEVITY 1.2 CAL) 1,000 mL (08/10/12 2104)     Radonna Ricker Rosine Beat  [facility] Pager [phone]. If 8PM-8AM, please contact night-coverage at [email or web address], password [facility] 08/12/2012, 8:44 AM  LOS: 22 days

## 2012-08-12 NOTE — Progress Notes (Signed)
Speech Language Pathology Treatment Patient Details Name: Jenny Brady MRN: [id number] DOB: February 01, 1926 Today's Date: 08/12/2012 Time: 4098-1191 SLP Time Calculation (min): 18 min  Assessment / Plan / Recommendation Clinical Impression  F/u for aphasia/dysphagia tx.  Pt alert; total assist for command following with use of familiar objects.  Responds to questions about preferences with head nod; no volitional phonation today.  Using spoon to self-feed Magic Cup with max assist for hand-to-mouth.  Showing adequate bolus recognition, mastication, and swallow trigger with no signs of airway compromise.  Pt appeared motivated to eat; consumed 2 oz of material with good toleration.  Continue SLP at SNF for aphasia/dysphagia with better prognosis for resumption of PO diet eventually.     SLP Plan  Continue with current plan of care    Pertinent Vitals/Pain None indicated  SLP Goals  SLP Goals Potential to Achieve Goals: Fair Potential Considerations: Co-morbidities;Severity of impairments;Previous level of function;Medical prognosis Progress/Goals/Alternative treatment plan discussed with pt/caregiver and they: No caregivers available SLP Goal #1: Pt will follow one step basic commands within functional activity with max verbal/tactile/visual cues and 75% accuracy.  SLP Goal #1 - Progress: Progressing toward goal SLP Goal #2: Pt will answer basic biographical/environmental  y/n questions with max verbal/tactile/visual cues and 75% accuracy.   SLP Goal #2 - Progress: Progressing toward goal SLP Goal #3: Pt will approximate articulation of bilabial sounds with total verbal/visual/tactile assist and 75% accuracy.   SLP Goal #3 - Progress: Progressing toward goal  General Temperature Spikes Noted: No Respiratory Status: Supplemental O2 delivered via (comment) Behavior/Cognition: Alert;Requires cueing Oral Cavity - Dentition: Adequate natural dentition Patient Positioning: Upright in  chair  Oral Cavity - Oral Hygiene Does patient have any of the following "at risk" factors?: Nutritional status - dependent feeder Patient is HIGH RISK - Oral Care Protocol followed (see row info): Yes   Treatment Treatment focused on: Aphasia (dysphagia) Skilled Treatment: language facilitation; command following with scaffolding.   GO    [name] L. Samson Frederic, [place] CCC/SLP Pager [phone]  [name] Laurice 08/12/2012, 12:12 PM

## 2012-08-12 NOTE — Progress Notes (Signed)
Physical Therapy Treatment Patient Details Name: Jenny Brady MRN: [id number] DOB: 25-Apr-1925 Today's Date: 08/12/2012 Time: 8657-8469 PT Time Calculation (min): 30 min  PT Assessment / Plan / Recommendation Comments on Treatment Session  Adm. s/p L MCA with multiple cognitive deficits as well as R hemiplegia. Slow progress continues. Due to slow progress will decrease her frequency. Goals have been updated. Awaiting SNF placement.     Follow Up Recommendations  SNF     Does the patient have the potential to tolerate intense rehabilitation     Barriers to Discharge        Equipment Recommendations       Recommendations for Other Services    Frequency Min 2X/week   Plan Discharge plan remains appropriate;Frequency needs to be updated    Precautions / Restrictions Precautions Precautions: Fall Precaution Comments: right neglect, vision deficits,  RUE 0/5 Restrictions Weight Bearing Restrictions: No   Pertinent Vitals/Pain Moaning throughout session, unable to localize where she might be painful, seems more like fear as when we stop moving her moaning stops    Mobility  Bed Mobility Details for Bed Mobility Assistance: gentle rocking prior to initiation of rolling to try to gain patient awareness of what we were going to do, max verbal and visual cues to turn head in the direction of rolling however pt still moaning throughout all mobilty and resistant (likely fear) Transfers Transfers: Sit to Stand;Stand to Sit;Stand Pivot Transfers Sit to Stand: 1: +2 Total assist Sit to Stand: Patient Percentage: 20% Stand to Sit: 1: +2 Total assist Stand Pivot Transfers: 1: +2 Total assist Stand Pivot Transfers: Patient Percentage: 20% Details for Transfer Assistance: gentle AP rocking in prep for transfer, poor patient awareness, moaning and difficulty getting her to focus on task (internally distracted, fearful); max facilitation bilaterally to translate trunk over BOS and pivot  hips to chair, unsure if she was taking steps today, seemed more fearful during transfer Ambulation/Gait Ambulation/Gait Assistance: Not tested (comment)    Exercises General Exercises - Lower Extremity Heel Slides: AAROM;Both;10 reps;Supine    PT Goals Acute Rehab PT Goals PT Goal Formulation: Patient unable to participate in goal setting Time For Goal Achievement: 08/19/12 PT Goal: Rolling Supine to Right Side - Progress: Discontinued (comment) (poor progress) Pt will Roll Supine to Left Side: with max assist PT Goal: Rolling Supine to Left Side - Progress: Revised due to lack of progress Pt will go Supine/Side to Sit: with mod assist PT Goal: Supine/Side to Sit - Progress: Progressing toward goal PT Goal: Sit at Edge Of Bed - Progress: Progressing toward goal Pt will go Sit to Supine/Side: with mod assist PT Goal: Sit to Supine/Side - Progress: Progressing toward goal Pt will go Sit to Stand: with +2 total assist (50%) PT Goal: Sit to Stand - Progress: Revised due to lack of progress Pt will go Stand to Sit: with max assist PT Goal: Stand to Sit - Progress: Revised due to lack of progress Pt will Transfer Bed to Chair/Chair to Bed: with +2 total assist (50%) PT Transfer Goal: Bed to Chair/Chair to Bed - Progress: Revised due to lack of progress PT Goal: Stand - Progress: Discontinued (comment) (no progress towards goal) Additional Goals Additional Goal #1: Pt will follow one step commands 50% of 20 minute PT session. PT Goal: Additional Goal #1 - Progress: Progressing toward goal  Visit Information  Last PT Received On: 08/12/12 Assistance Needed: +2    Subjective Data  Subjective: moaned at me when  I moved her leg, seemed like she was trying to say something; again responding consistently to her name    Cognition  Cognition Arousal/Alertness: Awake/alert Behavior During Therapy: Anxious Overall Cognitive Status: Impaired/Different from baseline Area of Impairment:  Attention;Following commands;Awareness Orientation Level: Disoriented to;Place;Time;Situation Current Attention Level: Focused Following Commands: Follows one step commands inconsistently General Comments: following commands maybe 5% of our session today  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 Sitting Balance Static Sitting - Balance Support: No upper extremity supported Static Sitting - Level of Assistance: 5: Stand by assistance;4: Min assist Static Sitting - Comment/# of Minutes: sits with slumped posture and lateral lean right Dynamic Sitting Balance Dynamic Sitting - Balance Activities: Lateral lean/weight shifting;Forward lean/weight shifting;Reaching across midline Dynamic Sitting - Comments: weight shifting sitting EOB for at least 8 minutes, weight bearing through RUE (support given through shoulder) and guided LUE to RUE for trunk rotation in sitting and to bring awareness to her right side  End of Session PT - End of Session Equipment Utilized During Treatment: Gait belt Activity Tolerance: Patient tolerated treatment well Patient left: in chair;with call bell/phone within reach Nurse Communication: Mobility status   GP     [facility] HELEN 08/12/2012, 12:15 PM

## 2012-08-12 NOTE — Progress Notes (Signed)
NUTRITION FOLLOW UP  Intervention:   Continue Jevity 1.2 @ 60 ml/hr with 150 ml H2O flush every 6 hours via PEG.   TF regimen provides 1728 kcal, 80 grams protein, 1166 ml H2O; total free water 1766 ml   Nutrition Dx:   Inadequate oral intake related to inability to eat as evidenced by NPO status; ongoing.  Goal:  Pt to meet >/= 90% of their estimated nutrition needs; met.   Monitor:  TF tolerance, weight trend, labs  Assessment:   Pt admitted with large MCA stroke. Pt discussed during rounds and with RN. Diet advanced by SLP to Dysphagia 1 with Pudding thick liquids, per RN pt is not eating. PEG 4/24.  Per RN TF going well. No residuals.  SNF placement being planned by team.   Pt discussed during rounds and with RN.      Height: Ht Readings from Last 1 Encounters:  07/22/12 5\' 6"  (1.676 m)    Weight Status:   Wt Readings from Last 1 Encounters:  08/12/12 174 lb 6.1 oz (79.1 kg)  Admission weight 188 lb 4/14  Re-estimated needs:  Kcal: 1600-1750  Protein: 80-90 grams  Fluid: > 1.6 L/day  Skin: no issues noted  Diet Order: Dysphagia 1 with Pudding Thick Liquids Meal Completion: 0%. Pt refusing.     Intake/Output Summary (Last 24 hours) at 08/12/12 1313 Last data filed at 08/11/12 1900  Gross per 24 hour  Intake    480 ml  Output      0 ml  Net    480 ml    Last BM: 4/29   Labs:   Recent Labs Lab 08/10/12 0510  NA 140  K 4.2  CL 102  CO2 28  BUN 15  CREATININE 0.57  CALCIUM 9.1  GLUCOSE 135*    CBG (last 3)   Recent Labs  08/11/12 2125 08/12/12 0629 08/12/12 1135  GLUCAP 138* 123* 145*    Scheduled Meds: . free water  150 mL Per Tube Q8H  . furosemide  10 mg Per Tube Daily  . levothyroxine  75 mcg Oral QAC breakfast  . pantoprazole sodium  40 mg Per Tube QHS  . simvastatin  20 mg Oral q1800  . Warfarin - Pharmacist Dosing Inpatient   Does not apply q1800    Continuous Infusions: . feeding supplement (JEVITY 1.2 CAL) 1,000 mL  (08/10/12 2104)    Kendell Bane RD, LDN, CNSC [phone] Pager [phone] After Hours Pager

## 2012-08-12 NOTE — Progress Notes (Signed)
ANTICOAGULATION CONSULT NOTE - Follow Up Consult  Pharmacy Consult for Coumadin Indication: stroke and possible LV thrombus per TEE (possible LV thrombus)  Allergies  Allergen Reactions  . Hyoscyamine   . Ivp Dye (Iodinated Diagnostic Agents)   . Septra (Sulfamethoxazole W-Trimethoprim)     Patient Measurements: Height: 5\' 6"  (167.6 cm) Weight: 174 lb 6.1 oz (79.1 kg) IBW/kg (Calculated) : 59.3  Vital Signs: Temp: 98.2 F (36.8 C) (05/05 1432) Temp src: Oral (05/05 1432) BP: 120/68 mmHg (05/05 1432) Pulse Rate: 81 (05/05 1432)  Labs:  Recent Labs  08/10/12 0510 08/11/12 0500 08/12/12 0625  HGB 10.7* 10.7* 10.9*  HCT 33.7* 34.4* 34.3*  PLT 234 261 261  LABPROT 26.0* 27.7* 25.4*  INR 2.52* 2.75* 2.44*  CREATININE 0.57  --   --     Estimated Creatinine Clearance: 55.5 ml/min (by C-G formula based on Cr of 0.57).  Assessment: 79 yo female with CVA secondary to possible intracardiac thrombus continues on coumadin. INR today remains therapeutic at 2.44. No bleeding noted, CBC is stable.   Goal of Therapy:  INR 2-3   Plan:  1. Coumadin 3mg  PO x 1 tonight 2. F/u AM INR  Lysle Pearl, PharmD, BCPS Pager # [phone] 08/12/2012 2:41 PM

## 2012-08-13 LAB — CBC
HCT: 33.4 % — ABNORMAL LOW (ref 36.0–46.0)
Hemoglobin: 10.8 g/dL — ABNORMAL LOW (ref 12.0–15.0)
MCH: 23.9 pg — ABNORMAL LOW (ref 26.0–34.0)
MCHC: 32.3 g/dL (ref 30.0–36.0)
MCV: 73.9 fL — ABNORMAL LOW (ref 78.0–100.0)
RDW: 18 % — ABNORMAL HIGH (ref 11.5–15.5)

## 2012-08-13 LAB — GLUCOSE, CAPILLARY: Glucose-Capillary: 152 mg/dL — ABNORMAL HIGH (ref 70–99)

## 2012-08-13 MED ORDER — WARFARIN SODIUM 3 MG PO TABS
3.0000 mg | ORAL_TABLET | Freq: Once | ORAL | Status: AC
  Administered 2012-08-13: 3 mg via ORAL
  Filled 2012-08-13: qty 1

## 2012-08-13 NOTE — Clinical Social Work Note (Signed)
Clinical Social Work   CSW spoke with pt's son to address dishcarge plan. Pt's son requested that pt be moved to a facility that accepts VA benfits. CSW pointed out that pt does not have VA beneifts at this time. Pt responded that "she will". CSW also shared that pt will be able to transfer to a VA contacted facility that once [place] benefits are approved.   CSW confirmed with [facility] that facility is able to accept pt today. CSW attempted to reach pt's son, phone call when straight to voicemail. At time of note, CSW left messages requesting a return phone call. CSW updated MD. CSW will continue to follow to facilitate discharge plan.   Dede Query, MSW, LCSW [phone]

## 2012-08-13 NOTE — Clinical Social Work Note (Signed)
Clinical Social Work   CSW received phone call from pt's son and he requested that [facility] admissions paperwork to be faxed to him, as he was unable to go to the facility due to transportation issues. CSW directed pt to admissions coordinator at facility so that he would be able to address the needed  paperwork. CSW followed up with facility, and facility spoke to pt's son and faxed the admissions paperwork to fax number given. CSW attempted to reach pt's son regarding pt's discharge to [facility] several times, however was not able to reach him. CSW left messages requesting a return phone call and addressing the importance of the admissions paperwork, as well as the plan for discharge. As of 4:25 PM, pt's son has not contacted facility or this CSW in regards to discharge plan or paperwork. CSW consulted with Director of Clinical Social Work in [facility], occupational. Due pt being medically ready for discharge today and lack of pt's son involvement in discharge plan, pt's niece, Dr. [name] Contogiannis, is willing and able to sign pt into facility, as pt's niece is a supportive family member.   Pt is ready for discharge to [facility]. Facility has received discharge summary and is ready to admit pt. [facility] will provide transportation to facility. CSW is signing off.  Dede Query, MSW, LCSW [phone]

## 2012-08-13 NOTE — Progress Notes (Signed)
RUA  PiccLine discontinued  Dressing intact  Awaiting for transfer to  SNF, Pt son [name]  came  And told that pt offered bed and [facility] and said he was not aware,  RN told pt son paper work done and transport is here to pick up pt and stated to call his lawyer but ask pt to do that by himself. Unit director came and talking to pt .Rn will call report to receiving facility.

## 2012-08-13 NOTE — Progress Notes (Signed)
Report called to [facility] facility and given to [place].

## 2012-08-13 NOTE — Progress Notes (Signed)
Patient discharged to [facility] at 2020 by EMS via stretcher. PICC line to left upper arm discontinued by IV team. Son was in building and declining for patient to be transferred. Unit DON notified and spoke with son. Patient transferred without any distress.

## 2012-08-13 NOTE — Progress Notes (Signed)
ANTICOAGULATION CONSULT NOTE - Follow Up Consult  Pharmacy Consult for Coumadin Indication: stroke and possible LV thrombus per TEE (possible LV thrombus)  Allergies  Allergen Reactions  . Hyoscyamine   . Ivp Dye (Iodinated Diagnostic Agents)   . Septra (Sulfamethoxazole W-Trimethoprim)     Patient Measurements: Height: 5\' 6"  (167.6 cm) Weight: 177 lb 4 oz (80.4 kg) IBW/kg (Calculated) : 59.3  Vital Signs: Temp: 98.8 F (37.1 C) (05/06 1412) Temp src: Oral (05/06 1412) BP: 99/58 mmHg (05/06 1412) Pulse Rate: 83 (05/06 1412)  Labs:  Recent Labs  08/11/12 0500 08/12/12 0625 08/13/12 0600  HGB 10.7* 10.9* 10.8*  HCT 34.4* 34.3* 33.4*  PLT 261 261 260  LABPROT 27.7* 25.4* 25.4*  INR 2.75* 2.44* 2.44*    Estimated Creatinine Clearance: 55.9 ml/min (by C-G formula based on Cr of 0.57).  Assessment: 79 yo female with CVA secondary to possible intracardiac thrombus continues on coumadin. INR today remains therapeutic at 2.44. No bleeding noted, CBC is stable. Appears that patient will be discharging today.  Goal of Therapy:  INR 2-3   Plan:  1. Coumadin 3mg  PO x 1 tonight if still admitted. 2. F/u AM INR 3. If discharged, would recommend continuing coumadin 3mg  daily  Lysle Pearl, PharmD, BCPS Pager # [phone] 08/13/2012 3:07 PM

## 2012-08-14 LAB — GLUCOSE, CAPILLARY: Glucose-Capillary: 125 mg/dL — ABNORMAL HIGH (ref 70–99)

## 2012-08-15 ENCOUNTER — Non-Acute Institutional Stay (SKILLED_NURSING_FACILITY): Payer: Medicare Other | Admitting: Internal Medicine

## 2012-08-15 DIAGNOSIS — I6350 Cerebral infarction due to unspecified occlusion or stenosis of unspecified cerebral artery: Secondary | ICD-10-CM

## 2012-08-15 DIAGNOSIS — I509 Heart failure, unspecified: Secondary | ICD-10-CM

## 2012-08-15 DIAGNOSIS — I63512 Cerebral infarction due to unspecified occlusion or stenosis of left middle cerebral artery: Secondary | ICD-10-CM

## 2012-08-15 DIAGNOSIS — F068 Other specified mental disorders due to known physiological condition: Secondary | ICD-10-CM

## 2012-08-15 DIAGNOSIS — I5023 Acute on chronic systolic (congestive) heart failure: Secondary | ICD-10-CM

## 2012-08-15 DIAGNOSIS — R1310 Dysphagia, unspecified: Secondary | ICD-10-CM

## 2012-08-15 NOTE — Progress Notes (Signed)
Patient ID: Jenny Brady, female   DOB: 16-Aug-1925, 79 y.o.   MRN: [id number]    PCP: [name], MD  Code Status: full code  Allergies  Allergen Reactions  . Hyoscyamine   . Ivp Dye (Iodinated Diagnostic Agents)   . Septra (Sulfamethoxazole W-Trimethoprim)     Chief Complaint: new admit post hospitalization 07/21/12- 08/13/12  HPI:  Jenny Brady is a 79 y.o. female with history of Coronary artery disease, dementia and GERD and was admitted to hospital with respiratory distress and decreased responsiveness. She had a pronounced right facial droop and right hemiparesis. A stat CT scan was ordered that did show acute nonhemorrhagic left MCA stroke. Neurology was consulted and patient was transferred to [facility]. Patient was not considered a candidate for TPA given unclear duration of her symptoms. Patient was aphasic. The workup was significant for mild UTI and mild pulmonary edema and atelectasis versus infiltrate. After full stroke workup it was thought to be from intracardiac thrombus. Iv heparin was started. She failed swallow evaluations and had peg tube placement on 08/02/12. She was started on coumadin after PEG tube was placed. She was put on heparin drip later changed to lovenox and tolerated tube feed well. Family meeting were conducted due to poor prognosis for patient but palliative services were declined. Patient was sent to SNF for further care and management. Patient is seen in her room today. She is non verbal besides moaning. She follows some commands. She is not able to provide any history. She is getting her tube feed and has foley in place.    Review of Systems:  Unable to obtain with pt being non verbal  Past Medical History  Diagnosis Date  . Hyperthyroidism     had RAI-is on thyroid replacement, happened maybe this summer  . Coronary artery disease     Had a catheterization per son by Dr. [name] in 1991 no reports in  [place]  . GERD  (gastroesophageal reflux disease)    Past Surgical History  Procedure Laterality Date  . None    . Tee without cardioversion N/A 07/26/2012    Procedure: TRANSESOPHAGEAL ECHOCARDIOGRAM (TEE);  Surgeon: Laurey Morale, MD;  Location: [facility] ENDOSCOPY;  Service: Cardiovascular;  Laterality: N/A;   Social History:   reports that she has never smoked. She has never used smokeless tobacco. She reports that she does not drink alcohol or use illicit drugs.  Family History  Problem Relation Age of Onset  . Acne Mother     died giving birth to 8th kid  . Dementia Father     died 40  . Acne Brother     died 2-3 yrs ago  . Cervical cancer Sister     Medications: Patient's Medications  New Prescriptions   No medications on file  Previous Medications   ALBUTEROL (PROVENTIL) (5 MG/ML) 0.5% NEBULIZER SOLUTION    Take 0.5 mLs (2.5 mg total) by nebulization every 4 (four) hours as needed for wheezing or shortness of breath.   CHOLECALCIFEROL (VITAMIN D) 1000 UNITS TABLET    Place 1 tablet (1,000 Units total) into feeding tube daily.   CYANOCOBALAMIN (VITAMIN B-12 PO)    Take 1 tablet by mouth daily.     ENOXAPARIN (LOVENOX) 150 MG/ML INJECTION    Inject 0.87 mLs (130 mg total) into the skin daily.   FUROSEMIDE (LASIX) 20 MG TABLET    Place 0.5 tablets (10 mg total) into feeding tube daily.  LEVOTHYROXINE (SYNTHROID, LEVOTHROID) 75 MCG TABLET    Place 1 tablet (75 mcg total) into feeding tube daily before breakfast.   LISINOPRIL (ZESTRIL) 2.5 MG TABLET    Place 1 tablet (2.5 mg total) into feeding tube daily.   MEMANTINE (NAMENDA) 5 MG TABLET    Take 1 tablet (5 mg total) by mouth 2 (two) times daily.   NUTRITIONAL SUPPLEMENTS (FEEDING SUPPLEMENT, JEVITY 1.2 CAL,) LIQD    Place 1,000 mLs into feeding tube continuous. At 52ml/hr   PANTOPRAZOLE SODIUM (PROTONIX) 40 MG/20 ML PACK    Place 20 mLs (40 mg total) into feeding tube at bedtime.   SIMVASTATIN (ZOCOR) 20 MG TABLET    Place 1 tablet (20 mg  total) into feeding tube daily at 6 PM.   WATER FOR IRRIGATION, STERILE (FREE WATER) SOLN    Place 150 mLs into feeding tube every 8 (eight) hours.  Modified Medications   Modified Medication Previous Medication   WARFARIN (COUMADIN) 4 MG TABLET warfarin (COUMADIN) 4 MG tablet      Take 5 mg by mouth one time only at 6 PM.    Take 1 tablet (4 mg total) by mouth one time only at 6 PM.  Discontinued Medications   WARFARIN (COUMADIN) 3 MG TABLET    Take 1 tablet (3 mg total) by mouth one time only at 6 PM.   Consults: Neurology, Dr. Pearlean Brownie                   Interventional radiology                     [facility] cardiology for TEE                   Speech therapy                 Physical Exam: Filed Vitals:   08/15/12 1800  BP: 116/66  Pulse: 89  Temp: 97.6 F (36.4 C)  Resp: 18  Height: 5\' 5"  (1.651 m)  Weight: 164 lb (74.39 kg)  SpO2: 94%   General: alert and awake ,drooling HEENT: dry mucus mebrane, no LAD CVS: S1-S2 normal Chest: Decreased breath sound at the bases   Abdomen: obese, soft, nontender, G-tube+, foley in place Extremities: no c/c/e   Neuro: right hemiparesis, non verbal  Labs reviewed: Basic Metabolic Panel:  Recent Labs  78/46/96 0515 08/04/12 0859 08/10/12 0510  NA 135 137 140  K 4.0 4.1 4.2  CL 101 101 102  CO2 28 29 28   GLUCOSE 105* 142* 135*  BUN 15 15 15   CREATININE 0.79 0.58 0.57  CALCIUM 8.5 8.8 9.1   Liver Function Tests:  Recent Labs  07/21/12 1635  AST 16  ALT 12  ALKPHOS 107  BILITOT 0.6  PROT 6.4  ALBUMIN 2.7*   CBC:  Recent Labs  07/21/12 1635  08/11/12 0500 08/12/12 0625 08/13/12 0600  WBC 6.3  < > 5.3 4.8 4.7  NEUTROABS 4.4  --   --   --   --   HGB 10.7*  < > 10.7* 10.9* 10.8*  HCT 35.2*  < > 34.4* 34.3* 33.4*  MCV 77.7*  < > 75.3* 74.1* 73.9*  PLT 308  < > 261 261 260  < > = values in this interval not displayed. Cardiac Enzymes:  Recent Labs  07/22/12 0032 07/22/12 0530 07/22/12 1207  TROPONINI <0.30  <0.30 <0.30   CBG:  Recent Labs  08/13/12 0755  08/13/12 1138 08/13/12 1654  GLUCAP 125* 152* 159*    Radiological Exams:   CT of the brain 07/21/2012 1. Large nonhemorrhagic left middle cerebral artery infarct with early edema without midline shift. 2. Old left occipital infarct. 3. Atrophy, most prominent in the temporal lobes.   MRI of the brain 07/22/2012 Motion degraded exam. Large left middle cerebral artery distribution infarct as noted above. Thrombus within the left middle cerebral artery suspected with occluded branches distal to this region.   MRA of the brain 1. Left MCA occluded or nearly occluded in the M1 segment just beyond its origin. This corresponds to the earlier MRI findings. Faint more distal left MCA flow signal; little to no M2 segment   flow signal. 2. Moderately degraded by motion despite repeated imaging   attempts.   2D Echocardiogram EF 15% with no source of embolus.   Carotid Doppler No evidence of hemodynamically significant internal carotid artery stenosis. Vertebral artery flow is antegrade.   TEE 07/26/2012 - ejection fraction 20-25%. There was a small mobile structure attached to the anterior wall of the LV. This may have been a loose trabeculation but cannot rule out thrombus given history."Would consider anticoagulation if she would be a safe candidate"  TCD Normal mean flow velocities in right middle cerebral, basilar, right vertebral, bilateral carotid siphons and opthalmic arteries. Suboptimal windows throghout limits exam.   CXR  07/22/2012 1. Persistent pulmonary edema. 2. No change in aeration to the left lower lobe.   07/21/2012 1. Cardiomegaly and pulmonary edema. 2. Small left pleural effusion and basilar airspace disease which could be due to atelectasis or pneumonia.   EKG normal sinus rhythm, LBBB.   Assessment/Plan  Acute ischemic large left MCA stroke- on coumadin via peg tube, non verbal andright sided hemiparesis. To work with  pt/ot with muscle group exercise. Has poor prognosis. Monitor inr. Continue albuterol prn to help with breathing. Continue PPI for stress ulcer prevention. Continue statin and bp under control with current regimen. Monitor. Will need family meeting at some point to review goals of care.  Dysphagia- s/p peg tube and on jevity feed. Continue free water.  Vascular dementia- persists, non verbal  Hypothyroidsim- Continue Synthroid daily. Follow TSH in 4-6 weeks.    Coronary artery disease with acute on chronic systolic CHF- EF was 25-30% in 2012. On lasix at present with low dose lisinopril. Once bp imporves, can consider b blocker.   Family/ staff Communication: reviewed care plan with nursing supervisor   Goals of care: family meeting, have palliative care consult after that and have her on comfort measures  Labs/tests ordered: cbc, cmp

## 2012-08-21 ENCOUNTER — Non-Acute Institutional Stay (INDEPENDENT_AMBULATORY_CARE_PROVIDER_SITE_OTHER): Payer: Medicare Other | Admitting: Internal Medicine

## 2012-08-21 DIAGNOSIS — I5023 Acute on chronic systolic (congestive) heart failure: Secondary | ICD-10-CM

## 2012-08-21 DIAGNOSIS — I6350 Cerebral infarction due to unspecified occlusion or stenosis of unspecified cerebral artery: Secondary | ICD-10-CM

## 2012-08-21 DIAGNOSIS — I63512 Cerebral infarction due to unspecified occlusion or stenosis of left middle cerebral artery: Secondary | ICD-10-CM

## 2012-08-21 DIAGNOSIS — I509 Heart failure, unspecified: Secondary | ICD-10-CM

## 2012-08-21 NOTE — Progress Notes (Signed)
Patient ID: Jenny Brady, female   DOB: 10-25-1925, 79 y.o.   MRN: [id number]  Chief Complaint: increased respiratory rate/distress HPI:  79 yo female [facility] for short term rehab s/p hospitalization with left mca stroke complicated by acute on chronic CHF and left community acquired pneumonia.  She had completed her abx for pneumonia prior to admission.  She was started on a zpak for acute bronchitis on 08/17/12 and completes this today.  Her chest xray revealed cardiomegaly and peribronchial cuffing c/w bronchitis vs. Pulmonary edema on 08/17/12.    Review of Systems:  Review of Systems  Unable to perform ROS: patient nonverbal     Medications: Patient's Medications  New Prescriptions   No medications on file  Previous Medications   ALBUTEROL (PROVENTIL) (5 MG/ML) 0.5% NEBULIZER SOLUTION    Take 0.5 mLs (2.5 mg total) by nebulization every 4 (four) hours as needed for wheezing or shortness of breath.   CHOLECALCIFEROL (VITAMIN D) 1000 UNITS TABLET    Place 1 tablet (1,000 Units total) into feeding tube daily.   CYANOCOBALAMIN (VITAMIN B-12 PO)    Take 1 tablet by mouth daily.     ENOXAPARIN (LOVENOX) 150 MG/ML INJECTION    Inject 0.87 mLs (130 mg total) into the skin daily.   FUROSEMIDE (LASIX) 20 MG TABLET    Place 0.5 tablets (10 mg total) into feeding tube daily.   LEVOTHYROXINE (SYNTHROID, LEVOTHROID) 75 MCG TABLET    Place 1 tablet (75 mcg total) into feeding tube daily before breakfast.   LISINOPRIL (ZESTRIL) 2.5 MG TABLET    Place 1 tablet (2.5 mg total) into feeding tube daily.   MEMANTINE (NAMENDA) 5 MG TABLET    Take 1 tablet (5 mg total) by mouth 2 (two) times daily.   NUTRITIONAL SUPPLEMENTS (FEEDING SUPPLEMENT, JEVITY 1.2 CAL,) LIQD    Place 1,000 mLs into feeding tube continuous. At 57ml/hr   PANTOPRAZOLE SODIUM (PROTONIX) 40 MG/20 ML PACK    Place 20 mLs (40 mg total) into feeding tube at bedtime.   SIMVASTATIN (ZOCOR) 20 MG TABLET     Place 1 tablet (20 mg total) into feeding tube daily at 6 PM.   WARFARIN (COUMADIN) 4 MG TABLET    Take 5 mg by mouth one time only at 6 PM.   WATER FOR IRRIGATION, STERILE (FREE WATER) SOLN    Place 150 mLs into feeding tube every 8 (eight) hours.  Modified Medications   No medications on file  Discontinued Medications   No medications on file     Physical Exam:  There were no vitals filed for this visit. Physical Exam  Nursing note and vitals reviewed. Constitutional:  Ill-appearing female, minimally responsive--following some commands, tachypneic  HENT:  Head: Normocephalic and atraumatic.  Cardiovascular: Normal rate, regular rhythm, normal heart sounds and intact distal pulses.   Pulmonary/Chest: She is in respiratory distress. She has rales.  Abdominal: Soft. Bowel sounds are normal.  PEG in place  Genitourinary:  Foley in place  Neurological:  Awake, but minimally interactive, does open eyes, aphasic      Labs reviewed: Basic Metabolic Panel:  Recent Labs  95/62/13 0515 08/04/12 0859 08/10/12 0510  NA 135 137 140  K 4.0 4.1 4.2  CL 101 101 102  CO2 28 29 28   GLUCOSE 105* 142* 135*  BUN 15 15 15   CREATININE 0.79 0.58 0.57  CALCIUM 8.5 8.8 9.1    Liver Function Tests:  Recent Labs  07/21/12  1635  AST 16  ALT 12  ALKPHOS 107  BILITOT 0.6  PROT 6.4  ALBUMIN 2.7*    CBC:  Recent Labs  07/21/12 1635  08/11/12 0500 08/12/12 0625 08/13/12 0600  WBC 6.3  < > 5.3 4.8 4.7  NEUTROABS 4.4  --   --   --   --   HGB 10.7*  < > 10.7* 10.9* 10.8*  HCT 35.2*  < > 34.4* 34.3* 33.4*  MCV 77.7*  < > 75.3* 74.1* 73.9*  PLT 308  < > 261 261 260  < > = values in this interval not displayed.  Significant Diagnostic Results: PCXR:  Cardiomegaly and peribronchial cuffing consistent with acute bronchitis vs. Pulmonary edema  Assessment/Plan 1. Systolic CHF, acute on chronic --will give lasix  40mg  daily and check bmp in 1 wk --also may be aspirating due  to her dysphagia secondary to her stroke--she is on tube feeding via PEG  2. Acute ischemic left MCA stroke --severe with aphasia, dysphagia, and minimal responsiveness --prognosis poor, but thus far her family has requested continued full code status and aggressive mgt of her acute illnesses pending family meeting and palliative care consultation that were ordered at admission   Family/ staff Communication: seen with unit supervisor Labs/tests ordered:  Bmp next week

## 2012-09-09 ENCOUNTER — Non-Acute Institutional Stay (SKILLED_NURSING_FACILITY): Payer: Medicare Other | Admitting: Internal Medicine

## 2012-09-09 DIAGNOSIS — I6350 Cerebral infarction due to unspecified occlusion or stenosis of unspecified cerebral artery: Secondary | ICD-10-CM

## 2012-09-09 DIAGNOSIS — Z7901 Long term (current) use of anticoagulants: Secondary | ICD-10-CM

## 2012-09-09 DIAGNOSIS — I63512 Cerebral infarction due to unspecified occlusion or stenosis of left middle cerebral artery: Secondary | ICD-10-CM

## 2012-09-09 NOTE — Progress Notes (Signed)
Patient ID: Jenny Brady, female   DOB: 30-Jun-1925, 79 y.o.   MRN: [id number]  AV- couamdin management  HPI-  79 y/o female patient with hx of CAD, CHF and CVA is on coumadin for secondary stroke prophylaxis. inr today 1.99 and pt is on 5 mg coumadin daily. No bleeding reported. No new focal weakness reported. Has been working with therpay team  A/P  Long term anticoagulation- continue coumadin 5 mg daily and recheck inr 09/12/12. Goal inr 2-3  cva- continue working with therapy team. bp under control. Continue current regimen along with statin. inr reviewed and dose adjusted. See above

## 2012-09-10 DIAGNOSIS — Z7901 Long term (current) use of anticoagulants: Secondary | ICD-10-CM | POA: Insufficient documentation

## 2012-09-13 ENCOUNTER — Non-Acute Institutional Stay (SKILLED_NURSING_FACILITY): Payer: Medicare Other | Admitting: Internal Medicine

## 2012-09-13 DIAGNOSIS — I6350 Cerebral infarction due to unspecified occlusion or stenosis of unspecified cerebral artery: Secondary | ICD-10-CM

## 2012-09-13 DIAGNOSIS — I63512 Cerebral infarction due to unspecified occlusion or stenosis of left middle cerebral artery: Secondary | ICD-10-CM

## 2012-09-13 DIAGNOSIS — Z7901 Long term (current) use of anticoagulants: Secondary | ICD-10-CM

## 2012-09-13 NOTE — Progress Notes (Signed)
Patient ID: Jenny Brady, female   DOB: [birth year], 79 y.o.   MRN: [id number]  AV- couamdin management   HPI-  79 y/o female patient with hx of CAD, CHF and CVA is on coumadin for secondary stroke prophylaxis. inr today 1.68 and pt is on 5 mg coumadin daily. No bleeding reported. No new focal weakness reported. Has been working with therpay team   ROS- No fever or chills No falls reported No bruise or ecchymoses  Vital signs stable  gen- elderly female in NAD heent- no pallor, no icterus, no LAD, MMM cvs- rrr, n s1 and s2 abdo- soft, non tender   A/P  Long term anticoagulation- increase coumadin to 6 mg daily and recheck inr 09/17/12. Goal inr 2-3   cva- continue working with therapy team. bp under control. Continue current regimen along with statin. inr reviewed and dose adjusted. See above

## 2012-09-16 ENCOUNTER — Emergency Department (HOSPITAL_COMMUNITY): Payer: Medicare Other

## 2012-09-16 ENCOUNTER — Inpatient Hospital Stay (HOSPITAL_COMMUNITY)
Admission: EM | Admit: 2012-09-16 | Discharge: 2012-09-25 | DRG: 871 | Disposition: A | Discharge status: Skilled Nursing Facility | Payer: Medicare Other | Attending: Internal Medicine | Admitting: Internal Medicine

## 2012-09-16 ENCOUNTER — Encounter (HOSPITAL_COMMUNITY): Payer: Self-pay | Admitting: Emergency Medicine

## 2012-09-16 DIAGNOSIS — N2889 Other specified disorders of kidney and ureter: Secondary | ICD-10-CM | POA: Diagnosis present

## 2012-09-16 DIAGNOSIS — Z7901 Long term (current) use of anticoagulants: Secondary | ICD-10-CM

## 2012-09-16 DIAGNOSIS — T45515A Adverse effect of anticoagulants, initial encounter: Secondary | ICD-10-CM | POA: Diagnosis present

## 2012-09-16 DIAGNOSIS — R627 Adult failure to thrive: Secondary | ICD-10-CM | POA: Diagnosis present

## 2012-09-16 DIAGNOSIS — F03918 Unspecified dementia, unspecified severity, with other behavioral disturbance: Secondary | ICD-10-CM

## 2012-09-16 DIAGNOSIS — K219 Gastro-esophageal reflux disease without esophagitis: Secondary | ICD-10-CM

## 2012-09-16 DIAGNOSIS — K922 Gastrointestinal hemorrhage, unspecified: Secondary | ICD-10-CM | POA: Diagnosis present

## 2012-09-16 DIAGNOSIS — Z931 Gastrostomy status: Secondary | ICD-10-CM

## 2012-09-16 DIAGNOSIS — E876 Hypokalemia: Secondary | ICD-10-CM | POA: Diagnosis not present

## 2012-09-16 DIAGNOSIS — E0590 Thyrotoxicosis, unspecified without thyrotoxic crisis or storm: Secondary | ICD-10-CM

## 2012-09-16 DIAGNOSIS — Z79899 Other long term (current) drug therapy: Secondary | ICD-10-CM

## 2012-09-16 DIAGNOSIS — Z638 Other specified problems related to primary support group: Secondary | ICD-10-CM

## 2012-09-16 DIAGNOSIS — K5730 Diverticulosis of large intestine without perforation or abscess without bleeding: Secondary | ICD-10-CM | POA: Diagnosis present

## 2012-09-16 DIAGNOSIS — D649 Anemia, unspecified: Secondary | ICD-10-CM

## 2012-09-16 DIAGNOSIS — I69920 Aphasia following unspecified cerebrovascular disease: Secondary | ICD-10-CM

## 2012-09-16 DIAGNOSIS — N179 Acute kidney failure, unspecified: Secondary | ICD-10-CM | POA: Diagnosis present

## 2012-09-16 DIAGNOSIS — I2510 Atherosclerotic heart disease of native coronary artery without angina pectoris: Secondary | ICD-10-CM

## 2012-09-16 DIAGNOSIS — E46 Unspecified protein-calorie malnutrition: Secondary | ICD-10-CM | POA: Diagnosis present

## 2012-09-16 DIAGNOSIS — I5023 Acute on chronic systolic (congestive) heart failure: Secondary | ICD-10-CM

## 2012-09-16 DIAGNOSIS — B957 Other staphylococcus as the cause of diseases classified elsewhere: Secondary | ICD-10-CM

## 2012-09-16 DIAGNOSIS — I513 Intracardiac thrombosis, not elsewhere classified: Secondary | ICD-10-CM | POA: Diagnosis present

## 2012-09-16 DIAGNOSIS — R1310 Dysphagia, unspecified: Secondary | ICD-10-CM | POA: Diagnosis present

## 2012-09-16 DIAGNOSIS — I69959 Hemiplegia and hemiparesis following unspecified cerebrovascular disease affecting unspecified side: Secondary | ICD-10-CM

## 2012-09-16 DIAGNOSIS — N183 Chronic kidney disease, stage 3 unspecified: Secondary | ICD-10-CM | POA: Diagnosis present

## 2012-09-16 DIAGNOSIS — K589 Irritable bowel syndrome without diarrhea: Secondary | ICD-10-CM | POA: Diagnosis present

## 2012-09-16 DIAGNOSIS — R6520 Severe sepsis without septic shock: Secondary | ICD-10-CM | POA: Diagnosis present

## 2012-09-16 DIAGNOSIS — D62 Acute posthemorrhagic anemia: Secondary | ICD-10-CM | POA: Diagnosis present

## 2012-09-16 DIAGNOSIS — A411 Sepsis due to other specified staphylococcus: Principal | ICD-10-CM | POA: Diagnosis present

## 2012-09-16 DIAGNOSIS — K625 Hemorrhage of anus and rectum: Secondary | ICD-10-CM

## 2012-09-16 DIAGNOSIS — Z6827 Body mass index (BMI) 27.0-27.9, adult: Secondary | ICD-10-CM

## 2012-09-16 DIAGNOSIS — E039 Hypothyroidism, unspecified: Secondary | ICD-10-CM | POA: Diagnosis present

## 2012-09-16 DIAGNOSIS — I252 Old myocardial infarction: Secondary | ICD-10-CM

## 2012-09-16 DIAGNOSIS — F0391 Unspecified dementia with behavioral disturbance: Secondary | ICD-10-CM

## 2012-09-16 DIAGNOSIS — R7881 Bacteremia: Secondary | ICD-10-CM

## 2012-09-16 DIAGNOSIS — A419 Sepsis, unspecified organism: Secondary | ICD-10-CM

## 2012-09-16 DIAGNOSIS — Z8673 Personal history of transient ischemic attack (TIA), and cerebral infarction without residual deficits: Secondary | ICD-10-CM

## 2012-09-16 DIAGNOSIS — I509 Heart failure, unspecified: Secondary | ICD-10-CM | POA: Diagnosis present

## 2012-09-16 DIAGNOSIS — I69991 Dysphagia following unspecified cerebrovascular disease: Secondary | ICD-10-CM

## 2012-09-16 DIAGNOSIS — I959 Hypotension, unspecified: Secondary | ICD-10-CM

## 2012-09-16 DIAGNOSIS — G4733 Obstructive sleep apnea (adult) (pediatric): Secondary | ICD-10-CM | POA: Diagnosis present

## 2012-09-16 DIAGNOSIS — J9600 Acute respiratory failure, unspecified whether with hypoxia or hypercapnia: Secondary | ICD-10-CM | POA: Diagnosis present

## 2012-09-16 DIAGNOSIS — R9389 Abnormal findings on diagnostic imaging of other specified body structures: Secondary | ICD-10-CM

## 2012-09-16 DIAGNOSIS — D638 Anemia in other chronic diseases classified elsewhere: Secondary | ICD-10-CM | POA: Diagnosis present

## 2012-09-16 DIAGNOSIS — R339 Retention of urine, unspecified: Secondary | ICD-10-CM | POA: Diagnosis present

## 2012-09-16 DIAGNOSIS — J9601 Acute respiratory failure with hypoxia: Secondary | ICD-10-CM | POA: Diagnosis present

## 2012-09-16 DIAGNOSIS — F0150 Vascular dementia without behavioral disturbance: Secondary | ICD-10-CM | POA: Diagnosis present

## 2012-09-16 DIAGNOSIS — N390 Urinary tract infection, site not specified: Secondary | ICD-10-CM

## 2012-09-16 DIAGNOSIS — K5641 Fecal impaction: Secondary | ICD-10-CM

## 2012-09-16 DIAGNOSIS — I5022 Chronic systolic (congestive) heart failure: Secondary | ICD-10-CM

## 2012-09-16 DIAGNOSIS — J189 Pneumonia, unspecified organism: Secondary | ICD-10-CM

## 2012-09-16 DIAGNOSIS — E860 Dehydration: Secondary | ICD-10-CM | POA: Diagnosis present

## 2012-09-16 HISTORY — DX: Irritable bowel syndrome, unspecified: K58.9

## 2012-09-16 HISTORY — DX: Cerebral infarction, unspecified: I63.9

## 2012-09-16 HISTORY — DX: Acute embolism and thrombosis of unspecified deep veins of unspecified lower extremity: I82.409

## 2012-09-16 HISTORY — DX: Aphasia: R47.01

## 2012-09-16 HISTORY — DX: Chronic systolic (congestive) heart failure: I50.22

## 2012-09-16 HISTORY — DX: Intracardiac thrombosis, not elsewhere classified: I51.3

## 2012-09-16 HISTORY — DX: Dysphagia following cerebral infarction: I69.391

## 2012-09-16 HISTORY — DX: Obesity, unspecified: E66.9

## 2012-09-16 HISTORY — DX: Obstructive sleep apnea (adult) (pediatric): G47.33

## 2012-09-16 HISTORY — DX: Unspecified dementia, unspecified severity, without behavioral disturbance, psychotic disturbance, mood disturbance, and anxiety: F03.90

## 2012-09-16 LAB — URINALYSIS, ROUTINE W REFLEX MICROSCOPIC
Bilirubin Urine: NEGATIVE
Glucose, UA: NEGATIVE mg/dL
Glucose, UA: NEGATIVE mg/dL
Ketones, ur: NEGATIVE mg/dL
Nitrite: NEGATIVE
Protein, ur: 100 mg/dL — AB
Protein, ur: 100 mg/dL — AB
Specific Gravity, Urine: 1.015 (ref 1.005–1.030)
pH: 6 (ref 5.0–8.0)

## 2012-09-16 LAB — POCT I-STAT, CHEM 8
Chloride: 104 mEq/L (ref 96–112)
HCT: 40 % (ref 36.0–46.0)
Potassium: 4.7 mEq/L (ref 3.5–5.1)

## 2012-09-16 LAB — COMPREHENSIVE METABOLIC PANEL
AST: 28 U/L (ref 0–37)
Albumin: 2.3 g/dL — ABNORMAL LOW (ref 3.5–5.2)
BUN: 56 mg/dL — ABNORMAL HIGH (ref 6–23)
Calcium: 9.7 mg/dL (ref 8.4–10.5)
Chloride: 100 mEq/L (ref 96–112)
Creatinine, Ser: 0.89 mg/dL (ref 0.50–1.10)
Total Bilirubin: 0.5 mg/dL (ref 0.3–1.2)

## 2012-09-16 LAB — CBC WITH DIFFERENTIAL/PLATELET
Basophils Absolute: 0 10*3/uL (ref 0.0–0.1)
Eosinophils Absolute: 0 10*3/uL (ref 0.0–0.7)
Eosinophils Relative: 0 % (ref 0–5)
Lymphs Abs: 1.1 10*3/uL (ref 0.7–4.0)
MCH: 25.9 pg — ABNORMAL LOW (ref 26.0–34.0)
MCHC: 32.3 g/dL (ref 30.0–36.0)
MCV: 80.1 fL (ref 78.0–100.0)
Monocytes Absolute: 0.8 10*3/uL (ref 0.1–1.0)
Neutrophils Relative %: 84 % — ABNORMAL HIGH (ref 43–77)
Platelets: 259 10*3/uL (ref 150–400)
RBC: 4.98 MIL/uL (ref 3.87–5.11)
RDW: 23.5 % — ABNORMAL HIGH (ref 11.5–15.5)
WBC Morphology: INCREASED

## 2012-09-16 LAB — URINE MICROSCOPIC-ADD ON

## 2012-09-16 LAB — POCT I-STAT 3, ART BLOOD GAS (G3+)
Acid-Base Excess: 3 mmol/L — ABNORMAL HIGH (ref 0.0–2.0)
O2 Saturation: 94 %
Patient temperature: 37.9
TCO2: 29 mmol/L (ref 0–100)

## 2012-09-16 LAB — PROCALCITONIN: Procalcitonin: 0.44 ng/mL

## 2012-09-16 LAB — ABO/RH: ABO/RH(D): A POS

## 2012-09-16 LAB — POCT I-STAT TROPONIN I: Troponin i, poc: 0.07 ng/mL (ref 0.00–0.08)

## 2012-09-16 LAB — PROTIME-INR
INR: 3.14 — ABNORMAL HIGH (ref 0.00–1.49)
Prothrombin Time: 30.6 seconds — ABNORMAL HIGH (ref 11.6–15.2)

## 2012-09-16 LAB — LACTIC ACID, PLASMA: Lactic Acid, Venous: 2.9 mmol/L — ABNORMAL HIGH (ref 0.5–2.2)

## 2012-09-16 LAB — PREPARE RBC (CROSSMATCH)

## 2012-09-16 LAB — LIPASE, BLOOD: Lipase: 32 U/L (ref 11–59)

## 2012-09-16 LAB — CG4 I-STAT (LACTIC ACID): Lactic Acid, Venous: 2.99 mmol/L — ABNORMAL HIGH (ref 0.5–2.2)

## 2012-09-16 MED ORDER — PANTOPRAZOLE SODIUM 40 MG PO PACK
40.0000 mg | PACK | Freq: Every day | ORAL | Status: DC
  Filled 2012-09-16: qty 20

## 2012-09-16 MED ORDER — VITAMIN K1 10 MG/ML IJ SOLN
10.0000 mg | Freq: Once | INTRAVENOUS | Status: AC
  Administered 2012-09-16: 10 mg via INTRAVENOUS
  Filled 2012-09-16 (×2): qty 1

## 2012-09-16 MED ORDER — SODIUM CHLORIDE 0.9 % IV BOLUS (SEPSIS)
1000.0000 mL | Freq: Once | INTRAVENOUS | Status: AC
  Administered 2012-09-16: 1000 mL via INTRAVENOUS

## 2012-09-16 MED ORDER — ALBUTEROL SULFATE (5 MG/ML) 0.5% IN NEBU: 2.5000 mg | INHALATION_SOLUTION | RESPIRATORY_TRACT | Status: DC | PRN

## 2012-09-16 MED ORDER — PIPERACILLIN-TAZOBACTAM 3.375 G IVPB
3.3750 g | Freq: Three times a day (TID) | INTRAVENOUS | Status: DC
  Administered 2012-09-16 – 2012-09-18 (×6): 3.375 g via INTRAVENOUS
  Filled 2012-09-16 (×8): qty 50

## 2012-09-16 MED ORDER — LEVOTHYROXINE SODIUM 75 MCG PO TABS
75.0000 ug | ORAL_TABLET | Freq: Every day | ORAL | Status: DC
  Filled 2012-09-16: qty 1

## 2012-09-16 MED ORDER — SODIUM CHLORIDE 0.9 % IV SOLN
1000.0000 mL | Freq: Once | INTRAVENOUS | Status: AC
  Administered 2012-09-16: 1000 mL via INTRAVENOUS

## 2012-09-16 MED ORDER — LEVOTHYROXINE SODIUM 100 MCG IV SOLR
37.5000 ug | Freq: Every day | INTRAVENOUS | Status: DC
  Administered 2012-09-17 – 2012-09-18 (×2): 37.5 ug via INTRAVENOUS
  Filled 2012-09-16 (×3): qty 5

## 2012-09-16 MED ORDER — SODIUM CHLORIDE 0.9 % IV SOLN
1000.0000 mL | INTRAVENOUS | Status: DC
  Administered 2012-09-16: 1000 mL via INTRAVENOUS

## 2012-09-16 MED ORDER — SODIUM CHLORIDE 0.9 % IV SOLN
INTRAVENOUS | Status: DC
  Administered 2012-09-16 – 2012-09-18 (×3): via INTRAVENOUS

## 2012-09-16 MED ORDER — PANTOPRAZOLE SODIUM 40 MG IV SOLR
40.0000 mg | INTRAVENOUS | Status: DC
  Administered 2012-09-16: 40 mg via INTRAVENOUS
  Filled 2012-09-16 (×3): qty 40

## 2012-09-16 MED ORDER — SODIUM CHLORIDE 0.9 % IV SOLN: 250.0000 mL | INTRAVENOUS | Status: DC | PRN

## 2012-09-16 MED ORDER — MEMANTINE HCL 5 MG PO TABS
5.0000 mg | ORAL_TABLET | Freq: Two times a day (BID) | ORAL | Status: DC
  Filled 2012-09-16: qty 1

## 2012-09-16 MED ORDER — PIPERACILLIN-TAZOBACTAM 3.375 G IVPB 30 MIN
3.3750 g | Freq: Once | INTRAVENOUS | Status: AC
  Administered 2012-09-16: 3.375 g via INTRAVENOUS
  Filled 2012-09-16: qty 50

## 2012-09-16 MED ORDER — VANCOMYCIN HCL IN DEXTROSE 1-5 GM/200ML-% IV SOLN
1000.0000 mg | Freq: Once | INTRAVENOUS | Status: AC
  Administered 2012-09-16: 1000 mg via INTRAVENOUS
  Filled 2012-09-16: qty 200

## 2012-09-16 MED ORDER — VANCOMYCIN HCL IN DEXTROSE 1-5 GM/200ML-% IV SOLN
1000.0000 mg | INTRAVENOUS | Status: DC
  Administered 2012-09-17: 1000 mg via INTRAVENOUS
  Filled 2012-09-16 (×2): qty 200

## 2012-09-16 NOTE — ED Notes (Signed)
Pt placed on 2liters per min [place].

## 2012-09-16 NOTE — ED Notes (Signed)
RT at bedside to collect ABG 

## 2012-09-16 NOTE — ED Notes (Signed)
Staff member went to pick up plasma.

## 2012-09-16 NOTE — ED Notes (Signed)
Transported pt to CT by this RN.

## 2012-09-16 NOTE — Progress Notes (Signed)
ANTIBIOTIC CONSULT NOTE - INITIAL  Pharmacy Consult for Vancomycin/Zosyn Indication: Sepsis, unknown origin, possibly abdomen  Allergies  Allergen Reactions  . Hyoscyamine   . Ivp Dye (Iodinated Diagnostic Agents)   . Septra (Sulfamethoxazole W-Trimethoprim)     Patient Measurements:   Ht: 65 in Wt: 74.4 kg  Vital Signs: Temp: 101.2 F (38.4 C) (06/09 1327) Temp src: Rectal (06/09 1327) BP: 88/61 mmHg (06/09 1316) Pulse Rate: 118 (06/09 1315) Intake/Output from previous day:   Intake/Output from this shift:    Labs: No results found for this basename: WBC, HGB, PLT, LABCREA, CREATININE,  in the last 72 hours The CrCl is unknown because both a height and weight (above a minimum accepted value) are required for this calculation. No results found for this basename: VANCOTROUGH, VANCOPEAK, VANCORANDOM, GENTTROUGH, GENTPEAK, GENTRANDOM, TOBRATROUGH, TOBRAPEAK, TOBRARND, AMIKACINPEAK, AMIKACINTROU, AMIKACIN,  in the last 72 hours   Microbiology: No results found for this or any previous visit (from the past 720 hour(s)).  Medical History: Past Medical History  Diagnosis Date  . Hyperthyroidism     had RAI-is on thyroid replacement, happened maybe this summer  . Coronary artery disease     Had a catheterization per son by Dr. [name] in 1991 no reports in  [place]  . GERD (gastroesophageal reflux disease)    Assessment: 79 y/o F who [facility] rectum. Tmax 101.2, hypotensive, tachycardic, lactic acid 2.99, WBC 11.9, renal function ok with Scr 1.20, although may be elevated for 79 y/o.   Of note, patient on warfarin + lovenox per nursing home [facility], likely to be held in setting of GI bleed, will follow.   ED Antibiotics Vancomycin 1000 mg IV x 1 Zosyn 3.375g IV x 1  Goal of Therapy:  Vancomycin trough level 15-20 mcg/ml  Plan:  - Vancomycin 1000 mg IV q24h  - Zosyn 3.375G IV q8h to be infused over 4 hours - Trend WBC, temp,  cultures, renal function - Drug levels as indicated  Abran Duke, PharmD Clinical Pharmacist Phone: [phone] Pager: [phone] 09/16/2012 1:50 PM

## 2012-09-16 NOTE — ED Provider Notes (Signed)
History    Level 5- Patient is a level V due to 2 acuity and dementia CSN: [id number]  Arrival date & time 09/16/12  1257   First MD Initiated Contact with Patient 09/16/12 1329      Chief Complaint  Patient presents with  . GI Bleeding    (Consider location/radiation/quality/duration/timing/severity/associated sxs/prior treatment) HPI Visit 79 year old woman sent from [facility] living center who is reportedly having rectal bleeding patient has a history of stroke and is nonverbal at baseline  Nursing reports that patient is warm to touch and had an axillary temp of 102. She is somnolent and we are not clear of her baseline mental status. Past Medical History  Diagnosis Date  . Hyperthyroidism     had RAI-is on thyroid replacement, happened maybe this summer  . Coronary artery disease     Had a catheterization per son by Dr. [name] in 1991 no reports in  [place]  . GERD (gastroesophageal reflux disease)     Past Surgical History  Procedure Laterality Date  . None    . Tee without cardioversion N/A 07/26/2012    Procedure: TRANSESOPHAGEAL ECHOCARDIOGRAM (TEE);  Surgeon: Laurey Morale, MD;  Location: [facility] ENDOSCOPY;  Service: Cardiovascular;  Laterality: N/A;    Family History  Problem Relation Age of Onset  . Acne Mother     died giving birth to 8th kid  . Dementia Father     died 89  . Acne Brother     died 2-3 yrs ago  . Cervical cancer Sister     History  Substance Use Topics  . Smoking status: Never Smoker   . Smokeless tobacco: Never Used  . Alcohol Use: No    OB History   Grav Para Term Preterm Abortions TAB SAB Ect Mult Living                  Review of Systems  Unable to perform ROS   Allergies  Hyoscyamine; Ivp dye; and Septra  Home Medications   Current Outpatient Rx  Name  Route  Sig  Dispense  Refill  . acetaminophen (TYLENOL) 325 MG tablet   Per Tube   Place 650 mg into feeding tube every 4 (four) hours as needed for pain or  fever.         Marland Kitchen albuterol (PROVENTIL) (5 MG/ML) 0.5% nebulizer solution   Nebulization   Take 0.5 mLs (2.5 mg total) by nebulization every 4 (four) hours as needed for wheezing or shortness of breath.   20 mL      . cholecalciferol (VITAMIN D) 1000 UNITS tablet   Per Tube   Place 1 tablet (1,000 Units total) into feeding tube daily.         . cyanocobalamin 500 MCG tablet   Per Tube   Place 500 mcg into feeding tube daily.         Marland Kitchen enoxaparin (LOVENOX) 150 MG/ML injection   Subcutaneous   Inject 130 mg into the skin daily.         . furosemide (LASIX) 10 MG/ML solution   Per Tube   Place 40 mg into feeding tube daily.         Marland Kitchen levothyroxine (SYNTHROID, LEVOTHROID) 75 MCG tablet   Per Tube   Place 1 tablet (75 mcg total) into feeding tube daily before breakfast.         . lisinopril (ZESTRIL) 2.5 MG tablet   Per Tube   Place 1 tablet (2.5  mg total) into feeding tube daily.         . memantine (NAMENDA) 5 MG tablet   Oral   Take 5 mg by mouth 2 (two) times daily.         . Nutritional Supplements (FEEDING SUPPLEMENT, JEVITY 1.5 CAL,) LIQD   Per Tube   Place 1,000 mLs into feeding tube continuous. 71ml/hr; on at 6am off at 2am         . pantoprazole sodium (PROTONIX) 40 mg/20 mL PACK   Per Tube   Place 20 mLs (40 mg total) into feeding tube at bedtime.   30 each      . Protein POWD   Per Tube   Place 1 scoop into feeding tube daily. Mix with water         . simvastatin (ZOCOR) 20 MG tablet   Per Tube   Place 1 tablet (20 mg total) into feeding tube daily at 6 PM.   30 tablet      . warfarin (COUMADIN) 6 MG tablet   Oral   Take 6 mg by mouth daily at 6 PM.         . Water For Irrigation, Sterile (FREE WATER) SOLN   Per Tube   Place 150 mLs into feeding tube every 4 (four) hours.           BP 106/49  Pulse 91  Temp(Src) 100.4 F (38 C) (Rectal)  Resp 18  SpO2 99%  Physical Exam  Nursing note and vitals  reviewed. Constitutional: She appears well-developed and well-nourished.  HENT:  Head: Normocephalic and atraumatic.  Mucous membranes are dry  Eyes: Pupils are equal, round, and reactive to light.  Conjunctivae are pale  Neck: Normal range of motion. Neck supple.  Cardiovascular: Normal rate, regular rhythm and normal heart sounds.   Pulmonary/Chest: Effort normal and breath sounds normal.  Abdominal:  Abdomen is distended with tenderness to palpation and mass palpable up to the umbilicus  Genitourinary:  Patient has active rectal bleeding  Musculoskeletal: Normal range of motion.  No obvious deformities are seen of  Neurological: She exhibits abnormal muscle tone. GCS eye subscore is 2. GCS verbal subscore is 1. GCS motor subscore is 3.  patient with eye opening only to palpation abdomen Patient appears to move all extremities equally Toes are downgoing bilaterally Bilateral patellar reflexes are 2+ bilateral biceps reflexes are 1+ Gen. Muscle tone appears to be decreased  Skin: Skin is warm and dry. No rash noted. There is pallor.    ED Course  Procedures (including critical care time)  Labs Reviewed  CBC WITH DIFFERENTIAL - Abnormal; Notable for the following:    WBC 11.9 (*)    MCH 25.9 (*)    RDW 23.5 (*)    Neutrophils Relative % 84 (*)    Lymphocytes Relative 9 (*)    Neutro Abs 10.0 (*)    All other components within normal limits  COMPREHENSIVE METABOLIC PANEL - Abnormal; Notable for the following:    Glucose, Bld 268 (*)    BUN 56 (*)    Albumin 2.3 (*)    GFR calc non Af Amer 58 (*)    GFR calc Af Amer 67 (*)    All other components within normal limits  URINALYSIS, ROUTINE W REFLEX MICROSCOPIC - Abnormal; Notable for the following:    APPearance TURBID (*)    Hgb urine dipstick LARGE (*)    Protein, ur 100 (*)  Leukocytes, UA LARGE (*)    All other components within normal limits  LACTIC ACID, PLASMA - Abnormal; Notable for the following:    Lactic  Acid, Venous 2.9 (*)    All other components within normal limits  PROTIME-INR - Abnormal; Notable for the following:    Prothrombin Time 30.6 (*)    INR 3.14 (*)    All other components within normal limits  URINE MICROSCOPIC-ADD ON - Abnormal; Notable for the following:    Bacteria, UA FEW (*)    All other components within normal limits  CG4 I-STAT (LACTIC ACID) - Abnormal; Notable for the following:    Lactic Acid, Venous 2.99 (*)    All other components within normal limits  POCT I-STAT, CHEM 8 - Abnormal; Notable for the following:    BUN 69 (*)    Creatinine, Ser 1.20 (*)    Glucose, Bld 280 (*)    Calcium, Ion 1.11 (*)    All other components within normal limits  CULTURE, BLOOD (ROUTINE X 2)  CULTURE, BLOOD (ROUTINE X 2)  URINE CULTURE  PROCALCITONIN  LIPASE, BLOOD  POCT I-STAT TROPONIN I  TYPE AND SCREEN  PREPARE RBC (CROSSMATCH)  PREPARE FRESH FROZEN PLASMA  ABO/RH   Ct Abdomen Pelvis Wo Contrast  09/16/2012   *RADIOLOGY REPORT*  Clinical Data: Abdominal distention, pain, and tenderness.  Rectal bleeding.  Urinary tract infection.  Patient on Coumadin.  CT ABDOMEN AND PELVIS WITHOUT CONTRAST  Technique:  Multidetector CT imaging of the abdomen and pelvis was performed following the standard protocol without intravenous contrast.  Comparison: 03/18/2004  Findings: A large amount of stool is seen particularly in a markedly dilated rectum, suspicious for fecal impaction.  Foley catheter is seen within the bladder which is decompressed. Moderate bilateral renal pelvicaliectasis and ureterectasis is demonstrated, but no obstructing ureteral calculi identified. Multiple pelvic phleboliths noted. No evidence of perinephric inflammatory changes or fluid collections.  Diffuse colonic diverticulosis is again demonstrated, however there is no evidence of diverticulitis.  No evidence of dilated small bowel loops.  Percutaneous gastrostomy tube seen in appropriate position.  Noncontrast  images of the liver, gallbladder, spleen, pancreas, and adrenal glands are unremarkable in appearance.  No soft tissue masses or lymphadenopathy identified.  No suspicious bone lesions identified.  IMPRESSION:  1. Moderate bilateral renal pelvicaliectasis and ureterectasis.  No obstructing etiology identified by CT. 2.  Large amount stool particularly in dilated rectum, suspicious for fecal impaction. 3.  Diverticulosis.  No radiographic evidence of diverticulitis.   Original Report Authenticated By: Myles Rosenthal, M.D.   Dg Chest Port 1 View  09/16/2012   *RADIOLOGY REPORT*  Clinical Data: GI bleed, hypotension  PORTABLE CHEST - 1 VIEW  Comparison: 08/05/2012  Findings: Possible mild patchy opacity in the medial left upper lobe.  Lungs otherwise essentially clear.  No frank interstitial edema. Prior left lower lobe atelectasis / effusion are improved.  No pneumothorax.  Mild cardiomegaly.  IMPRESSION: Possible mild patchy opacity in the medial left upper lobe. PA/lateral chest radiographs are suggested for further evaluation.  Prior left lower lobe atelectasis / effusion are improved.  No frank interstitial edema.   Original Report Authenticated By: Charline Bills, M.D.     No diagnosis found. Results for orders placed during the hospital encounter of 09/16/12  CBC WITH DIFFERENTIAL      Result Value Range   WBC 11.9 (*) 4.0 - 10.5 K/uL   RBC 4.98  3.87 - 5.11 MIL/uL   Hemoglobin 12.9  12.0 - 15.0 g/dL   HCT 16.1  09.6 - 04.5 %   MCV 80.1  78.0 - 100.0 fL   MCH 25.9 (*) 26.0 - 34.0 pg   MCHC 32.3  30.0 - 36.0 g/dL   RDW 40.9 (*) 81.1 - 91.4 %   Platelets 259  150 - 400 K/uL   Neutrophils Relative % 84 (*) 43 - 77 %   Lymphocytes Relative 9 (*) 12 - 46 %   Monocytes Relative 7  3 - 12 %   Eosinophils Relative 0  0 - 5 %   Basophils Relative 0  0 - 1 %   Neutro Abs 10.0 (*) 1.7 - 7.7 K/uL   Lymphs Abs 1.1  0.7 - 4.0 K/uL   Monocytes Absolute 0.8  0.1 - 1.0 K/uL   Eosinophils Absolute 0.0   0.0 - 0.7 K/uL   Basophils Absolute 0.0  0.0 - 0.1 K/uL   RBC Morphology POLYCHROMASIA PRESENT     WBC Morphology INCREASED BANDS (>20% BANDS)    COMPREHENSIVE METABOLIC PANEL      Result Value Range   Sodium 140  135 - 145 mEq/L   Potassium 4.6  3.5 - 5.1 mEq/L   Chloride 100  96 - 112 mEq/L   CO2 27  19 - 32 mEq/L   Glucose, Bld 268 (*) 70 - 99 mg/dL   BUN 56 (*) 6 - 23 mg/dL   Creatinine, Ser 7.82  0.50 - 1.10 mg/dL   Calcium 9.7  8.4 - 95.6 mg/dL   Total Protein 7.2  6.0 - 8.3 g/dL   Albumin 2.3 (*) 3.5 - 5.2 g/dL   AST 28  0 - 37 U/L   ALT 21  0 - 35 U/L   Alkaline Phosphatase 86  39 - 117 U/L   Total Bilirubin 0.5  0.3 - 1.2 mg/dL   GFR calc non Af Amer 58 (*) >90 mL/min   GFR calc Af Amer 67 (*) >90 mL/min  PROCALCITONIN      Result Value Range   Procalcitonin 0.44    URINALYSIS, ROUTINE W REFLEX MICROSCOPIC      Result Value Range   Color, Urine YELLOW  YELLOW   APPearance TURBID (*) CLEAR   Specific Gravity, Urine 1.017  1.005 - 1.030   pH 6.0  5.0 - 8.0   Glucose, UA NEGATIVE  NEGATIVE mg/dL   Hgb urine dipstick LARGE (*) NEGATIVE   Bilirubin Urine NEGATIVE  NEGATIVE   Ketones, ur NEGATIVE  NEGATIVE mg/dL   Protein, ur 213 (*) NEGATIVE mg/dL   Urobilinogen, UA 1.0  0.0 - 1.0 mg/dL   Nitrite NEGATIVE  NEGATIVE   Leukocytes, UA LARGE (*) NEGATIVE  LACTIC ACID, PLASMA      Result Value Range   Lactic Acid, Venous 2.9 (*) 0.5 - 2.2 mmol/L  LIPASE, BLOOD      Result Value Range   Lipase 32  11 - 59 U/L  PROTIME-INR      Result Value Range   Prothrombin Time 30.6 (*) 11.6 - 15.2 seconds   INR 3.14 (*) 0.00 - 1.49  URINE MICROSCOPIC-ADD ON      Result Value Range   WBC, UA TOO NUMEROUS TO COUNT  <3 WBC/hpf   RBC / HPF 3-6  <3 RBC/hpf   Bacteria, UA FEW (*) RARE  CG4 I-STAT (LACTIC ACID)      Result Value Range   Lactic Acid, Venous 2.99 (*) 0.5 - 2.2 mmol/L  POCT I-STAT, CHEM 8      Result Value Range   Sodium 142  135 - 145 mEq/L   Potassium 4.7  3.5 -  5.1 mEq/L   Chloride 104  96 - 112 mEq/L   BUN 69 (*) 6 - 23 mg/dL   Creatinine, Ser 1.61 (*) 0.50 - 1.10 mg/dL   Glucose, Bld 096 (*) 70 - 99 mg/dL   Calcium, Ion 0.45 (*) 1.13 - 1.30 mmol/L   TCO2 35  0 - 100 mmol/L   Hemoglobin 13.6  12.0 - 15.0 g/dL   HCT 40.9  81.1 - 91.4 %  POCT I-STAT TROPONIN I      Result Value Range   Troponin i, poc 0.07  0.00 - 0.08 ng/mL   Comment 3           POCT I-STAT 3, BLOOD GAS (G3+)      Result Value Range   pH, Arterial 7.434  7.350 - 7.450   pCO2 arterial 41.8  35.0 - 45.0 mmHg   pO2, Arterial 74.0 (*) 80.0 - 100.0 mmHg   Bicarbonate 27.8 (*) 20.0 - 24.0 mEq/L   TCO2 29  0 - 100 mmol/L   O2 Saturation 94.0     Acid-Base Excess 3.0 (*) 0.0 - 2.0 mmol/L   Patient temperature 37.9 C     Collection site RADIAL, ALLEN'S TEST ACCEPTABLE     Drawn by Operator     Sample type ARTERIAL    TYPE AND SCREEN      Result Value Range   ABO/RH(D) A POS     Antibody Screen NEG     Sample Expiration 09/19/2012     Unit Number [id number]     Blood Component Type RED CELLS,LR     Unit division 00     Status of Unit ISSUED     Transfusion Status OK TO TRANSFUSE     Crossmatch Result Compatible     Unit Number [id number]     Blood Component Type RED CELLS,LR     Unit division 00     Status of Unit ALLOCATED     Transfusion Status OK TO TRANSFUSE     Crossmatch Result Compatible    PREPARE RBC (CROSSMATCH)      Result Value Range   Order Confirmation ORDER PROCESSED BY BLOOD BANK    PREPARE FRESH FROZEN PLASMA      Result Value Range   Unit Number [id number]     Blood Component Type THAWED PLASMA     Unit division 00     Status of Unit ALLOCATED     Transfusion Status OK TO TRANSFUSE     Unit Number [id number]     Blood Component Type THAWED PLASMA     Unit division 00     Status of Unit ALLOCATED     Transfusion Status OK TO TRANSFUSE    ABO/RH      Result Value Range   ABO/RH(D) A POS      CRITICAL CARE Performed by:  Hilario Quarry Total critical care time: 60 Critical care time was exclusive of separately billable procedures and treating other patients. Critical care was necessary to treat or prevent imminent or life-threatening deterioration. Critical care was time spent personally by me on the following activities: development of treatment plan with patient and/or surrogate as well as nursing, discussions with consultants, evaluation of patient's response to treatment, examination of patient, obtaining history from patient or surrogate, ordering and performing treatments  and interventions, ordering and review of laboratory studies, ordering and review of radiographic studies, pulse oximetry and re-evaluation of patient's condition.   MDM  1- sepsis- patient with probable urine source.  Urine is cultured.  Vancomycin and zosyn given.  Patient with bp 80s-90s with initial increase with 2000 ml ns, but decreased again to [id number].  Third liter infusing.  2- rectal bleeding- patient is anticoagulated on coumadin and has inr elevated at 3.14.  FFP, vit k ordered.   3- altered mental status- likely secondary to #1 but definitive baseline unknown and no family here.  Plan continue critical interventions and may require further neuro assessment.      Patient assessed by Dr. Craige Cotta.    Hilario Quarry, MD 09/16/12 [phone]

## 2012-09-16 NOTE — H&P (Signed)
PULMONARY  / CRITICAL CARE MEDICINE  Name: Jenny Brady MRN: [id number] DOB: 1926-04-02    ADMISSION DATE:  09/16/2012 CONSULTATION DATE:  09/16/2012  REFERRING MD :  Margarita Grizzle  CHIEF COMPLAINT:  Rectal bleeding  BRIEF PATIENT DESCRIPTION:  79 yo female transfer from [place] [facility] with rectal bleeding, urine retention, and fever.  Found to have hypotension in ED.  PCCM asked to assess for admission to ICU.  SIGNIFICANT EVENTS:  STUDIES: 6/09 CT abd/pelvis >> fecal impaction, diffuse colonic diverticulosis, moderate bilateral renal pelvicaliectasis and ureterectasis  LINES / TUBES: PEG 07/29/12 >> Foley 6/09 >>  CULTURES: Blood 6/09 >>  Urine 6/09 >>   ANTIBIOTICS: Vancomycin 6/09 >> Zosyn 6/09 >>   HISTORY OF PRESENT ILLNESS:   Patient is non verbal secondary to CVA.  History obtained from medical record and d/w ED staff.    Pt was noted to have blood in stool at nursing home.  She was sent to ED, and reported to have bright red blood per rectum.  Noted to have tenderness on abdominal palpation by ED staff.  CT abd/pelvis showed fecal impaction.  She was noted by hypotensive and order for emergent blood and IV fluids placed in ED.  She was noted to have fever, and emergent Abx started in ED.  She had urine retention, but had brisk urine outpt after foley placed.  BP improved after 3 liters IV fluid.  Lactic acid 2.9 and procalcitonin only 0.44.  She was in hospital in April 2014 for CVA.  She was noted to have possible mural thrombus on TEE from 07/26/12, and started on coumadin.  Per review of medical records during last hospital stay pt's son requested she be full code.  PAST MEDICAL HISTORY :  Past Medical History  Diagnosis Date  . Hyperthyroidism     had RAI-is on thyroid replacement, happened maybe this summer  . Coronary artery disease     Had a catheterization per son by Dr. [name] in 1991 no reports in  [place]  . GERD (gastroesophageal reflux disease)    . Stroke     Right hemiplegia  . Dementia   . Aphasia   . Chronic systolic heart failure   . Mural thrombus of cardiac apex    Past Surgical History  Procedure Laterality Date  . None    . Tee without cardioversion N/A 07/26/2012    Procedure: TRANSESOPHAGEAL ECHOCARDIOGRAM (TEE);  Surgeon: Laurey Morale, MD;  Location: [facility] ENDOSCOPY;  Service: Cardiovascular;  Laterality: N/A;  . Peg placement  07/29/2012   Prior to Admission medications   Medication Sig Start Date End Date Taking? Authorizing Provider  acetaminophen (TYLENOL) 325 MG tablet Place 650 mg into feeding tube every 4 (four) hours as needed for pain or fever.   Yes Historical Provider, MD  albuterol (PROVENTIL) (5 MG/ML) 0.5% nebulizer solution Take 0.5 mLs (2.5 mg total) by nebulization every 4 (four) hours as needed for wheezing or shortness of breath. 08/06/12  Yes Ripudeep K Rai, MD  cholecalciferol (VITAMIN D) 1000 UNITS tablet Place 1 tablet (1,000 Units total) into feeding tube daily. 08/06/12  Yes Ripudeep Jenna Luo, MD  cyanocobalamin 500 MCG tablet Place 500 mcg into feeding tube daily.   Yes Historical Provider, MD  enoxaparin (LOVENOX) 150 MG/ML injection Inject 130 mg into the skin daily. 08/07/12  Yes Marinda Elk, MD  furosemide (LASIX) 10 MG/ML solution Place 40 mg into feeding tube daily.   Yes Historical Provider, MD  levothyroxine (SYNTHROID, LEVOTHROID) 75 MCG tablet Place 1 tablet (75 mcg total) into feeding tube daily before breakfast. 08/06/12  Yes Ripudeep K Rai, MD  lisinopril (ZESTRIL) 2.5 MG tablet Place 1 tablet (2.5 mg total) into feeding tube daily. 08/06/12  Yes Ripudeep Jenna Luo, MD  memantine (NAMENDA) 5 MG tablet Take 5 mg by mouth 2 (two) times daily. 04/06/11  Yes Kathlen Mody, MD  Nutritional Supplements (FEEDING SUPPLEMENT, JEVITY 1.5 CAL,) LIQD Place 1,000 mLs into feeding tube continuous. 16ml/hr; on at 6am off at 2am   Yes Historical Provider, MD  pantoprazole sodium (PROTONIX) 40 mg/20 mL  PACK Place 20 mLs (40 mg total) into feeding tube at bedtime. 08/06/12  Yes Ripudeep Jenna Luo, MD  Protein POWD Place 1 scoop into feeding tube daily. Mix with water   Yes Historical Provider, MD  simvastatin (ZOCOR) 20 MG tablet Place 1 tablet (20 mg total) into feeding tube daily at 6 PM. 08/06/12  Yes Ripudeep K Rai, MD  warfarin (COUMADIN) 6 MG tablet Take 6 mg by mouth daily at 6 PM.   Yes Historical Provider, MD  Water For Irrigation, Sterile (FREE WATER) SOLN Place 150 mLs into feeding tube every 4 (four) hours. 08/06/12  Yes Ripudeep Jenna Luo, MD   Allergies  Allergen Reactions  . Hyoscyamine   . Ivp Dye (Iodinated Diagnostic Agents)   . Septra (Sulfamethoxazole W-Trimethoprim)     FAMILY HISTORY:  Family History  Problem Relation Age of Onset  . Acne Mother     died giving birth to 8th kid  . Dementia Father     died 53  . Acne Brother     died 2-3 yrs ago  . Cervical cancer Sister    SOCIAL HISTORY:  reports that she has never smoked. She has never used smokeless tobacco. She reports that she does not drink alcohol or use illicit drugs.  REVIEW OF SYSTEMS:   Unable to obtain.  SUBJECTIVE:   VITAL SIGNS: Temp:  [97.3 F (36.3 C)-101.2 F (38.4 C)] 100.2 F (37.9 C) (06/09 1645) Pulse Rate:  [90-122] 104 (06/09 1645) Resp:  [14-29] 15 (06/09 1645) BP: (77-118)/(43-74) 96/61 mmHg (06/09 1645) SpO2:  [92 %-100 %] 98 % (06/09 1645) Weight:  [164 lb 0.4 oz (74.4 kg)] 164 lb 0.4 oz (74.4 kg) (06/09 1630) INTAKE / OUTPUT: Intake/Output     06/08 0701 - 06/09 0700 06/09 0701 - 06/10 0700   Blood  350   Total Intake(mL/kg)  350 (4.7)   Urine (mL/kg/hr)  1000   Total Output   1000   Net   -650         3 liters [place] PHYSICAL EXAMINATION: General: No distress Neuro:  Sleepy, wakes up with stimulation, Lt facial droop, moves left side, no movement right side HEENT:  Pupils reactive, dry oral mucosa, no LAN Cardiovascular:  s1s2 with 2/6 SM Lungs:  Scattered  rhonchi Abdomen:  Soft, mild tenderness on palpation, no guarding/rebound, PEG site clean, decreased bowel sounds, foley in place Rectal: Decreased rectal tone, old/new blood, no stool in rectal vault Musculoskeletal:  No edema Skin:  Warm, no rashes, onychomycosis of toes b/l  LABS:  Recent Labs Lab 09/16/12 1338 09/16/12 1350 09/16/12 1356 09/16/12 1401 09/16/12 1556  HGB 12.9  --  13.6  --   --   WBC 11.9*  --   --   --   --   PLT 259  --   --   --   --  NA 140  --  142  --   --   K 4.6  --  4.7  --   --   CL 100  --  104  --   --   CO2 27  --   --   --   --   GLUCOSE 268*  --  280*  --   --   BUN 56*  --  69*  --   --   CREATININE 0.89  --  1.20*  --   --   CALCIUM 9.7  --   --   --   --   AST 28  --   --   --   --   ALT 21  --   --   --   --   ALKPHOS 86  --   --   --   --   BILITOT 0.5  --   --   --   --   PROT 7.2  --   --   --   --   ALBUMIN 2.3*  --   --   --   --   INR 3.14*  --   --   --   --   LATICACIDVEN  --  2.99*  --  2.9*  --   PROCALCITON 0.44  --   --   --   --   PHART  --   --   --   --  7.434  PCO2ART  --   --   --   --  41.8  PO2ART  --   --   --   --  74.0*   Imaging: Ct Abdomen Pelvis Wo Contrast  09/16/2012   *RADIOLOGY REPORT*  Clinical Data: Abdominal distention, pain, and tenderness.  Rectal bleeding.  Urinary tract infection.  Patient on Coumadin.  CT ABDOMEN AND PELVIS WITHOUT CONTRAST  Technique:  Multidetector CT imaging of the abdomen and pelvis was performed following the standard protocol without intravenous contrast.  Comparison: 03/18/2004  Findings: A large amount of stool is seen particularly in a markedly dilated rectum, suspicious for fecal impaction.  Foley catheter is seen within the bladder which is decompressed. Moderate bilateral renal pelvicaliectasis and ureterectasis is demonstrated, but no obstructing ureteral calculi identified. Multiple pelvic phleboliths noted. No evidence of perinephric inflammatory changes or fluid  collections.  Diffuse colonic diverticulosis is again demonstrated, however there is no evidence of diverticulitis.  No evidence of dilated small bowel loops.  Percutaneous gastrostomy tube seen in appropriate position.  Noncontrast images of the liver, gallbladder, spleen, pancreas, and adrenal glands are unremarkable in appearance.  No soft tissue masses or lymphadenopathy identified.  No suspicious bone lesions identified.  IMPRESSION:  1. Moderate bilateral renal pelvicaliectasis and ureterectasis.  No obstructing etiology identified by CT. 2.  Large amount stool particularly in dilated rectum, suspicious for fecal impaction. 3.  Diverticulosis.  No radiographic evidence of diverticulitis.   Original Report Authenticated By: Myles Rosenthal, M.D.   Dg Chest Port 1 View  09/16/2012   *RADIOLOGY REPORT*  Clinical Data: GI bleed, hypotension  PORTABLE CHEST - 1 VIEW  Comparison: 08/05/2012  Findings: Possible mild patchy opacity in the medial left upper lobe.  Lungs otherwise essentially clear.  No frank interstitial edema. Prior left lower lobe atelectasis / effusion are improved.  No pneumothorax.  Mild cardiomegaly.  IMPRESSION: Possible mild patchy opacity in the medial left upper lobe. PA/lateral chest radiographs are suggested for further evaluation.  Prior left lower lobe atelectasis / effusion are improved.  No frank interstitial edema.   Original Report Authenticated By: Charline Bills, M.D.     ASSESSMENT / PLAN:  PULMONARY A: ?PNA. P:   -f/u CXR -oxygen as needed to keep SpO2 > 92%  CARDIOVASCULAR A: Hypotension >> likely from hypovolemia and sepsis >> improved with volume. Hx of CAD, Systolic CHF. Hx of cardiac thrombus on chronic coumadin. P:  -hold lasix, zestril, simvastatin -monitor fluid balance while getting IV fluids -hold coumadin >> ? If she is appropriate candidate for resumption of coumadin  RENAL A:  Acute renal failure with urine retention likely secondary to fecal  impaction. P:   -keep foley for now -monitor renal fx, urine outpt, electrolytes  GASTROINTESTINAL A: Fecal impaction with rectal bleeding >> looks like mostly old blood. Hx of GERD. Dysphagia s/p PEG. P:   -NPO for now -Protonix for SUP -if bleeding recurs may need to consider GI evaluation  HEMATOLOGIC A:  Acute blood loss anemia >> given PRBC transfusion on ED. Coagulopathy 2nd to coumadin administration >> given FFP in ED. P:  -hold coumadin -f/u CBC, INR -transfuse for Hb < 7 or active bleeding  INFECTIOUS A:  Sepsis likely from UTI vs PNA. P:   -Continue vancomycin, zosyn -f/u culture results  ENDOCRINE A: Hypothyroidism.  P:   -f/u TSH -continue synthroid  NEUROLOGIC A: Hx of CVA, dementia, aphasia. P:   -monitor mental status  DISPOSITION A: Son next of kin >> other family members are applying for guardianship due to son not being reliable as advocate for patient. P: -Pt's family working with lawyer, [name] [phone], to obtain guardianship  Patient does not need admission to ICU.  Will admit to SDU.  Have discussed with Dr. Dolphus Jenny with [facility] who will assume medical care from 6/10, and PCCM sign off.  Coralyn Helling, MD [facility] Pulmonary/Critical Care 09/16/2012, 4:58 PM Pager:  [phone] After 3pm call: [phone]

## 2012-09-16 NOTE — ED Notes (Signed)
Lab finished drawing bld cultures. 

## 2012-09-16 NOTE — ED Notes (Signed)
Attempted to call family with number listed in chart. No family listed at next of kin of info sent from [facility].

## 2012-09-16 NOTE — Consult Note (Signed)
PULMONARY  / CRITICAL CARE MEDICINE  Name: Jenny Brady MRN: [id number] DOB: 1925/04/15    ADMISSION DATE:  09/16/2012 CONSULTATION DATE:  09/16/2012  REFERRING MD :  Margarita Grizzle  CHIEF COMPLAINT:  Rectal bleeding  BRIEF PATIENT DESCRIPTION:  79 yo female transfer from [place] Living with rectal bleeding, urine retention, and fever.  Found to have hypotension in ED.  PCCM asked to assess for admission to ICU.  SIGNIFICANT EVENTS:  STUDIES: 6/09 CT abd/pelvis >> fecal impaction, diffuse colonic diverticulosis, moderate bilateral renal pelvicaliectasis and ureterectasis  LINES / TUBES: PEG 07/29/12 >> Foley 6/09 >>  CULTURES: Blood 6/09 >>  Urine 6/09 >>   ANTIBIOTICS: Vancomycin 6/09 >> Zosyn 6/09 >>   HISTORY OF PRESENT ILLNESS:   Patient is non verbal secondary to CVA.  History obtained from medical record and d/w ED staff.    Pt was noted to have blood in stool at nursing home.  She was sent to ED, and reported to have bright red blood per rectum.  Noted to have tenderness on abdominal palpation by ED staff.  CT abd/pelvis showed fecal impaction.  She was noted by hypotensive and order for emergent blood and IV fluids placed in ED.  She was noted to have fever, and emergent Abx started in ED.  She had urine retention, but had brisk urine outpt after foley placed.  BP improved after 3 liters IV fluid.  Lactic acid 2.9 and procalcitonin only 0.44.  She was in hospital in April 2014 for CVA.  She was noted to have possible mural thrombus on TEE from 07/26/12, and started on coumadin.  Per review of medical records during last hospital stay pt's son requested she be full code.  PAST MEDICAL HISTORY :  Past Medical History  Diagnosis Date  . Hyperthyroidism     had RAI-is on thyroid replacement, happened maybe this summer  . Coronary artery disease     Had a catheterization per son by Dr. [name] in 1991 no reports in  [place]  . GERD (gastroesophageal reflux disease)    . Stroke     Right hemiplegia  . Dementia   . Aphasia   . Chronic systolic heart failure   . Mural thrombus of cardiac apex    Past Surgical History  Procedure Laterality Date  . None    . Tee without cardioversion N/A 07/26/2012    Procedure: TRANSESOPHAGEAL ECHOCARDIOGRAM (TEE);  Surgeon: Laurey Morale, MD;  Location: [facility] ENDOSCOPY;  Service: Cardiovascular;  Laterality: N/A;  . Peg placement  07/29/2012   Prior to Admission medications   Medication Sig Start Date End Date Taking? Authorizing Provider  acetaminophen (TYLENOL) 325 MG tablet Place 650 mg into feeding tube every 4 (four) hours as needed for pain or fever.   Yes Historical Provider, MD  albuterol (PROVENTIL) (5 MG/ML) 0.5% nebulizer solution Take 0.5 mLs (2.5 mg total) by nebulization every 4 (four) hours as needed for wheezing or shortness of breath. 08/06/12  Yes Ripudeep K Rai, MD  cholecalciferol (VITAMIN D) 1000 UNITS tablet Place 1 tablet (1,000 Units total) into feeding tube daily. 08/06/12  Yes Ripudeep Jenna Luo, MD  cyanocobalamin 500 MCG tablet Place 500 mcg into feeding tube daily.   Yes Historical Provider, MD  enoxaparin (LOVENOX) 150 MG/ML injection Inject 130 mg into the skin daily. 08/07/12  Yes Marinda Elk, MD  furosemide (LASIX) 10 MG/ML solution Place 40 mg into feeding tube daily.   Yes Historical Provider, MD  levothyroxine (SYNTHROID, LEVOTHROID) 75 MCG tablet Place 1 tablet (75 mcg total) into feeding tube daily before breakfast. 08/06/12  Yes Ripudeep K Rai, MD  lisinopril (ZESTRIL) 2.5 MG tablet Place 1 tablet (2.5 mg total) into feeding tube daily. 08/06/12  Yes Ripudeep Jenna Luo, MD  memantine (NAMENDA) 5 MG tablet Take 5 mg by mouth 2 (two) times daily. 04/06/11  Yes Kathlen Mody, MD  Nutritional Supplements (FEEDING SUPPLEMENT, JEVITY 1.5 CAL,) LIQD Place 1,000 mLs into feeding tube continuous. 69ml/hr; on at 6am off at 2am   Yes Historical Provider, MD  pantoprazole sodium (PROTONIX) 40 mg/20 mL  PACK Place 20 mLs (40 mg total) into feeding tube at bedtime. 08/06/12  Yes Ripudeep Jenna Luo, MD  Protein POWD Place 1 scoop into feeding tube daily. Mix with water   Yes Historical Provider, MD  simvastatin (ZOCOR) 20 MG tablet Place 1 tablet (20 mg total) into feeding tube daily at 6 PM. 08/06/12  Yes Ripudeep K Rai, MD  warfarin (COUMADIN) 6 MG tablet Take 6 mg by mouth daily at 6 PM.   Yes Historical Provider, MD  Water For Irrigation, Sterile (FREE WATER) SOLN Place 150 mLs into feeding tube every 4 (four) hours. 08/06/12  Yes Ripudeep Jenna Luo, MD   Allergies  Allergen Reactions  . Hyoscyamine   . Ivp Dye (Iodinated Diagnostic Agents)   . Septra (Sulfamethoxazole W-Trimethoprim)     FAMILY HISTORY:  Family History  Problem Relation Age of Onset  . Acne Mother     died giving birth to 8th kid  . Dementia Father     died 66  . Acne Brother     died 2-3 yrs ago  . Cervical cancer Sister    SOCIAL HISTORY:  reports that she has never smoked. She has never used smokeless tobacco. She reports that she does not drink alcohol or use illicit drugs.  REVIEW OF SYSTEMS:   Unable to obtain.  SUBJECTIVE:   VITAL SIGNS: Temp:  [97.3 F (36.3 C)-101.2 F (38.4 C)] 100.2 F (37.9 C) (06/09 1615) Pulse Rate:  [90-122] 108 (06/09 1615) Resp:  [14-29] 14 (06/09 1615) BP: (77-115)/(43-74) 106/57 mmHg (06/09 1615) SpO2:  [92 %-100 %] 97 % (06/09 1615) INTAKE / OUTPUT: Intake/Output     06/08 0701 - 06/09 0700 06/09 0701 - 06/10 0700   Blood  350   Total Intake   350   Urine  1000   Total Output   1000   Net   -650         3 liters [place] PHYSICAL EXAMINATION: General: No distress Neuro:  Sleepy, wakes up with stimulation, Lt facial droop, moves left side, no movement right side HEENT:  Pupils reactive, dry oral mucosa, no LAN Cardiovascular:  s1s2 with 2/6 SM Lungs:  Scattered rhonchi Abdomen:  Soft, mild tenderness on palpation, no guarding/rebound, PEG site clean, decreased  bowel sounds, foley in place Rectal: Decreased rectal tone, old/new blood, no stool in rectal vault Musculoskeletal:  No edema Skin:  Warm, no rashes, onychomycosis of toes b/l  LABS:  Recent Labs Lab 09/16/12 1338 09/16/12 1350 09/16/12 1356 09/16/12 1401 09/16/12 1556  HGB 12.9  --  13.6  --   --   WBC 11.9*  --   --   --   --   PLT 259  --   --   --   --   NA 140  --  142  --   --  K 4.6  --  4.7  --   --   CL 100  --  104  --   --   CO2 27  --   --   --   --   GLUCOSE 268*  --  280*  --   --   BUN 56*  --  69*  --   --   CREATININE 0.89  --  1.20*  --   --   CALCIUM 9.7  --   --   --   --   AST 28  --   --   --   --   ALT 21  --   --   --   --   ALKPHOS 86  --   --   --   --   BILITOT 0.5  --   --   --   --   PROT 7.2  --   --   --   --   ALBUMIN 2.3*  --   --   --   --   INR 3.14*  --   --   --   --   LATICACIDVEN  --  2.99*  --  2.9*  --   PROCALCITON 0.44  --   --   --   --   PHART  --   --   --   --  7.434  PCO2ART  --   --   --   --  41.8  PO2ART  --   --   --   --  74.0*   Imaging: Ct Abdomen Pelvis Wo Contrast  09/16/2012   *RADIOLOGY REPORT*  Clinical Data: Abdominal distention, pain, and tenderness.  Rectal bleeding.  Urinary tract infection.  Patient on Coumadin.  CT ABDOMEN AND PELVIS WITHOUT CONTRAST  Technique:  Multidetector CT imaging of the abdomen and pelvis was performed following the standard protocol without intravenous contrast.  Comparison: 03/18/2004  Findings: A large amount of stool is seen particularly in a markedly dilated rectum, suspicious for fecal impaction.  Foley catheter is seen within the bladder which is decompressed. Moderate bilateral renal pelvicaliectasis and ureterectasis is demonstrated, but no obstructing ureteral calculi identified. Multiple pelvic phleboliths noted. No evidence of perinephric inflammatory changes or fluid collections.  Diffuse colonic diverticulosis is again demonstrated, however there is no evidence of  diverticulitis.  No evidence of dilated small bowel loops.  Percutaneous gastrostomy tube seen in appropriate position.  Noncontrast images of the liver, gallbladder, spleen, pancreas, and adrenal glands are unremarkable in appearance.  No soft tissue masses or lymphadenopathy identified.  No suspicious bone lesions identified.  IMPRESSION:  1. Moderate bilateral renal pelvicaliectasis and ureterectasis.  No obstructing etiology identified by CT. 2.  Large amount stool particularly in dilated rectum, suspicious for fecal impaction. 3.  Diverticulosis.  No radiographic evidence of diverticulitis.   Original Report Authenticated By: Myles Rosenthal, M.D.   Dg Chest Port 1 View  09/16/2012   *RADIOLOGY REPORT*  Clinical Data: GI bleed, hypotension  PORTABLE CHEST - 1 VIEW  Comparison: 08/05/2012  Findings: Possible mild patchy opacity in the medial left upper lobe.  Lungs otherwise essentially clear.  No frank interstitial edema. Prior left lower lobe atelectasis / effusion are improved.  No pneumothorax.  Mild cardiomegaly.  IMPRESSION: Possible mild patchy opacity in the medial left upper lobe. PA/lateral chest radiographs are suggested for further evaluation.  Prior left lower lobe atelectasis / effusion are improved.  No frank interstitial  edema.   Original Report Authenticated By: Charline Bills, M.D.     ASSESSMENT / PLAN:  PULMONARY A: ?PNA. P:   -f/u CXR -oxygen as needed to keep SpO2 > 92%  CARDIOVASCULAR A: Hypotension >> likely from hypovolemia and sepsis >> improved with volume. Hx of CAD, Systolic CHF. Hx of cardiac thrombus on chronic coumadin. P:  -hold lasix, zestril, simvastatin -monitor fluid balance while getting IV fluids -hold coumadin >> ? If she is appropriate candidate for resumption of coumadin  RENAL A:  Acute renal failure with urine retention likely secondary to fecal impaction. P:   -keep foley for now -monitor renal fx, urine outpt,  electrolytes  GASTROINTESTINAL A: Fecal impaction with rectal bleeding >> looks like mostly old blood. Hx of GERD. Dysphagia s/p PEG. P:   -NPO for now -Protonix for SUP -if bleeding recurs may need to consider GI evaluation  HEMATOLOGIC A:  Acute blood loss anemia >> given PRBC transfusion on ED. Coagulopathy 2nd to coumadin administration >> given FFP in ED. P:  -hold coumadin -f/u CBC, INR -transfuse for Hb < 7 or active bleeding  INFECTIOUS A:  Sepsis likely from UTI vs PNA. P:   -Continue vancomycin, zosyn -f/u culture results  ENDOCRINE A: Hypothyroidism.  P:   -f/u TSH -continue synthroid  NEUROLOGIC A: Hx of CVA, dementia, aphasia. P:   -monitor mental status  DISPOSITION A: Son next of kin >> other family members are applying for guardianship due to son not being reliable as advocate for patient. P: -Pt's family working with lawyer, [name] [phone], to obtain guardianship  Patient does not need admission to ICU.  Will admit to SDU.  Have discussed with Dr. Dolphus Jenny with [facility] who will assume medical care from 6/10, and PCCM sign off.  Coralyn Helling, MD [facility] Pulmonary/Critical Care 09/16/2012, 4:45 PM Pager:  [phone] After 3pm call: [phone]

## 2012-09-16 NOTE — ED Notes (Signed)
Lab at bedside

## 2012-09-16 NOTE — ED Notes (Signed)
Spoke with Child psychotherapist from [employer], guardian ad litem of pt is [name], a Clinical research associate.  Attempted to contact guardian.  Phone call went to voicemail.

## 2012-09-16 NOTE — ED Notes (Signed)
Pt has bright red blood coming out of rectum.

## 2012-09-16 NOTE — ED Notes (Signed)
Results of lactic acid given to attending EMT Darrel, who in turn will tell Dr. Rosalia Hammers

## 2012-09-16 NOTE — ED Notes (Signed)
Pt unable to speak for consent. MD aware. No family at bedside, attempted to call, unable to get in contact with them. MD wants to go ahead and administer bld products.

## 2012-09-16 NOTE — ED Notes (Signed)
Transported pt back to room from radiology by this RN.

## 2012-09-16 NOTE — ED Notes (Signed)
Per EMS - pt coming from [facility], staff noticed blood from rectum this morning, abd rigid, pt has hx of stroke with right sided weakness and inablitly to speak. BP 90 palpated, HR 120 ST, 95% on room air. Attempted to gain IV access, unsuccessful.

## 2012-09-17 ENCOUNTER — Inpatient Hospital Stay (HOSPITAL_COMMUNITY): Payer: Medicare Other

## 2012-09-17 ENCOUNTER — Encounter (HOSPITAL_COMMUNITY): Payer: Self-pay | Admitting: Physician Assistant

## 2012-09-17 DIAGNOSIS — I513 Intracardiac thrombosis, not elsewhere classified: Secondary | ICD-10-CM | POA: Diagnosis present

## 2012-09-17 DIAGNOSIS — K5641 Fecal impaction: Secondary | ICD-10-CM | POA: Diagnosis present

## 2012-09-17 DIAGNOSIS — J9601 Acute respiratory failure with hypoxia: Secondary | ICD-10-CM | POA: Diagnosis present

## 2012-09-17 DIAGNOSIS — D638 Anemia in other chronic diseases classified elsewhere: Secondary | ICD-10-CM | POA: Diagnosis present

## 2012-09-17 LAB — BASIC METABOLIC PANEL
Chloride: 112 mEq/L (ref 96–112)
Creatinine, Ser: 0.5 mg/dL (ref 0.50–1.10)
GFR calc Af Amer: >90 mL/min (ref >90)
Potassium: 3 mEq/L — ABNORMAL LOW (ref 3.5–5.1)
Sodium: 149 mEq/L — ABNORMAL HIGH (ref 135–145)

## 2012-09-17 LAB — TYPE AND SCREEN

## 2012-09-17 LAB — GLUCOSE, CAPILLARY
Glucose-Capillary: 108 mg/dL — ABNORMAL HIGH (ref 70–99)
Glucose-Capillary: 126 mg/dL — ABNORMAL HIGH (ref 70–99)
Glucose-Capillary: 130 mg/dL — ABNORMAL HIGH (ref 70–99)
Glucose-Capillary: 94 mg/dL (ref 70–99)
Glucose-Capillary: 97 mg/dL (ref 70–99)

## 2012-09-17 LAB — CBC
HCT: 34.1 % — ABNORMAL LOW (ref 36.0–46.0)
Hemoglobin: 10.9 g/dL — ABNORMAL LOW (ref 12.0–15.0)
Hemoglobin: 10.6 g/dL — ABNORMAL LOW (ref 12.0–15.0)
MCH: 26.4 pg (ref 26.0–34.0)
MCHC: 32.1 g/dL (ref 30.0–36.0)
MCV: 82 fL (ref 78.0–100.0)
MCV: 83 fL (ref 78.0–100.0)
MCV: 82.3 fL (ref 78.0–100.0)
Platelets: 140 10*3/uL — ABNORMAL LOW (ref 150–400)
RBC: 3.66 MIL/uL — ABNORMAL LOW (ref 3.87–5.11)
RBC: 4.01 MIL/uL (ref 3.87–5.11)
RDW: 21.5 % — ABNORMAL HIGH (ref 11.5–15.5)
RDW: 21.5 % — ABNORMAL HIGH (ref 11.5–15.5)
WBC: 8.8 10*3/uL (ref 4.0–10.5)
WBC: 9 10*3/uL (ref 4.0–10.5)

## 2012-09-17 LAB — PHOSPHORUS: Phosphorus: 3.7 mg/dL (ref 2.3–4.6)

## 2012-09-17 LAB — MAGNESIUM: Magnesium: 2 mg/dL (ref 1.5–2.5)

## 2012-09-17 LAB — PREPARE FRESH FROZEN PLASMA: Unit division: 0

## 2012-09-17 LAB — PROTIME-INR
INR: 1.42 (ref 0.00–1.49)
Prothrombin Time: 17 seconds — ABNORMAL HIGH (ref 11.6–15.2)

## 2012-09-17 MED ORDER — FLEET ENEMA 7-19 GM/118ML RE ENEM
1.0000 | ENEMA | Freq: Once | RECTAL | Status: DC
  Filled 2012-09-17: qty 1

## 2012-09-17 MED ORDER — POLYETHYLENE GLYCOL 3350 17 GM/SCOOP PO POWD
0.5000 | Freq: Once | ORAL | Status: AC
  Administered 2012-09-17: 0.5 via ORAL
  Filled 2012-09-17: qty 255

## 2012-09-17 MED ORDER — BISACODYL 10 MG RE SUPP
10.0000 mg | Freq: Once | RECTAL | Status: AC
  Administered 2012-09-17: 10 mg via RECTAL
  Filled 2012-09-17: qty 1

## 2012-09-17 NOTE — Progress Notes (Signed)
Clinical Social Work Department BRIEF PSYCHOSOCIAL ASSESSMENT 09/17/2012  Patient:  Jenny Brady, Jenny Brady     Account Number:  [id number]     Admit date:  09/16/2012  Clinical Social Worker:  Jenny Brady  Date/Time:  09/17/2012 04:19 PM  Referred by:  Physician  Date Referred:  09/17/2012 Referred for  Other - See comment   Other Referral:   Locating HCPOA   Interview type:  Family Other interview type:   Pt not oriented at this time.    PSYCHOSOCIAL DATA Living Status:  FACILITY Admitted from facility:  [facility], [place] Level of care:  Skilled Nursing Facility Primary support name:  Jenny Brady: [phone] Primary support relationship to patient:  FAMILY Degree of support available:   Adequate.    CURRENT CONCERNS Current Concerns  Post-Acute Placement  Other - See comment   Other Concerns:   Locating HCPOA.    SOCIAL WORK ASSESSMENT / PLAN Clinical Social Worker received referral from MD requesting assistance with locating HCPOA. CSW reviewed chart and staffed case with Chiropodist.  Pt has a court date scheduled for 09/26/12 to determine if pt is competent. Pt's Attorney Guardian ad Litem is Jenny Brady: [phone].  Per [name], several attempts have been made to get in contact with pt's son, Jenny Brady, to no avail; Well checks via [employer], Letters mailed to his home, and calls made to son's phone number.    Per chart review and discussion with Chiropodist, CSW department had difficulty getting in touch with pt's son and pt's niece, Jenny Brady became contact person for pt; assisting with pt's placement at [facility].    [facility] filed for a motion to determine pt's competency and, if decided pt is incompetent, to have a person identified to assist with pt's decision making.    CSW attempted to phone pt's son-voicemail message was in [place]. CSW left voicemail message in both [place] and  [place] requesting a return call.  Jenny Brady provided son's home number [phone]).  When CSW attempted to phone this number, a message stated this number is not accepting phone calls.    CSW spoke with Jenny Brady who stated she is currently out of town and requested Jenny Brady be added to list to allow Jenny Brady to receive updates as well.      CSW updated Chiropodist and MD.   Assessment/plan status:  Psychosocial Support/Ongoing Assessment of Needs Other assessment/ plan:   Information/referral to community resources:   Attorney Guardian Ad Litem  SNF    PATIENT'S/FAMILY'S RESPONSE TO PLAN OF CARE: Jenny Brady was engaged in conversation and thanked CSW for intervention.       Jenny Brady, MSW, [place] [phone]

## 2012-09-17 NOTE — Progress Notes (Signed)
[facility] Progress Note [place] TEAM 1 - Stepdown/ICU TEAM   Kamariyah Timberlake [place] JYN:[id number] DOB: [birth year] DOA: 09/16/2012 PCP: [name], MD  Brief narrative: 79 year old female patient resident of [facility] living nursing facility. Since the ER because of rectal bleeding and fever and urinary retention. She was found to be hypotensive in the emergency department. She was subsequently admitted to the ICU by critical care medicine. CT of the abdomen and pelvis did reveal fecal impaction and diffuse colonic diverticulosis as well as moderate bilateral renal pelvocaliectasis ureterectasis. She was also somewhat hypoxemic and felt to have hospital acquired pneumonia and has been placed on broad-spectrum antibiotics. She was also felt to be dehydrated as evidenced by elevated serum lactate, azotemia and mild acute renal insufficiency. Patient has baseline normal renal function. She was hydrated. She has received 2 units of packed red blood cells. She was on Coumadin prior to admission for suspected mural thrombus so she was also given 2 units of FFP to reverse her INR (3.14) in the setting of lower GI bleeding. She has stabilized from a hemodynamic standpoint and was appropriate to transfer to the step down unit. Team 1 assumed care of this patient on 09/17/2012.  Assessment/Plan: Active Problems:  Sepsis/ HYPOTENSION -BP at baseline given chronic systolic dysfunction -source unclear but likely UTI -NPO so continue gentle IVF -cont to hold anti-HTN agents    Acute respiratory failure with hypoxia due to:   A) HCAP (healthcare-associated pneumonia) -only atlx seen on CXR so doubt PNA and hypoxia likely due to acute encephalopathy in setting of recent CVA/dementia -wean O2 as tolerated    Enterococcus UTI (lower urinary tract infection)  sensitivities pending -cont empiric anbx's   ? Bacteremia -RN called said lab just called with 1/2 blood cx positive for GPC's?- follow  up    Rectal bleed/history of GERD (gastroesophageal reflux disease)/obstipation -Coumadin on hold -has had recurrent melena x 3 since admit -[facility] consulted -abdomen is tender-KUB today rectum and distal sigmoid colon distended with stool and moderate stool throughout remainder of colon -give enema and dulcolax supp -mild azotemia -on PPI pre admit      Coronary artery disease -stable- see below re: ECHO/RWMA     CKD (chronic kidney disease) stage 3, GFR 30-59 ml/min -renal function stable with good UOP    H/O ischemic left MCA stroke -April 2014 -residual right hemiparesis and now unable to communicate in [place]- only in [place] -PT/OT when more stable    Chronic systolic congestive heart failure, NYHA class 3/RHF chronic -compensated -EF 20%-25% with diffuse hypokinesis -Lasix/ACE I on hold due to recent DH/hypotension- given dementia and suspected poor PO intake chronically may not be great candidate for scheduled Lasix and instead would be best served by weight based dosing    Anemia of chronic disease -admit Hgb 12 in setting of DH -post 2 ubits PRBC since admit -Hgb has drifted down to 9.7 since admit- combo dilutional and blood loss    Moderate pulmonary HTN/Moderate TR -see above re: RHF    Hyperthyroidism-s/p RAI admin -cont IV Synthroid    Dementia with behavioral disturbance/Dysphagia, unspecified(787.20) -sx's exacerbated by recent CVA -once GI eval complete will need swallow eval before resume PO's -has PEG if needed    Long term (current) use of anticoagulants/Mural thrombus of heart -started last admit -need to clarify GIB before consider resume Coumadin -may not be best candidate for anti-coagulation long term -pending results GI eval may need Cardiology to comment on  use of Coumadin   DVT prophylaxis: SCDs Code Status: Full Family Communication: Son has hour of attorney but other family members are applying for guardianship do to son not being  reliable as advocate for patient/apparent alcoholic-patient's family is working with lawyer [name] [phone] to obtain guardianship-all medical questions intended for family as well as any need for consent for procedures or treatments need to be directed to [name] Disposition Plan: Stepdown Isolation: None Nutritional Status: That acute on chronic protein calorie malnutrition inpatient with acute failure to thrive related to dehydration and presumed infectious process as well as chronic failure to thrive related to dementia and dysphagia  Consultants: Gastroenterology  Procedures: None  Antibiotics: Zosyn 6/9 >>> Vancomycin 6/9 >>>  HPI/Subjective: Patient awake but not really making eye contact. Further complicated by patient's apparent lack of understanding of English language since her stroke. Did exhibit signs of pain abdomen palpated.   Objective: Blood pressure 91/35, pulse 75, temperature 97.8 F (36.6 C), temperature source Core (Comment), resp. rate 16, height 5' 4.96" (1.65 m), weight 75.9 kg (167 lb 5.3 oz), SpO2 91.00%.  Intake/Output Summary (Last 24 hours) at 09/17/12 0941 Last data filed at 09/17/12 0900  Gross per 24 hour  Intake   1933 ml  Output   2500 ml  Net   -567 ml     Exam: General: No acute respiratory distress Lungs: Clear to auscultation bilaterally without wheezes or crackles, RA Cardiovascular: Regular rate and rhythm without murmur gallop or rub normal S1 and S2, no peripheral edema or JVD Abdomen: Diffuse tender, nondistended, soft, hypoactive bowel sounds positive, no rebound, no ascites, no appreciable mass-RN reports melena, PEG tube Musculoskeletal: No significant cyanosis, clubbing of bilateral lower extremities Neurological: Awake without eye contact, very weak spontaneous movement of left side, noted right-sided hemiplegia, no apparent facial drooping, appears nonverbal but this may be limited by patient's lack of understanding of  English language  Scheduled Meds: Scheduled Meds: . levothyroxine  37.5 mcg Intravenous QAC breakfast  . pantoprazole (PROTONIX) IV  40 mg Intravenous Q24H  . piperacillin-tazobactam (ZOSYN)  IV  3.375 g Intravenous Q8H  . vancomycin  1,000 mg Intravenous Q24H   Continuous Infusions: . sodium chloride 50 mL/hr at 09/16/12 1807    Data Reviewed: Basic Metabolic Panel:  Recent Labs Lab 09/16/12 1338 09/16/12 1356 09/17/12 0545  NA 140 142 149*  K 4.6 4.7 3.0*  CL 100 104 112  CO2 27  --  30  GLUCOSE 268* 280* 137*  BUN 56* 69* 31*  CREATININE 0.89 1.20* 0.50  CALCIUM 9.7  --  8.5  MG  --   --  2.0  PHOS  --   --  3.7   Liver Function Tests:  Recent Labs Lab 09/16/12 1338  AST 28  ALT 21  ALKPHOS 86  BILITOT 0.5  PROT 7.2  ALBUMIN 2.3*    Recent Labs Lab 09/16/12 1338  LIPASE 32   No results found for this basename: AMMONIA,  in the last 168 hours CBC:  Recent Labs Lab 09/16/12 1338 09/16/12 1356 09/17/12 0117 09/17/12 0545  WBC 11.9*  --  8.8 8.8  NEUTROABS 10.0*  --   --   --   HGB 12.9 13.6 10.2* 9.7*  HCT 39.9 40.0 31.7* 30.0*  MCV 80.1  --  82.3 82.0  PLT 259  --  164 140*   Cardiac Enzymes:  Recent Labs Lab 09/17/12 0117  TROPONINI <0.30   BNP (last 3 results)  Recent Labs  07/21/12 1635  PROBNP 9584.0*   CBG:  Recent Labs Lab 09/16/12 1713 09/16/12 1948 09/16/12 2355 09/17/12 0421 09/17/12 0736  GLUCAP 171* 138* 126* 140* 130*    Recent Results (from the past 240 hour(s))  CULTURE, BLOOD (ROUTINE X 2)     Status: None   Collection Time    09/16/12  1:45 PM      Result Value Range Status   Specimen Description BLOOD ARM LEFT   Final   Special Requests BOTTLES DRAWN AEROBIC AND ANAEROBIC 10CC   Final   Culture  Setup Time 09/16/2012 16:39   Final   Culture     Final   Value:        BLOOD CULTURE RECEIVED NO GROWTH TO DATE CULTURE WILL BE HELD FOR 5 DAYS BEFORE ISSUING A FINAL NEGATIVE REPORT   Report Status  PENDING   Incomplete  CULTURE, BLOOD (ROUTINE X 2)     Status: None   Collection Time    09/16/12  2:10 PM      Result Value Range Status   Specimen Description BLOOD ARM LEFT   Final   Special Requests BOTTLES DRAWN AEROBIC AND ANAEROBIC 10CC   Final   Culture  Setup Time 09/16/2012 16:39   Final   Culture     Final   Value:        BLOOD CULTURE RECEIVED NO GROWTH TO DATE CULTURE WILL BE HELD FOR 5 DAYS BEFORE ISSUING A FINAL NEGATIVE REPORT   Report Status PENDING   Incomplete  URINE CULTURE     Status: None   Collection Time    09/16/12  2:27 PM      Result Value Range Status   Specimen Description URINE, CATHETERIZED   Final   Special Requests NONE   Final   Culture  Setup Time 09/16/2012 14:47   Final   Colony Count >=100,000 COLONIES/ML   Final   Culture ENTEROCOCCUS SPECIES   Final   Report Status PENDING   Incomplete  MRSA PCR SCREENING     Status: None   Collection Time    09/16/12  5:13 PM      Result Value Range Status   MRSA by PCR NEGATIVE  NEGATIVE Final   Comment:            The GeneXpert MRSA Assay (FDA     approved for NASAL specimens     only), is one component of a     comprehensive MRSA colonization     surveillance program. It is not     intended to diagnose MRSA     infection nor to guide or     monitor treatment for     MRSA infections.     Studies:  Recent x-ray studies have been reviewed in detail by the Attending Physician  Scheduled Meds:  Reviewed in detail by the Attending Physician Patient seen and examined . I have assessed and evaluated underlying  issues and agrees with the plan.   Junious Silk, ANP [facility] Office  [phone] Pager [phone]  **If unable to reach the above provider after paging please contact the Flow Manager @ [phone]  On-Call/Text Page:      [email or web address]      password [email or web address]  If 7PM-7AM, please contact night-coverage [email or web address] Password [email or web address] 09/17/2012, 9:41 AM   LOS: 1 day

## 2012-09-17 NOTE — Progress Notes (Signed)
Upon initial floor assessment nurse found pt's right hand  To be infiltrated with NS IVF infusing.  Nurse removed IV and contacted IV team to gain clarity regarding correct intervention.  Pt had previously received Zosyn and this information was communicated to IV nurse.  Per IV nurse pt's arm should be elevated, without heat/cold treatment at this time.  Nurse will elevate arm and continue to monitor

## 2012-09-17 NOTE — Progress Notes (Signed)
Transferred pt to 2609 via bed, pt placed on 2600 telemetry monitor, VSS, NAD, receiving RN at bedside

## 2012-09-17 NOTE — Progress Notes (Signed)
CRITICAL VALUE ALERT  Critical value received:  Blood Culture, anaerobic vial drawn on 6/9, gram + cocci in clusters    Date of notification:  09/17/12  Time of notification:  1240  Critical value read back:    yes  Nurse who received alert:  Kemper Durie  MD notified (1st page):  Junious Silk, NP   Time of first page:  1318  MD notified (2nd page):  Time of second page:  Responding MD:  Junious Silk, NP  Time MD responded:  1320

## 2012-09-17 NOTE — Consult Note (Signed)
[facility] Consult: 11:17 AM 09/17/2012   Referring Provider: Dr Susie Cassette.  Primary Care Physician:  [name], MD Primary Gastroenterologist:  apperently Dr Arlyce Dice.  No GI notes found in Epic   Reason for Consultation:  Rectal bleeding.  Normocytic anemia  HPI: Jenny Brady is a 79 y.o. female.  Pt with baseline severe, vascular dementia, CHF.  Suffered left MCA CVA in 07/2012 and started on Coumadin.  TEE revealed mural thrombus.  She was treated for (aspiration) PNA  Radiology placed feeding tube 4/25 as she was not meeting dietary needs on her puree/pudding thick liquid diet.  Also has aphasia and has lost ability to understand [place], so communication is in her native [place] tongue. Son refused palliative care consult, she discharged to SNF.  There is legal wrangling going on between pt's son and nieces.  The nieces feel son, an alcoholic, has made poor decisions on behalf of his mom.  At this point and attorney has been appointed as [name] with fever, urinary retention (brisk output after foley placed) and rectal bleeding. The duration and circumstances surrounding the bleeding are not conveyed in notes from SNF.  CT scan shows fecal impaction, diverticulosis. Micro confirms enterococcal UTI.  INR was 3.1, it is 1.4 today post FFP x2. BUN/creat 69/1.2, corrected to 31/0.5 with hydration including 2 units of PRBCs when Hgb went from 12.9-13.6 down to 10.2.  Historic Hgb is around 10.5.  Her MCV had been around 75: low, during previous hospitalization. No iron studies in Epic. TSH is normal.  She received 2 units of blood, 2 of FFP on 6/9. No vitamin K was given.      Past Medical History  Diagnosis Date  . Hyperthyroidism     had RAI-is on thyroid replacement, happened maybe this summer  . Coronary artery disease ?, before 2005    Had a catheterization per son by Dr. [name] in 1991  no reports in  [place].  note from 2005 mentions hx of coronary stent  . GERD (gastroesophageal reflux disease)   . Stroke 07/2012    Right hemiplegia, dysphagia, aphasia.  . Dementia   . Aphasia 07/2012  . Chronic systolic heart failure     07/2012 the EF was 15%  . Mural thrombus of cardiac apex   . Obesity   . IBS (irritable bowel syndrome)   . DVT (deep venous thrombosis) before 2005    occurred after trauma, treated for a while with Coumadin.   Marland Kitchen Dysphagia due to recent stroke 07/2012    ok'd for purree/pudding thick liquids but G tube placed by IR  . OSA (obstructive sleep apnea)     refused CPAP per notes.     Past Surgical History  Procedure Laterality Date  . Tee without cardioversion N/A 07/26/2012    Procedure: TRANSESOPHAGEAL ECHOCARDIOGRAM (TEE);  Surgeon: Laurey Morale, MD;  Location: [facility] ENDOSCOPY;  Service: Cardiovascular;  Laterality: N/A;  . Gastric feeding tube  08/02/2012    placed in radiology    Prior to Admission medications   Medication Sig Start Date End Date Taking? Authorizing Provider  acetaminophen (TYLENOL) 325 MG tablet Place 650 mg into feeding tube every 4 (four) hours as needed for pain or fever.   Yes Historical Provider, MD  albuterol (PROVENTIL) (5 MG/ML) 0.5% nebulizer solution Take 0.5 mLs (2.5 mg total) by nebulization every 4 (four) hours as needed for wheezing or shortness of breath. 08/06/12  Yes Ripudeep Jenna Luo, MD  cholecalciferol (VITAMIN D) 1000 UNITS tablet Place 1 tablet (1,000 Units total) into feeding tube daily. 08/06/12  Yes Ripudeep Jenna Luo, MD  cyanocobalamin 500 MCG tablet Place 500 mcg into feeding tube daily.   Yes Historical Provider, MD  enoxaparin (LOVENOX) 150 MG/ML injection Inject 130 mg into the skin daily. 08/07/12  Yes Marinda Elk, MD  furosemide (LASIX) 10 MG/ML solution Place 40 mg into feeding tube daily.   Yes Historical Provider, MD  levothyroxine (SYNTHROID, LEVOTHROID) 75 MCG tablet Place 1 tablet (75 mcg total)  into feeding tube daily before breakfast. 08/06/12  Yes Ripudeep K Rai, MD  lisinopril (ZESTRIL) 2.5 MG tablet Place 1 tablet (2.5 mg total) into feeding tube daily. 08/06/12  Yes Ripudeep Jenna Luo, MD  memantine (NAMENDA) 5 MG tablet Take 5 mg by mouth 2 (two) times daily. 04/06/11  Yes Kathlen Mody, MD  Nutritional Supplements (FEEDING SUPPLEMENT, JEVITY 1.5 CAL,) LIQD Place 1,000 mLs into feeding tube continuous. 26ml/hr; on at 6am off at 2am   Yes Historical Provider, MD  pantoprazole sodium (PROTONIX) 40 mg/20 mL PACK Place 20 mLs (40 mg total) into feeding tube at bedtime. 08/06/12  Yes Ripudeep Jenna Luo, MD  Protein POWD Place 1 scoop into feeding tube daily. Mix with water   Yes Historical Provider, MD  simvastatin (ZOCOR) 20 MG tablet Place 1 tablet (20 mg total) into feeding tube daily at 6 PM. 08/06/12  Yes Ripudeep K Rai, MD  warfarin (COUMADIN) 6 MG tablet Take 6 mg by mouth daily at 6 PM.   Yes Historical Provider, MD  Water For Irrigation, Sterile (FREE WATER) SOLN Place 150 mLs into feeding tube every 4 (four) hours. 08/06/12  Yes Ripudeep Jenna Luo, MD    Scheduled Meds: . levothyroxine  37.5 mcg Intravenous QAC breakfast  . piperacillin-tazobactam (ZOSYN)  IV  3.375 g Intravenous Q8H  . polyethylene glycol powder  0.5 Container Oral Once  . sodium phosphate  1 enema Rectal Once  . vancomycin  1,000 mg Intravenous Q24H   Infusions: . sodium chloride 50 mL/hr at 09/16/12 1807   PRN Meds: sodium chloride, albuterol   Allergies as of 09/16/2012 - Review Complete 09/16/2012  Allergen Reaction Noted  . Hyoscyamine    . Ivp dye (iodinated diagnostic agents)  03/26/2011  . Septra (sulfamethoxazole w-trimethoprim)  07/21/2012    Family History  Problem Relation Age of Onset  . Acne Mother     died giving birth to 8th kid  . Dementia Father     died 81  . Acne Brother     died 2-3 yrs ago  . Cervical cancer Sister     History   Social History  . Marital Status: Widowed     Spouse Name: N/A    Number of Children: N/A  . Years of Education: N/A   Occupational History  . Not on file.   Social History Main Topics  . Smoking status: Never Smoker   . Smokeless tobacco: Never Used  . Alcohol Use: No  . Drug Use: No  . Sexually Active:    Other Topics Concern  . Not on file   Social History Narrative   [phone] Jenny Brady, NOK-living with pt until she went to SNF.  He is described as being "heavily intoxicated" on the H& P of 4/13.  Son refused palliative care consult in April 2014.      REVIEW OF SYSTEMS: Constitutional:  Not able  to assess ENT:  No nose bleeds Pulm:  No cough, no acute dyspnea CV:  No palpitations or chest pain GU:  No blood in urine GI:  Tube feeds at discharge in 07/2012:  Jevity 1.2 at 60 CC/hour with 150 ml H2O flush every 6 hours.   Heme:  No records of treatment for anemia.    Transfusions:  None mentioned until this admission Neuro:  Gait disorder for many years.  Derm:  No sores or rash Endocrine:  Unable to assess Immunization:  Immunization status unknown Travel:  none   PHYSICAL EXAM: Vital signs in last 24 hours: Temp:  [97.3 F (36.3 C)-101.2 F (38.4 C)] 98.1 F (36.7 C) (06/10 1100) Pulse Rate:  [69-122] 83 (06/10 1100) Resp:  [12-29] 22 (06/10 1100) BP: (77-120)/(30-85) 104/40 mmHg (06/10 1100) SpO2:  [91 %-100 %] 100 % (06/10 1100) Weight:  [74.4 kg (164 lb 0.4 oz)-75.9 kg (167 lb 5.3 oz)] 75.9 kg (167 lb 5.3 oz) (06/10 0500)  General: obese WF. Does not look ill but unable to speak, mostly just moans Head:  No signs of trauma.  No facial edema.   Eyes:  No icterus or pallor Ears:  Unable to assess hearing.  Nose:  No discharge or congestion.  Mouth:  Dental caries.  No oral blood or CG material.  Mouth breathing.  Lips symmetric.  Neck:  No mass, no TMG, no bruits Lungs:  Clear B Heart: RRR.  No MRG Abdomen:  Obese, soft, ? Tender on left (pt moans frequently when you touch her in  several locations).  Peg in upper right to mid abdomen is benign appearing and not leaking.    Rectal: copious soft brown stool mixed with fresh blood emerging from and within rectum.  No mass, no visible or palpable memorrhoids   Musc/Skeltl: no joint erythema.   Extremities:  No pedal edema  Neurologic:  Responds to exam, non-verbal.  Skin:  No decubitus on back side or on heals.  No telangectasia Tattoos:  none Nodes:  No cervical adenopathy.    Psych:  Not able to assess  Intake/Output from previous day: 06/09 0701 - 06/10 0700 In: 1908 [I.V.:650; Blood:1195.5; IV Piggyback:62.5] Out: 2500 [Urine:2500] Intake/Output this shift: Total I/O In: 87.5 [I.V.:50; IV Piggyback:37.5] Out: 125 [Urine:125]  LAB RESULTS:  Recent Labs  09/16/12 1338 09/16/12 1356 09/17/12 0117 09/17/12 0545  WBC 11.9*  --  8.8 8.8  HGB 12.9 13.6 10.2* 9.7*  HCT 39.9 40.0 31.7* 30.0*  PLT 259  --  164 140*   BMET Lab Results  Component Value Date   NA 149* 09/17/2012   NA 142 09/16/2012   NA 140 09/16/2012   K 3.0* 09/17/2012   K 4.7 09/16/2012   K 4.6 09/16/2012   CL 112 09/17/2012   CL 104 09/16/2012   CL 100 09/16/2012   CO2 30 09/17/2012   CO2 27 09/16/2012   CO2 28 08/10/2012   GLUCOSE 137* 09/17/2012   GLUCOSE 280* 09/16/2012   GLUCOSE 268* 09/16/2012   BUN 31* 09/17/2012   BUN 69* 09/16/2012   BUN 56* 09/16/2012   CREATININE 0.50 09/17/2012   CREATININE 1.20* 09/16/2012   CREATININE 0.89 09/16/2012   CALCIUM 8.5 09/17/2012   CALCIUM 9.7 09/16/2012   CALCIUM 9.1 08/10/2012   LFT  Recent Labs  09/16/12 1338  PROT 7.2  ALBUMIN 2.3*  AST 28  ALT 21  ALKPHOS 86  BILITOT 0.5   PT/INR Lab Results  Component Value  Date   INR 1.42 09/17/2012   INR 3.14* 09/16/2012   INR 2.44* 08/13/2012     Ref. Range 09/16/2012 17:45  Color, Urine Latest Range: YELLOW  YELLOW  APPearance Latest Range: CLEAR  TURBID (A)  Specific Gravity, Urine Latest Range: 1.005-1.030  1.015  pH Latest Range: 5.0-8.0  6.0  Glucose Latest  Range: NEGATIVE mg/dL NEGATIVE  Bilirubin Urine Latest Range: NEGATIVE  NEGATIVE  Ketones, ur Latest Range: NEGATIVE mg/dL NEGATIVE  Protein Latest Range: NEGATIVE mg/dL 161 (A)  Urobilinogen, UA Latest Range: 0.0-1.0 mg/dL 1.0  Nitrite Latest Range: NEGATIVE  NEGATIVE  Leukocytes, UA Latest Range: NEGATIVE  LARGE (A)  Hgb urine dipstick Latest Range: NEGATIVE  LARGE (A)  Urine-Other No range found MUCOUS PRESENT  WBC, UA Latest Range: <3 WBC/hpf TOO NUMEROUS TO COUNT  RBC / HPF Latest Range: <3 RBC/hpf 3-6   enterococcal species > 100,00 on clx.   RADIOLOGY STUDIES: Ct Abdomen Pelvis Wo Contrast 09/16/2012  Findings: A large amount of stool is seen particularly in a markedly dilated rectum, suspicious for fecal impaction.  Foley catheter is seen within the bladder which is decompressed. Moderate bilateral renal pelvicaliectasis and ureterectasis is demonstrated, but no obstructing ureteral calculi identified. Multiple pelvic phleboliths noted. No evidence of perinephric inflammatory changes or fluid collections.  Diffuse colonic diverticulosis is again demonstrated, however there is no evidence of diverticulitis.  No evidence of dilated small bowel loops.  Percutaneous gastrostomy tube seen in appropriate position.  Noncontrast images of the liver, gallbladder, spleen, pancreas, and adrenal glands are unremarkable in appearance.  No soft tissue masses or lymphadenopathy identified.  No suspicious bone lesions identified.  IMPRESSION:  1. Moderate bilateral renal pelvicaliectasis and ureterectasis.  No obstructing etiology identified by CT. 2.  Large amount stool particularly in dilated rectum, suspicious for fecal impaction. 3.  Diverticulosis.  No radiographic evidence of diverticulitis.   Original Report Authenticated By: Myles Rosenthal, M.D.   Dg Abd Portable 1v 09/17/2012  Findings: The rectum and distal sigmoid are distended with stool. There is moderate stool elsewhere in the colon.  There are  multiple contrast filled diverticula throughout the colon.  No obstruction or free air is seen on this supine examination.  A rectal thermometer is in place.  IMPRESSION: Distension of rectum and distal sigmoid colon with stool.  Colonic diverticulosis. No obstruction or free air appreciated.   Original Report Authenticated By: Bretta Bang, M.D.  Dg Chest Port 1 View 09/17/2012  Findings: Aeration of the left upper lung field has improved.  Only mild left basilar linear atelectasis is present.  No focal infiltrate or effusion is noted.  Cardiomegaly is stable.  IMPRESSION: No active infiltrate.  Mild linear atelectasis at the left lung base.   Original Report Authenticated By: Dwyane Dee, M.D.   Dg Chest Port 1 View 09/16/2012   Findings: Possible mild patchy opacity in the medial left upper lobe.  Lungs otherwise essentially clear.  No frank interstitial edema. Prior left lower lobe atelectasis / effusion are improved.  No pneumothorax.  Mild cardiomegaly.  IMPRESSION: Possible mild patchy opacity in the medial left upper lobe. PA/lateral chest radiographs are suggested for further evaluation.  Prior left lower lobe atelectasis / effusion are improved.  No frank interstitial edema.   Original Report Authenticated By: Charline Bills, M.D.    ENDOSCOPIC STUDIES: Unable to find reports in Epic. Per 03/2004 note by Dr Posey Rea: " The patient had a recent GI evaluation by Dr. Arlyce Dice  including EGD.  She does have a known history of irritable bowel syndrome  but has not been on medication."    IMPRESSION: *  Rectal bleeding:  Blood mixed into otherwise brown stool,  in setting of Coumadin, fecal impaction. Rule out stercoral ulcer, rule out neoplasia ( nothing sugg of colon mass on CT).  Ischemic colitis can cause bloody stools, but no findings of colitis on CT. Although there is diverticulosis on the CT scan, the stool I observed is not c/w diverticular bleed.  *  Microcytic anemia.  Iron studies  not assayed and she is now s/p 2 units PRBCs.  *  Enterococcal UTI.  Urinary retention resolved with foley.  *  CVA with global aphasia and right hemiparesis.  Admission 07/21/12 - 08/06/12 *  Dysphagia post CVA:  ok'd for Purees and pudding thick liquids.  Limited po intake prompted IR placement of gastric feeding tube 08/02/12 *  Chronic Coumadin since 07/2012 CVA and mural thrombus on TEE 07/26/12.  Reversed with FFP *  Non verbal post CVA - per niece is speaking some [place] *  Vascular dementia moderate to severe, longstanding *  Chronic kidney disease, stage 3 per old notes, current GFR is 86.  *  CHF.  EF 15% *  OSA. Pt refused CPAP in past.  *  Hyperthyroidism.  *  Family conflict re: medical decision making. Attorneys involved.    PLAN: *  Give 1/2 bottle Miralax in 32 oz water and administer via feeding tube.  *  Stop IV Protonix, was not taking PTA. *  Can restart oral meds.  Suspect we will be able to restart tube feeds within next 24 hours.  *   Decision re scoping pt per Dr Leone Payor.    LOS: 1 day   Jennye Moccasin  09/17/2012, 11:17 AM Pager: [phone]     [facility] Attending  I have also seen and assessed the patient and agree with the above note. She has recently had a brown stool.  I think fecal impaction with stercoral ulcer seems very plausible as cause of her problems. A sigmoidoscopy is appropriate but expectant management may also be. I have spoken to her niece Jenny Brome, MD - who is in process of getting custody. Sounds like the guardian is not comfortable granting consent for medical procedures. Will regroup tomorrow - assuming she needs to go back on warfarin I am thinking of performing a sigmoidoscopy in next couple of days.  Iva Boop, MD, [facility] [phone] (pager) 09/17/2012 4:07 PM

## 2012-09-17 NOTE — Progress Notes (Signed)
INITIAL NUTRITION ASSESSMENT  DOCUMENTATION CODES Per approved criteria  -Not Applicable   INTERVENTION: 1. Once ready to initiate enteral feedings recommend initiation of Jevity 1.2 at 20 ml/hr, advance by 10 ml/hr q 4 hours, to 60 ml/hr. Goal rate will provide 1728 kcal, 81 grams protein, and 1162 ml free water. 2. Diet advancement per SLP. 3. RD to continue to follow nutrition care plan.  NUTRITION DIAGNOSIS: Inadequate oral intake related to inability to eat as evidenced by NPO status.   Goal: Initiate enteral nutrition; intake to meet at least 90% of estimated needs.  Monitor:  weight trends, lab trends, I/O's, enteral nutrition initiation, diet initiation  Reason for Assessment: Low Braden  79 y.o. female  Admitting Dx: GIB  ASSESSMENT: Admitted with GIB. Hx of CVA, aphasic. From [facility]. Pt's HCPOA is an attorney while family matters being settled. CT of abd revealed fecal impaction, diffuse colonic diverticulosis. Pt has PEG; feeding tube placed 4/25 2/2 pt unable to meet nutrition needs on dysphagia 1, pudding thickened liquid diet.  Seen by GI for rectal bleeding; recommending bowel meds and restarting tube feedings within the next 24 hours.  Per SNF MAR, pt receives Jevity 1.5 at 60 ml/hr x 20 hours daily with 1 scoop protein powder daily. 150 ml free water q 4 hours. This provides: 1825 kcal, 83 grams protein, and 912 ml free water.  Pt with recent wt loss per chart review. Pt with 6% wt loss x 1 month. Unable to complete physical exam at this time. Pt is at nutrition risk given this wt loss and chronic medical issues.  Height: Ht Readings from Last 1 Encounters:  09/16/12 5' 4.96" (1.65 m)    Weight: Wt Readings from Last 1 Encounters:  09/17/12 167 lb 5.3 oz (75.9 kg)    Ideal Body Weight: 125 lb  % Ideal Body Weight: 134%  Wt Readings from Last 10 Encounters:  09/17/12 167 lb 5.3 oz (75.9 kg)  08/15/12 164 lb (74.39 kg)  08/13/12 177 lb 4 oz  (80.4 kg)  08/13/12 177 lb 4 oz (80.4 kg)  04/06/11 168 lb 10.4 oz (76.5 kg)  02/19/08 177 lb (80.287 kg)  09/28/07 175 lb (79.379 kg)    Usual Body Weight: 177 lb  % Usual Body Weight: 94%; 6% x 1 month  BMI:  Body mass index is 27.88 kg/(m^2). Overweight  Estimated Nutritional Needs: Kcal: 1600 - 1800 Protein: 75 - 85 grams Fluid: 1.6 - 1.8 liters daily  Skin: intact  Diet Order:    EDUCATION NEEDS: -No education needs identified at this time   Intake/Output Summary (Last 24 hours) at 09/17/12 1410 Last data filed at 09/17/12 1200  Gross per 24 hour  Intake 2045.5 ml  Output   2625 ml  Net -579.5 ml    Last BM: 6/9  Labs:   Recent Labs Lab 09/16/12 1338 09/16/12 1356 09/17/12 0545  NA 140 142 149*  K 4.6 4.7 3.0*  CL 100 104 112  CO2 27  --  30  BUN 56* 69* 31*  CREATININE 0.89 1.20* 0.50  CALCIUM 9.7  --  8.5  MG  --   --  2.0  PHOS  --   --  3.7  GLUCOSE 268* 280* 137*    CBG (last 3)   Recent Labs  09/17/12 0421 09/17/12 0736 09/17/12 1158  GLUCAP 140* 130* 114*    Scheduled Meds: . levothyroxine  37.5 mcg Intravenous QAC breakfast  . piperacillin-tazobactam (ZOSYN)  IV  3.375 g Intravenous Q8H  . polyethylene glycol powder  0.5 Container Oral Once  . sodium phosphate  1 enema Rectal Once  . vancomycin  1,000 mg Intravenous Q24H    Continuous Infusions: . sodium chloride 50 mL/hr at 09/16/12 1807    Past Medical History  Diagnosis Date  . Hyperthyroidism     had RAI-is on thyroid replacement, happened maybe this summer  . Coronary artery disease ?, before 2005    Had a catheterization per son by Dr. [name] in 1991 no reports in  [place].  note from 2005 mentions hx of coronary stent  . GERD (gastroesophageal reflux disease)   . Stroke 07/2012    Right hemiplegia, dysphagia, aphasia.  . Dementia   . Aphasia 07/2012  . Chronic systolic heart failure     07/2012 the EF was 15%  . Mural thrombus of cardiac apex   . Obesity    . IBS (irritable bowel syndrome)   . DVT (deep venous thrombosis) before 2005    occurred after trauma, treated for a while with Coumadin.   Marland Kitchen Dysphagia due to recent stroke 07/2012    ok'd for purree/pudding thick liquids but G tube placed by IR  . OSA (obstructive sleep apnea)     refused CPAP per notes.     Past Surgical History  Procedure Laterality Date  . Tee without cardioversion N/A 07/26/2012    Procedure: TRANSESOPHAGEAL ECHOCARDIOGRAM (TEE);  Surgeon: Laurey Morale, MD;  Location: [facility] ENDOSCOPY;  Service: Cardiovascular;  Laterality: N/A;  . Gastric feeding tube  08/02/2012    placed in radiology    Jarold Motto MS, RD, LDN Pager: [phone] After-hours pager: [phone]

## 2012-09-18 LAB — COMPREHENSIVE METABOLIC PANEL
Alkaline Phosphatase: 71 U/L (ref 39–117)
BUN: 20 mg/dL (ref 6–23)
CO2: 28 mEq/L (ref 19–32)
Chloride: 110 mEq/L (ref 96–112)
Creatinine, Ser: 0.46 mg/dL — ABNORMAL LOW (ref 0.50–1.10)
GFR calc Af Amer: >90 mL/min (ref >90)
GFR calc non Af Amer: 89 mL/min — ABNORMAL LOW (ref >90)
Glucose, Bld: 93 mg/dL (ref 70–99)
Potassium: 2.8 mEq/L — ABNORMAL LOW (ref 3.5–5.1)
Total Bilirubin: 0.7 mg/dL (ref 0.3–1.2)

## 2012-09-18 LAB — CBC
HCT: 33.1 % — ABNORMAL LOW (ref 36.0–46.0)
Hemoglobin: 10.2 g/dL — ABNORMAL LOW (ref 12.0–15.0)
MCHC: 31.7 g/dL (ref 30.0–36.0)
Platelets: 164 10*3/uL (ref 150–400)
Platelets: 180 10*3/uL (ref 150–400)
RBC: 3.85 MIL/uL — ABNORMAL LOW (ref 3.87–5.11)
RDW: 21 % — ABNORMAL HIGH (ref 11.5–15.5)
WBC: 8.8 10*3/uL (ref 4.0–10.5)
WBC: 7.5 10*3/uL (ref 4.0–10.5)

## 2012-09-18 LAB — URINE CULTURE: Colony Count: >=100000

## 2012-09-18 LAB — GLUCOSE, CAPILLARY
Glucose-Capillary: 89 mg/dL (ref 70–99)
Glucose-Capillary: 100 mg/dL — ABNORMAL HIGH (ref 70–99)

## 2012-09-18 MED ORDER — POTASSIUM CHLORIDE 20 MEQ/15ML (10%) PO LIQD
20.0000 meq | Freq: Three times a day (TID) | ORAL | Status: DC
  Administered 2012-09-18 – 2012-09-20 (×7): 20 meq
  Filled 2012-09-18 (×10): qty 15

## 2012-09-18 MED ORDER — SODIUM CHLORIDE 0.9 % IV SOLN: INTRAVENOUS | Status: DC

## 2012-09-18 MED ORDER — JEVITY 1.2 CAL PO LIQD
1000.0000 mL | ORAL | Status: DC
  Administered 2012-09-18: 20 mL/h
  Administered 2012-09-19: 1000 mL
  Administered 2012-09-20: 18:00:00
  Administered 2012-09-21 – 2012-09-24 (×5): 1000 mL
  Filled 2012-09-18 (×12): qty 1000

## 2012-09-18 MED ORDER — BENEPROTEIN PO POWD
1.0000 | Freq: Every day | ORAL | Status: DC
  Administered 2012-09-18 – 2012-09-25 (×8): 6 g via ORAL
  Filled 2012-09-18 (×2): qty 227

## 2012-09-18 MED ORDER — VANCOMYCIN HCL IN DEXTROSE 750-5 MG/150ML-% IV SOLN
750.0000 mg | INTRAVENOUS | Status: DC
  Administered 2012-09-18 – 2012-09-19 (×2): 750 mg via INTRAVENOUS
  Filled 2012-09-18 (×2): qty 150

## 2012-09-18 MED ORDER — POTASSIUM CHLORIDE 10 MEQ/100ML IV SOLN
10.0000 meq | INTRAVENOUS | Status: AC
  Administered 2012-09-18 – 2012-09-19 (×3): 10 meq via INTRAVENOUS
  Filled 2012-09-18 (×3): qty 100

## 2012-09-18 MED ORDER — INSULIN ASPART 100 UNIT/ML ~~LOC~~ SOLN
0.0000 [IU] | SUBCUTANEOUS | Status: DC
  Administered 2012-09-19: 1 [IU] via SUBCUTANEOUS
  Administered 2012-09-19 – 2012-09-20 (×7): 2 [IU] via SUBCUTANEOUS
  Administered 2012-09-20: 1 [IU] via SUBCUTANEOUS
  Administered 2012-09-21: 2 [IU] via SUBCUTANEOUS
  Administered 2012-09-21: 1 [IU] via SUBCUTANEOUS
  Administered 2012-09-21 – 2012-09-22 (×3): 2 [IU] via SUBCUTANEOUS
  Administered 2012-09-22: 17:00:00 via SUBCUTANEOUS
  Administered 2012-09-22 (×2): 1 [IU] via SUBCUTANEOUS
  Administered 2012-09-22 – 2012-09-23 (×3): 2 [IU] via SUBCUTANEOUS
  Administered 2012-09-23: 1 [IU] via SUBCUTANEOUS
  Administered 2012-09-23 (×2): 2 [IU] via SUBCUTANEOUS
  Administered 2012-09-23 – 2012-09-24 (×3): 1 [IU] via SUBCUTANEOUS
  Administered 2012-09-24 – 2012-09-25 (×6): 2 [IU] via SUBCUTANEOUS
  Administered 2012-09-25 (×2): 1 [IU] via SUBCUTANEOUS
  Administered 2012-09-25 (×2): 2 [IU] via SUBCUTANEOUS

## 2012-09-18 MED ORDER — POLYETHYLENE GLYCOL 3350 17 G PO PACK
17.0000 g | PACK | Freq: Every day | ORAL | Status: DC
  Administered 2012-09-18 – 2012-09-25 (×8): 17 g
  Filled 2012-09-18 (×8): qty 1

## 2012-09-18 MED ORDER — LEVOTHYROXINE SODIUM 75 MCG PO TABS
75.0000 ug | ORAL_TABLET | Freq: Every day | ORAL | Status: DC
  Administered 2012-09-19 – 2012-09-25 (×7): 75 ug
  Filled 2012-09-18 (×10): qty 1

## 2012-09-18 MED ORDER — DOCUSATE SODIUM 50 MG/5ML PO LIQD
100.0000 mg | Freq: Two times a day (BID) | ORAL | Status: DC
  Administered 2012-09-18 – 2012-09-25 (×14): 100 mg
  Filled 2012-09-18 (×15): qty 10

## 2012-09-18 MED ORDER — PROTEIN POWD: 1.0000 | Freq: Every day | Status: DC

## 2012-09-18 MED ORDER — MEMANTINE HCL 5 MG PO TABS
5.0000 mg | ORAL_TABLET | Freq: Two times a day (BID) | ORAL | Status: DC
  Administered 2012-09-18 – 2012-09-25 (×15): 5 mg
  Filled 2012-09-18 (×16): qty 1

## 2012-09-18 MED ORDER — DOCUSATE SODIUM 50 MG/5ML PO LIQD
100.0000 mg | Freq: Two times a day (BID) | ORAL | Status: DC
  Administered 2012-09-18: 100 mg via ORAL
  Filled 2012-09-18 (×2): qty 10

## 2012-09-18 NOTE — Progress Notes (Signed)
Foley cath dc'ed per MD order @ 1125. Erron Wengert, [facility]

## 2012-09-18 NOTE — Progress Notes (Signed)
[facility] Progress Note [place] TEAM 1 - Stepdown/ICU TEAM   Jenny Brady ZOX:[id number] DOB: August 01, 1925 DOA: 09/16/2012 PCP: [name], MD  Brief narrative: 79 year old [facility]. Sent to the ER because of rectal bleeding, fever and urinary retention. She was found to be hypotensive in the emergency department. She was admitted to the ICU by critical care medicine. CT of the abdomen and pelvis did reveal fecal impaction and diffuse colonic diverticulosis as well as moderate bilateral renal pelvocaliectasis ureterectasis. She was somewhat hypoxemic and felt to have hospital acquired pneumonia and was placed on broad-spectrum antibiotics. She was felt to be dehydrated as evidenced by elevated serum lactate, azotemia and mild acute renal insufficiency. Patient had baseline normal renal function. She was hydrated. She received 2 units of packed red blood cells. She was on Coumadin prior to admission for suspected mural thrombus so she was also given 2 units of FFP to reverse her INR (3.14) in the setting of lower GI bleeding. She stabilized from a hemodynamic standpoint and was appropriate to transfer to the step down unit. Team 1 assumed care of this patient on 09/17/2012.  Assessment/Plan:  HYPOTENSION - hypovolemia vs/ sepsis -BP at baseline given chronic systolic dysfunction -source unclear but likely UTI -cont to hold anti-HTN agents since BP still soft, but improving  Acute respiratory failure with hypoxia -possibly only atlx seen on CXR - doubt true PNA and hypoxia likely due to acute encephalopathy in setting of recent CVA/dementia -wean O2 as tolerated  Enterococcus UTI (lower urinary tract infection)  -sensitivities pending -cont empiric anbx's and narrow as able based on sensitivities   Gram positive cocci in clusters bacteremia (2/2 positive) -2/2 blood cx positive for GPC in clusters -follow up sensitivities and  continue empiric anbxs (Vanc) -may require ID consult for further investigation  Rectal bleed/history of GERD (gastroesophageal reflux disease)/obstipation -Coumadin on hold -has had recurrent melena x 3 since admit-most recent stools have been brown/soft -[facility] consulted-suspects stercoral ulcer due to constipation -abdomen remains tender but no peritoneal signs-KUB 6/10 rectum and distal sigmoid colon distended with stool and moderate stool throughout remainder of colon -given enema and dulcolax supp 6/10 with some results -mild azotemia -on PPI pre admit -begin scheduled Colace and Miralax  Hypokalemia -replete - check Mg in AM    Coronary artery disease -stable- see below re: ECHO/RWMA   CKD (chronic kidney disease) stage 3, GFR 30-59 ml/min -renal function stable/improving with good UOP  H/O ischemic left MCA stroke -April 2014 -residual right hemiparesis and now unable to communicate in [place] - only in [place] -PT/OT when more stable  Chronic systolic congestive heart failure, NYHA class 3 / RHF chronic -compensated -EF 20%-25% with diffuse hypokinesis via TEE April 2014 -Lasix/ACE I on hold due to recent DH/hypotension - given dementia and suspected poor PO intake chronically may not be great candidate for scheduled Lasix and instead would be best served by weight based dosing  Anemia of chronic disease -admit Hgb 12 in setting of DH -post 2 ubits PRBC since admit -Hgb appears stable at this time   Moderate pulmonary HTN/Moderate TR -see above re: RHF  Hyperthyroidism-s/p RAI admin -change IV Synthroid to per tube  Dementia with behavioral disturbance / Dysphagia, unspecified(787.20) -sx's exacerbated by recent CVA -once GI eval complete will need swallow eval before resume PO's -resume tube feedings per recs nutrition/has PEG  Long term use of anticoagulants / Mural thrombus of heart -started last  admit after TEE suggested possible LV thrombus -need to  clarify GIB before consider resume Coumadin -may not be best candidate for anti-coagulation long term -pending results GI eval may need Cardiology to comment on use of Coumadin   DVT prophylaxis: SCDs Code Status: Full Family Communication: Son has power of attorney but other family members are applying for guardianship due to son not being reliable as advocate for patient (apparent alcoholic) - patient's family is working with lawyer [name] [phone] to obtain guardianship - all medical questions intended for family as well as any need for consent for procedures or treatments need to be directed to [name]  Per SW notes: Per [name], several attempts have been made to get in contact with pt's son, [name], to no avail; Well checks via [employer], Letters mailed to his home, and calls made to son's phone number.   Per chart review and discussion with Chiropodist, CSW department had difficulty getting in touch with pt's son and pt's niece, [name] became contact person for pt; assisting with pt's placement at [facility]   [facility] filed for a motion to determine pt's competency and, if decided pt is incompetent, to have a person identified to assist with pt's decision making.  CSW attempted to phone pt's son-voicemail message was in [place]. CSW left voicemail message in both [place] and [place] requesting a return call. [name] provided son's home number [phone]). When CSW attempted to phone this number, a message stated this number is not accepting phone calls.   CSW spoke with [name] who stated she is currently out of town and requested [name] be added to list to allow Marisue Ivan to receive updates as well.   Disposition Plan: Transfer to Telemetry Isolation: None  Consultants: Gastroenterology  Procedures: None  Antibiotics: Zosyn 6/9 >>>6/11 Vancomycin 6/9 >>>  HPI/Subjective: Patient awake but unable to provide a  reliable history given prior CVA and aphasia.  Objective: Blood pressure 105/80, pulse 77, temperature 98.2 F (36.8 C), temperature source Axillary, resp. rate 17, height 5' 4.96" (1.65 m), weight 76 kg (167 lb 8.8 oz), SpO2 100.00%.  Intake/Output Summary (Last 24 hours) at 09/18/12 1127 Last data filed at 09/18/12 0750  Gross per 24 hour  Intake   1100 ml  Output   1300 ml  Net   -200 ml    Exam: General: No acute respiratory distress Lungs: Clear to auscultation bilaterally without wheezes or crackles, RA Cardiovascular: Regular rate and rhythm without murmur gallop or rub normal S1 and S2, no peripheral edema or JVD Abdomen: Diffuse tender, nondistended, soft, hypoactive bowel sounds positive, no rebound, no ascites, no appreciable mass-RN reports melena, PEG tube Musculoskeletal: No significant cyanosis, clubbing of bilateral lower extremities Neurological: Awake with eye contact but does not track persons in room, very weak spontaneous movement of left side, noted right-sided hemiplegia, no apparent facial drooping, appears nonverbal   Scheduled Meds: Scheduled Meds: . docusate  100 mg Oral BID  . levothyroxine  37.5 mcg Intravenous QAC breakfast  . piperacillin-tazobactam (ZOSYN)  IV  3.375 g Intravenous Q8H  . polyethylene glycol  17 g Per Tube Daily  . potassium chloride  20 mEq Per Tube TID  . sodium phosphate  1 enema Rectal Once  . vancomycin  750 mg Intravenous Q24H    Data Reviewed: Basic Metabolic Panel:  Recent Labs Lab 09/16/12 1338 09/16/12 1356 09/17/12 0545 09/18/12 0732  NA 140 142 149* 147*  K  4.6 4.7 3.0* 2.8*  CL 100 104 112 110  CO2 27  --  30 28  GLUCOSE 268* 280* 137* 93  BUN 56* 69* 31* 20  CREATININE 0.89 1.20* 0.50 0.46*  CALCIUM 9.7  --  8.5 8.4  MG  --   --  2.0  --   PHOS  --   --  3.7  --    Liver Function Tests:  Recent Labs Lab 09/16/12 1338 09/18/12 0732  AST 28 17  ALT 21 16  ALKPHOS 86 71  BILITOT 0.5 0.7  PROT 7.2  5.9*  ALBUMIN 2.3* 2.0*    Recent Labs Lab 09/16/12 1338  LIPASE 32   CBC:  Recent Labs Lab 09/16/12 1338  09/17/12 0117 09/17/12 0545 09/17/12 1600 09/17/12 2314 09/18/12 0732  WBC 11.9*  --  8.8 8.8 9.0 9.0 7.5  NEUTROABS 10.0*  --   --   --   --   --   --   HGB 12.9  < > 10.2* 9.7* 10.9* 10.6* 10.5*  HCT 39.9  < > 31.7* 30.0* 34.1* 33.0* 33.1*  MCV 80.1  --  82.3 82.0 83.0 82.3 82.5  PLT 259  --  164 140* 178 164 180  < > = values in this interval not displayed. Cardiac Enzymes:  Recent Labs Lab 09/17/12 0117  TROPONINI <0.30   BNP (last 3 results)  Recent Labs  07/21/12 1635  PROBNP 9584.0*   CBG:  Recent Labs Lab 09/17/12 1628 09/17/12 1943 09/17/12 2339 09/18/12 0347 09/18/12 0747  GLUCAP 108* 94 97 89 92    Recent Results (from the past 240 hour(s))  CULTURE, BLOOD (ROUTINE X 2)     Status: None   Collection Time    09/16/12  1:45 PM      Result Value Range Status   Specimen Description BLOOD ARM LEFT   Final   Special Requests BOTTLES DRAWN AEROBIC AND ANAEROBIC 10CC   Final   Culture  Setup Time 09/16/2012 16:39   Final   Culture     Final   Value: GRAM POSITIVE COCCI IN CLUSTERS     Note: Gram Stain Report Called to,Read Back By and Verified With: MONICA L @ 1240 09/17/12 BY KRAWS   Report Status PENDING   Incomplete  CULTURE, BLOOD (ROUTINE X 2)     Status: None   Collection Time    09/16/12  2:10 PM      Result Value Range Status   Specimen Description BLOOD ARM LEFT   Final   Special Requests BOTTLES DRAWN AEROBIC AND ANAEROBIC 10CC   Final   Culture  Setup Time 09/16/2012 16:39   Final   Culture     Final   Value: GRAM POSITIVE COCCI IN CLUSTERS     Note: Gram Stain Report Called to,Read Back By and Verified With: DOROTHY [facility] ON 09/17/2012 AT 8:59P BY WILEJ   Report Status PENDING   Incomplete  URINE CULTURE     Status: None   Collection Time    09/16/12  2:27 PM      Result Value Range Status   Specimen Description URINE,  CATHETERIZED   Final   Special Requests NONE   Final   Culture  Setup Time 09/16/2012 14:47   Final   Colony Count >=100,000 COLONIES/ML   Final   Culture ENTEROCOCCUS SPECIES   Final   Report Status 09/18/2012 FINAL   Final   Organism ID, Bacteria ENTEROCOCCUS SPECIES  Final  MRSA PCR SCREENING     Status: None   Collection Time    09/16/12  5:13 PM      Result Value Range Status   MRSA by PCR NEGATIVE  NEGATIVE Final   Comment:            The GeneXpert MRSA Assay (FDA     approved for NASAL specimens     only), is one component of a     comprehensive MRSA colonization     surveillance program. It is not     intended to diagnose MRSA     infection nor to guide or     monitor treatment for     MRSA infections.  URINE CULTURE     Status: None   Collection Time    09/16/12  5:45 PM      Result Value Range Status   Specimen Description URINE, CATHETERIZED   Final   Special Requests NONE   Final   Culture  Setup Time 09/16/2012 21:21   Final   Colony Count NO GROWTH   Final   Culture NO GROWTH   Final   Report Status 09/18/2012 FINAL   Final     Studies:  Recent x-ray studies have been reviewed in detail by the Attending Physician   Junious Silk, ANP [facility] Office  [phone] Pager [phone]  **If unable to reach the above provider after paging please contact the Flow Manager @ [phone]  On-Call/Text Page:      [email or web address]      password [email or web address]  If 7PM-7AM, please contact night-coverage [email or web address] Password [email or web address] 09/18/2012, 11:27 AM   LOS: 2 days   I have personally examined this patient and reviewed the entire database. I have reviewed the above note, made any necessary editorial changes, and agree with its content.  Lonia Blood, MD [facility]

## 2012-09-18 NOTE — Progress Notes (Signed)
Family reports the Attorney [name] Mayford Knife is out of town until Monday. Family shared e-mail from attorney that shows that she will only be assisting in determining competency and does not feel comfortable signing for or making any medical decisions. They report she will not be signing for consent for Flexible Sigmoidoscopy. Jenny Brady

## 2012-09-18 NOTE — Progress Notes (Signed)
[facility] Daily Rounding Note 09/18/2012, 8:29 AM  SUBJECTIVE:       4 stools during night shift.  3 loose, 1 semi formed, not reported as bloody by nursing.  Passing flatus.  Transferred to stepdown unit overnight.  Uneventful night.   OBJECTIVE:         Vital signs in last 24 hours:    Temp:  [97.3 F (36.3 C)-99.5 F (37.5 C)] 98.2 F (36.8 C) (06/11 0749) Pulse Rate:  [60-84] 77 (06/11 0749) Resp:  [13-25] 16 (06/11 0749) BP: (91-136)/(35-73) 109/50 mmHg (06/11 0749) SpO2:  [91 %-100 %] 97 % (06/11 0749) Weight:  [76 kg (167 lb 8.8 oz)] 76 kg (167 lb 8.8 oz) (06/11 0005) Last BM Date: 09/16/12 General: alert and awakens to exam but not responding to commands.   Heart: RRR Chest:  Clear in front, no labored breathing though exclusively mouth breathing, "O" sign evident with dry oral MM Abdomen: soft, active BS, ? Tender on the left sided  Extremities: Bil pedal edema is substantial but does not pit Neuro/Psych:  Quietly resting.   Lab Results:  Recent Labs  09/17/12 1600 09/17/12 2314 09/18/12 0732  WBC 9.0 9.0 7.5  HGB 10.9* 10.6* 10.5*  HCT 34.1* 33.0* 33.1*  PLT 178 164 180      Recent Labs  09/16/12 1338 09/17/12 0117  LABPROT 30.6* 17.0*  INR 3.14* 1.42   Studies/Results: Ct Abdomen Pelvis Wo Contrast 09/16/2012    IMPRESSION:  1. Moderate bilateral renal pelvicaliectasis and ureterectasis.  No obstructing etiology identified by CT. 2.  Large amount stool particularly in dilated rectum, suspicious for fecal impaction. 3.  Diverticulosis.  No radiographic evidence of diverticulitis.   Original Report Authenticated By: Myles Rosenthal, M.D.   Dg Chest Port 1 View 09/17/2012   IMPRESSION: No active infiltrate.  Mild linear atelectasis at the left lung base.   Original Report Authenticated By: Dwyane Dee, M.D.   ASSESMENT: * Rectal bleeding: Blood mixed into otherwise brown stool, in setting of Coumadin, fecal impaction.  Suspect stercoral ulcer. Impaction  improved, several BMs after 1/2 volume Miralax prep 6/10.  Bleeding appears to have resolved.  * Microcytic anemia. Iron studies not assayed and she is now s/p 2 units PRBCs. H & H stable * Enterococcal UTI. Urinary retention resolved with foley. ABX are zosyn and vanco * CVA with global aphasia and right hemiparesis. Admission 07/21/12 - 08/06/12  * Dysphagia post CVA: ok'd for Purees and pudding thick liquids. Limited po intake prompted IR placement of gastric feeding tube 08/02/12  No evidence of pna, aspiration or otherwise.  * Chronic Coumadin since 07/2012 CVA and mural thrombus on TEE 07/26/12. Reversed with FFP and on hold.  No word as to whether this will be restarted.  * Non verbal post CVA - per niece is speaking some [place]  * Vascular dementia moderate to severe, longstanding  * Chronic kidney disease, stage 3 per old notes, current GFR is 73.  * CHF. * OSA. Pt refused CPAP in past.  * Hyperthyroidism.  * Family conflict re: medical decision making. Attorneys involved.   PLAN: *  ? Refine abx.  Enterococcus in urine is sensitive to many, less powerful and costly abx.  *  Flex sig scheduled for 6/11 at 10:15 AM.  However can not do this, or the tap water enemas prep, unless [name], the POA/guardian ad litum, consents to procedure.  I left message at her phone 543 [phone]  at 3 PM today.     LOS: 2 days   Jennye Moccasin  09/18/2012, 8:29 AM Pager: [phone]  [facility] Attending  I have also seen and assessed the patient and agree with the above note. Trying to arrange a flex sig - I updated niece Marisue Ivan.  Iva Boop, MD, [facility] [phone] (pager) 09/18/2012 5:17 PM

## 2012-09-19 ENCOUNTER — Encounter (HOSPITAL_COMMUNITY)
Admission: EM | Disposition: A | Discharge status: Skilled Nursing Facility | Payer: Self-pay | Source: Home / Self Care | Attending: Internal Medicine

## 2012-09-19 DIAGNOSIS — Z8673 Personal history of transient ischemic attack (TIA), and cerebral infarction without residual deficits: Secondary | ICD-10-CM

## 2012-09-19 DIAGNOSIS — I5022 Chronic systolic (congestive) heart failure: Secondary | ICD-10-CM

## 2012-09-19 DIAGNOSIS — R079 Chest pain, unspecified: Secondary | ICD-10-CM

## 2012-09-19 LAB — BASIC METABOLIC PANEL
BUN: 17 mg/dL (ref 6–23)
Chloride: 111 mEq/L (ref 96–112)
Creatinine, Ser: 0.37 mg/dL — ABNORMAL LOW (ref 0.50–1.10)
GFR calc non Af Amer: >90 mL/min (ref >90)
Glucose, Bld: 88 mg/dL (ref 70–99)
Potassium: 3.7 mEq/L (ref 3.5–5.1)

## 2012-09-19 LAB — GLUCOSE, CAPILLARY
Glucose-Capillary: 102 mg/dL — ABNORMAL HIGH (ref 70–99)
Glucose-Capillary: 136 mg/dL — ABNORMAL HIGH (ref 70–99)
Glucose-Capillary: 176 mg/dL — ABNORMAL HIGH (ref 70–99)
Glucose-Capillary: 174 mg/dL — ABNORMAL HIGH (ref 70–99)

## 2012-09-19 LAB — CBC
HCT: 34.4 % — ABNORMAL LOW (ref 36.0–46.0)
Hemoglobin: 11.1 g/dL — ABNORMAL LOW (ref 12.0–15.0)
MCH: 26.6 pg (ref 26.0–34.0)
MCHC: 32.3 g/dL (ref 30.0–36.0)
RDW: 20.8 % — ABNORMAL HIGH (ref 11.5–15.5)

## 2012-09-19 SURGERY — SIGMOIDOSCOPY, FLEXIBLE
Anesthesia: Moderate Sedation

## 2012-09-19 MED ORDER — VANCOMYCIN HCL IN DEXTROSE 750-5 MG/150ML-% IV SOLN
750.0000 mg | Freq: Two times a day (BID) | INTRAVENOUS | Status: DC
  Administered 2012-09-19 – 2012-09-20 (×2): 750 mg via INTRAVENOUS
  Filled 2012-09-19 (×4): qty 150

## 2012-09-19 MED ORDER — PERFLUTREN LIPID MICROSPHERE
1.0000 mL | INTRAVENOUS | Status: AC | PRN
  Administered 2012-09-19: 2 mL via INTRAVENOUS
  Filled 2012-09-19: qty 10

## 2012-09-19 MED ORDER — ACETAMINOPHEN 325 MG PO TABS: 650.0000 mg | ORAL_TABLET | ORAL | Status: DC | PRN

## 2012-09-19 MED ORDER — PANTOPRAZOLE SODIUM 40 MG PO PACK
40.0000 mg | PACK | Freq: Every day | ORAL | Status: DC
  Administered 2012-09-19 – 2012-09-24 (×6): 40 mg
  Filled 2012-09-19 (×9): qty 20

## 2012-09-19 MED ORDER — CYANOCOBALAMIN 500 MCG PO TABS
500.0000 ug | ORAL_TABLET | Freq: Every day | ORAL | Status: DC
  Administered 2012-09-20 – 2012-09-25 (×6): 500 ug
  Filled 2012-09-19 (×6): qty 1

## 2012-09-19 MED ORDER — SIMVASTATIN 20 MG PO TABS
20.0000 mg | ORAL_TABLET | Freq: Every day | ORAL | Status: DC
Start: 2012-09-20 — End: 2012-09-25
  Administered 2012-09-20 – 2012-09-25 (×6): 20 mg
  Filled 2012-09-19 (×6): qty 1

## 2012-09-19 NOTE — Progress Notes (Signed)
  Echocardiogram 2D Echocardiogram has been performed.  [place] 09/19/2012, 4:38 PM

## 2012-09-19 NOTE — Progress Notes (Signed)
[facility] Progress Note  Patient Name: Jenny Brady Date of Encounter: 09/19/2012, 10:05 AM    Subjective  No bleeding reported   Objective    Physical Exam: Filed Vitals:   09/19/12 1000  BP: 128/70  Pulse: 88  Temp: 98 F (36.7 C)  Resp: 18   General: chronically ill, s/p L MCA stroke     Labs:  Recent Labs  09/16/12 1356 09/17/12 0545 09/18/12 0732 09/19/12 0505  NA 142 149* 147* 144  K 4.7 3.0* 2.8* 3.7  CL 104 112 110 111  CO2  --  30 28 25   GLUCOSE 280* 137* 93 88  BUN 69* 31* 20 17  CREATININE 1.20* 0.50 0.46* 0.37*  CALCIUM  --  8.5 8.4 8.7  MG  --  2.0  --  1.8  PHOS  --  3.7  --   --     Recent Labs  09/16/12 1338  09/18/12 0732 09/19/12 0505  WBC 11.9*  < > 7.5 5.4  NEUTROABS 10.0*  --   --   --   HGB 12.9  < > 10.5* 11.1*  HCT 39.9  < > 33.1* 34.4*  MCV 80.1  < > 82.5 82.3  PLT 259  < > 180 204  < > = values in this interval not displayed.    Assessment and Plan  Rectal bleeding in setting of warfarin and fecal impaction.  Impaction and bleeding are resolved.  Unable to contain consent for endoscopic evaluation.  Please sort out how important anticoagulation is - I am not opposed to restarting it now, could chooses something with short half-life?   I have suspected that she had stercoral ulcer from impaction. If so should be getting better now.  Once the consent issues can be sorted out please let us know.  Continue bowel regimen.  Iva Boop, MD, [facility] [phone] (pager) 09/19/2012 10:10 AM

## 2012-09-19 NOTE — Progress Notes (Signed)
ANTIBIOTIC CONSULT NOTE-PROGRESS NOTE  Pharmacy Consult for : Vancomycin Indication: Staph coag [-] bacteremia, Enterococcal UTI  Hospital Problems Active Problems:   HYPOTENSION   Hyperthyroidism-s/p RAI admin   Coronary artery disease   GERD (gastroesophageal reflux disease)   CKD (chronic kidney disease) stage 3, GFR 30-59 ml/min   H/O ischemic left MCA stroke   Chronic systolic congestive heart failure, NYHA class 3   UTI (lower urinary tract infection)   Dementia with behavioral disturbance   Dysphagia, unspecified(787.20)   Long term (current) use of anticoagulants   Rectal bleed   Sepsis   HCAP (healthcare-associated pneumonia)   Acute respiratory failure with hypoxia   Mural thrombus of heart   Anemia of chronic disease   Fecal impaction  Weight: 77.5 kg  Vitals: BP 128/70  Pulse 88  Temp(Src) 98 F (36.7 C) (Oral)  Resp 18  Ht 5' 4.96" (1.65 m)  Wt 170 lb 13.7 oz (77.5 kg)  BMI 28.47 kg/m2  SpO2 100%  Labs:  Recent Labs  09/17/12 0545 09/18/12 0732 09/19/12 0505  WBC 8.8 7.5 5.4  HGB 9.7* 10.5* 11.1*  PLT 140* 180 204  CREATININE 0.50 0.46* 0.37*   Estimated Creatinine Clearance: 53.8 ml/min (by C-G formula based on Cr of 0.37).   Microbiology: Recent Results (from the past 720 hour(s))  CULTURE, BLOOD (ROUTINE X 2)     Status: None   Collection Time    09/16/12  1:45 PM      Result Value Range Status   Specimen Description BLOOD ARM LEFT   Final   Special Requests BOTTLES DRAWN AEROBIC AND ANAEROBIC 10CC   Final   Culture  Setup Time 09/16/2012 16:39   Final   Culture     Final   Value: STAPHYLOCOCCUS SPECIES (COAGULASE NEGATIVE)     Note: Gram Stain Report Called to,Read Back By and Verified With: MONICA L @ 1240 09/17/12 BY KRAWS   Report Status PENDING   Incomplete  CULTURE, BLOOD (ROUTINE X 2)     Status: None   Collection Time    09/16/12  2:10 PM      Result Value Range Status   Specimen Description BLOOD ARM LEFT   Final   Special  Requests BOTTLES DRAWN AEROBIC AND ANAEROBIC 10CC   Final   Culture  Setup Time 09/16/2012 16:39   Final   Culture     Final   Value: STAPHYLOCOCCUS SPECIES (COAGULASE NEGATIVE)     Note: RIFAMPIN AND GENTAMICIN SHOULD NOT BE USED AS SINGLE DRUGS FOR TREATMENT OF STAPH INFECTIONS.     Note: Gram Stain Report Called to,Read Back By and Verified With: DOROTHY [facility] ON 09/17/2012 AT 8:59P BY WILEJ   Report Status PENDING   Incomplete  URINE CULTURE     Status: None   Collection Time    09/16/12  2:27 PM      Result Value Range Status   Specimen Description URINE, CATHETERIZED   Final   Special Requests NONE   Final   Culture  Setup Time 09/16/2012 14:47   Final   Colony Count >=100,000 COLONIES/ML   Final   Culture ENTEROCOCCUS SPECIES   Final   Report Status 09/18/2012 FINAL   Final   Organism ID, Bacteria ENTEROCOCCUS SPECIES   Final  MRSA PCR SCREENING     Status: None   Collection Time    09/16/12  5:13 PM      Result Value Range Status   MRSA by PCR  NEGATIVE  NEGATIVE Final   Comment:            The GeneXpert MRSA Assay (FDA     approved for NASAL specimens     only), is one component of a     comprehensive MRSA colonization     surveillance program. It is not     intended to diagnose MRSA     infection nor to guide or     monitor treatment for     MRSA infections.  URINE CULTURE     Status: None   Collection Time    09/16/12  5:45 PM      Result Value Range Status   Specimen Description URINE, CATHETERIZED   Final   Special Requests NONE   Final   Culture  Setup Time 09/16/2012 21:21   Final   Colony Count NO GROWTH   Final   Culture NO GROWTH   Final   Report Status 09/18/2012 FINAL   Final    Anti-infectives Anti-infectives   Start     Dose/Rate Route Frequency Ordered Stop   09/18/12 1100  vancomycin (VANCOCIN) IVPB 750 mg/150 ml premix     750 mg 150 mL/hr over 60 Minutes Intravenous Every 24 hours 09/18/12 1004     09/17/12 1500  vancomycin (VANCOCIN) IVPB  1000 mg/200 mL premix  Status:  Discontinued     1,000 mg 200 mL/hr over 60 Minutes Intravenous Every 24 hours 09/16/12 1421 09/18/12 1004   09/16/12 2200  piperacillin-tazobactam (ZOSYN) IVPB 3.375 g  Status:  Discontinued     3.375 g 12.5 mL/hr over 240 Minutes Intravenous 3 times per day 09/16/12 1421 09/18/12 1514   09/16/12 1345  piperacillin-tazobactam (ZOSYN) IVPB 3.375 g     3.375 g 100 mL/hr over 30 Minutes Intravenous  Once 09/16/12 1339 09/16/12 1448   09/16/12 1345  vancomycin (VANCOCIN) IVPB 1000 mg/200 mL premix     1,000 mg 200 mL/hr over 60 Minutes Intravenous  Once 09/16/12 1339 09/16/12 1609     Assessment:  Day # 4 Vancomycin in an 79 y/o female for Staph coag [-] bacteremia, Enterococcal UTI  Renal function has improved since admission, Scr 0.5 > 0.37.  Patient is Afebrile, WBC down to 5.4.  Goal of Therapy:   Vancomycin trough level 15-20 mcg/ml  Plan:   Change Vancomycin to 750 mg IV q 12 hours.  Will check Vancomycin level at [facility]. Follow up SCr, UOP, cultures, clinical course and adjust as clinically indicated.   Laurena Bering, Pharm.D.  09/19/2012 2:07 PM

## 2012-09-19 NOTE — Progress Notes (Signed)
Current chart and old chart reviewed. Discussed with Jenny Brady, nurse practitioner. Plans noted to hold off on flexible sigmoidoscopy  [facility] Progress Note  Jenny Brady:[id number] DOB: 1926/03/02 DOA: 09/16/2012 PCP: [name], MD  Brief narrative: 79 year old [facility] resident of [facility]. Sent to the ER because of rectal bleeding, fever and urinary retention. She was found to be hypotensive in the emergency department. She was admitted to the ICU by critical care medicine. CT of the abdomen and pelvis did reveal fecal impaction and diffuse colonic diverticulosis as well as moderate bilateral renal pelvocaliectasis ureterectasis. She was somewhat hypoxemic and felt to have hospital acquired pneumonia and was placed on broad-spectrum antibiotics. She was felt to be dehydrated as evidenced by elevated serum lactate, azotemia and mild acute renal insufficiency. Patient had baseline normal renal function. She was hydrated. She received 2 units of packed red blood cells. She was on Coumadin prior to admission for suspected mural thrombus so she was also given 2 units of FFP to reverse her INR (3.14) in the setting of lower GI bleeding.   Assessment/Plan:  HYPOTENSION - hypovolemia and sepsis resolved  Acute respiratory failure with hypoxia Improved. No evidence of HCAP  Enterococcus UTI (lower urinary tract infection)  Pansensitive. Currently on vanco pending blood culture results  Coag negative staph bacteremia/sepsis (2/2 positive) Sensitivities pending.   Rectal bleed in the setting of coumadin and fecal impaction Resolved.  Discussed with Dr. Leone Payor. Unable to get consent for flex sig.  Would like to d/c coumadin. Discussed with Dr. Shirlee Latch, who did the TEE in April. TEE showed LV thrombus VS. LOOSE TRABECULAE. He recommends TTE with contrast. If no LV thrombus, can d/c coumadin. May not require flex sig, even if consent  obtained.  Hypokalemia Resolved. Mag ok    Coronary artery disease -stable-   CKD (chronic kidney disease) stage 3, GFR 30-59 ml/min -renal function stable/improving with good UOP  H/O ischemic left MCA stroke -April 2014 -residual right hemiparesis and now unable to communicate in [place] - only in [place] Prognosis at that time felt to be poor. Son refused palliative care consult at that time  Chronic systolic congestive heart failure, NYHA class 3 / RHF chronic -compensated -EF 20%-25% with diffuse hypokinesis via TEE April 2014 -Lasix/ACE I on hold due to recent DH/hypotension - given dementia and suspected poor PO intake chronically may not be great candidate for scheduled Lasix and instead would be best served by weight based dosing  Acute blood loss anemia stable -post 2 ubits PRBC since admit  Moderate pulmonary HTN/Moderate TR -see above re: RHF  Hyperthyroidism-s/p RAI admin On synthroid  Vascular Dementia Lacks capacity for consent  Long term use of anticoagulants /possible LV thrombus noted on TEE in April: see above  D/C tele  DVT prophylaxis: SCDs Code Status: Full Family Communication: none today  Son has power of attorney but other family members are applying for guardianship due to son not being reliable as advocate for patient (apparent alcoholic) - patient's family is working with lawyer [name] [phone] to obtain guardianship - all medical questions intended for family as well as any need for consent for procedures or treatments need to be directed to [name]  Per SW notes: Per [name], several attempts have been made to get in contact with pt's son, Jenny Brady, to no avail; Well checks via [employer], Letters mailed to his home, and calls made to son's phone number.   Per  chart review and discussion with Chiropodist, CSW department had difficulty getting in touch with pt's son and pt's niece, Jenny Brady became  contact person for pt; assisting with pt's placement at [facility].   [facility] filed for a motion to determine pt's competency and, if decided pt is incompetent, to have a person identified to assist with pt's decision making.  CSW attempted to phone pt's son-voicemail message was in [place]. CSW left voicemail message in both [place] and [place] requesting a return call. Jenny Brady provided son's home number [phone]). When CSW attempted to phone this number, a message stated this number is not accepting phone calls.   CSW spoke with [name] who stated she is currently out of town and requested Marisue Ivan Brady be added to list to allow Marisue Ivan to receive updates as well.   Disposition Plan: Transfer to Telemetry Isolation: None  Consultants: Gastroenterology  Procedures: None  Antibiotics: Zosyn 6/9 >>>6/11 Vancomycin 6/9 >>>  HPI/Subjective: unable  Objective: Blood pressure 128/70, pulse 88, temperature 98 F (36.7 C), temperature source Oral, resp. rate 18, height 5' 4.96" (1.65 m), weight 77.5 kg (170 lb 13.7 oz), SpO2 100.00%.  Intake/Output Summary (Last 24 hours) at 09/19/12 1406 Last data filed at 09/19/12 0900  Gross per 24 hour  Intake    820 ml  Output    850 ml  Net    -30 ml    Exam: Gen:  Alert. Won't answer questions or follow commands HEENT: disconjugate gaze Lungs: Clear to auscultation bilaterally without wheezes or crackles, RA Cardiovascular: Regular rate and rhythm without murmur gallop or rub normal S1 and S2, no peripheral edema or JVD Abdomen: normal BS. S, NT, ND Ext: No significant cyanosis, clubbing of bilateral lower extremities  Scheduled Meds: Scheduled Meds: . docusate  100 mg Per Tube BID  . insulin aspart  0-9 Units Subcutaneous Q4H  . levothyroxine  75 mcg Per Tube QAC breakfast  . memantine  5 mg Per Tube BID  . polyethylene glycol  17 g Per Tube Daily  . potassium chloride  20 mEq Per Tube TID  . protein supplement  1 scoop  Oral Daily  . sodium phosphate  1 enema Rectal Once  . vancomycin  750 mg Intravenous Q24H    Data Reviewed: Basic Metabolic Panel:  Recent Labs Lab 09/16/12 1338 09/16/12 1356 09/17/12 0545 09/18/12 0732 09/19/12 0505  NA 140 142 149* 147* 144  K 4.6 4.7 3.0* 2.8* 3.7  CL 100 104 112 110 111  CO2 27  --  30 28 25   GLUCOSE 268* 280* 137* 93 88  BUN 56* 69* 31* 20 17  CREATININE 0.89 1.20* 0.50 0.46* 0.37*  CALCIUM 9.7  --  8.5 8.4 8.7  MG  --   --  2.0  --  1.8  PHOS  --   --  3.7  --   --    Liver Function Tests:  Recent Labs Lab 09/16/12 1338 09/18/12 0732  AST 28 17  ALT 21 16  ALKPHOS 86 71  BILITOT 0.5 0.7  PROT 7.2 5.9*  ALBUMIN 2.3* 2.0*    Recent Labs Lab 09/16/12 1338  LIPASE 32   CBC:  Recent Labs Lab 09/16/12 1338  09/17/12 0545 09/17/12 1600 09/17/12 2314 09/18/12 0732 09/19/12 0505  WBC 11.9*  < > 8.8 9.0 9.0 7.5 5.4  NEUTROABS 10.0*  --   --   --   --   --   --  HGB 12.9  < > 9.7* 10.9* 10.6* 10.5* 11.1*  HCT 39.9  < > 30.0* 34.1* 33.0* 33.1* 34.4*  MCV 80.1  < > 82.0 83.0 82.3 82.5 82.3  PLT 259  < > 140* 178 164 180 204  < > = values in this interval not displayed. Cardiac Enzymes:  Recent Labs Lab 09/17/12 0117  TROPONINI <0.30   BNP (last 3 results)  Recent Labs  07/21/12 1635  PROBNP 9584.0*   CBG:  Recent Labs Lab 09/18/12 2008 09/19/12 0016 09/19/12 0421 09/19/12 0743 09/19/12 1131  GLUCAP 85 94 91 102* 136*    Recent Results (from the past 240 hour(s))  CULTURE, BLOOD (ROUTINE X 2)     Status: None   Collection Time    09/16/12  1:45 PM      Result Value Range Status   Specimen Description BLOOD ARM LEFT   Final   Special Requests BOTTLES DRAWN AEROBIC AND ANAEROBIC 10CC   Final   Culture  Setup Time 09/16/2012 16:39   Final   Culture     Final   Value: STAPHYLOCOCCUS SPECIES (COAGULASE NEGATIVE)     Note: Gram Stain Report Called to,Read Back By and Verified With: MONICA L @ 1240 09/17/12 BY KRAWS    Report Status PENDING   Incomplete  CULTURE, BLOOD (ROUTINE X 2)     Status: None   Collection Time    09/16/12  2:10 PM      Result Value Range Status   Specimen Description BLOOD ARM LEFT   Final   Special Requests BOTTLES DRAWN AEROBIC AND ANAEROBIC 10CC   Final   Culture  Setup Time 09/16/2012 16:39   Final   Culture     Final   Value: STAPHYLOCOCCUS SPECIES (COAGULASE NEGATIVE)     Note: RIFAMPIN AND GENTAMICIN SHOULD NOT BE USED AS SINGLE DRUGS FOR TREATMENT OF STAPH INFECTIONS.     Note: Gram Stain Report Called to,Read Back By and Verified With: DOROTHY MUHORO ON 09/17/2012 AT 8:59P BY WILEJ   Report Status PENDING   Incomplete  URINE CULTURE     Status: None   Collection Time    09/16/12  2:27 PM      Result Value Range Status   Specimen Description URINE, CATHETERIZED   Final   Special Requests NONE   Final   Culture  Setup Time 09/16/2012 14:47   Final   Colony Count >=100,000 COLONIES/ML   Final   Culture ENTEROCOCCUS SPECIES   Final   Report Status 09/18/2012 FINAL   Final   Organism ID, Bacteria ENTEROCOCCUS SPECIES   Final  MRSA PCR SCREENING     Status: None   Collection Time    09/16/12  5:13 PM      Result Value Range Status   MRSA by PCR NEGATIVE  NEGATIVE Final   Comment:            The GeneXpert MRSA Assay (FDA     approved for NASAL specimens     only), is one component of a     comprehensive MRSA colonization     surveillance program. It is not     intended to diagnose MRSA     infection nor to guide or     monitor treatment for     MRSA infections.  URINE CULTURE     Status: None   Collection Time    09/16/12  5:45 PM      Result Value Range  Status   Specimen Description URINE, CATHETERIZED   Final   Special Requests NONE   Final   Culture  Setup Time 09/16/2012 21:21   Final   Colony Count NO GROWTH   Final   Culture NO GROWTH   Final   Report Status 09/18/2012 FINAL   Final   Crista Curb, M.D. [phone]  If 7PM-7AM, please contact  night-coverage [email or web address] Password [email or web address] 09/19/2012, 2:06 PM   LOS: 3 days

## 2012-09-19 NOTE — Progress Notes (Signed)
NUTRITION FOLLOW UP  Intervention:   1.  Continue advancments of Jevity 1.2 to goal as ordered.  Jevity 1.2 @ 60 mL/hr with beneprotein once daily meets 100% estimated energy and protein needs. 2.  Diet per SLP  Nutrition Dx:   Inadequate oral intake related to inability to eat as evidenced by NPO status.   Goal:  Initiate enteral nutrition; intake to meet at least 90% of estimated needs.   Monitor:  weight trends, lab trends, I/O's, enteral nutrition initiation, diet initiation   Assessment:   Admitted with GIB. Hx of CVA, aphasic. From [facility]. Pt's HCPOA is an attorney while family matters being settled. CT of abd revealed fecal impaction, diffuse colonic diverticulosis. Pt has PEG; feeding tube placed 4/25 2/2 pt unable to meet nutrition needs on dysphagia 1, pudding thickened liquid diet.  Pt seen by GI for rectal bleeding.  TF resumed by MD.  Pt currently receiving Jevity 1.2 @ 50 mL/hr with beneprotein once daily providing 1464 kcal, 72g protein. Plans to advance to 60 mL/hr with beneprotein once daily to provide 1752 kcal, 85g protein, 1180 mL free water.   Discussed with RN who reports tolerance, +BMs, and decreased residuals.   Height: Ht Readings from Last 1 Encounters:  09/16/12 5' 4.96" (1.65 m)    Weight Status:   Wt Readings from Last 1 Encounters:  09/18/12 170 lb 13.7 oz (77.5 kg)    Re-estimated needs:  Kcal: 1600-1800 Protein: 75-85g Fluid: 1.6-1.8 L/day  Skin: no issues noted  Diet Order: NPO   Intake/Output Summary (Last 24 hours) at 09/19/12 1441 Last data filed at 09/19/12 0900  Gross per 24 hour  Intake    820 ml  Output    850 ml  Net    -30 ml    Last BM: 6/12, per RN  Labs:   Recent Labs Lab 09/16/12 1356 09/17/12 0545 09/18/12 0732 09/19/12 0505  NA 142 149* 147* 144  K 4.7 3.0* 2.8* 3.7  CL 104 112 110 111  CO2  --  30 28 25   BUN 69* 31* 20 17  CREATININE 1.20* 0.50 0.46* 0.37*  CALCIUM  --  8.5 8.4 8.7  MG  --   2.0  --  1.8  PHOS  --  3.7  --   --   GLUCOSE 280* 137* 93 88    CBG (last 3)   Recent Labs  09/19/12 0421 09/19/12 0743 09/19/12 1131  GLUCAP 91 102* 136*    Scheduled Meds: . docusate  100 mg Per Tube BID  . insulin aspart  0-9 Units Subcutaneous Q4H  . levothyroxine  75 mcg Per Tube QAC breakfast  . memantine  5 mg Per Tube BID  . polyethylene glycol  17 g Per Tube Daily  . potassium chloride  20 mEq Per Tube TID  . protein supplement  1 scoop Oral Daily  . sodium phosphate  1 enema Rectal Once  . vancomycin  750 mg Intravenous Q12H    Continuous Infusions: . sodium chloride 50 mL/hr at 09/18/12 1500  . sodium chloride 20 mL/hr at 09/18/12 1849  . feeding supplement (JEVITY 1.2 CAL) 1,000 mL (09/19/12 0533)    Loyce Dys, MS RD LDN Clinical Inpatient Dietitian Pager: [phone] Weekend/After hours pager: [phone]

## 2012-09-20 DIAGNOSIS — R6510 Systemic inflammatory response syndrome (SIRS) of non-infectious origin without acute organ dysfunction: Secondary | ICD-10-CM

## 2012-09-20 DIAGNOSIS — B952 Enterococcus as the cause of diseases classified elsewhere: Secondary | ICD-10-CM

## 2012-09-20 LAB — BASIC METABOLIC PANEL
CO2: 29 mEq/L (ref 19–32)
Calcium: 8.5 mg/dL (ref 8.4–10.5)
GFR calc non Af Amer: >90 mL/min (ref >90)
Potassium: 4.4 mEq/L (ref 3.5–5.1)
Sodium: 140 mEq/L (ref 135–145)

## 2012-09-20 LAB — CULTURE, BLOOD (ROUTINE X 2)

## 2012-09-20 LAB — GLUCOSE, CAPILLARY
Glucose-Capillary: 164 mg/dL — ABNORMAL HIGH (ref 70–99)
Glucose-Capillary: 158 mg/dL — ABNORMAL HIGH (ref 70–99)
Glucose-Capillary: 146 mg/dL — ABNORMAL HIGH (ref 70–99)

## 2012-09-20 LAB — HEMOGLOBIN AND HEMATOCRIT, BLOOD: HCT: 37.8 % (ref 36.0–46.0)

## 2012-09-20 MED ORDER — VANCOMYCIN HCL 10 G IV SOLR
1250.0000 mg | Freq: Two times a day (BID) | INTRAVENOUS | Status: DC
  Administered 2012-09-20 – 2012-09-25 (×10): 1250 mg via INTRAVENOUS
  Filled 2012-09-20 (×12): qty 1250

## 2012-09-20 NOTE — Clinical Social Work Note (Addendum)
CSW advised in progression that patient's niece wants to talk with CSW regarding patient going to [facility] at discharge. Call made to [facility] at [phone] and spoke with Lenis Dickinson as Mackley was in surgery. She was expecting call from CSW and appreciative of contact. She and family (sister Holthaus and their mother Alvino Chapel) don't want patient to return to [facility], but instead want [facility] as they know people who have been there and feel patient will receive better care.  CSW informed that she, her sister and their mother are the ones pursuing guardianship of patient as they want what is best for patient.  CSW explained that information will be sent to requested facility and they will be kept updated.  Genelle Bal, MSW, LCSW [phone]

## 2012-09-20 NOTE — Progress Notes (Addendum)
[facility] Progress Note  Jenny Brady YQM:[id number] DOB: 03/18/1926 DOA: 09/16/2012 PCP: Jenny Peck, MD  Brief narrative: 79 year old female resident of [facility]. Sent to the ER because of rectal bleeding, fever and urinary retention. She was found to be hypotensive in the emergency department due to sepsis and rectal bleed. She was admitted to the ICU by critical care medicine. CT of the abdomen and pelvis did reveal fecal impaction and diffuse colonic diverticulosis as well as moderate bilateral renal pelvocaliectasis ureterectasis. Found to have enterococcal UTI and now growing 2 out of 2 blood cultures with coag negative staph, sensitive only to vancomycin and rifampin.     She received 2 units of packed red blood cells. She was on Coumadin prior to admission for suspected mural thrombus so she was also given 2 units of FFP to reverse her INR (3.14) in the setting of lower GI bleeding. Coumadin will not be resumed, as transthoracic echocardiogram with contrast shows no vegetation. See below. No further bleeding. Flexible sigmoidoscopy has been canceled/postponed, as there is no one available to give consent. Also, bleeding has stopped and Coumadin will not be resumed.  Assessment/Plan:  HYPOTENSION - acute blood loss and sepsis resolved  Acute respiratory failure with hypoxia Improved. No evidence of HCAP  Enterococcus UTI (lower urinary tract infection)  Pansensitive. Continue vancomycin  Coag negative staph bacteremia/sepsis (2/2 positive) Sensitive only to vancomycin and rifampin. Etiology not clear. Have discussed the case with Jenny Brady who will consult. Would not be a candidate for repeat transesophageal echocardiogram, as there is no one to give consent. Also, per Jenny Brady, transthoracic echocardiogram with contrast is quite sensitive with regard to the area in question on previous transesophageal echocardiogram anterior LV. Even on  previous TEE, it was not a definitive thrombus. Continue vancomycin for now  Rectal bleed in the setting of coumadin and fecal impaction Resolved.  Discussed with Jenny Brady. Unable to get consent for flex sig. Discussed with Jenny Brady, who did the TEE in April. TEE showed LV thrombus VS. LOOSE TRABECULAE. He recommends TTE with contrast. If no LV thrombus, can d/c coumadin. May not require flex sig, even if consent obtained. Will not resume Coumadin    Coronary artery disease -stable-   CKD (chronic kidney disease) stage 3, GFR 30-59 ml/min -renal function stable/improving with good UOP  H/O ischemic left MCA stroke -April 2014 Patient is bed and chair bound, post PEG tube, nonverbal.  Chronic systolic congestive heart failure, NYHA class 3 / RHF chronic -compensated -EF 15%  Acute blood loss anemia stable -post 2 ubits PRBC since admit  Moderate pulmonary HTN/Moderate TR -see above re: RHF  Hyperthyroidism-s/p RAI admin On synthroid  Vascular Dementia Lacks capacity for consent  DVT prophylaxis: SCDs Code Status: Full Family Communication: none today  Son has power of attorney but other family members are applying for guardianship due to son not being reliable as advocate for patient (apparent alcoholic) - patient's family is working with lawyer Jenny Brady [phone] to obtain guardianship - all medical questions intended for family as well as any need for consent for procedures or treatments need to be directed to [name]  Per SW notes: Per Jenny Brady, several attempts have been made to get in contact with pt's son, Jenny Brady, to no avail; Well checks via Jenny Brady, Letters mailed to his home, and calls made to son's phone number.   Per chart review and discussion with Chiropodist, CSW department had  difficulty getting in touch with pt's son and pt's niece, Jenny Brady became contact person for pt; assisting with pt's placement at [facility].    [facility] filed for a motion to determine pt's competency and, if decided pt is incompetent, to have a person identified to assist with pt's decision making.  CSW attempted to phone pt's son-voicemail message was in [place]. CSW left voicemail message in both [place] and [place] requesting a return call. Jenny Brady provided son's home number [phone]). When CSW attempted to phone this number, a message stated this number is not accepting phone calls.   CSW spoke with Jenny Brady who stated she is currently out of town and requested Jenny Brady be added to list to allow Jenny Ivan to receive updates as well.   Disposition Plan: Family now requesting [facility] nursing facility  Consultants: Gastroenterology ID  Procedures: None  Antibiotics: Zosyn 6/9 >>>6/11 Vancomycin 6/9 >>>  HPI/Subjective: Unable. Per nursing staff, no significant skin breakdown. He had a soft nonbloody stool today. No problems reported.  Objective: Blood pressure 122/72, pulse 89, temperature 98.4 F (36.9 C), temperature source Axillary, resp. rate 19, height 5' 4.96" (1.65 m), weight 78.8 kg (173 lb 11.6 oz), SpO2 100.00%.  Intake/Output Summary (Last 24 hours) at 09/20/12 1329 Last data filed at 09/20/12 1127  Gross per 24 hour  Intake      0 ml  Output   1490 ml  Net  -1490 ml    Exam: Gen:  Alert. Nonverbal. Nods "yes" to all questions I ask. Does not follow commands HEENT: disconjugate gaze, does appear to be tracking Lungs: Clear to auscultation bilaterally without wheezes or crackles, RA Cardiovascular: Regular rate and rhythm without murmur gallop or rub normal S1 and S2, no peripheral edema or JVD Abdomen: normal BS. S, NT, ND Ext: No significant cyanosis, clubbing of bilateral lower extremities  Scheduled Meds: Scheduled Meds: . cyanocobalamin  500 mcg Per Tube Daily  . docusate  100 mg Per Tube BID  . insulin aspart  0-9 Units Subcutaneous Q4H  . levothyroxine  75  mcg Per Tube QAC breakfast  . memantine  5 mg Per Tube BID  . pantoprazole sodium  40 mg Per Tube QHS  . polyethylene glycol  17 g Per Tube Daily  . potassium chloride  20 mEq Per Tube TID  . protein supplement  1 scoop Oral Daily  . simvastatin  20 mg Per Tube q1800  . sodium phosphate  1 enema Rectal Once  . vancomycin  750 mg Intravenous Q12H    Data Reviewed: Basic Metabolic Panel:  Recent Labs Lab 09/16/12 1338 09/16/12 1356 09/17/12 0545 09/18/12 0732 09/19/12 0505  NA 140 142 149* 147* 144  K 4.6 4.7 3.0* 2.8* 3.7  CL 100 104 112 110 111  CO2 27  --  30 28 25   GLUCOSE 268* 280* 137* 93 88  BUN 56* 69* 31* 20 17  CREATININE 0.89 1.20* 0.50 0.46* 0.37*  CALCIUM 9.7  --  8.5 8.4 8.7  MG  --   --  2.0  --  1.8  PHOS  --   --  3.7  --   --    Liver Function Tests:  Recent Labs Lab 09/16/12 1338 09/18/12 0732  AST 28 17  ALT 21 16  ALKPHOS 86 71  BILITOT 0.5 0.7  PROT 7.2 5.9*  ALBUMIN 2.3* 2.0*    Recent Labs Lab 09/16/12 1338  LIPASE 32   CBC:  Recent Labs Lab 09/16/12 1338  09/17/12 0545 09/17/12 1600 09/17/12 2314 09/18/12 0732 09/19/12 0505  WBC 11.9*  < > 8.8 9.0 9.0 7.5 5.4  NEUTROABS 10.0*  --   --   --   --   --   --   HGB 12.9  < > 9.7* 10.9* 10.6* 10.5* 11.1*  HCT 39.9  < > 30.0* 34.1* 33.0* 33.1* 34.4*  MCV 80.1  < > 82.0 83.0 82.3 82.5 82.3  PLT 259  < > 140* 178 164 180 204  < > = values in this interval not displayed. Cardiac Enzymes:  Recent Labs Lab 09/17/12 0117  TROPONINI <0.30   BNP (last 3 results)  Recent Labs  07/21/12 1635  PROBNP 9584.0*   CBG:  Recent Labs Lab 09/19/12 2002 09/19/12 2354 09/20/12 0358 09/20/12 0741 09/20/12 1132  GLUCAP 174* 171* 164* 193* 169*    Recent Results (from the past 240 hour(s))  CULTURE, BLOOD (ROUTINE X 2)     Status: None   Collection Time    09/16/12  1:45 PM      Result Value Range Status   Specimen Description BLOOD ARM LEFT   Final   Special Requests  BOTTLES DRAWN AEROBIC AND ANAEROBIC 10CC   Final   Culture  Setup Time 09/16/2012 16:39   Final   Culture     Final   Value: STAPHYLOCOCCUS SPECIES (COAGULASE NEGATIVE)     Note: SUSCEPTIBILITIES PERFORMED ON PREVIOUS CULTURE WITHIN THE LAST 5 DAYS.     Note: Gram Stain Report Called to,Read Back By and Verified With: MONICA L @ 1240 09/17/12 BY KRAWS   Report Status 09/20/2012 FINAL   Final  CULTURE, BLOOD (ROUTINE X 2)     Status: None   Collection Time    09/16/12  2:10 PM      Result Value Range Status   Specimen Description BLOOD ARM LEFT   Final   Special Requests BOTTLES DRAWN AEROBIC AND ANAEROBIC 10CC   Final   Culture  Setup Time 09/16/2012 16:39   Final   Culture     Final   Value: STAPHYLOCOCCUS SPECIES (COAGULASE NEGATIVE)     Note: RIFAMPIN AND GENTAMICIN SHOULD NOT BE USED AS SINGLE DRUGS FOR TREATMENT OF STAPH INFECTIONS.     Note: Gram Stain Report Called to,Read Back By and Verified With: DOROTHY [facility] ON 09/17/2012 AT 8:59P BY WILEJ   Report Status 09/20/2012 FINAL   Final   Organism ID, Bacteria STAPHYLOCOCCUS SPECIES (COAGULASE NEGATIVE)   Final  URINE CULTURE     Status: None   Collection Time    09/16/12  2:27 PM      Result Value Range Status   Specimen Description URINE, CATHETERIZED   Final   Special Requests NONE   Final   Culture  Setup Time 09/16/2012 14:47   Final   Colony Count >=100,000 COLONIES/ML   Final   Culture ENTEROCOCCUS SPECIES   Final   Report Status 09/18/2012 FINAL   Final   Organism ID, Bacteria ENTEROCOCCUS SPECIES   Final  MRSA PCR SCREENING     Status: None   Collection Time    09/16/12  5:13 PM      Result Value Range Status   MRSA by PCR NEGATIVE  NEGATIVE Final   Comment:            The GeneXpert MRSA Assay (FDA     approved for NASAL specimens     only), is  one component of a     comprehensive MRSA colonization     surveillance program. It is not     intended to diagnose MRSA     infection nor to guide or     monitor  treatment for     MRSA infections.  URINE CULTURE     Status: None   Collection Time    09/16/12  5:45 PM      Result Value Range Status   Specimen Description URINE, CATHETERIZED   Final   Special Requests NONE   Final   Culture  Setup Time 09/16/2012 21:21   Final   Colony Count NO GROWTH   Final   Culture NO GROWTH   Final   Report Status 09/18/2012 FINAL   Final   Crista Curb, M.D. [phone]  If 7PM-7AM, please contact night-coverage [email or web address] Password [email or web address] 09/20/2012, 1:29 PM   LOS: 4 days

## 2012-09-20 NOTE — Clinical Social Work Placement (Addendum)
Clinical Social Work Department CLINICAL SOCIAL WORK PLACEMENT NOTE 09/20/2012  Patient:  Jenny Brady, Jenny Brady  Account Number:  [id number] Admit date:  09/16/2012  Clinical Social Worker:  Genelle Bal, LCSW  Date/time:  09/20/2012 02:26 AM  Clinical Social Work is seeking post-discharge placement for this patient at the following level of care:   SKILLED NURSING   (*CSW will update this form in Epic as items are completed)     Patient/family provided with [facility] Department of Clinical Social Work's list of facilities offering this level of care within the geographic area requested by the patient (or if unable, by the patient's family).  09/20/2012  Patient/family informed of their freedom to choose among providers that offer the needed level of care, that participate in Medicare, Medicaid or managed care program needed by the patient, have an available bed and are willing to accept the patient.    Patient/family informed of [facility]' ownership interest in [facility], as well as of the fact that they are under no obligation to receive care at this facility.  PASARR submitted to EDS on  PASARR number received from EDS on patient has pre-existing PASARR - [id number] [id number]   FL2 transmitted to all facilities in geographic area requested by pt/family on  09/20/2012 FL2 transmitted to all facilities within larger geographic area on   Patient informed that his/her managed care company has contracts with or will negotiate with  certain facilities, including the following:     Patient/family informed of bed offers received: 09/21/12  Patient chooses bed at [facility] Physician recommends and patient chooses bed at    Patient to be transferred to [facility] on 09/25/12  Patient to be transferred to facility by ambulance  The following physician request were entered in Epic:   Additional Comments: 09/25/12 - [place] completed admissions paperwork at [facility] and aware of  today's discharge.

## 2012-09-20 NOTE — Consult Note (Signed)
[facility]  Total days of antibiotics 5        Day 5 vanco        ( 3 days of piptazo d/c'd 6/11)       Reason for Consult: CoNs bacteremia    Referring Physician: Lendell Caprice  Active Problems:   HYPOTENSION   Hyperthyroidism-s/p RAI admin   Coronary artery disease   GERD (gastroesophageal reflux disease)   CKD (chronic kidney disease) stage 3, GFR 30-59 ml/min   H/O ischemic left MCA stroke   Chronic systolic congestive heart failure, NYHA class 3   UTI (lower urinary tract infection)   Dementia with behavioral disturbance   Dysphagia, unspecified(787.20)   Long term (current) use of anticoagulants   Rectal bleed   Sepsis   HCAP (healthcare-associated pneumonia)   Acute respiratory failure with hypoxia   Mural thrombus of heart   Anemia of chronic disease   Fecal impaction    HPI: Jenny Brady is a 79 y.o. female with mod-severe dementia, who suffered acute L MCA stroke in mid April 2014 c/b aphasia. She was hospitalize from 4/13-5/6 treated for HCAP, UTI at that time in addition to management of her stroke. Work up revealed small thrombus anterior wall of LV. [facility] but subsequently readmitted on 6/9 for rectal bleeding, urinary retention and fever. She was found febrile, hypotensive, with leukocytosis in the ED. Started on broad spectrum antibiotics of vanco and piptazo. abd ct showed fecal impaction. Urinary retention resolved with foley placement. She underwent Flex sig to evaluate rectal bleeding which was thought to be due stercoral ulcer from impaction, now improved. Her infectious work up revealed her to have CoNS bacteremia in 2 sets blood cx and interestingly had 2 urine cx sent 2 hrs apart where one identified 100K CFU of enteroccocus (amp S) adn the other NGTD. She remains on vancomycin for bacteremia. She had repeat TTE with contrast to evaluate for thrombus which is no longer visible.  ID consultation  sought to help with antibiotic duration and if any further work up needed  Past Medical History  Diagnosis Date  . Hyperthyroidism     had RAI-is on thyroid replacement, happened maybe this summer  . Coronary artery disease ?, before 2005    Had a catheterization per son by Dr. [name] in 1991 no reports in  [place].  note from 2005 mentions hx of coronary stent  . GERD (gastroesophageal reflux disease)   . Stroke 07/2012    Right hemiplegia, dysphagia, aphasia.  . Dementia   . Aphasia 07/2012  . Chronic systolic heart failure     07/2012 the EF was 15%  . Mural thrombus of cardiac apex   . Obesity   . IBS (irritable bowel syndrome)   . DVT (deep venous thrombosis) before 2005    occurred after trauma, treated for a while with Coumadin.   Marland Kitchen Dysphagia due to recent stroke 07/2012    ok'd for purree/pudding thick liquids but G tube placed by IR  . OSA (obstructive sleep apnea)     refused CPAP per notes.     Allergies:  Allergies  Allergen Reactions  . Hyoscyamine   . Ivp Dye (Iodinated Diagnostic Agents)   . Septra (Sulfamethoxazole W-Trimethoprim)      MEDICATIONS: . cyanocobalamin  500 mcg Per Tube Daily  . docusate  100 mg Per Tube BID  . insulin aspart  0-9 Units Subcutaneous Q4H  .  levothyroxine  75 mcg Per Tube QAC breakfast  . memantine  5 mg Per Tube BID  . pantoprazole sodium  40 mg Per Tube QHS  . polyethylene glycol  17 g Per Tube Daily  . protein supplement  1 scoop Oral Daily  . simvastatin  20 mg Per Tube q1800  . sodium phosphate  1 enema Rectal Once  . vancomycin  750 mg Intravenous Q12H    History  Substance Use Topics  . Smoking status: Never Smoker   . Smokeless tobacco: Never Used  . Alcohol Use: No    Family History  Problem Relation Age of Onset  . Acne Mother     died giving birth to 8th kid  . Dementia Father     died 23  . Acne Brother     died 2-3 yrs ago  . Cervical cancer Sister      Unable to obtain due to  aphasia/dementia  OBJECTIVE: Temp:  [98 F (36.7 C)-98.8 F (37.1 C)] 98.7 F (37.1 C) (06/13 1753) Pulse Rate:  [81-93] 93 (06/13 1753) Resp:  [18-20] 18 (06/13 1753) BP: (100-128)/(53-76) 116/67 mmHg (06/13 1753) SpO2:  [95 %-100 %] 100 % (06/13 1753) Weight:  [173 lb 11.6 oz (78.8 kg)] 173 lb 11.6 oz (78.8 kg) (06/13 0406)  Physical Exam  Constitutional: does not orient to name. appears well-developed and well-nourished. No distress. Not following commands HENT:  Mouth/Throat: Oropharynx is clear . No oropharyngeal exudate. Dry oral mucosa Cardiovascular: Normal rate, regular rhythm and normal heart sounds. Exam reveals no gallop and no friction rub.  s1s2 with 2/6 SM  Pulmonary/Chest: Effort normal and breath sounds normal. No respiratory distress. He has no wheezes.  Abdominal: Soft. Bowel sounds are normal. He exhibits no distension. There is no tenderness. PEg in place Lymphadenopathy:  no cervical adenopathy.  Ext: echymosis to extremities Neurological: left upper extremity has increased tone and rigidity.  Skin: Skin is warm and dry. No rash noted. No erythema.  Psychiatric: deferred   LABS: Results for orders placed during the hospital encounter of 09/16/12 (from the past 48 hour(s))  GLUCOSE, CAPILLARY     Status: None   Collection Time    09/18/12  8:08 PM      Result Value Range   Glucose-Capillary 85  70 - 99 mg/dL  GLUCOSE, CAPILLARY     Status: None   Collection Time    09/19/12 12:16 AM      Result Value Range   Glucose-Capillary 94  70 - 99 mg/dL  GLUCOSE, CAPILLARY     Status: None   Collection Time    09/19/12  4:21 AM      Result Value Range   Glucose-Capillary 91  70 - 99 mg/dL  BASIC METABOLIC PANEL     Status: Abnormal   Collection Time    09/19/12  5:05 AM      Result Value Range   Sodium 144  135 - 145 mEq/L   Potassium 3.7  3.5 - 5.1 mEq/L   Comment: DELTA CHECK NOTED   Chloride 111  96 - 112 mEq/L   CO2 25  19 - 32 mEq/L   Glucose, Bld  88  70 - 99 mg/dL   BUN 17  6 - 23 mg/dL   Creatinine, Ser 1.61 (*) 0.50 - 1.10 mg/dL   Calcium 8.7  8.4 - 09.6 mg/dL   GFR calc non Af Amer >90  >90 mL/min   GFR calc Af Amer >90  >  90 mL/min   Comment:            The eGFR has been calculated     using the CKD EPI equation.     This calculation has not been     validated in all clinical     situations.     eGFR's persistently     <90 mL/min signify     possible Chronic Kidney Disease.  CBC     Status: Abnormal   Collection Time    09/19/12  5:05 AM      Result Value Range   WBC 5.4  4.0 - 10.5 K/uL   RBC 4.18  3.87 - 5.11 MIL/uL   Hemoglobin 11.1 (*) 12.0 - 15.0 g/dL   HCT 16.1 (*) 09.6 - 04.5 %   MCV 82.3  78.0 - 100.0 fL   MCH 26.6  26.0 - 34.0 pg   MCHC 32.3  30.0 - 36.0 g/dL   RDW 40.9 (*) 81.1 - 91.4 %   Platelets 204  150 - 400 K/uL  MAGNESIUM     Status: None   Collection Time    09/19/12  5:05 AM      Result Value Range   Magnesium 1.8  1.5 - 2.5 mg/dL  GLUCOSE, CAPILLARY     Status: Abnormal   Collection Time    09/19/12  7:43 AM      Result Value Range   Glucose-Capillary 102 (*) 70 - 99 mg/dL  GLUCOSE, CAPILLARY     Status: Abnormal   Collection Time    09/19/12 11:31 AM      Result Value Range   Glucose-Capillary 136 (*) 70 - 99 mg/dL  GLUCOSE, CAPILLARY     Status: Abnormal   Collection Time    09/19/12  4:08 PM      Result Value Range   Glucose-Capillary 176 (*) 70 - 99 mg/dL  GLUCOSE, CAPILLARY     Status: Abnormal   Collection Time    09/19/12  8:02 PM      Result Value Range   Glucose-Capillary 174 (*) 70 - 99 mg/dL  GLUCOSE, CAPILLARY     Status: Abnormal   Collection Time    09/19/12 11:54 PM      Result Value Range   Glucose-Capillary 171 (*) 70 - 99 mg/dL  GLUCOSE, CAPILLARY     Status: Abnormal   Collection Time    09/20/12  3:58 AM      Result Value Range   Glucose-Capillary 164 (*) 70 - 99 mg/dL  GLUCOSE, CAPILLARY     Status: Abnormal   Collection Time    09/20/12  7:41 AM       Result Value Range   Glucose-Capillary 193 (*) 70 - 99 mg/dL  GLUCOSE, CAPILLARY     Status: Abnormal   Collection Time    09/20/12 11:32 AM      Result Value Range   Glucose-Capillary 169 (*) 70 - 99 mg/dL  HEMOGLOBIN AND HEMATOCRIT, BLOOD     Status: None   Collection Time    09/20/12  4:50 PM      Result Value Range   Hemoglobin 12.1  12.0 - 15.0 g/dL   HCT 78.2  95.6 - 21.3 %  BASIC METABOLIC PANEL     Status: Abnormal   Collection Time    09/20/12  4:50 PM      Result Value Range   Sodium 140  135 - 145 mEq/L   Potassium  4.4  3.5 - 5.1 mEq/L   Chloride 104  96 - 112 mEq/L   CO2 29  19 - 32 mEq/L   Glucose, Bld 136 (*) 70 - 99 mg/dL   BUN 10  6 - 23 mg/dL   Creatinine, Ser 9.52 (*) 0.50 - 1.10 mg/dL   Calcium 8.5  8.4 - 84.1 mg/dL   GFR calc non Af Amer >90  >90 mL/min   GFR calc Af Amer >90  >90 mL/min   Comment:            The eGFR has been calculated     using the CKD EPI equation.     This calculation has not been     validated in all clinical     situations.     eGFR's persistently     <90 mL/min signify     possible Chronic Kidney Disease.  GLUCOSE, CAPILLARY     Status: Abnormal   Collection Time    09/20/12  5:23 PM      Result Value Range   Glucose-Capillary 158 (*) 70 - 99 mg/dL    MICRO: 6/9 blood cx CoNS -vanco S MIC 2 6/9 urine cx NGTD 6/9 urine cx enterococcus (amp S)  Assessment/Plan:  79yo F with dementia, L-MCA stroke/aphasia presents with SIRS and rectal bleeding from fecal impaction found to have enterococcal uti and coagulase negative staph bacteremia with past history of mural thrombus in LV on TEE from 07/26/12.  - please repeat blood cx to see that she has cleared her bacteremia - ideally a repeat TEE would be helpful however due to social circumstance of lawyer being the temporary POA, this is unattainable at this time. It is reassuring that the TTE with contrast did not see any residual thrombus - recommend to treat for at least 2 wks.  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 - if she has repeat fevers of greater than 100.3 , would repeat blood cx, urine cx, and cxr.  John campbell to provider further recs over the weekend.

## 2012-09-20 NOTE — Progress Notes (Signed)
ANTIBIOTIC CONSULT NOTE - FOLLOW UP  Pharmacy Consult for Vancomycin Indication: CoNS bacteremia; Enterococcal UTI  Allergies  Allergen Reactions  . Hyoscyamine   . Ivp Dye (Iodinated Diagnostic Agents)   . Septra (Sulfamethoxazole W-Trimethoprim)   Patient Measurements: Height: 5' 4.96" (165 cm) Weight: 167 lb 12.3 oz (76.1 kg) IBW/kg (Calculated) : 56.91 Vital Signs: Temp: 98.5 F (36.9 C) (06/13 1951) Temp src: Oral (06/13 1951) BP: 130/61 mmHg (06/13 1951) Pulse Rate: 93 (06/13 1951) Intake/Output from previous day: 06/12 0701 - 06/13 0700 In: 150 [IV Piggyback:150] Out: 1340 [Urine:1340] Labs:  Recent Labs  09/17/12 2314 09/18/12 0732 09/19/12 0505 09/20/12 1650  WBC 9.0 7.5 5.4  --   HGB 10.6* 10.5* 11.1* 12.1  PLT 164 180 204  --   CREATININE  --  0.46* 0.37* 0.33*   Estimated Creatinine Clearance: 53.4 ml/min (by C-G formula based on Cr of 0.33).  Recent Labs  09/20/12 1921  VANCOTROUGH 9.0*     Microbiology: Recent Results (from the past 720 hour(s))  CULTURE, BLOOD (ROUTINE X 2)     Status: None   Collection Time    09/16/12  1:45 PM      Result Value Range Status   Specimen Description BLOOD ARM LEFT   Final   Special Requests BOTTLES DRAWN AEROBIC AND ANAEROBIC 10CC   Final   Culture  Setup Time 09/16/2012 16:39   Final   Culture     Final   Value: STAPHYLOCOCCUS SPECIES (COAGULASE NEGATIVE)     Note: SUSCEPTIBILITIES PERFORMED ON PREVIOUS CULTURE WITHIN THE LAST 5 DAYS.     Note: Gram Stain Report Called to,Read Back By and Verified With: MONICA L @ 1240 09/17/12 BY KRAWS   Report Status 09/20/2012 FINAL   Final  CULTURE, BLOOD (ROUTINE X 2)     Status: None   Collection Time    09/16/12  2:10 PM      Result Value Range Status   Specimen Description BLOOD ARM LEFT   Final   Special Requests BOTTLES DRAWN AEROBIC AND ANAEROBIC 10CC   Final   Culture  Setup Time 09/16/2012 16:39   Final   Culture     Final   Value: STAPHYLOCOCCUS SPECIES  (COAGULASE NEGATIVE)     Note: RIFAMPIN AND GENTAMICIN SHOULD NOT BE USED AS SINGLE DRUGS FOR TREATMENT OF STAPH INFECTIONS.     Note: Gram Stain Report Called to,Read Back By and Verified With: DOROTHY [facility] ON 09/17/2012 AT 8:59P BY WILEJ   Report Status 09/20/2012 FINAL   Final   Organism ID, Bacteria STAPHYLOCOCCUS SPECIES (COAGULASE NEGATIVE)   Final  URINE CULTURE     Status: None   Collection Time    09/16/12  2:27 PM      Result Value Range Status   Specimen Description URINE, CATHETERIZED   Final   Special Requests NONE   Final   Culture  Setup Time 09/16/2012 14:47   Final   Colony Count >=100,000 COLONIES/ML   Final   Culture ENTEROCOCCUS SPECIES   Final   Report Status 09/18/2012 FINAL   Final   Organism ID, Bacteria ENTEROCOCCUS SPECIES   Final  MRSA PCR SCREENING     Status: None   Collection Time    09/16/12  5:13 PM      Result Value Range Status   MRSA by PCR NEGATIVE  NEGATIVE Final   Comment:            The GeneXpert MRSA Assay (  FDA     approved for NASAL specimens     only), is one component of a     comprehensive MRSA colonization     surveillance program. It is not     intended to diagnose MRSA     infection nor to guide or     monitor treatment for     MRSA infections.  URINE CULTURE     Status: None   Collection Time    09/16/12  5:45 PM      Result Value Range Status   Specimen Description URINE, CATHETERIZED   Final   Special Requests NONE   Final   Culture  Setup Time 09/16/2012 21:21   Final   Colony Count NO GROWTH   Final   Culture NO GROWTH   Final   Report Status 09/18/2012 FINAL   Final    Anti-infectives   Start     Dose/Rate Route Frequency Ordered Stop   09/19/12 2000  vancomycin (VANCOCIN) IVPB 750 mg/150 ml premix     750 mg 150 mL/hr over 60 Minutes Intravenous Every 12 hours 09/19/12 1414     09/18/12 1100  vancomycin (VANCOCIN) IVPB 750 mg/150 ml premix  Status:  Discontinued     750 mg 150 mL/hr over 60 Minutes Intravenous  Every 24 hours 09/18/12 1004 09/19/12 1414   09/17/12 1500  vancomycin (VANCOCIN) IVPB 1000 mg/200 mL premix  Status:  Discontinued     1,000 mg 200 mL/hr over 60 Minutes Intravenous Every 24 hours 09/16/12 1421 09/18/12 1004   09/16/12 2200  piperacillin-tazobactam (ZOSYN) IVPB 3.375 g  Status:  Discontinued     3.375 g 12.5 mL/hr over 240 Minutes Intravenous 3 times per day 09/16/12 1421 09/18/12 1514   09/16/12 1345  piperacillin-tazobactam (ZOSYN) IVPB 3.375 g     3.375 g 100 mL/hr over 30 Minutes Intravenous  Once 09/16/12 1339 09/16/12 1448   09/16/12 1345  vancomycin (VANCOCIN) IVPB 1000 mg/200 mL premix     1,000 mg 200 mL/hr over 60 Minutes Intravenous  Once 09/16/12 1339 09/16/12 1609      Assessment: 86 YOF with SIRS and fecal impaction found to have enterococcal UTI and coagulase negative staph bacteremia with a past history of a mural thrombus in LV on TEE from April on vancomycin day #5.   Vancomycin trough = 9 this pm on 750mg  IV q12h (This was at steady state prior to the fourth dose).  SCr 0.33/estimated CrCl~53.4 mL/min. Patient is currently afebrile and last wbc was within normal limits. Recorded UOP good (unsure how accurate).   6/9 vanc >> 6/9 zosyn >> 6/11  6/9 Urine >100K Enterococcus (pan sens) 6/9 BCx x 2 >> 2/2 GPC i>> Staph coag [-] > only SS Vanc/Rifam  Goal of Therapy:  Vancomycin trough level 15-20 mcg/ml  Plan:  1. Increase vancomycin to 1250mg  IV q12h with vancomycin trough at Css to confirm.  2. Monitor renal function closely and adjust therapy as needed.  3. Monitor plan for DOT and TEE if obtained.   Link Snuffer, PharmD, BCPS Clinical Pharmacist [phone]  09/20/2012,9:08 PM

## 2012-09-21 DIAGNOSIS — R7881 Bacteremia: Secondary | ICD-10-CM

## 2012-09-21 DIAGNOSIS — B957 Other staphylococcus as the cause of diseases classified elsewhere: Secondary | ICD-10-CM

## 2012-09-21 LAB — GLUCOSE, CAPILLARY: Glucose-Capillary: 145 mg/dL — ABNORMAL HIGH (ref 70–99)

## 2012-09-21 MED ORDER — FUROSEMIDE 10 MG/ML PO SOLN
40.0000 mg | Freq: Every day | ORAL | Status: DC
  Administered 2012-09-21: 40 mg
  Filled 2012-09-21 (×4): qty 4

## 2012-09-21 NOTE — Progress Notes (Signed)
Patient ID: Jenny Brady, female   DOB: [birth year], 79 y.o.   MRN: [id number]         [facility]    Date of Admission:  09/16/2012           Day 6 vancomycin  Active Problems:   HYPOTENSION   Hyperthyroidism-s/p RAI admin   Coronary artery disease   GERD (gastroesophageal reflux disease)   CKD (chronic kidney disease) stage 3, GFR 30-59 ml/min   H/O ischemic left MCA stroke   Chronic systolic congestive heart failure, NYHA class 3   UTI (lower urinary tract infection)   Dementia with behavioral disturbance   Dysphagia, unspecified(787.20)   Long term (current) use of anticoagulants   Rectal bleed   Sepsis   HCAP (healthcare-associated pneumonia)   Acute respiratory failure with hypoxia   Mural thrombus of heart   Anemia of chronic disease   Fecal impaction   . cyanocobalamin  500 mcg Per Tube Daily  . docusate  100 mg Per Tube BID  . insulin aspart  0-9 Units Subcutaneous Q4H  . levothyroxine  75 mcg Per Tube QAC breakfast  . memantine  5 mg Per Tube BID  . pantoprazole sodium  40 mg Per Tube QHS  . polyethylene glycol  17 g Per Tube Daily  . protein supplement  1 scoop Oral Daily  . simvastatin  20 mg Per Tube q1800  . sodium phosphate  1 enema Rectal Once  . vancomycin  1,250 mg Intravenous Q12H   Past Medical History  Diagnosis Date  . Hyperthyroidism     had RAI-is on thyroid replacement, happened maybe this summer  . Coronary artery disease ?, before 2005    Had a catheterization per son by Dr. [name] in 1991 no reports in  [place].  note from 2005 mentions hx of coronary stent  . GERD (gastroesophageal reflux disease)   . Stroke 07/2012    Right hemiplegia, dysphagia, aphasia.  . Dementia   . Aphasia 07/2012  . Chronic systolic heart failure     07/2012 the EF was 15%  . Mural thrombus of cardiac apex   . Obesity   . IBS (irritable bowel syndrome)   . DVT (deep venous thrombosis) before 2005    occurred after trauma,  treated for a while with Coumadin.   Marland Kitchen Dysphagia due to recent stroke 07/2012    ok'd for purree/pudding thick liquids but G tube placed by IR  . OSA (obstructive sleep apnea)     refused CPAP per notes.     History  Substance Use Topics  . Smoking status: Never Smoker   . Smokeless tobacco: Never Used  . Alcohol Use: No    Family History  Problem Relation Age of Onset  . Acne Mother     died giving birth to 8th kid  . Dementia Father     died 39  . Acne Brother     died 2-3 yrs ago  . Cervical cancer Sister     Allergies  Allergen Reactions  . Hyoscyamine   . Ivp Dye (Iodinated Diagnostic Agents)   . Septra (Sulfamethoxazole W-Trimethoprim)     Objective: Temp:  [97.7 F (36.5 C)-98.7 F (37.1 C)] 98.6 F (37 C) (06/14 0900) Pulse Rate:  [86-93] 87 (06/14 0900) Resp:  [18-20] 20 (06/14 0900) BP: (116-130)/(61-72) 122/68 mmHg (06/14 0900) SpO2:  [100 %] 100 % (06/14 0900) Weight:  [76.1 kg (167 lb 12.3 oz)] 76.1  kg (167 lb 12.3 oz) (06/13 1951)   Lab Results Lab Results  Component Value Date   WBC 5.4 09/19/2012   HGB 12.1 09/20/2012   HCT 37.8 09/20/2012   MCV 82.3 09/19/2012   PLT 204 09/19/2012    Lab Results  Component Value Date   CREATININE 0.33* 09/20/2012   BUN 10 09/20/2012   NA 140 09/20/2012   K 4.4 09/20/2012   CL 104 09/20/2012   CO2 29 09/20/2012    Lab Results  Component Value Date   ALT 16 09/18/2012   AST 17 09/18/2012   ALKPHOS 71 09/18/2012   BILITOT 0.7 09/18/2012      Microbiology: Recent Results (from the past 240 hour(s))  CULTURE, BLOOD (ROUTINE X 2)     Status: None   Collection Time    09/16/12  1:45 PM      Result Value Range Status   Specimen Description BLOOD ARM LEFT   Final   Special Requests BOTTLES DRAWN AEROBIC AND ANAEROBIC 10CC   Final   Culture  Setup Time 09/16/2012 16:39   Final   Culture     Final   Value: STAPHYLOCOCCUS SPECIES (COAGULASE NEGATIVE)     Note: SUSCEPTIBILITIES PERFORMED ON PREVIOUS CULTURE  WITHIN THE LAST 5 DAYS.     Note: Gram Stain Report Called to,Read Back By and Verified With: MONICA L @ 1240 09/17/12 BY KRAWS   Report Status 09/20/2012 FINAL   Final  CULTURE, BLOOD (ROUTINE X 2)     Status: None   Collection Time    09/16/12  2:10 PM      Result Value Range Status   Specimen Description BLOOD ARM LEFT   Final   Special Requests BOTTLES DRAWN AEROBIC AND ANAEROBIC 10CC   Final   Culture  Setup Time 09/16/2012 16:39   Final   Culture     Final   Value: STAPHYLOCOCCUS SPECIES (COAGULASE NEGATIVE)     Note: RIFAMPIN AND GENTAMICIN SHOULD NOT BE USED AS SINGLE DRUGS FOR TREATMENT OF STAPH INFECTIONS.     Note: Gram Stain Report Called to,Read Back By and Verified With: DOROTHY [facility] ON 09/17/2012 AT 8:59P BY WILEJ   Report Status 09/20/2012 FINAL   Final   Organism ID, Bacteria STAPHYLOCOCCUS SPECIES (COAGULASE NEGATIVE)   Final  URINE CULTURE     Status: None   Collection Time    09/16/12  2:27 PM      Result Value Range Status   Specimen Description URINE, CATHETERIZED   Final   Special Requests NONE   Final   Culture  Setup Time 09/16/2012 14:47   Final   Colony Count >=100,000 COLONIES/ML   Final   Culture ENTEROCOCCUS SPECIES   Final   Report Status 09/18/2012 FINAL   Final   Organism ID, Bacteria ENTEROCOCCUS SPECIES   Final  MRSA PCR SCREENING     Status: None   Collection Time    09/16/12  5:13 PM      Result Value Range Status   MRSA by PCR NEGATIVE  NEGATIVE Final   Comment:            The GeneXpert MRSA Assay (FDA     approved for NASAL specimens     only), is one component of a     comprehensive MRSA colonization     surveillance program. It is not     intended to diagnose MRSA     infection nor to guide or  monitor treatment for     MRSA infections.  URINE CULTURE     Status: None   Collection Time    09/16/12  5:45 PM      Result Value Range Status   Specimen Description URINE, CATHETERIZED   Final   Special Requests NONE   Final    Culture  Setup Time 09/16/2012 21:21   Final   Colony Count NO GROWTH   Final   Culture NO GROWTH   Final   Report Status 09/18/2012 FINAL   Final  CULTURE, BLOOD (ROUTINE X 2)     Status: None   Collection Time    09/20/12  4:50 PM      Result Value Range Status   Specimen Description BLOOD LEFT ARM   Final   Special Requests BOTTLES DRAWN AEROBIC ONLY 10CC   Final   Culture  Setup Time 09/20/2012 22:28   Final   Culture     Final   Value:        BLOOD CULTURE RECEIVED NO GROWTH TO DATE CULTURE WILL BE HELD FOR 5 DAYS BEFORE ISSUING A FINAL NEGATIVE REPORT   Report Status PENDING   Incomplete  CULTURE, BLOOD (ROUTINE X 2)     Status: None   Collection Time    09/20/12  4:50 PM      Result Value Range Status   Specimen Description BLOOD LEFT HAND   Final   Special Requests BOTTLES DRAWN AEROBIC ONLY 4CC   Final   Culture  Setup Time 09/20/2012 22:27   Final   Culture     Final   Value:        BLOOD CULTURE RECEIVED NO GROWTH TO DATE CULTURE WILL BE HELD FOR 5 DAYS BEFORE ISSUING A FINAL NEGATIVE REPORT   Report Status PENDING   Incomplete   Assessment: Repeat blood cultures are negative to date. I will continue vancomycin now for coagulase-negative staph bacteremia and enterococcal UTI.  Plan: 1. Continue vancomycin  Cliffton Asters, MD [facility] [phone] pager   [phone] cell 09/21/2012, 12:32 PM

## 2012-09-21 NOTE — Progress Notes (Signed)
CSW spoke with patient's nieces- Saylah Ketner and [name]- notified of tentative bed offer from [facility]- pending bed availability. They were pleased.  MD:  Please advise regarding tentative d/c date.  They refuse return to [facility]. Bed search extended to [facility] as back up in case [facility] does not have a bed when patient is medically stable.  Message left for Janie- Admissions at [facility] that family wants SNF placement there.   CSW will need to re-evaluate on Monday 09/23/12. Lorri Frederick. [place]  [phone]

## 2012-09-21 NOTE — Progress Notes (Signed)
[facility] Progress Note  Jenny Brady AVW:[id number] DOB: Oct 03, 1925 DOA: 09/16/2012 PCP: [name], MD  Brief narrative: 79 year old female resident of [facility]. Sent to the ER because of rectal bleeding, fever and urinary retention. She was found to be hypotensive in the emergency department due to sepsis and rectal bleed. She was admitted to the ICU by critical care medicine. CT of the abdomen and pelvis did reveal fecal impaction and diffuse colonic diverticulosis as well as moderate bilateral renal pelvocaliectasis ureterectasis. Found to have enterococcal UTI and now growing 2 out of 2 blood cultures with coag negative staph, sensitive only to vancomycin and rifampin.     She received 2 units of packed red blood cells. She was on Coumadin prior to admission for suspected mural thrombus so she was also given 2 units of FFP to reverse her INR (3.14) in the setting of lower GI bleeding. Coumadin will not be resumed, as transthoracic echocardiogram with contrast shows no vegetation. See below. No further bleeding. Flexible sigmoidoscopy has been canceled/postponed, as there is no one available to give consent. Also, bleeding has stopped and Coumadin will not be resumed.  Assessment/Plan:  HYPOTENSION - acute blood loss and sepsis resolved  Acute respiratory failure with hypoxia Improved. No evidence of HCAP  Enterococcus UTI (lower urinary tract infection)  Pansensitive. Continue vancomycin  Coag negative staph bacteremia/sepsis (2/2 positive) Continue vanc. Repeat bc pending  Rectal bleed in the setting of coumadin and fecal impaction Resolved.  Discussed with Dr. Leone Payor. Unable to get consent for flex sig. Discussed with Dr. Shirlee Latch, who did the TEE in April. TEE showed LV thrombus VS. LOOSE TRABECULAE. He recommends TTE with contrast. If no LV thrombus, can d/c coumadin. May not require flex sig, even if consent obtained. Will not resume  Coumadin    Coronary artery disease -stable-   CKD (chronic kidney disease) stage 3, GFR 30-59 ml/min -renal function stable/improving with good UOP  H/O ischemic left MCA stroke -April 2014 Patient is bed and chair bound, post PEG tube, nonverbal.  Chronic systolic congestive heart failure, NYHA class 3 / RHF chronic -compensated -EF 15% Resume lasix  Acute blood loss anemia stable -post 2 ubits PRBC since admit  Moderate pulmonary HTN/Moderate TR -see above re: RHF  Hyperthyroidism-s/p RAI admin On synthroid  Vascular Dementia Lacks capacity for consent  DVT prophylaxis: SCDs Code Status: Full Family Communication: none today  Son has power of attorney but other family members are applying for guardianship due to son not being reliable as advocate for patient (apparent alcoholic) - patient's family is working with lawyer [name] [phone] to obtain guardianship - all medical questions intended for family as well as any need for consent for procedures or treatments need to be directed to [name]  Per SW notes: Per [name], several attempts have been made to get in contact with pt's son, [name], to no avail; Well checks via [employer], Letters mailed to his home, and calls made to son's phone number.   Per chart review and discussion with Chiropodist, CSW department had difficulty getting in touch with pt's son and pt's niece, Alek Poncedeleon Contongiannis became contact person for pt; assisting with pt's placement at [facility]   [facility] filed for a motion to determine pt's competency and, if decided pt is incompetent, to have a person identified to assist with pt's decision making.  CSW attempted to phone pt's son-voicemail message was in [place]. CSW left  voicemail message in both Spanish and [place] requesting a return call. Zuleyka Kloc provided son's home number [phone]). When CSW attempted to phone this number, a message stated this number  is not accepting phone calls.   CSW spoke with [name] who stated she is currently out of town and requested [name] be added to list to allow Marisue Ivan to receive updates as well.   Disposition Plan: Family now requesting [facility] skilled nursing facility  Consultants: Gastroenterology ID  Procedures: None  Antibiotics: Zosyn 6/9 >>>6/11 Vancomycin 6/9 >>>  HPI/Subjective: Unable. Per nursing staff, no significant skin breakdown. He had a soft nonbloody stool today. No problems reported.  Objective: Blood pressure 100/64, pulse 79, temperature 98 F (36.7 C), temperature source Oral, resp. rate 20, height 5' 4.96" (1.65 m), weight 76.1 kg (167 lb 12.3 oz), SpO2 100.00%.  Intake/Output Summary (Last 24 hours) at 09/21/12 1421 Last data filed at 09/21/12 0443  Gross per 24 hour  Intake      0 ml  Output   1150 ml  Net  -1150 ml    Exam: Gen:  Alert. Nonverbal. Nods "yes" to all questions I ask. Does not follow commands HEENT: disconjugate gaze, does appear to be tracking Lungs: Clear to auscultation bilaterally without wheezes or crackles, RA Cardiovascular: Regular rate and rhythm without murmur gallop or rub normal S1 and S2, no peripheral edema or JVD Abdomen: normal BS. S, NT, ND Ext: No significant cyanosis, clubbing of bilateral lower extremities  Scheduled Meds: Scheduled Meds: . cyanocobalamin  500 mcg Per Tube Daily  . docusate  100 mg Per Tube BID  . insulin aspart  0-9 Units Subcutaneous Q4H  . levothyroxine  75 mcg Per Tube QAC breakfast  . memantine  5 mg Per Tube BID  . pantoprazole sodium  40 mg Per Tube QHS  . polyethylene glycol  17 g Per Tube Daily  . protein supplement  1 scoop Oral Daily  . simvastatin  20 mg Per Tube q1800  . sodium phosphate  1 enema Rectal Once  . vancomycin  1,250 mg Intravenous Q12H    Data Reviewed: Basic Metabolic Panel:  Recent Labs Lab 09/16/12 1338 09/16/12 1356 09/17/12 0545 09/18/12 0732  09/19/12 0505 09/20/12 1650  NA 140 142 149* 147* 144 140  K 4.6 4.7 3.0* 2.8* 3.7 4.4  CL 100 104 112 110 111 104  CO2 27  --  30 28 25 29   GLUCOSE 268* 280* 137* 93 88 136*  BUN 56* 69* 31* 20 17 10   CREATININE 0.89 1.20* 0.50 0.46* 0.37* 0.33*  CALCIUM 9.7  --  8.5 8.4 8.7 8.5  MG  --   --  2.0  --  1.8  --   PHOS  --   --  3.7  --   --   --    Liver Function Tests:  Recent Labs Lab 09/16/12 1338 09/18/12 0732  AST 28 17  ALT 21 16  ALKPHOS 86 71  BILITOT 0.5 0.7  PROT 7.2 5.9*  ALBUMIN 2.3* 2.0*    Recent Labs Lab 09/16/12 1338  LIPASE 32   CBC:  Recent Labs Lab 09/16/12 1338  09/17/12 0545 09/17/12 1600 09/17/12 2314 09/18/12 0732 09/19/12 0505 09/20/12 1650  WBC 11.9*  < > 8.8 9.0 9.0 7.5 5.4  --   NEUTROABS 10.0*  --   --   --   --   --   --   --   HGB 12.9  < >  9.7* 10.9* 10.6* 10.5* 11.1* 12.1  HCT 39.9  < > 30.0* 34.1* 33.0* 33.1* 34.4* 37.8  MCV 80.1  < > 82.0 83.0 82.3 82.5 82.3  --   PLT 259  < > 140* 178 164 180 204  --   < > = values in this interval not displayed. Cardiac Enzymes:  Recent Labs Lab 09/17/12 0117  TROPONINI <0.30   BNP (last 3 results)  Recent Labs  07/21/12 1635  PROBNP 9584.0*   CBG:  Recent Labs Lab 09/20/12 1950 09/21/12 0011 09/21/12 0409 09/21/12 0744 09/21/12 1136  GLUCAP 146* 145* 157* 138* 154*    Recent Results (from the past 240 hour(s))  CULTURE, BLOOD (ROUTINE X 2)     Status: None   Collection Time    09/16/12  1:45 PM      Result Value Range Status   Specimen Description BLOOD ARM LEFT   Final   Special Requests BOTTLES DRAWN AEROBIC AND ANAEROBIC 10CC   Final   Culture  Setup Time 09/16/2012 16:39   Final   Culture     Final   Value: STAPHYLOCOCCUS SPECIES (COAGULASE NEGATIVE)     Note: SUSCEPTIBILITIES PERFORMED ON PREVIOUS CULTURE WITHIN THE LAST 5 DAYS.     Note: Gram Stain Report Called to,Read Back By and Verified With: MONICA L @ 1240 09/17/12 BY KRAWS   Report Status  09/20/2012 FINAL   Final  CULTURE, BLOOD (ROUTINE X 2)     Status: None   Collection Time    09/16/12  2:10 PM      Result Value Range Status   Specimen Description BLOOD ARM LEFT   Final   Special Requests BOTTLES DRAWN AEROBIC AND ANAEROBIC 10CC   Final   Culture  Setup Time 09/16/2012 16:39   Final   Culture     Final   Value: STAPHYLOCOCCUS SPECIES (COAGULASE NEGATIVE)     Note: RIFAMPIN AND GENTAMICIN SHOULD NOT BE USED AS SINGLE DRUGS FOR TREATMENT OF STAPH INFECTIONS.     Note: Gram Stain Report Called to,Read Back By and Verified With: DOROTHY [facility] ON 09/17/2012 AT 8:59P BY WILEJ   Report Status 09/20/2012 FINAL   Final   Organism ID, Bacteria STAPHYLOCOCCUS SPECIES (COAGULASE NEGATIVE)   Final  URINE CULTURE     Status: None   Collection Time    09/16/12  2:27 PM      Result Value Range Status   Specimen Description URINE, CATHETERIZED   Final   Special Requests NONE   Final   Culture  Setup Time 09/16/2012 14:47   Final   Colony Count >=100,000 COLONIES/ML   Final   Culture ENTEROCOCCUS SPECIES   Final   Report Status 09/18/2012 FINAL   Final   Organism ID, Bacteria ENTEROCOCCUS SPECIES   Final  MRSA PCR SCREENING     Status: None   Collection Time    09/16/12  5:13 PM      Result Value Range Status   MRSA by PCR NEGATIVE  NEGATIVE Final   Comment:            The GeneXpert MRSA Assay (FDA     approved for NASAL specimens     only), is one component of a     comprehensive MRSA colonization     surveillance program. It is not     intended to diagnose MRSA     infection nor to guide or     monitor treatment for  MRSA infections.  URINE CULTURE     Status: None   Collection Time    09/16/12  5:45 PM      Result Value Range Status   Specimen Description URINE, CATHETERIZED   Final   Special Requests NONE   Final   Culture  Setup Time 09/16/2012 21:21   Final   Colony Count NO GROWTH   Final   Culture NO GROWTH   Final   Report Status 09/18/2012 FINAL   Final   CULTURE, BLOOD (ROUTINE X 2)     Status: None   Collection Time    09/20/12  4:50 PM      Result Value Range Status   Specimen Description BLOOD LEFT ARM   Final   Special Requests BOTTLES DRAWN AEROBIC ONLY 10CC   Final   Culture  Setup Time 09/20/2012 22:28   Final   Culture     Final   Value:        BLOOD CULTURE RECEIVED NO GROWTH TO DATE CULTURE WILL BE HELD FOR 5 DAYS BEFORE ISSUING A FINAL NEGATIVE REPORT   Report Status PENDING   Incomplete  CULTURE, BLOOD (ROUTINE X 2)     Status: None   Collection Time    09/20/12  4:50 PM      Result Value Range Status   Specimen Description BLOOD LEFT HAND   Final   Special Requests BOTTLES DRAWN AEROBIC ONLY 4CC   Final   Culture  Setup Time 09/20/2012 22:27   Final   Culture     Final   Value:        BLOOD CULTURE RECEIVED NO GROWTH TO DATE CULTURE WILL BE HELD FOR 5 DAYS BEFORE ISSUING A FINAL NEGATIVE REPORT   Report Status PENDING   Incomplete   Crista Curb, M.D. [phone]  If 7PM-7AM, please contact night-coverage [email or web address] Password [email or web address] 09/21/2012, 2:21 PM   LOS: 5 days

## 2012-09-22 LAB — GLUCOSE, CAPILLARY
Glucose-Capillary: 157 mg/dL — ABNORMAL HIGH (ref 70–99)
Glucose-Capillary: 147 mg/dL — ABNORMAL HIGH (ref 70–99)
Glucose-Capillary: 148 mg/dL — ABNORMAL HIGH (ref 70–99)
Glucose-Capillary: 157 mg/dL — ABNORMAL HIGH (ref 70–99)

## 2012-09-22 MED ORDER — FUROSEMIDE 8 MG/ML PO SOLN
40.0000 mg | Freq: Every day | ORAL | Status: DC
  Administered 2012-09-22 – 2012-09-25 (×4): 40 mg
  Filled 2012-09-22 (×5): qty 5

## 2012-09-22 NOTE — Progress Notes (Signed)
Pt's right hip is reddened, but blanchable. Will continue to reposition patient. C.Kendy Haston, RN.

## 2012-09-22 NOTE — Progress Notes (Signed)
[facility] Progress Note  ELEESHA PURKEY WRU:[id number] DOB: 1926/02/25 DOA: 09/16/2012 PCP: [name], MD  Brief narrative: 79 year old female resident of [facility]. Sent to the ER because of rectal bleeding, fever and urinary retention. She was found to be hypotensive in the emergency department due to sepsis and rectal bleed. She was admitted to the ICU by critical care medicine. CT of the abdomen and pelvis did reveal fecal impaction and diffuse colonic diverticulosis as well as moderate bilateral renal pelvocaliectasis ureterectasis. Found to have enterococcal UTI and now growing 2 out of 2 blood cultures with coag negative staph, sensitive only to vancomycin and rifampin.     She received 2 units of packed red blood cells. She was on Coumadin prior to admission for suspected mural thrombus so she was also given 2 units of FFP to reverse her INR (3.14) in the setting of lower GI bleeding. Coumadin will not be resumed, as transthoracic echocardiogram with contrast shows no vegetation. See below. No further bleeding. Flexible sigmoidoscopy has been canceled/postponed, as there is no one available to give consent. Also, bleeding has stopped and Coumadin will not be resumed.  Assessment/Plan:  HYPOTENSION - acute blood loss and sepsis resolved  Acute respiratory failure with hypoxia Improved. No evidence of HCAP  Enterococcus UTI (lower urinary tract infection)  Pansensitive. Continue vancomycin  Coag negative staph bacteremia/sepsis (2/2 positive) Continue vanc. Repeat bc pending  Rectal bleed in the setting of coumadin and fecal impaction Resolved.  Discussed with Dr. Leone Payor. Unable to get consent for flex sig. Discussed with Dr. Shirlee Latch, who did the TEE in April. TEE showed LV thrombus VS. LOOSE TRABECULAE. He recommends TTE with contrast. If no LV thrombus, can d/c coumadin. May not require flex sig, even if consent obtained. Will not resume  Coumadin    Coronary artery disease -stable-   CKD (chronic kidney disease) stage 3, GFR 30-59 ml/min -renal function stable/improving with good UOP  H/O ischemic left MCA stroke -April 2014 Patient is bed and chair bound, post PEG tube, nonverbal.  Chronic systolic congestive heart failure, NYHA class 3 / RHF chronic -compensated -EF 15% Resume lasix  Acute blood loss anemia stable -post 2 ubits PRBC since admit  Moderate pulmonary HTN/Moderate TR -see above re: RHF  Hyperthyroidism-s/p RAI admin On synthroid  Vascular Dementia Lacks capacity for consent  DVT prophylaxis: SCDs Code Status: Full Family Communication: none today  Son has power of attorney but other family members are applying for guardianship due to son not being reliable as advocate for patient (apparent alcoholic) - patient's family is working with lawyer [name] [phone] to obtain guardianship - all medical questions intended for family as well as any need for consent for procedures or treatments need to be directed to [name]  Per SW notes: Per [name], several attempts have been made to get in contact with pt's son, [name], to no avail; Well checks via [employer], Letters mailed to his home, and calls made to son's phone number.   Per chart review and discussion with Chiropodist, CSW department had difficulty getting in touch with pt's son and pt's niece, Jessly Lebeck Contongiannis became contact person for pt; assisting with pt's placement at [facility].   [facility] filed for a motion to determine pt's competency and, if decided pt is incompetent, to have a person identified to assist with pt's decision making.  CSW attempted to phone pt's son-voicemail message was in [place]. CSW left  voicemail message in both Spanish and [place] requesting a return call. Elener Custodio provided son's home number [phone]). When CSW attempted to phone this number, a message stated this number  is not accepting phone calls.   CSW spoke with [name] who stated she is currently out of town and requested [name] be added to list to allow Marisue Ivan to receive updates as well.   Disposition Plan: Family now requesting [facility] facility  Consultants: Gastroenterology ID  Procedures: None  Antibiotics: Zosyn 6/9 >>>6/11 Vancomycin 6/9 >>>  HPI/Subjective: Unable. Per nursing staff, no significant skin breakdown. He had a soft nonbloody stool today. No problems reported.  Objective: Blood pressure 112/55, pulse 89, temperature 98.6 F (37 C), temperature source Oral, resp. rate 20, height 5' 4.96" (1.65 m), weight 74.5 kg (164 lb 3.9 oz), SpO2 98.00%.  Intake/Output Summary (Last 24 hours) at 09/22/12 1053 Last data filed at 09/22/12 0900  Gross per 24 hour  Intake    350 ml  Output   2300 ml  Net  -1950 ml    Exam: Gen:  Alert. Nonverbal. Nods "yes" to all questions I ask. Does not follow commands HEENT: disconjugate gaze, does appear to be tracking Lungs: Clear to auscultation bilaterally without wheezes or crackles, RA Cardiovascular: Regular rate and rhythm without murmur gallop or rub normal S1 and S2, no peripheral edema or JVD Abdomen: normal BS. S, NT, ND Ext: No significant cyanosis, clubbing of bilateral lower extremities  Scheduled Meds: Scheduled Meds: . cyanocobalamin  500 mcg Per Tube Daily  . docusate  100 mg Per Tube BID  . furosemide  40 mg Per Tube Daily  . insulin aspart  0-9 Units Subcutaneous Q4H  . levothyroxine  75 mcg Per Tube QAC breakfast  . memantine  5 mg Per Tube BID  . pantoprazole sodium  40 mg Per Tube QHS  . polyethylene glycol  17 g Per Tube Daily  . protein supplement  1 scoop Oral Daily  . simvastatin  20 mg Per Tube q1800  . sodium phosphate  1 enema Rectal Once  . vancomycin  1,250 mg Intravenous Q12H    Data Reviewed: Basic Metabolic Panel:  Recent Labs Lab 09/16/12 1338 09/16/12 1356  09/17/12 0545 09/18/12 0732 09/19/12 0505 09/20/12 1650  NA 140 142 149* 147* 144 140  K 4.6 4.7 3.0* 2.8* 3.7 4.4  CL 100 104 112 110 111 104  CO2 27  --  30 28 25 29   GLUCOSE 268* 280* 137* 93 88 136*  BUN 56* 69* 31* 20 17 10   CREATININE 0.89 1.20* 0.50 0.46* 0.37* 0.33*  CALCIUM 9.7  --  8.5 8.4 8.7 8.5  MG  --   --  2.0  --  1.8  --   PHOS  --   --  3.7  --   --   --    Liver Function Tests:  Recent Labs Lab 09/16/12 1338 09/18/12 0732  AST 28 17  ALT 21 16  ALKPHOS 86 71  BILITOT 0.5 0.7  PROT 7.2 5.9*  ALBUMIN 2.3* 2.0*    Recent Labs Lab 09/16/12 1338  LIPASE 32   CBC:  Recent Labs Lab 09/16/12 1338  09/17/12 0545 09/17/12 1600 09/17/12 2314 09/18/12 0732 09/19/12 0505 09/20/12 1650  WBC 11.9*  < > 8.8 9.0 9.0 7.5 5.4  --   NEUTROABS 10.0*  --   --   --   --   --   --   --  HGB 12.9  < > 9.7* 10.9* 10.6* 10.5* 11.1* 12.1  HCT 39.9  < > 30.0* 34.1* 33.0* 33.1* 34.4* 37.8  MCV 80.1  < > 82.0 83.0 82.3 82.5 82.3  --   PLT 259  < > 140* 178 164 180 204  --   < > = values in this interval not displayed. Cardiac Enzymes:  Recent Labs Lab 09/17/12 0117  TROPONINI <0.30   BNP (last 3 results)  Recent Labs  07/21/12 1635  PROBNP 9584.0*   CBG:  Recent Labs Lab 09/21/12 1621 09/21/12 2003 09/22/12 0006 09/22/12 0403 09/22/12 0737  GLUCAP 115* 185* 175* 157* 147*    Recent Results (from the past 240 hour(s))  CULTURE, BLOOD (ROUTINE X 2)     Status: None   Collection Time    09/16/12  1:45 PM      Result Value Range Status   Specimen Description BLOOD ARM LEFT   Final   Special Requests BOTTLES DRAWN AEROBIC AND ANAEROBIC 10CC   Final   Culture  Setup Time 09/16/2012 16:39   Final   Culture     Final   Value: STAPHYLOCOCCUS SPECIES (COAGULASE NEGATIVE)     Note: SUSCEPTIBILITIES PERFORMED ON PREVIOUS CULTURE WITHIN THE LAST 5 DAYS.     Note: Gram Stain Report Called to,Read Back By and Verified With: MONICA L @ 1240 09/17/12 BY  KRAWS   Report Status 09/20/2012 FINAL   Final  CULTURE, BLOOD (ROUTINE X 2)     Status: None   Collection Time    09/16/12  2:10 PM      Result Value Range Status   Specimen Description BLOOD ARM LEFT   Final   Special Requests BOTTLES DRAWN AEROBIC AND ANAEROBIC 10CC   Final   Culture  Setup Time 09/16/2012 16:39   Final   Culture     Final   Value: STAPHYLOCOCCUS SPECIES (COAGULASE NEGATIVE)     Note: RIFAMPIN AND GENTAMICIN SHOULD NOT BE USED AS SINGLE DRUGS FOR TREATMENT OF STAPH INFECTIONS.     Note: Gram Stain Report Called to,Read Back By and Verified With: DOROTHY [facility] ON 09/17/2012 AT 8:59P BY WILEJ   Report Status 09/20/2012 FINAL   Final   Organism ID, Bacteria STAPHYLOCOCCUS SPECIES (COAGULASE NEGATIVE)   Final  URINE CULTURE     Status: None   Collection Time    09/16/12  2:27 PM      Result Value Range Status   Specimen Description URINE, CATHETERIZED   Final   Special Requests NONE   Final   Culture  Setup Time 09/16/2012 14:47   Final   Colony Count >=100,000 COLONIES/ML   Final   Culture ENTEROCOCCUS SPECIES   Final   Report Status 09/18/2012 FINAL   Final   Organism ID, Bacteria ENTEROCOCCUS SPECIES   Final  MRSA PCR SCREENING     Status: None   Collection Time    09/16/12  5:13 PM      Result Value Range Status   MRSA by PCR NEGATIVE  NEGATIVE Final   Comment:            The GeneXpert MRSA Assay (FDA     approved for NASAL specimens     only), is one component of a     comprehensive MRSA colonization     surveillance program. It is not     intended to diagnose MRSA     infection nor to guide or  monitor treatment for     MRSA infections.  URINE CULTURE     Status: None   Collection Time    09/16/12  5:45 PM      Result Value Range Status   Specimen Description URINE, CATHETERIZED   Final   Special Requests NONE   Final   Culture  Setup Time 09/16/2012 21:21   Final   Colony Count NO GROWTH   Final   Culture NO GROWTH   Final   Report Status  09/18/2012 FINAL   Final  CULTURE, BLOOD (ROUTINE X 2)     Status: None   Collection Time    09/20/12  4:50 PM      Result Value Range Status   Specimen Description BLOOD LEFT ARM   Final   Special Requests BOTTLES DRAWN AEROBIC ONLY 10CC   Final   Culture  Setup Time 09/20/2012 22:28   Final   Culture     Final   Value:        BLOOD CULTURE RECEIVED NO GROWTH TO DATE CULTURE WILL BE HELD FOR 5 DAYS BEFORE ISSUING A FINAL NEGATIVE REPORT   Report Status PENDING   Incomplete  CULTURE, BLOOD (ROUTINE X 2)     Status: None   Collection Time    09/20/12  4:50 PM      Result Value Range Status   Specimen Description BLOOD LEFT HAND   Final   Special Requests BOTTLES DRAWN AEROBIC ONLY 4CC   Final   Culture  Setup Time 09/20/2012 22:27   Final   Culture     Final   Value:        BLOOD CULTURE RECEIVED NO GROWTH TO DATE CULTURE WILL BE HELD FOR 5 DAYS BEFORE ISSUING A FINAL NEGATIVE REPORT   Report Status PENDING   Incomplete   Crista Curb, M.D. [phone]  If 7PM-7AM, please contact night-coverage [email or web address] Password [facility] 09/22/2012, 10:53 AM   LOS: 6 days

## 2012-09-23 LAB — GLUCOSE, CAPILLARY
Glucose-Capillary: 162 mg/dL — ABNORMAL HIGH (ref 70–99)
Glucose-Capillary: 136 mg/dL — ABNORMAL HIGH (ref 70–99)
Glucose-Capillary: 154 mg/dL — ABNORMAL HIGH (ref 70–99)
Glucose-Capillary: 138 mg/dL — ABNORMAL HIGH (ref 70–99)

## 2012-09-23 LAB — CBC
MCH: 26.8 pg (ref 26.0–34.0)
Platelets: ADEQUATE 10*3/uL (ref 150–400)
RBC: 4.33 MIL/uL (ref 3.87–5.11)
WBC: 7.2 10*3/uL (ref 4.0–10.5)

## 2012-09-23 LAB — BASIC METABOLIC PANEL
Calcium: 8.7 mg/dL (ref 8.4–10.5)
GFR calc non Af Amer: >90 mL/min (ref >90)
Glucose, Bld: 161 mg/dL — ABNORMAL HIGH (ref 70–99)
Sodium: 136 mEq/L (ref 135–145)

## 2012-09-23 NOTE — Progress Notes (Signed)
[facility] Progress Note  Jenny Brady AVW:[id number] DOB: October 01, 1925 DOA: 09/16/2012 PCP: [name], MD  Brief narrative: 79 year old female resident of [facility]. Sent to the ER because of rectal bleeding, fever and urinary retention. She was found to be hypotensive in the emergency department due to sepsis and rectal bleed. She was admitted to the ICU by critical care medicine. CT of the abdomen and pelvis did reveal fecal impaction and diffuse colonic diverticulosis as well as moderate bilateral renal pelvocaliectasis ureterectasis. Found to have enterococcal UTI and now growing 2 out of 2 blood cultures with coag negative staph, sensitive only to vancomycin and rifampin.     She received 2 units of packed red blood cells. She was on Coumadin prior to admission for suspected mural thrombus so she was also given 2 units of FFP to reverse her INR (3.14) in the setting of lower GI bleeding. Coumadin will not be resumed, as transthoracic echocardiogram with contrast shows no vegetation. See below. No further bleeding. Flexible sigmoidoscopy has been canceled/postponed, as there is no one available to give consent. Also, bleeding has stopped and Coumadin will not be resumed.  Assessment/Plan:  HYPOTENSION - acute blood loss and sepsis resolved  Acute respiratory failure with hypoxia Improved. No evidence of HCAP  Enterococcus UTI (lower urinary tract infection)  Pansensitive. Continue vancomycin  Coag negative staph bacteremia/sepsis (2/2 positive) Continue vanc. Repeat bc pending. Await recs form ID  Rectal bleed in the setting of coumadin and fecal impaction Resolved.  Discussed with Dr. Leone Payor. Unable to get consent for flex sig. Discussed with Dr. Shirlee Latch, who did the TEE in April. TEE showed LV thrombus VS. LOOSE TRABECULAE. He recommends TTE with contrast. If no LV thrombus, can d/c coumadin. May not require flex sig, even if consent obtained.  Will not resume Coumadin    Coronary artery disease -stable-   CKD (chronic kidney disease) stage 3, GFR 30-59 ml/min -renal function stable/improving with good UOP  H/O ischemic left MCA stroke -April 2014 Patient is bed and chair bound, post PEG tube, nonverbal.  Chronic systolic congestive heart failure, NYHA class 3 / RHF chronic -compensated -EF 15%  Acute blood loss anemia stable -post 2 ubits PRBC since admit  Moderate pulmonary HTN/Moderate TR -see above re: RHF  Hyperthyroidism-s/p RAI admin On synthroid  Vascular Dementia Lacks capacity for consent  DVT prophylaxis: SCDs Code Status: Full Family Communication: none today  Son has power of attorney but other family members are applying for guardianship due to son not being reliable as advocate for patient (apparent alcoholic) - patient's family is working with lawyer [name] [phone] to obtain guardianship - all medical questions intended for family as well as any need for consent for procedures or treatments need to be directed to [name]  Per SW notes: Per [name], several attempts have been made to get in contact with pt's son, [name], to no avail; Well checks via [employer], Letters mailed to his home, and calls made to son's phone number.   Per chart review and discussion with Chiropodist, CSW department had difficulty getting in touch with pt's son and pt's niece, Jeananne Bedwell Contongiannis became contact person for pt; assisting with pt's placement at [facility].   [facility] filed for a motion to determine pt's competency and, if decided pt is incompetent, to have a person identified to assist with pt's decision making.  CSW attempted to phone pt's son-voicemail message was in [place].  CSW left voicemail message in both [place] and [place] requesting a return call. Fusako Tanabe provided son's home number [phone]). When CSW attempted to phone this number, a message stated this  number is not accepting phone calls.   CSW spoke with [name] who stated she is currently out of town and requested [name] be added to list to allow Marisue Ivan to receive updates as well.   Disposition Plan: Family now requesting [facility]  Consultants: Gastroenterology ID  Procedures: None  Antibiotics: Zosyn 6/9 >>>6/11 Vancomycin 6/9 >>>  HPI/Subjective: Unable.   Objective: Blood pressure 104/60, pulse 92, temperature 98.9 F (37.2 C), temperature source Oral, resp. rate 16, height 5' 4.96" (1.65 m), weight 73.2 kg (161 lb 6 oz), SpO2 96.00%.  Intake/Output Summary (Last 24 hours) at 09/23/12 1645 Last data filed at 09/23/12 1354  Gross per 24 hour  Intake    350 ml  Output   1725 ml  Net  -1375 ml    Exam: Gen:  Alert. Nonverbal.  Lungs: Clear to auscultation bilaterally without wheezes or crackles, RA Cardiovascular: Regular rate and rhythm without murmur gallop or rub normal S1 and S2, no peripheral edema or JVD Abdomen: normal BS. S, NT, ND Ext: No significant cyanosis, clubbing of bilateral lower extremities  Scheduled Meds: Scheduled Meds: . cyanocobalamin  500 mcg Per Tube Daily  . docusate  100 mg Per Tube BID  . furosemide  40 mg Per Tube Daily  . insulin aspart  0-9 Units Subcutaneous Q4H  . levothyroxine  75 mcg Per Tube QAC breakfast  . memantine  5 mg Per Tube BID  . pantoprazole sodium  40 mg Per Tube QHS  . polyethylene glycol  17 g Per Tube Daily  . protein supplement  1 scoop Oral Daily  . simvastatin  20 mg Per Tube q1800  . sodium phosphate  1 enema Rectal Once  . vancomycin  1,250 mg Intravenous Q12H    Data Reviewed: Basic Metabolic Panel:  Recent Labs Lab 09/17/12 0545 09/18/12 0732 09/19/12 0505 09/20/12 1650 09/23/12 0621  NA 149* 147* 144 140 136  K 3.0* 2.8* 3.7 4.4 5.0  CL 112 110 111 104 99  CO2 30 28 25 29 26   GLUCOSE 137* 93 88 136* 161*  BUN 31* 20 17 10 17   CREATININE 0.50 0.46*  0.37* 0.33* 0.39*  CALCIUM 8.5 8.4 8.7 8.5 8.7  MG 2.0  --  1.8  --   --   PHOS 3.7  --   --   --   --    Liver Function Tests:  Recent Labs Lab 09/18/12 0732  AST 17  ALT 16  ALKPHOS 71  BILITOT 0.7  PROT 5.9*  ALBUMIN 2.0*   No results found for this basename: LIPASE, AMYLASE,  in the last 168 hours CBC:  Recent Labs Lab 09/17/12 1600 09/17/12 2314 09/18/12 0732 09/19/12 0505 09/20/12 1650 09/23/12 0621  WBC 9.0 9.0 7.5 5.4  --  7.2  HGB 10.9* 10.6* 10.5* 11.1* 12.1 11.6*  HCT 34.1* 33.0* 33.1* 34.4* 37.8 35.1*  MCV 83.0 82.3 82.5 82.3  --  81.1  PLT 178 164 180 204  --  PLATELET CLUMPS NOTED ON SMEAR, COUNT APPEARS ADEQUATE   Cardiac Enzymes:  Recent Labs Lab 09/17/12 0117  TROPONINI <0.30   BNP (last 3 results)  Recent Labs  07/21/12 1635  PROBNP 9584.0*   CBG:  Recent Labs Lab 09/23/12 0005 09/23/12 0425 09/23/12 0749 09/23/12  1133 09/23/12 1626  GLUCAP 174* 162* 136* 146* 154*    Recent Results (from the past 240 hour(s))  CULTURE, BLOOD (ROUTINE X 2)     Status: None   Collection Time    09/16/12  1:45 PM      Result Value Range Status   Specimen Description BLOOD ARM LEFT   Final   Special Requests BOTTLES DRAWN AEROBIC AND ANAEROBIC 10CC   Final   Culture  Setup Time 09/16/2012 16:39   Final   Culture     Final   Value: STAPHYLOCOCCUS SPECIES (COAGULASE NEGATIVE)     Note: SUSCEPTIBILITIES PERFORMED ON PREVIOUS CULTURE WITHIN THE LAST 5 DAYS.     Note: Gram Stain Report Called to,Read Back By and Verified With: MONICA L @ 1240 09/17/12 BY KRAWS   Report Status 09/20/2012 FINAL   Final  CULTURE, BLOOD (ROUTINE X 2)     Status: None   Collection Time    09/16/12  2:10 PM      Result Value Range Status   Specimen Description BLOOD ARM LEFT   Final   Special Requests BOTTLES DRAWN AEROBIC AND ANAEROBIC 10CC   Final   Culture  Setup Time 09/16/2012 16:39   Final   Culture     Final   Value: STAPHYLOCOCCUS SPECIES (COAGULASE NEGATIVE)      Note: RIFAMPIN AND GENTAMICIN SHOULD NOT BE USED AS SINGLE DRUGS FOR TREATMENT OF STAPH INFECTIONS.     Note: Gram Stain Report Called to,Read Back By and Verified With: DOROTHY [facility] ON 09/17/2012 AT 8:59P BY WILEJ   Report Status 09/20/2012 FINAL   Final   Organism ID, Bacteria STAPHYLOCOCCUS SPECIES (COAGULASE NEGATIVE)   Final  URINE CULTURE     Status: None   Collection Time    09/16/12  2:27 PM      Result Value Range Status   Specimen Description URINE, CATHETERIZED   Final   Special Requests NONE   Final   Culture  Setup Time 09/16/2012 14:47   Final   Colony Count >=100,000 COLONIES/ML   Final   Culture ENTEROCOCCUS SPECIES   Final   Report Status 09/18/2012 FINAL   Final   Organism ID, Bacteria ENTEROCOCCUS SPECIES   Final  MRSA PCR SCREENING     Status: None   Collection Time    09/16/12  5:13 PM      Result Value Range Status   MRSA by PCR NEGATIVE  NEGATIVE Final   Comment:            The GeneXpert MRSA Assay (FDA     approved for NASAL specimens     only), is one component of a     comprehensive MRSA colonization     surveillance program. It is not     intended to diagnose MRSA     infection nor to guide or     monitor treatment for     MRSA infections.  URINE CULTURE     Status: None   Collection Time    09/16/12  5:45 PM      Result Value Range Status   Specimen Description URINE, CATHETERIZED   Final   Special Requests NONE   Final   Culture  Setup Time 09/16/2012 21:21   Final   Colony Count NO GROWTH   Final   Culture NO GROWTH   Final   Report Status 09/18/2012 FINAL   Final  CULTURE, BLOOD (ROUTINE X 2)  Status: None   Collection Time    09/20/12  4:50 PM      Result Value Range Status   Specimen Description BLOOD LEFT ARM   Final   Special Requests BOTTLES DRAWN AEROBIC ONLY 10CC   Final   Culture  Setup Time 09/20/2012 22:28   Final   Culture     Final   Value:        BLOOD CULTURE RECEIVED NO GROWTH TO DATE CULTURE WILL BE HELD FOR 5  DAYS BEFORE ISSUING A FINAL NEGATIVE REPORT   Report Status PENDING   Incomplete  CULTURE, BLOOD (ROUTINE X 2)     Status: None   Collection Time    09/20/12  4:50 PM      Result Value Range Status   Specimen Description BLOOD LEFT HAND   Final   Special Requests BOTTLES DRAWN AEROBIC ONLY 4CC   Final   Culture  Setup Time 09/20/2012 22:27   Final   Culture     Final   Value:        BLOOD CULTURE RECEIVED NO GROWTH TO DATE CULTURE WILL BE HELD FOR 5 DAYS BEFORE ISSUING A FINAL NEGATIVE REPORT   Report Status PENDING   Incomplete   Crista Curb, M.D. [phone]  If 7PM-7AM, please contact night-coverage [email or web address] Password [email or web address] 09/23/2012, 4:45 PM   LOS: 7 days

## 2012-09-23 NOTE — Progress Notes (Signed)
ANTIBIOTIC CONSULT NOTE - FOLLOW UP  Pharmacy Consult for Vancomycin Indication: CoNS bacteremia; Enterococcal UTI  Allergies  Allergen Reactions  . Hyoscyamine   . Ivp Dye (Iodinated Diagnostic Agents)   . Septra (Sulfamethoxazole W-Trimethoprim)   Patient Measurements: Height: 5' 4.96" (165 cm) Weight: 161 lb 6 oz (73.2 kg) IBW/kg (Calculated) : 56.91 Vital Signs: Temp: 98.6 F (37 C) (06/16 0920) Temp src: Oral (06/16 0920) BP: 109/57 mmHg (06/16 0920) Pulse Rate: 98 (06/16 0920) Intake/Output from previous day: 06/15 0701 - 06/16 0700 In: 350 [IV Piggyback:250] Out: 2025 [Urine:2025] Labs:  Recent Labs  09/20/12 1650 09/23/12 0621  WBC  --  7.2  HGB 12.1 11.6*  PLT  --  PLATELET CLUMPS NOTED ON SMEAR, COUNT APPEARS ADEQUATE  CREATININE 0.33* 0.39*   Estimated Creatinine Clearance: 52.4 ml/min (by C-G formula based on Cr of 0.39).  Recent Labs  09/20/12 1921 09/23/12 1040  VANCOTROUGH 9.0* 19.7     Microbiology: Recent Results (from the past 720 hour(s))  CULTURE, BLOOD (ROUTINE X 2)     Status: None   Collection Time    09/16/12  1:45 PM      Result Value Range Status   Specimen Description BLOOD ARM LEFT   Final   Special Requests BOTTLES DRAWN AEROBIC AND ANAEROBIC 10CC   Final   Culture  Setup Time 09/16/2012 16:39   Final   Culture     Final   Value: STAPHYLOCOCCUS SPECIES (COAGULASE NEGATIVE)     Note: SUSCEPTIBILITIES PERFORMED ON PREVIOUS CULTURE WITHIN THE LAST 5 DAYS.     Note: Gram Stain Report Called to,Read Back By and Verified With: MONICA L @ 1240 09/17/12 BY KRAWS   Report Status 09/20/2012 FINAL   Final  CULTURE, BLOOD (ROUTINE X 2)     Status: None   Collection Time    09/16/12  2:10 PM      Result Value Range Status   Specimen Description BLOOD ARM LEFT   Final   Special Requests BOTTLES DRAWN AEROBIC AND ANAEROBIC 10CC   Final   Culture  Setup Time 09/16/2012 16:39   Final   Culture     Final   Value: STAPHYLOCOCCUS SPECIES  (COAGULASE NEGATIVE)     Note: RIFAMPIN AND GENTAMICIN SHOULD NOT BE USED AS SINGLE DRUGS FOR TREATMENT OF STAPH INFECTIONS.     Note: Gram Stain Report Called to,Read Back By and Verified With: DOROTHY [employer] ON 09/17/2012 AT 8:59P BY WILEJ   Report Status 09/20/2012 FINAL   Final   Organism ID, Bacteria STAPHYLOCOCCUS SPECIES (COAGULASE NEGATIVE)   Final  URINE CULTURE     Status: None   Collection Time    09/16/12  2:27 PM      Result Value Range Status   Specimen Description URINE, CATHETERIZED   Final   Special Requests NONE   Final   Culture  Setup Time 09/16/2012 14:47   Final   Colony Count >=100,000 COLONIES/ML   Final   Culture ENTEROCOCCUS SPECIES   Final   Report Status 09/18/2012 FINAL   Final   Organism ID, Bacteria ENTEROCOCCUS SPECIES   Final  MRSA PCR SCREENING     Status: None   Collection Time    09/16/12  5:13 PM      Result Value Range Status   MRSA by PCR NEGATIVE  NEGATIVE Final   Comment:            The GeneXpert MRSA Assay (FDA  approved for NASAL specimens     only), is one component of a     comprehensive MRSA colonization     surveillance program. It is not     intended to diagnose MRSA     infection nor to guide or     monitor treatment for     MRSA infections.  URINE CULTURE     Status: None   Collection Time    09/16/12  5:45 PM      Result Value Range Status   Specimen Description URINE, CATHETERIZED   Final   Special Requests NONE   Final   Culture  Setup Time 09/16/2012 21:21   Final   Colony Count NO GROWTH   Final   Culture NO GROWTH   Final   Report Status 09/18/2012 FINAL   Final  CULTURE, BLOOD (ROUTINE X 2)     Status: None   Collection Time    09/20/12  4:50 PM      Result Value Range Status   Specimen Description BLOOD LEFT ARM   Final   Special Requests BOTTLES DRAWN AEROBIC ONLY 10CC   Final   Culture  Setup Time 09/20/2012 22:28   Final   Culture     Final   Value:        BLOOD CULTURE RECEIVED NO GROWTH TO DATE CULTURE  WILL BE HELD FOR 5 DAYS BEFORE ISSUING A FINAL NEGATIVE REPORT   Report Status PENDING   Incomplete  CULTURE, BLOOD (ROUTINE X 2)     Status: None   Collection Time    09/20/12  4:50 PM      Result Value Range Status   Specimen Description BLOOD LEFT HAND   Final   Special Requests BOTTLES DRAWN AEROBIC ONLY 4CC   Final   Culture  Setup Time 09/20/2012 22:27   Final   Culture     Final   Value:        BLOOD CULTURE RECEIVED NO GROWTH TO DATE CULTURE WILL BE HELD FOR 5 DAYS BEFORE ISSUING A FINAL NEGATIVE REPORT   Report Status PENDING   Incomplete    Anti-infectives   Start     Dose/Rate Route Frequency Ordered Stop   09/20/12 2120  vancomycin (VANCOCIN) 1,250 mg in sodium chloride 0.9 % 250 mL IVPB     1,250 mg 166.7 mL/hr over 90 Minutes Intravenous Every 12 hours 09/20/12 2121     09/19/12 2000  vancomycin (VANCOCIN) IVPB 750 mg/150 ml premix  Status:  Discontinued     750 mg 150 mL/hr over 60 Minutes Intravenous Every 12 hours 09/19/12 1414 09/20/12 2121   09/18/12 1100  vancomycin (VANCOCIN) IVPB 750 mg/150 ml premix  Status:  Discontinued     750 mg 150 mL/hr over 60 Minutes Intravenous Every 24 hours 09/18/12 1004 09/19/12 1414   09/17/12 1500  vancomycin (VANCOCIN) IVPB 1000 mg/200 mL premix  Status:  Discontinued     1,000 mg 200 mL/hr over 60 Minutes Intravenous Every 24 hours 09/16/12 1421 09/18/12 1004   09/16/12 2200  piperacillin-tazobactam (ZOSYN) IVPB 3.375 g  Status:  Discontinued     3.375 g 12.5 mL/hr over 240 Minutes Intravenous 3 times per day 09/16/12 1421 09/18/12 1514   09/16/12 1345  piperacillin-tazobactam (ZOSYN) IVPB 3.375 g     3.375 g 100 mL/hr over 30 Minutes Intravenous  Once 09/16/12 1339 09/16/12 1448   09/16/12 1345  vancomycin (VANCOCIN) IVPB 1000 mg/200 mL premix  1,000 mg 200 mL/hr over 60 Minutes Intravenous  Once 09/16/12 1339 09/16/12 1609      Assessment: 86 YOF with SIRS and fecal impaction found to have enterococcal UTI and  coagulase negative staph bacteremia with a past history of a mural thrombus in LV on TEE from April. Now on vancomycin day #8.   Vancomycin trough =19.7 today on 1250mg  IV q12h. This is a steady state level and it is therapeutic.   SCr 0.39/estimated CrCl~52 mL/min. Patient is currently afebrile and WBC today is within normal limits. Recorded UOP good (unsure how accurate).   6/9 vanc >> 6/9 zosyn >> 6/11  6/9 Urine >100K Enterococcus (pan sens) 6/9 BCx x 2 >> 2/2 GPC i>> Staph coag [-] > only SS Vanc/Rifam  Goal of Therapy:  Vancomycin trough level 15-20 mcg/ml  Plan:  1. Continue vancomycin 1250mg  IV q12h.   2. Monitor renal function, clinical status daily and  weekly vancomycin levels if continued.    Noah Delaine, RPh Clinical Pharmacist Pager: [phone]  09/23/2012,12:01 PM

## 2012-09-24 LAB — GLUCOSE, CAPILLARY
Glucose-Capillary: 157 mg/dL — ABNORMAL HIGH (ref 70–99)
Glucose-Capillary: 158 mg/dL — ABNORMAL HIGH (ref 70–99)
Glucose-Capillary: 160 mg/dL — ABNORMAL HIGH (ref 70–99)

## 2012-09-24 MED ORDER — SODIUM CHLORIDE 0.9 % IJ SOLN: 10.0000 mL | INTRAMUSCULAR | Status: DC | PRN

## 2012-09-24 NOTE — Progress Notes (Signed)
Peripherally Inserted Central Catheter/Midline Placement  The IV Nurse has discussed with the patient and/or persons authorized to consent for the patient, the purpose of this procedure and the potential benefits and risks involved with this procedure.  The benefits include less needle sticks, lab draws from the catheter and patient may be discharged home with the catheter.  Risks include, but not limited to, infection, bleeding, blood clot (thrombus formation), and puncture of an artery; nerve damage and irregular heat beat.  Alternatives to this procedure were also discussed.  PICC/Midline Placement Documentation     Telephone consent   Dorena Bodo 09/24/2012, 11:40 AM

## 2012-09-24 NOTE — Clinical Social Work Note (Signed)
CSW talked by phone with patient's niece [name] regarding patient's upcoming discharge. Niece advised that admissions paperwork needed to be completed and faxed back to [facility] today as patient may be ready for discharge Wednesday, 6/18. Niece indicated that she will contact admissions director at [facility]. She also had a question for the Md and Marisue Ivan advised that MD would call.  Genelle Bal, MSW, LCSW [phone]

## 2012-09-24 NOTE — Progress Notes (Signed)
Patient ID: Jenny Brady, female   DOB: 15-Jul-1925, 79 y.o.   MRN: [id number]         [facility]    Date of Admission:  09/16/2012           Day 8 vancomycin  Active Problems:   HYPOTENSION   Hyperthyroidism-s/p RAI admin   Coronary artery disease   GERD (gastroesophageal reflux disease)   CKD (chronic kidney disease) stage 3, GFR 30-59 ml/min   H/O ischemic left MCA stroke   Chronic systolic congestive heart failure, NYHA class 3   UTI (lower urinary tract infection)   Dementia with behavioral disturbance   Dysphagia, unspecified(787.20)   Long term (current) use of anticoagulants   Rectal bleed   Sepsis   HCAP (healthcare-associated pneumonia)   Acute respiratory failure with hypoxia   Mural thrombus of heart   Anemia of chronic disease   Fecal impaction   . cyanocobalamin  500 mcg Per Tube Daily  . docusate  100 mg Per Tube BID  . furosemide  40 mg Per Tube Daily  . insulin aspart  0-9 Units Subcutaneous Q4H  . levothyroxine  75 mcg Per Tube QAC breakfast  . memantine  5 mg Per Tube BID  . pantoprazole sodium  40 mg Per Tube QHS  . polyethylene glycol  17 g Per Tube Daily  . protein supplement  1 scoop Oral Daily  . simvastatin  20 mg Per Tube q1800  . sodium phosphate  1 enema Rectal Once  . vancomycin  1,250 mg Intravenous Q12H   Past Medical History  Diagnosis Date  . Hyperthyroidism     had RAI-is on thyroid replacement, happened maybe this summer  . Coronary artery disease ?, before 2005    Had a catheterization per son by Dr. [name] in 1991 no reports in  [place].  note from 2005 mentions hx of coronary stent  . GERD (gastroesophageal reflux disease)   . Stroke 07/2012    Right hemiplegia, dysphagia, aphasia.  . Dementia   . Aphasia 07/2012  . Chronic systolic heart failure     07/2012 the EF was 15%  . Mural thrombus of cardiac apex   . Obesity   . IBS (irritable bowel syndrome)   . DVT (deep venous thrombosis) before  2005    occurred after trauma, treated for a while with Coumadin.   Marland Kitchen Dysphagia due to recent stroke 07/2012    ok'd for purree/pudding thick liquids but G tube placed by IR  . OSA (obstructive sleep apnea)     refused CPAP per notes.     History  Substance Use Topics  . Smoking status: Never Smoker   . Smokeless tobacco: Never Used  . Alcohol Use: No    Family History  Problem Relation Age of Onset  . Acne Mother     died giving birth to 8th kid  . Dementia Father     died 66  . Acne Brother     died 2-3 yrs ago  . Cervical cancer Sister     Allergies  Allergen Reactions  . Hyoscyamine   . Ivp Dye (Iodinated Diagnostic Agents)   . Septra (Sulfamethoxazole W-Trimethoprim)     Objective: Temp:  [98.1 F (36.7 C)-99.2 F (37.3 C)] 98.7 F (37.1 C) (06/17 0405) Pulse Rate:  [84-92] 84 (06/17 0405) Resp:  [16-19] 19 (06/17 0405) BP: (96-119)/(45-86) 119/45 mmHg (06/17 0405) SpO2:  [92 %-98 %] 98 % (06/17  0405) Weight:  [163 lb 2.3 oz (74 kg)-176 lb 9.4 oz (80.1 kg)] 176 lb 9.4 oz (80.1 kg) (06/17 0445)   Lab Results Lab Results  Component Value Date   WBC 7.2 09/23/2012   HGB 11.6* 09/23/2012   HCT 35.1* 09/23/2012   MCV 81.1 09/23/2012   PLT PLATELET CLUMPS NOTED ON SMEAR, COUNT APPEARS ADEQUATE 09/23/2012    Lab Results  Component Value Date   CREATININE 0.39* 09/23/2012   BUN 17 09/23/2012   NA 136 09/23/2012   K 5.0 09/23/2012   CL 99 09/23/2012   CO2 26 09/23/2012    Lab Results  Component Value Date   ALT 16 09/18/2012   AST 17 09/18/2012   ALKPHOS 71 09/18/2012   BILITOT 0.7 09/18/2012      Microbiology: Recent Results (from the past 240 hour(s))  CULTURE, BLOOD (ROUTINE X 2)     Status: None   Collection Time    09/16/12  1:45 PM      Result Value Range Status   Specimen Description BLOOD ARM LEFT   Final   Special Requests BOTTLES DRAWN AEROBIC AND ANAEROBIC 10CC   Final   Culture  Setup Time 09/16/2012 16:39   Final   Culture     Final   Value:  STAPHYLOCOCCUS SPECIES (COAGULASE NEGATIVE)     Note: SUSCEPTIBILITIES PERFORMED ON PREVIOUS CULTURE WITHIN THE LAST 5 DAYS.     Note: Gram Stain Report Called to,Read Back By and Verified With: MONICA L @ 1240 09/17/12 BY KRAWS   Report Status 09/20/2012 FINAL   Final  CULTURE, BLOOD (ROUTINE X 2)     Status: None   Collection Time    09/16/12  2:10 PM      Result Value Range Status   Specimen Description BLOOD ARM LEFT   Final   Special Requests BOTTLES DRAWN AEROBIC AND ANAEROBIC 10CC   Final   Culture  Setup Time 09/16/2012 16:39   Final   Culture     Final   Value: STAPHYLOCOCCUS SPECIES (COAGULASE NEGATIVE)     Note: RIFAMPIN AND GENTAMICIN SHOULD NOT BE USED AS SINGLE DRUGS FOR TREATMENT OF STAPH INFECTIONS.     Note: Gram Stain Report Called to,Read Back By and Verified With: DOROTHY [facility] ON 09/17/2012 AT 8:59P BY WILEJ   Report Status 09/20/2012 FINAL   Final   Organism ID, Bacteria STAPHYLOCOCCUS SPECIES (COAGULASE NEGATIVE)   Final  URINE CULTURE     Status: None   Collection Time    09/16/12  2:27 PM      Result Value Range Status   Specimen Description URINE, CATHETERIZED   Final   Special Requests NONE   Final   Culture  Setup Time 09/16/2012 14:47   Final   Colony Count >=100,000 COLONIES/ML   Final   Culture ENTEROCOCCUS SPECIES   Final   Report Status 09/18/2012 FINAL   Final   Organism ID, Bacteria ENTEROCOCCUS SPECIES   Final  MRSA PCR SCREENING     Status: None   Collection Time    09/16/12  5:13 PM      Result Value Range Status   MRSA by PCR NEGATIVE  NEGATIVE Final   Comment:            The GeneXpert MRSA Assay (FDA     approved for NASAL specimens     only), is one component of a     comprehensive MRSA colonization     surveillance program.  It is not     intended to diagnose MRSA     infection nor to guide or     monitor treatment for     MRSA infections.  URINE CULTURE     Status: None   Collection Time    09/16/12  5:45 PM      Result Value  Range Status   Specimen Description URINE, CATHETERIZED   Final   Special Requests NONE   Final   Culture  Setup Time 09/16/2012 21:21   Final   Colony Count NO GROWTH   Final   Culture NO GROWTH   Final   Report Status 09/18/2012 FINAL   Final  CULTURE, BLOOD (ROUTINE X 2)     Status: None   Collection Time    09/20/12  4:50 PM      Result Value Range Status   Specimen Description BLOOD LEFT ARM   Final   Special Requests BOTTLES DRAWN AEROBIC ONLY 10CC   Final   Culture  Setup Time 09/20/2012 22:28   Final   Culture     Final   Value:        BLOOD CULTURE RECEIVED NO GROWTH TO DATE CULTURE WILL BE HELD FOR 5 DAYS BEFORE ISSUING A FINAL NEGATIVE REPORT   Report Status PENDING   Incomplete  CULTURE, BLOOD (ROUTINE X 2)     Status: None   Collection Time    09/20/12  4:50 PM      Result Value Range Status   Specimen Description BLOOD LEFT HAND   Final   Special Requests BOTTLES DRAWN AEROBIC ONLY 4CC   Final   Culture  Setup Time 09/20/2012 22:27   Final   Culture     Final   Value:        BLOOD CULTURE RECEIVED NO GROWTH TO DATE CULTURE WILL BE HELD FOR 5 DAYS BEFORE ISSUING A FINAL NEGATIVE REPORT   Report Status PENDING   Incomplete   Assessment: Repeat blood cultures are negative to date. Would treat coagulase negative staph bacteremia with vancomycin for 14 days which would also treat her enterococcal UTI.  Plan: 1. CoNS bacteremia: Continue vancomycin through 09/30/12 2. Enterococcal uti: vancomycin will cover her complicated uti.  Will sign off. Please call back for questions.  Judyann Munson, MD [facility] [phone] pager   [phone] cell 09/24/2012, 9:23 AM

## 2012-09-25 DIAGNOSIS — D649 Anemia, unspecified: Secondary | ICD-10-CM

## 2012-09-25 LAB — GLUCOSE, CAPILLARY: Glucose-Capillary: 125 mg/dL — ABNORMAL HIGH (ref 70–99)

## 2012-09-25 MED ORDER — DOCUSATE SODIUM 50 MG/5ML PO LIQD: 100.0000 mg | Freq: Two times a day (BID) | ORAL | Status: DC

## 2012-09-25 MED ORDER — POLYETHYLENE GLYCOL 3350 17 G PO PACK: 17.0000 g | PACK | Freq: Every day | ORAL | Status: DC

## 2012-09-25 MED ORDER — VANCOMYCIN HCL 10 G IV SOLR: 1250.0000 mg | Freq: Two times a day (BID) | INTRAVENOUS | Status: DC

## 2012-09-25 NOTE — Discharge Summary (Addendum)
Physician Discharge Summary  Jenny Brady ZOX:[id number] DOB: 13-Feb-1926 DOA: 09/16/2012  PCP: [name], MD  Admit date: 09/16/2012 Discharge date: 09/25/2012  Time spent: 45 min  Recommendations for Outpatient Follow-up:  D/c picc after vancomycin complete  Discharge Diagnoses:   Shock secondary to sepsis and GI bleed  Staph epi sepsis  Enterococcal UTI  Lower GI bleed in the setting of fecal impaction and anticoagulation, resolved    Chronic systolic congestive heart failure, NYHA class 3, compensated, EF 15%    Hyperthyroidism-s/p RAI admin    Coronary artery disease    GERD (gastroesophageal reflux disease)    CKD (chronic kidney disease) stage 3, GFR 30-59 ml/min    H/O ischemic left MCA stroke, aphasic, bedridden, right hemiparesis, post PEG    Dysphagia, unspecified(787.20)     Acute respiratory failure with hypoxia  Acute blood loss anemia    Fecal impaction  Discharge Condition: stable  Filed Weights   09/22/12 2017 09/23/12 2124 09/24/12 0445  Weight: 73.2 kg (161 lb 6 oz) 74 kg (163 lb 2.3 oz) 80.1 kg (176 lb 9.4 oz)    History of present illness:   79 [facility] old female resident [facility]. Sent to the ER because of rectal bleeding, fever and urinary retention. She was found to be hypotensive in the emergency department. She was admitted to the ICU by critical care medicine. CT of the abdomen and pelvis did reveal fecal impaction and diffuse colonic diverticulosis as well as moderate bilateral renal pelvocaliectasis ureterectasis. Started on broad spectrum antibiotics. Fluid resuscitated. transfused 2 units of packed red blood cells. She was on Coumadin prior to admission for suspected mural thrombus. Reversed 2 units of FFP to reverse her INR (3.14).  GI consulted. Bleeding stopped. Bowel regimen initiated.Tube feeds resumed. Discussed  coumadin with cardiology. Repeat TTE with contrast showed no thrombus or vegetation, so  coumadin not resumed.  Risk > benefit.  Urine grew enterococcus. 2/2 Blood cultures grew out staph epi.  Repeat cultures are negative to date.  ID recommend 2 weeks total vanc. Picc placed.  Patient stable for transfer back to SNF.   Procedures: picc line  Consultations:  ID  GI  Discharge Exam: Filed Vitals:   09/24/12 1000 09/24/12 1400 09/24/12 1759 09/24/12 1959  BP: 103/56 110/60 99/62 125/59  Pulse: 86 82 80 92  Temp: 98.4 F (36.9 C) 98.6 F (37 C) 98.6 F (37 C) 98.3 F (36.8 C)  TempSrc: Oral Oral Oral Oral  Resp: 20 20 20    Height:      Weight:      SpO2: 98% 98% 96% 98%    General: nonverbal Cardiovascular: RRR without MGR Respiratory: CTA without WRR Ext. 2+ edema in hands and legs  Discharge Instructions     Medication List    STOP taking these medications       enoxaparin 150 MG/ML injection  Commonly known as:  LOVENOX     warfarin 6 MG tablet  Commonly known as:  COUMADIN      TAKE these medications       acetaminophen 325 MG tablet  Commonly known as:  TYLENOL  Place 650 mg into feeding tube every 4 (four) hours as needed for pain or fever.     albuterol (5 MG/ML) 0.5% nebulizer solution  Commonly known as:  PROVENTIL  Take 0.5 mLs (2.5 mg total) by nebulization every 4 (four) hours as needed for wheezing or shortness of breath.  cholecalciferol 1000 UNITS tablet  Commonly known as:  VITAMIN D  Place 1 tablet (1,000 Units total) into feeding tube daily.     cyanocobalamin 500 MCG tablet  Place 500 mcg into feeding tube daily.     docusate 50 MG/5ML liquid  Commonly known as:  COLACE  Place 10 mLs (100 mg total) into feeding tube 2 (two) times daily.     feeding supplement (JEVITY 1.5 CAL) Liqd  Place 1,000 mLs into feeding tube continuous. 25ml/hr; on at 6am off at 2am     free water Soln  Place 150 mLs into feeding tube every 4 (four) hours.     furosemide 10 MG/ML solution  Commonly known as:  LASIX  Place 40 mg into  feeding tube daily.     levothyroxine 75 MCG tablet  Commonly known as:  SYNTHROID, LEVOTHROID  Place 1 tablet (75 mcg total) into feeding tube daily before breakfast.     lisinopril 2.5 MG tablet  Commonly known as:  ZESTRIL  Place 1 tablet (2.5 mg total) into feeding tube daily.     memantine 5 MG tablet  Commonly known as:  NAMENDA  Take 5 mg by mouth 2 (two) times daily.     pantoprazole sodium 40 mg/20 mL Pack  Commonly known as:  PROTONIX  Place 20 mLs (40 mg total) into feeding tube at bedtime.     polyethylene glycol packet  Commonly known as:  MIRALAX / GLYCOLAX  Take 17 g by mouth daily.     Protein Powd  Place 1 scoop into feeding tube daily. Mix with water     simvastatin 20 MG tablet  Commonly known as:  ZOCOR  Place 1 tablet (20 mg total) into feeding tube daily at 6 PM.     sodium chloride 0.9 % SOLN 250 mL with vancomycin 10 G SOLR 1,250 mg  Inject 1,250 mg into the vein every 12 (twelve) hours. Through 09/30/12 then stop       Allergies  Allergen Reactions  . Hyoscyamine   . Ivp Dye (Iodinated Diagnostic Agents)   . Septra (Sulfamethoxazole W-Trimethoprim)       The results of significant diagnostics from this hospitalization (including imaging, microbiology, ancillary and laboratory) are listed below for reference.    Significant Diagnostic Studies: Ct Abdomen Pelvis Wo Contrast  09/16/2012   *RADIOLOGY REPORT*  Clinical Data: Abdominal distention, pain, and tenderness.  Rectal bleeding.  Urinary tract infection.  Patient on Coumadin.  CT ABDOMEN AND PELVIS WITHOUT CONTRAST  Technique:  Multidetector CT imaging of the abdomen and pelvis was performed following the standard protocol without intravenous contrast.  Comparison: 03/18/2004  Findings: A large amount of stool is seen particularly in a markedly dilated rectum, suspicious for fecal impaction.  Foley catheter is seen within the bladder which is decompressed. Moderate bilateral renal  pelvicaliectasis and ureterectasis is demonstrated, but no obstructing ureteral calculi identified. Multiple pelvic phleboliths noted. No evidence of perinephric inflammatory changes or fluid collections.  Diffuse colonic diverticulosis is again demonstrated, however there is no evidence of diverticulitis.  No evidence of dilated small bowel loops.  Percutaneous gastrostomy tube seen in appropriate position.  Noncontrast images of the liver, gallbladder, spleen, pancreas, and adrenal glands are unremarkable in appearance.  No soft tissue masses or lymphadenopathy identified.  No suspicious bone lesions identified.  IMPRESSION:  1. Moderate bilateral renal pelvicaliectasis and ureterectasis.  No obstructing etiology identified by CT. 2.  Large amount stool particularly in dilated rectum,  suspicious for fecal impaction. 3.  Diverticulosis.  No radiographic evidence of diverticulitis.   Original Report Authenticated By: Myles Rosenthal, M.D.   Dg Chest Port 1 View  09/17/2012   *RADIOLOGY REPORT*  Clinical Data: Follow up of left upper lobe airspace disease, some shortness of breath  PORTABLE CHEST - 1 VIEW  Comparison: Portable chest x-ray of 09/16/2012  Findings: Aeration of the left upper lung field has improved.  Only mild left basilar linear atelectasis is present.  No focal infiltrate or effusion is noted.  Cardiomegaly is stable.  IMPRESSION: No active infiltrate.  Mild linear atelectasis at the left lung base.   Original Report Authenticated By: Dwyane Dee, M.D.   Dg Chest Port 1 View  09/16/2012   *RADIOLOGY REPORT*  Clinical Data: GI bleed, hypotension  PORTABLE CHEST - 1 VIEW  Comparison: 08/05/2012  Findings: Possible mild patchy opacity in the medial left upper lobe.  Lungs otherwise essentially clear.  No frank interstitial edema. Prior left lower lobe atelectasis / effusion are improved.  No pneumothorax.  Mild cardiomegaly.  IMPRESSION: Possible mild patchy opacity in the medial left upper lobe.  PA/lateral chest radiographs are suggested for further evaluation.  Prior left lower lobe atelectasis / effusion are improved.  No frank interstitial edema.   Original Report Authenticated By: Charline Bills, M.D.   Dg Abd Portable 1v  09/17/2012   *RADIOLOGY REPORT*  Clinical Data: Constipation and pain  PORTABLE ABDOMEN - 1 VIEW  Comparison: August 02, 2012  Findings: The rectum and distal sigmoid are distended with stool. There is moderate stool elsewhere in the colon.  There are multiple contrast filled diverticula throughout the colon.  No obstruction or free air is seen on this supine examination.  A rectal thermometer is in place.  IMPRESSION: Distension of rectum and distal sigmoid colon with stool.  Colonic diverticulosis.  No obstruction or free air appreciated.   Original Report Authenticated By: Bretta Bang, M.D.    Microbiology: Recent Results (from the past 240 hour(s))  CULTURE, BLOOD (ROUTINE X 2)     Status: None   Collection Time    09/16/12  1:45 PM      Result Value Range Status   Specimen Description BLOOD ARM LEFT   Final   Special Requests BOTTLES DRAWN AEROBIC AND ANAEROBIC 10CC   Final   Culture  Setup Time 09/16/2012 16:39   Final   Culture     Final   Value: STAPHYLOCOCCUS SPECIES (COAGULASE NEGATIVE)     Note: SUSCEPTIBILITIES PERFORMED ON PREVIOUS CULTURE WITHIN THE LAST 5 DAYS.     Note: Gram Stain Report Called to,Read Back By and Verified With: MONICA L @ 1240 09/17/12 BY KRAWS   Report Status 09/20/2012 FINAL   Final  CULTURE, BLOOD (ROUTINE X 2)     Status: None   Collection Time    09/16/12  2:10 PM      Result Value Range Status   Specimen Description BLOOD ARM LEFT   Final   Special Requests BOTTLES DRAWN AEROBIC AND ANAEROBIC 10CC   Final   Culture  Setup Time 09/16/2012 16:39   Final   Culture     Final   Value: STAPHYLOCOCCUS SPECIES (COAGULASE NEGATIVE)     Note: RIFAMPIN AND GENTAMICIN SHOULD NOT BE USED AS SINGLE DRUGS FOR TREATMENT OF  STAPH INFECTIONS.     Note: Gram Stain Report Called to,Read Back By and Verified With: DOROTHY MUHORO ON 09/17/2012 AT 8:59P BY Serafina Mitchell  Report Status 09/20/2012 FINAL   Final   Organism ID, Bacteria STAPHYLOCOCCUS SPECIES (COAGULASE NEGATIVE)   Final  URINE CULTURE     Status: None   Collection Time    09/16/12  2:27 PM      Result Value Range Status   Specimen Description URINE, CATHETERIZED   Final   Special Requests NONE   Final   Culture  Setup Time 09/16/2012 14:47   Final   Colony Count >=100,000 COLONIES/ML   Final   Culture ENTEROCOCCUS SPECIES   Final   Report Status 09/18/2012 FINAL   Final   Organism ID, Bacteria ENTEROCOCCUS SPECIES   Final  MRSA PCR SCREENING     Status: None   Collection Time    09/16/12  5:13 PM      Result Value Range Status   MRSA by PCR NEGATIVE  NEGATIVE Final   Comment:            The GeneXpert MRSA Assay (FDA     approved for NASAL specimens     only), is one component of a     comprehensive MRSA colonization     surveillance program. It is not     intended to diagnose MRSA     infection nor to guide or     monitor treatment for     MRSA infections.  URINE CULTURE     Status: None   Collection Time    09/16/12  5:45 PM      Result Value Range Status   Specimen Description URINE, CATHETERIZED   Final   Special Requests NONE   Final   Culture  Setup Time 09/16/2012 21:21   Final   Colony Count NO GROWTH   Final   Culture NO GROWTH   Final   Report Status 09/18/2012 FINAL   Final  CULTURE, BLOOD (ROUTINE X 2)     Status: None   Collection Time    09/20/12  4:50 PM      Result Value Range Status   Specimen Description BLOOD LEFT ARM   Final   Special Requests BOTTLES DRAWN AEROBIC ONLY 10CC   Final   Culture  Setup Time 09/20/2012 22:28   Final   Culture     Final   Value:        BLOOD CULTURE RECEIVED NO GROWTH TO DATE CULTURE WILL BE HELD FOR 5 DAYS BEFORE ISSUING A FINAL NEGATIVE REPORT   Report Status PENDING   Incomplete   CULTURE, BLOOD (ROUTINE X 2)     Status: None   Collection Time    09/20/12  4:50 PM      Result Value Range Status   Specimen Description BLOOD LEFT HAND   Final   Special Requests BOTTLES DRAWN AEROBIC ONLY 4CC   Final   Culture  Setup Time 09/20/2012 22:27   Final   Culture     Final   Value:        BLOOD CULTURE RECEIVED NO GROWTH TO DATE CULTURE WILL BE HELD FOR 5 DAYS BEFORE ISSUING A FINAL NEGATIVE REPORT   Report Status PENDING   Incomplete     Labs: Basic Metabolic Panel:  Recent Labs Lab 09/18/12 0732 09/19/12 0505 09/20/12 1650 09/23/12 0621  NA 147* 144 140 136  K 2.8* 3.7 4.4 5.0  CL 110 111 104 99  CO2 28 25 29 26   GLUCOSE 93 88 136* 161*  BUN 20 17 10 17   CREATININE 0.46* 0.37*  0.33* 0.39*  CALCIUM 8.4 8.7 8.5 8.7  MG  --  1.8  --   --    Liver Function Tests:  Recent Labs Lab 09/18/12 0732  AST 17  ALT 16  ALKPHOS 71  BILITOT 0.7  PROT 5.9*  ALBUMIN 2.0*   No results found for this basename: LIPASE, AMYLASE,  in the last 168 hours No results found for this basename: AMMONIA,  in the last 168 hours CBC:  Recent Labs Lab 09/18/12 0732 09/19/12 0505 09/20/12 1650 09/23/12 0621  WBC 7.5 5.4  --  7.2  HGB 10.5* 11.1* 12.1 11.6*  HCT 33.1* 34.4* 37.8 35.1*  MCV 82.5 82.3  --  81.1  PLT 180 204  --  PLATELET CLUMPS NOTED ON SMEAR, COUNT APPEARS ADEQUATE   Cardiac Enzymes: No results found for this basename: CKTOTAL, CKMB, CKMBINDEX, TROPONINI,  in the last 168 hours BNP: BNP (last 3 results)  Recent Labs  07/21/12 1635  PROBNP 9584.0*   CBG:  Recent Labs Lab 09/24/12 0740 09/24/12 1225 09/24/12 1628 09/24/12 2020 09/25/12 0008  GLUCAP 176* 157* 148* 151* 163*   Signed:  Negin Hegg L  [facility] 09/25/2012, 1:03 AM

## 2012-09-25 NOTE — Progress Notes (Signed)
Pt ready to be discharged to [facility]. Report called to [facility] by Gala Romney., RN. Pt's PICC line has been capped for discharge. Foley to remain in. PEG tube feeding stopped for transport. Assessment unchanged from morning. VSS.

## 2012-09-25 NOTE — Progress Notes (Signed)
NUTRITION FOLLOW UP  Intervention:   1. Continue Jevity 1.2 at 60 mL/hr with Beneprotein once daily meets 100% estimated energy and protein needs. 2. Diet per SLP 3. RD to continue to follow nutrition care plan  Nutrition Dx:   Inadequate oral intake related to inability to eat as evidenced by NPO status. Ongoing.  Goal:  Initiate enteral nutrition; intake to meet at least 90% of estimated needs. Met.  Monitor:  weight trends, lab trends, I/O's, enteral nutrition, diet initiation   Assessment:   Admitted with GIB. Hx of CVA, aphasic. From [facility]. Pt's HCPOA is an attorney while family matters being settled. CT of abd revealed fecal impaction, diffuse colonic diverticulosis. Pt has PEG; feeding tube placed 4/25 2/2 pt unable to meet nutrition needs on dysphagia 1, pudding thickened liquid diet.  Pt seen by GI for rectal bleeding.  TF resumed by MD.  Pt currently receiving Jevity 1.2 at 60 mL/hr with beneprotein once daily, provides 1752 kcal, 85g protein, 1180 mL free water.   Discussed with RN who reports tolerance.  Height: Ht Readings from Last 1 Encounters:  09/16/12 5' 4.96" (1.65 m)    Weight Status:   Wt Readings from Last 1 Encounters:  09/24/12 176 lb 9.4 oz (80.1 kg)  Stable  Re-estimated needs:  Kcal: 1600-1800 Protein: 75-85g Fluid: 1.6-1.8 L/day  Skin: no issues noted  Diet Order:     Intake/Output Summary (Last 24 hours) at 09/25/12 1000 Last data filed at 09/25/12 0622  Gross per 24 hour  Intake    920 ml  Output   1100 ml  Net   -180 ml    Last BM: 6/17  Labs:   Recent Labs Lab 09/19/12 0505 09/20/12 1650 09/23/12 0621  NA 144 140 136  K 3.7 4.4 5.0  CL 111 104 99  CO2 25 29 26   BUN 17 10 17   CREATININE 0.37* 0.33* 0.39*  CALCIUM 8.7 8.5 8.7  MG 1.8  --   --   GLUCOSE 88 136* 161*    CBG (last 3)   Recent Labs  09/25/12 0008 09/25/12 0411 09/25/12 0731  GLUCAP 163* 172* 125*    Scheduled Meds: . cyanocobalamin   500 mcg Per Tube Daily  . docusate  100 mg Per Tube BID  . furosemide  40 mg Per Tube Daily  . insulin aspart  0-9 Units Subcutaneous Q4H  . levothyroxine  75 mcg Per Tube QAC breakfast  . memantine  5 mg Per Tube BID  . pantoprazole sodium  40 mg Per Tube QHS  . polyethylene glycol  17 g Per Tube Daily  . protein supplement  1 scoop Oral Daily  . simvastatin  20 mg Per Tube q1800  . sodium phosphate  1 enema Rectal Once  . vancomycin  1,250 mg Intravenous Q12H    Continuous Infusions: . feeding supplement (JEVITY 1.2 CAL) 1,000 mL (09/24/12 2249)    Jarold Motto MS, RD, LDN Pager: [phone] After-hours pager: [phone]

## 2012-09-25 NOTE — Progress Notes (Signed)
[facility] PROGRESS NOTE  Jenny Brady HQI:[id number] DOB: [birth year] DOA: 09/16/2012 PCP: Jenny Peck, MD  Assessment/Plan  Shock secondary to sepsis and GI bleed, resolved Staph epi sepsis.  Continue antibiotics Enterococcal UTI  Lower GI bleed in the setting of fecal impaction and anticoagulation, resolved  Chronic systolic congestive heart failure, NYHA class 3, compensated, EF 15%  Hyperthyroidism-s/p RAI admin  Coronary artery disease, stable GERD (gastroesophageal reflux disease), stable CKD (chronic kidney disease) stage 3, GFR 30-59 ml/min, stable H/O ischemic left MCA stroke, aphasic, bedridden, right hemiparesis, post PEG, stable Dysphagia, unspecified(787.20), continue tube feeds Acute respiratory failure with hypoxia, stable Acute blood loss anemia, stable. Fecal impaction, resolved.  No change to current plan, anticipate transfer to skilled nursing facility later today. Patient is medically stable for transport.  Code Status: Full Family Communication: Spoke with the patient alone Disposition Plan: As above    Antibiotics: Vancomycin 6/11 >> continue through 6/24, then stop  HPI/Subjective:  Patient with dementia due to stroke and difficulty communicating.  Objective: Filed Vitals:   09/24/12 1759 09/24/12 1959 09/25/12 0300 09/25/12 0943  BP: 99/62 125/59 111/47 118/52  Pulse: 80 92 86 88  Temp: 98.6 F (37 C) 98.3 F (36.8 C) 99.3 F (37.4 C) 98.8 F (37.1 C)  TempSrc: Oral Oral Axillary Axillary  Resp: 20  18 18   Height:      Weight:      SpO2: 96% 98% 94% 96%    Intake/Output Summary (Last 24 hours) at 09/25/12 1523 Last data filed at 09/25/12 1025  Gross per 24 hour  Intake    920 ml  Output   1250 ml  Net   -330 ml   Filed Weights   09/22/12 2017 09/23/12 2124 09/24/12 0445  Weight: 73.2 kg (161 lb 6 oz) 74 kg (163 lb 2.3 oz) 80.1 kg (176 lb 9.4 oz)    Exam:   General:  [name], No acute  distress  HEENT:  NCAT, MMM  Cardiovascular:  RRR, nl S1, S2 no mrg, 2+ pulses, warm extremities  Respiratory:  CTAB, no increased WOB  Abdomen:   NABS, soft, NT/ND, G-tube infusing c/d/i  MSK:   Normal tone and bulk, trace LEE, 2+ right hand edema  Data Reviewed: Basic Metabolic Panel:  Recent Labs Lab 09/19/12 0505 09/20/12 1650 09/23/12 0621  NA 144 140 136  K 3.7 4.4 5.0  CL 111 104 99  CO2 25 29 26   GLUCOSE 88 136* 161*  BUN 17 10 17   CREATININE 0.37* 0.33* 0.39*  CALCIUM 8.7 8.5 8.7  MG 1.8  --   --    Liver Function Tests: No results found for this basename: AST, ALT, ALKPHOS, BILITOT, PROT, ALBUMIN,  in the last 168 hours No results found for this basename: LIPASE, AMYLASE,  in the last 168 hours No results found for this basename: AMMONIA,  in the last 168 hours CBC:  Recent Labs Lab 09/19/12 0505 09/20/12 1650 09/23/12 0621  WBC 5.4  --  7.2  HGB 11.1* 12.1 11.6*  HCT 34.4* 37.8 35.1*  MCV 82.3  --  81.1  PLT 204  --  PLATELET CLUMPS NOTED ON SMEAR, COUNT APPEARS ADEQUATE   Cardiac Enzymes: No results found for this basename: CKTOTAL, CKMB, CKMBINDEX, TROPONINI,  in the last 168 hours BNP (last 3 results)  Recent Labs  07/21/12 1635  PROBNP 9584.0*   CBG:  Recent Labs Lab 09/24/12 2020 09/25/12 0008 09/25/12 0411 09/25/12 0731 09/25/12 1142  GLUCAP 151* 163* 172* 125* 165*    Recent Results (from the past 240 hour(s))  CULTURE, BLOOD (ROUTINE X 2)     Status: None   Collection Time    09/16/12  1:45 PM      Result Value Range Status   Specimen Description BLOOD ARM LEFT   Final   Special Requests BOTTLES DRAWN AEROBIC AND ANAEROBIC 10CC   Final   Culture  Setup Time 09/16/2012 16:39   Final   Culture     Final   Value: STAPHYLOCOCCUS SPECIES (COAGULASE NEGATIVE)     Note: SUSCEPTIBILITIES PERFORMED ON PREVIOUS CULTURE WITHIN THE LAST 5 DAYS.     Note: Gram Stain Report Called to,Read Back By and Verified With: MONICA L @ 1240  09/17/12 BY KRAWS   Report Status 09/20/2012 FINAL   Final  CULTURE, BLOOD (ROUTINE X 2)     Status: None   Collection Time    09/16/12  2:10 PM      Result Value Range Status   Specimen Description BLOOD ARM LEFT   Final   Special Requests BOTTLES DRAWN AEROBIC AND ANAEROBIC 10CC   Final   Culture  Setup Time 09/16/2012 16:39   Final   Culture     Final   Value: STAPHYLOCOCCUS SPECIES (COAGULASE NEGATIVE)     Note: RIFAMPIN AND GENTAMICIN SHOULD NOT BE USED AS SINGLE DRUGS FOR TREATMENT OF STAPH INFECTIONS.     Note: Gram Stain Report Called to,Read Back By and Verified With: DOROTHY [facility] ON 09/17/2012 AT 8:59P BY WILEJ   Report Status 09/20/2012 FINAL   Final   Organism ID, Bacteria STAPHYLOCOCCUS SPECIES (COAGULASE NEGATIVE)   Final  URINE CULTURE     Status: None   Collection Time    09/16/12  2:27 PM      Result Value Range Status   Specimen Description URINE, CATHETERIZED   Final   Special Requests NONE   Final   Culture  Setup Time 09/16/2012 14:47   Final   Colony Count >=100,000 COLONIES/ML   Final   Culture ENTEROCOCCUS SPECIES   Final   Report Status 09/18/2012 FINAL   Final   Organism ID, Bacteria ENTEROCOCCUS SPECIES   Final  MRSA PCR SCREENING     Status: None   Collection Time    09/16/12  5:13 PM      Result Value Range Status   MRSA by PCR NEGATIVE  NEGATIVE Final   Comment:            The GeneXpert MRSA Assay (FDA     approved for NASAL specimens     only), is one component of a     comprehensive MRSA colonization     surveillance program. It is not     intended to diagnose MRSA     infection nor to guide or     monitor treatment for     MRSA infections.  URINE CULTURE     Status: None   Collection Time    09/16/12  5:45 PM      Result Value Range Status   Specimen Description URINE, CATHETERIZED   Final   Special Requests NONE   Final   Culture  Setup Time 09/16/2012 21:21   Final   Colony Count NO GROWTH   Final   Culture NO GROWTH   Final    Report Status 09/18/2012 FINAL   Final  CULTURE, BLOOD (ROUTINE X 2)     Status: None  Collection Time    09/20/12  4:50 PM      Result Value Range Status   Specimen Description BLOOD LEFT ARM   Final   Special Requests BOTTLES DRAWN AEROBIC ONLY 10CC   Final   Culture  Setup Time 09/20/2012 22:28   Final   Culture     Final   Value:        BLOOD CULTURE RECEIVED NO GROWTH TO DATE CULTURE WILL BE HELD FOR 5 DAYS BEFORE ISSUING A FINAL NEGATIVE REPORT   Report Status PENDING   Incomplete  CULTURE, BLOOD (ROUTINE X 2)     Status: None   Collection Time    09/20/12  4:50 PM      Result Value Range Status   Specimen Description BLOOD LEFT HAND   Final   Special Requests BOTTLES DRAWN AEROBIC ONLY 4CC   Final   Culture  Setup Time 09/20/2012 22:27   Final   Culture     Final   Value:        BLOOD CULTURE RECEIVED NO GROWTH TO DATE CULTURE WILL BE HELD FOR 5 DAYS BEFORE ISSUING A FINAL NEGATIVE REPORT   Report Status PENDING   Incomplete     Studies: No results found.  Scheduled Meds: . cyanocobalamin  500 mcg Per Tube Daily  . docusate  100 mg Per Tube BID  . furosemide  40 mg Per Tube Daily  . insulin aspart  0-9 Units Subcutaneous Q4H  . levothyroxine  75 mcg Per Tube QAC breakfast  . memantine  5 mg Per Tube BID  . pantoprazole sodium  40 mg Per Tube QHS  . polyethylene glycol  17 g Per Tube Daily  . protein supplement  1 scoop Oral Daily  . simvastatin  20 mg Per Tube q1800  . sodium phosphate  1 enema Rectal Once  . vancomycin  1,250 mg Intravenous Q12H   Continuous Infusions: . feeding supplement (JEVITY 1.2 CAL) 1,000 mL (09/24/12 2249)    Active Problems:   HYPOTENSION   Hyperthyroidism-s/p RAI admin   Coronary artery disease   GERD (gastroesophageal reflux disease)   CKD (chronic kidney disease) stage 3, GFR 30-59 ml/min   H/O ischemic left MCA stroke   Chronic systolic congestive heart failure, NYHA class 3   UTI (lower urinary tract infection)   Dementia  with behavioral disturbance   Dysphagia, unspecified(787.20)   Long term (current) use of anticoagulants   Rectal bleed   Sepsis   HCAP (healthcare-associated pneumonia)   Acute respiratory failure with hypoxia   Mural thrombus of heart   Anemia of chronic disease   Fecal impaction    Time spent: 30 min    Jenny Brady  [facility] Pager [phone]. If 7PM-7AM, please contact night-coverage at [email or web address], password [facility] 09/25/2012, 3:23 PM  LOS: 9 days

## 2012-09-26 LAB — CULTURE, BLOOD (ROUTINE X 2): Culture: NO GROWTH

## 2012-10-09 ENCOUNTER — Encounter: Payer: Self-pay | Admitting: Plastic Surgery

## 2012-10-18 ENCOUNTER — Inpatient Hospital Stay (HOSPITAL_COMMUNITY)
Admission: EM | Admit: 2012-10-18 | Discharge: 2012-10-25 | DRG: 871 | Disposition: A | Discharge status: Skilled Nursing Facility | Payer: Medicare Other | Attending: Internal Medicine | Admitting: Internal Medicine

## 2012-10-18 ENCOUNTER — Emergency Department (HOSPITAL_COMMUNITY): Payer: Medicare Other

## 2012-10-18 ENCOUNTER — Encounter (HOSPITAL_COMMUNITY): Payer: Self-pay | Admitting: *Deleted

## 2012-10-18 DIAGNOSIS — A419 Sepsis, unspecified organism: Secondary | ICD-10-CM

## 2012-10-18 DIAGNOSIS — N179 Acute kidney failure, unspecified: Secondary | ICD-10-CM | POA: Diagnosis present

## 2012-10-18 DIAGNOSIS — G4733 Obstructive sleep apnea (adult) (pediatric): Secondary | ICD-10-CM | POA: Diagnosis present

## 2012-10-18 DIAGNOSIS — Z87440 Personal history of urinary (tract) infections: Secondary | ICD-10-CM

## 2012-10-18 DIAGNOSIS — N3090 Cystitis, unspecified without hematuria: Secondary | ICD-10-CM

## 2012-10-18 DIAGNOSIS — I2510 Atherosclerotic heart disease of native coronary artery without angina pectoris: Secondary | ICD-10-CM

## 2012-10-18 DIAGNOSIS — Z8673 Personal history of transient ischemic attack (TIA), and cerebral infarction without residual deficits: Secondary | ICD-10-CM

## 2012-10-18 DIAGNOSIS — N390 Urinary tract infection, site not specified: Secondary | ICD-10-CM

## 2012-10-18 DIAGNOSIS — I513 Intracardiac thrombosis, not elsewhere classified: Secondary | ICD-10-CM

## 2012-10-18 DIAGNOSIS — N12 Tubulo-interstitial nephritis, not specified as acute or chronic: Secondary | ICD-10-CM | POA: Diagnosis present

## 2012-10-18 DIAGNOSIS — M7989 Other specified soft tissue disorders: Secondary | ICD-10-CM

## 2012-10-18 DIAGNOSIS — E876 Hypokalemia: Secondary | ICD-10-CM

## 2012-10-18 DIAGNOSIS — R9389 Abnormal findings on diagnostic imaging of other specified body structures: Secondary | ICD-10-CM

## 2012-10-18 DIAGNOSIS — I69959 Hemiplegia and hemiparesis following unspecified cerebrovascular disease affecting unspecified side: Secondary | ICD-10-CM

## 2012-10-18 DIAGNOSIS — J9620 Acute and chronic respiratory failure, unspecified whether with hypoxia or hypercapnia: Secondary | ICD-10-CM | POA: Diagnosis present

## 2012-10-18 DIAGNOSIS — J189 Pneumonia, unspecified organism: Secondary | ICD-10-CM

## 2012-10-18 DIAGNOSIS — E0590 Thyrotoxicosis, unspecified without thyrotoxic crisis or storm: Secondary | ICD-10-CM

## 2012-10-18 DIAGNOSIS — N170 Acute kidney failure with tubular necrosis: Secondary | ICD-10-CM | POA: Diagnosis present

## 2012-10-18 DIAGNOSIS — L8992 Pressure ulcer of unspecified site, stage 2: Secondary | ICD-10-CM | POA: Diagnosis present

## 2012-10-18 DIAGNOSIS — I5023 Acute on chronic systolic (congestive) heart failure: Secondary | ICD-10-CM

## 2012-10-18 DIAGNOSIS — R42 Dizziness and giddiness: Secondary | ICD-10-CM

## 2012-10-18 DIAGNOSIS — R413 Other amnesia: Secondary | ICD-10-CM

## 2012-10-18 DIAGNOSIS — K2980 Duodenitis without bleeding: Secondary | ICD-10-CM

## 2012-10-18 DIAGNOSIS — K5641 Fecal impaction: Secondary | ICD-10-CM

## 2012-10-18 DIAGNOSIS — R1310 Dysphagia, unspecified: Secondary | ICD-10-CM

## 2012-10-18 DIAGNOSIS — F03918 Unspecified dementia, unspecified severity, with other behavioral disturbance: Secondary | ICD-10-CM

## 2012-10-18 DIAGNOSIS — I6350 Cerebral infarction due to unspecified occlusion or stenosis of unspecified cerebral artery: Secondary | ICD-10-CM | POA: Diagnosis present

## 2012-10-18 DIAGNOSIS — K625 Hemorrhage of anus and rectum: Secondary | ICD-10-CM

## 2012-10-18 DIAGNOSIS — R55 Syncope and collapse: Secondary | ICD-10-CM

## 2012-10-18 DIAGNOSIS — I69920 Aphasia following unspecified cerebrovascular disease: Secondary | ICD-10-CM

## 2012-10-18 DIAGNOSIS — F411 Generalized anxiety disorder: Secondary | ICD-10-CM

## 2012-10-18 DIAGNOSIS — R6521 Severe sepsis with septic shock: Secondary | ICD-10-CM | POA: Diagnosis present

## 2012-10-18 DIAGNOSIS — J9601 Acute respiratory failure with hypoxia: Secondary | ICD-10-CM

## 2012-10-18 DIAGNOSIS — N183 Chronic kidney disease, stage 3 unspecified: Secondary | ICD-10-CM

## 2012-10-18 DIAGNOSIS — R739 Hyperglycemia, unspecified: Secondary | ICD-10-CM

## 2012-10-18 DIAGNOSIS — R0902 Hypoxemia: Secondary | ICD-10-CM

## 2012-10-18 DIAGNOSIS — R6520 Severe sepsis without septic shock: Secondary | ICD-10-CM

## 2012-10-18 DIAGNOSIS — E875 Hyperkalemia: Secondary | ICD-10-CM | POA: Diagnosis present

## 2012-10-18 DIAGNOSIS — D649 Anemia, unspecified: Secondary | ICD-10-CM

## 2012-10-18 DIAGNOSIS — L89109 Pressure ulcer of unspecified part of back, unspecified stage: Secondary | ICD-10-CM | POA: Diagnosis present

## 2012-10-18 DIAGNOSIS — I5022 Chronic systolic (congestive) heart failure: Secondary | ICD-10-CM

## 2012-10-18 DIAGNOSIS — G8190 Hemiplegia, unspecified affecting unspecified side: Secondary | ICD-10-CM | POA: Diagnosis present

## 2012-10-18 DIAGNOSIS — I509 Heart failure, unspecified: Secondary | ICD-10-CM | POA: Diagnosis present

## 2012-10-18 DIAGNOSIS — E039 Hypothyroidism, unspecified: Secondary | ICD-10-CM | POA: Diagnosis present

## 2012-10-18 DIAGNOSIS — E669 Obesity, unspecified: Secondary | ICD-10-CM | POA: Diagnosis present

## 2012-10-18 DIAGNOSIS — Z6829 Body mass index (BMI) 29.0-29.9, adult: Secondary | ICD-10-CM

## 2012-10-18 DIAGNOSIS — F0390 Unspecified dementia without behavioral disturbance: Secondary | ICD-10-CM | POA: Diagnosis present

## 2012-10-18 DIAGNOSIS — I959 Hypotension, unspecified: Secondary | ICD-10-CM

## 2012-10-18 DIAGNOSIS — F0391 Unspecified dementia with behavioral disturbance: Secondary | ICD-10-CM

## 2012-10-18 DIAGNOSIS — K219 Gastro-esophageal reflux disease without esophagitis: Secondary | ICD-10-CM

## 2012-10-18 DIAGNOSIS — R0600 Dyspnea, unspecified: Secondary | ICD-10-CM

## 2012-10-18 DIAGNOSIS — Z7901 Long term (current) use of anticoagulants: Secondary | ICD-10-CM

## 2012-10-18 DIAGNOSIS — I69991 Dysphagia following unspecified cerebrovascular disease: Secondary | ICD-10-CM

## 2012-10-18 DIAGNOSIS — R269 Unspecified abnormalities of gait and mobility: Secondary | ICD-10-CM

## 2012-10-18 DIAGNOSIS — D638 Anemia in other chronic diseases classified elsewhere: Secondary | ICD-10-CM

## 2012-10-18 LAB — URINALYSIS, ROUTINE W REFLEX MICROSCOPIC
Bilirubin Urine: NEGATIVE
Protein, ur: 100 mg/dL — AB
Urobilinogen, UA: 1 mg/dL (ref 0.0–1.0)

## 2012-10-18 LAB — COMPREHENSIVE METABOLIC PANEL
ALT: 20 U/L (ref 0–35)
Alkaline Phosphatase: 85 U/L (ref 39–117)
CO2: 26 mEq/L (ref 19–32)
Chloride: 102 mEq/L (ref 96–112)
GFR calc Af Amer: 87 mL/min — ABNORMAL LOW (ref >90)
GFR calc non Af Amer: 75 mL/min — ABNORMAL LOW (ref >90)
Glucose, Bld: 222 mg/dL — ABNORMAL HIGH (ref 70–99)
Potassium: 3.6 mEq/L (ref 3.5–5.1)
Sodium: 139 mEq/L (ref 135–145)

## 2012-10-18 LAB — CBC WITH DIFFERENTIAL/PLATELET
Basophils Relative: 0 % (ref 0–1)
Eosinophils Relative: 0 % (ref 0–5)
Lymphs Abs: 1.1 10*3/uL (ref 0.7–4.0)
MCH: 28.8 pg (ref 26.0–34.0)
MCV: 89.4 fL (ref 78.0–100.0)
Monocytes Absolute: 1.4 10*3/uL — ABNORMAL HIGH (ref 0.1–1.0)
Platelets: 294 10*3/uL (ref 150–400)
RBC: 4.16 MIL/uL (ref 3.87–5.11)

## 2012-10-18 LAB — PROTIME-INR: INR: 1.2 (ref 0.00–1.49)

## 2012-10-18 LAB — POCT I-STAT 3, ART BLOOD GAS (G3+)
Bicarbonate: 26.8 mEq/L — ABNORMAL HIGH (ref 20.0–24.0)
Patient temperature: 98.6
TCO2: 28 mmol/L (ref 0–100)
pCO2 arterial: 28.9 mmHg — ABNORMAL LOW (ref 35.0–45.0)
pH, Arterial: 7.576 — ABNORMAL HIGH (ref 7.350–7.450)

## 2012-10-18 LAB — CG4 I-STAT (LACTIC ACID): Lactic Acid, Venous: 2.83 mmol/L — ABNORMAL HIGH (ref 0.5–2.2)

## 2012-10-18 LAB — URINE MICROSCOPIC-ADD ON

## 2012-10-18 LAB — LACTIC ACID, PLASMA: Lactic Acid, Venous: 2 mmol/L (ref 0.5–2.2)

## 2012-10-18 LAB — PROCALCITONIN: Procalcitonin: 0.45 ng/mL

## 2012-10-18 MED ORDER — PANTOPRAZOLE SODIUM 40 MG IV SOLR: 40.0000 mg | INTRAVENOUS | Status: DC

## 2012-10-18 MED ORDER — SODIUM CHLORIDE 0.9 % IV SOLN
1000.0000 mL | Freq: Once | INTRAVENOUS | Status: AC
  Administered 2012-10-18: 1000 mL via INTRAVENOUS

## 2012-10-18 MED ORDER — SODIUM CHLORIDE 0.9 % IV BOLUS (SEPSIS)
1000.0000 mL | Freq: Once | INTRAVENOUS | Status: AC
  Administered 2012-10-18: 1000 mL via INTRAVENOUS

## 2012-10-18 MED ORDER — HEPARIN SODIUM (PORCINE) 5000 UNIT/ML IJ SOLN
5000.0000 [IU] | Freq: Three times a day (TID) | INTRAMUSCULAR | Status: DC
  Administered 2012-10-19 – 2012-10-25 (×21): 5000 [IU] via SUBCUTANEOUS
  Filled 2012-10-18 (×23): qty 1

## 2012-10-18 MED ORDER — SODIUM CHLORIDE 0.9 % IV BOLUS (SEPSIS)
500.0000 mL | Freq: Once | INTRAVENOUS | Status: AC
  Administered 2012-10-18: 500 mL via INTRAVENOUS

## 2012-10-18 MED ORDER — SODIUM CHLORIDE 0.9 % IV SOLN: 1000.0000 mL | INTRAVENOUS | Status: DC

## 2012-10-18 MED ORDER — SODIUM CHLORIDE 0.9 % IV SOLN: 1000.0000 mL | Freq: Once | INTRAVENOUS | Status: DC

## 2012-10-18 MED ORDER — SODIUM CHLORIDE 0.9 % IV SOLN
1000.0000 mL | INTRAVENOUS | Status: DC
  Administered 2012-10-18 – 2012-10-19 (×2): 1000 mL via INTRAVENOUS

## 2012-10-18 MED ORDER — PIPERACILLIN-TAZOBACTAM 3.375 G IVPB
3.3750 g | Freq: Once | INTRAVENOUS | Status: AC
  Administered 2012-10-18: 3.375 g via INTRAVENOUS
  Filled 2012-10-18: qty 50

## 2012-10-18 MED ORDER — VANCOMYCIN HCL IN DEXTROSE 1-5 GM/200ML-% IV SOLN
1000.0000 mg | Freq: Once | INTRAVENOUS | Status: AC
  Administered 2012-10-18: 1000 mg via INTRAVENOUS

## 2012-10-18 MED ORDER — PIPERACILLIN-TAZOBACTAM 3.375 G IVPB
3.3750 g | Freq: Three times a day (TID) | INTRAVENOUS | Status: DC
  Administered 2012-10-18 – 2012-10-21 (×9): 3.375 g via INTRAVENOUS
  Filled 2012-10-18 (×11): qty 50

## 2012-10-18 MED ORDER — VANCOMYCIN HCL 10 G IV SOLR
1250.0000 mg | Freq: Two times a day (BID) | INTRAVENOUS | Status: DC
  Administered 2012-10-19 – 2012-10-20 (×4): 1250 mg via INTRAVENOUS
  Filled 2012-10-18 (×6): qty 1250

## 2012-10-18 NOTE — ED Provider Notes (Signed)
History    CSN: [id number] Arrival date & time 10/18/12  1614  First MD Initiated Contact with Patient 10/18/12 1620     Chief Complaint  Patient presents with  . Hypotension     The history is provided by medical records and the EMS personnel. History limited by: Level V caveat: Prior stroke and history of achalasia.   is a drop from the nursing home today for altered mental status and new swelling of her right upper extremity.  The patient has a prior history of stroke and aphasia as well as right-sided hemiplegia and therefore she is a able to provide any history.  She has a history of urinary tract infection-induced sepsis several weeks ago.  Nursing reports no altered mental status over the past 24 hours.  EMS reports that her oxygen saturations were 89% on room air on arrival in that her initial blood sugar was 328.  EMS reports she was hypotensive for them in route.  She recently also had a history of GI bleed and was taken off chronic anticoagulation because of her bleeding.    Past Medical History  Diagnosis Date  . Hyperthyroidism     had RAI-is on thyroid replacement, happened maybe this summer  . Coronary artery disease ?, before 2005    Had a catheterization per son by Dr. [name] in 1991 no reports in  [place].  note from 2005 mentions hx of coronary stent  . GERD (gastroesophageal reflux disease)   . Stroke 07/2012    Right hemiplegia, dysphagia, aphasia.  . Dementia   . Aphasia 07/2012  . Chronic systolic heart failure     07/2012 the EF was 15%  . Mural thrombus of cardiac apex   . Obesity   . IBS (irritable bowel syndrome)   . DVT (deep venous thrombosis) before 2005    occurred after trauma, treated for a while with Coumadin.   Marland Kitchen Dysphagia due to recent stroke 07/2012    ok'd for purree/pudding thick liquids but G tube placed by IR  . OSA (obstructive sleep apnea)     refused CPAP per notes.    Past Surgical History  Procedure Laterality Date  . Tee  without cardioversion N/A 07/26/2012    Procedure: TRANSESOPHAGEAL ECHOCARDIOGRAM (TEE);  Surgeon: Laurey Morale, MD;  Location: [facility] ENDOSCOPY;  Service: Cardiovascular;  Laterality: N/A;  . Gastric feeding tube  08/02/2012    placed in radiology   Family History  Problem Relation Age of Onset  . Acne Mother     died giving birth to 8th kid  . Dementia Father     died 46  . Acne Brother     died 2-3 yrs ago  . Cervical cancer Sister    History  Substance Use Topics  . Smoking status: Never Smoker   . Smokeless tobacco: Never Used  . Alcohol Use: No   OB History   Grav Para Term Preterm Abortions TAB SAB Ect Mult Living                 Review of Systems  Unable to perform ROS: Other    Allergies  Hyoscyamine; Ivp dye; and Septra  Home Medications   Current Outpatient Rx  Name  Route  Sig  Dispense  Refill  . acetaminophen (TYLENOL) 325 MG tablet   Per Tube   Place 650 mg into feeding tube every 4 (four) hours as needed for pain or fever.         Marland Kitchen  albuterol (PROVENTIL) (5 MG/ML) 0.5% nebulizer solution   Nebulization   Take 0.5 mLs (2.5 mg total) by nebulization every 4 (four) hours as needed for wheezing or shortness of breath.   20 mL      . cholecalciferol (VITAMIN D) 1000 UNITS tablet   Per Tube   Place 1 tablet (1,000 Units total) into feeding tube daily.         . cyanocobalamin 500 MCG tablet   Per Tube   Place 500 mcg into feeding tube daily.         Marland Kitchen docusate (COLACE) 50 MG/5ML liquid   Per Tube   Place 10 mLs (100 mg total) into feeding tube 2 (two) times daily.   100 mL   0   . furosemide (LASIX) 10 MG/ML solution   Per Tube   Place 40 mg into feeding tube daily.         Marland Kitchen levothyroxine (SYNTHROID, LEVOTHROID) 75 MCG tablet   Per Tube   Place 1 tablet (75 mcg total) into feeding tube daily before breakfast.         . lisinopril (ZESTRIL) 2.5 MG tablet   Per Tube   Place 1 tablet (2.5 mg total) into feeding tube daily.          . memantine (NAMENDA) 5 MG tablet   Oral   Take 5 mg by mouth 2 (two) times daily.         . Nutritional Supplements (FEEDING SUPPLEMENT, JEVITY 1.5 CAL,) LIQD   Per Tube   Place 1,000 mLs into feeding tube continuous. 89ml/hr; on at 6am off at 2am         . pantoprazole sodium (PROTONIX) 40 mg/20 mL PACK   Per Tube   Place 20 mLs (40 mg total) into feeding tube at bedtime.   30 each      . polyethylene glycol (MIRALAX / GLYCOLAX) packet   Oral   Take 17 g by mouth daily.   14 each   0   . Protein POWD   Per Tube   Place 1 scoop into feeding tube daily. Mix with water         . simvastatin (ZOCOR) 20 MG tablet   Per Tube   Place 1 tablet (20 mg total) into feeding tube daily at 6 PM.   30 tablet      . sodium chloride 0.9 % SOLN 250 mL with vancomycin 10 G SOLR 1,250 mg   Intravenous   Inject 1,250 mg into the vein every 12 (twelve) hours. Through 09/30/12 then stop         . Water For Irrigation, Sterile (FREE WATER) SOLN   Per Tube   Place 150 mLs into feeding tube every 4 (four) hours.          There were no vitals taken for this visit. Physical Exam  Nursing note and vitals reviewed. Constitutional: She appears well-developed and well-nourished. No distress.  HENT:  Head: Normocephalic and atraumatic.  Eyes: EOM are normal.  Neck: Normal range of motion.  Cardiovascular: Normal rate, regular rhythm and normal heart sounds.   Pulmonary/Chest: Effort normal and breath sounds normal.  Abdominal: Soft. She exhibits no distension. There is no tenderness.  G-tube in place without surrounding signs of infection.  Musculoskeletal: Normal range of motion.  Right upper extremity swelling with normal right radial pulse.  Bilateral 2+ lower extremity pitting edema.  Neurological: She is alert.  Does not follow simple  commands.  Right-sided hemiplegia.  Does not grip hand with left hand.  Skin: Skin is warm and dry.  Psychiatric: She has a normal  mood and affect. Judgment normal.    ED Course  Procedures (including critical care time)  CRITICAL CARE Performed by: Lyanne Co Total critical care time: 35 Critical care time was exclusive of separately billable procedures and treating other patients. Critical care was necessary to treat or prevent imminent or life-threatening deterioration. Critical care was time spent personally by me on the following activities: development of treatment plan with patient and/or surrogate as well as nursing, discussions with consultants, evaluation of patient's response to treatment, examination of patient, obtaining history from patient or surrogate, ordering and performing treatments and interventions, ordering and review of laboratory studies, ordering and review of radiographic studies, pulse oximetry and re-evaluation of patient's condition.   Labs Reviewed  CBC WITH DIFFERENTIAL - Abnormal; Notable for the following:    WBC 12.6 (*)    RDW 20.6 (*)    Neutrophils Relative % 80 (*)    Lymphocytes Relative 9 (*)    Neutro Abs 10.1 (*)    Monocytes Absolute 1.4 (*)    All other components within normal limits  COMPREHENSIVE METABOLIC PANEL - Abnormal; Notable for the following:    Glucose, Bld 222 (*)    BUN 55 (*)    Albumin 2.1 (*)    GFR calc non Af Amer 75 (*)    GFR calc Af Amer 87 (*)    All other components within normal limits  URINALYSIS, ROUTINE W REFLEX MICROSCOPIC - Abnormal; Notable for the following:    APPearance TURBID (*)    Hgb urine dipstick LARGE (*)    Protein, ur 100 (*)    Leukocytes, UA LARGE (*)    All other components within normal limits  POCT I-STAT 3, BLOOD GAS (G3+) - Abnormal; Notable for the following:    pH, Arterial 7.576 (*)    pCO2 arterial 28.9 (*)    pO2, Arterial 155.0 (*)    Bicarbonate 26.8 (*)    Acid-Base Excess 6.0 (*)    All other components within normal limits  CG4 I-STAT (LACTIC ACID) - Abnormal; Notable for the following:     Lactic Acid, Venous 2.83 (*)    All other components within normal limits  CULTURE, BLOOD (ROUTINE X 2)  CULTURE, BLOOD (ROUTINE X 2)  URINE CULTURE  TROPONIN I  AMMONIA  PROCALCITONIN  PROTIME-INR  URINE MICROSCOPIC-ADD ON   Ct Head Wo Contrast  10/18/2012   *RADIOLOGY REPORT*  Clinical Data: Altered mental status  CT HEAD WITHOUT CONTRAST  Technique:  Contiguous axial images were obtained from the base of the skull through the vertex without contrast.  Comparison: 07/21/2012  Findings: Moderate to advanced atrophy.  Chronic left MCA infarct with progressive encephalomalacia since the prior study.  Chronic left occipital infarct is unchanged.  No acute infarct, hemorrhage, or mass lesion.  IMPRESSION: Atrophy.  Chronic left MCA and left PCA infarct.  No acute abnormality.   Original Report Authenticated By: Janeece Riggers, M.D.   Dg Chest Portable 1 View  10/18/2012   *RADIOLOGY REPORT*  Clinical Data: Fever.  Right hand and arm swelling.  PORTABLE CHEST - 1 VIEW  Comparison: 09/17/2012.  Findings: High poor inspiration.  Normal sized heart.  Clear lungs with diffuse peribronchial thickening.  Thoracic spine degenerative changes.  Linear density at the left lung base.  IMPRESSION:  1.  Mild left basilar  atelectasis or scarring.  2.  Mild bronchitic changes.   Original Report Authenticated By: Beckie Salts, M.D.   I personally reviewed the imaging tests through PACS system I reviewed available ER/hospitalization records through the EMR   1. Acute respiratory failure with hypoxia   2, urosepsis 3. hypotension  MDM  Patient presents with sepsis with tachycardia, hypotension, altered mental status.  Antibiotics initiated on arrival to the emergency department.  2 L bolus.  Could sepsis called.  At this time the patient is a full code.  Initial ABG demonstrates a pH 7.5 CO2 of 28 and O2 of 150 and a bicarbonate 26.  CT head and chest x-ray without acute abnormalities.  8:52 PM Despite 3 L of  fluid the patient's blood pressures now fallen back to 95 systolic.  I discussed the case with the pulmonary critical care as well as the hospitalist.  Refills of the patient will be best served in the intensive care unit through the night.  Additional IV fluids will be written for now.  The patient will go to the ICU.  She is a full code.  Lyanne Co, MD 10/18/12 2056

## 2012-10-18 NOTE — Progress Notes (Addendum)
ANTIBIOTIC CONSULT NOTE - INITIAL  Pharmacy Consult for  vancomycin Indication: rule out sepsis  Allergies  Allergen Reactions  . Hyoscyamine Other (See Comments)    Per MAR  . Ivp Dye (Iodinated Diagnostic Agents) Other (See Comments)    Per MAR  . Septra (Sulfamethoxazole W-Trimethoprim) Other (See Comments)    Per MAR    Patient Measurements:   Adjusted Body Weight:   Vital Signs: Temp: 99 F (37.2 C) (07/11 1921) Temp src: Core (Comment) (07/11 1921) BP: 98/73 mmHg (07/11 1921) Pulse Rate: 109 (07/11 1921) Intake/Output from previous day:   Intake/Output from this shift:    Labs:  Recent Labs  10/18/12 1853  WBC 12.6*  HGB 12.0  PLT 294  CREATININE 0.78   The CrCl is unknown because both a height and weight (above a minimum accepted value) are required for this calculation. No results found for this basename: VANCOTROUGH, VANCOPEAK, VANCORANDOM, GENTTROUGH, GENTPEAK, GENTRANDOM, TOBRATROUGH, TOBRAPEAK, TOBRARND, AMIKACINPEAK, AMIKACINTROU, AMIKACIN,  in the last 72 hours   Microbiology: Recent Results (from the past 720 hour(s))  CULTURE, BLOOD (ROUTINE X 2)     Status: None   Collection Time    09/20/12  4:50 PM      Result Value Range Status   Specimen Description BLOOD LEFT ARM   Final   Special Requests BOTTLES DRAWN AEROBIC ONLY 10CC   Final   Culture  Setup Time 09/20/2012 22:28   Final   Culture NO GROWTH 5 DAYS   Final   Report Status 09/26/2012 FINAL   Final  CULTURE, BLOOD (ROUTINE X 2)     Status: None   Collection Time    09/20/12  4:50 PM      Result Value Range Status   Specimen Description BLOOD LEFT HAND   Final   Special Requests BOTTLES DRAWN AEROBIC ONLY 4CC   Final   Culture  Setup Time 09/20/2012 22:27   Final   Culture NO GROWTH 5 DAYS   Final   Report Status 09/26/2012 FINAL   Final    Medical History: Past Medical History  Diagnosis Date  . Hyperthyroidism     had RAI-is on thyroid replacement, happened maybe this summer   . Coronary artery disease ?, before 2005    Had a catheterization per son by Dr. [name] in 1991 no reports in  [place].  note from 2005 mentions hx of coronary stent  . GERD (gastroesophageal reflux disease)   . Stroke 07/2012    Right hemiplegia, dysphagia, aphasia.  . Dementia   . Aphasia 07/2012  . Chronic systolic heart failure     07/2012 the EF was 15%  . Mural thrombus of cardiac apex   . Obesity   . IBS (irritable bowel syndrome)   . DVT (deep venous thrombosis) before 2005    occurred after trauma, treated for a while with Coumadin.   Marland Kitchen Dysphagia due to recent stroke 07/2012    ok'd for purree/pudding thick liquids but G tube placed by IR  . OSA (obstructive sleep apnea)     refused CPAP per notes.     Medications:  Anti-infectives   Start     Dose/Rate Route Frequency Ordered Stop   10/19/12 0600  vancomycin (VANCOCIN) 1,250 mg in sodium chloride 0.9 % 250 mL IVPB     1,250 mg 166.7 mL/hr over 90 Minutes Intravenous Every 12 hours 10/18/12 1952     10/18/12 1645  piperacillin-tazobactam (ZOSYN) IVPB 3.375 g  3.375 g 12.5 mL/hr over 240 Minutes Intravenous  Once 10/18/12 1631 10/18/12 1909   10/18/12 1645  vancomycin (VANCOCIN) IVPB 1000 mg/200 mL premix     1,000 mg 200 mL/hr over 60 Minutes Intravenous  Once 10/18/12 1637       Assessment: 86 yof presented to the ED with hypotension. Hx of recent stroke. To start empiric antibiotics for sepsis with unclear source. She completed a course of vancomycin on 6/23 of CoNS bacteremia. She is currently afebrile and WBC is 12.6, Scr is WNL. She also received a 1x dose of zosyn in the ED.  Vanc 7/11>> Zosyn x 1 7/11  Goal of Therapy:  Vancomycin trough level 15-20 mcg/ml  Plan:  1. Vancomycin 1250mg  IV Q12H 2. F/u renal fxn, C&S, clinical status and trough at SS 3. Continue zosyn or other antibiotic??  Darby Fleeman, Drake Leach 10/18/2012,7:53 PM  Addendum: MD adding zosyn per pharmacy.   Plan: Zosyn 3.375gm  IV Q8H (4 hr inf)  Lysle Pearl, PharmD, BCPS Pager # [phone] 10/18/2012 9:13 PM

## 2012-10-18 NOTE — ED Notes (Signed)
Patient transported to CT 

## 2012-10-18 NOTE — ED Notes (Signed)
Per EMS patient transported from [facility]. Patient has hx of stroke x 1 month ago. Patient does not speak due to stroke and patient has g-tube and has to be I&O cath. Sats 89 on room air and sats 96 on 4 L oxygen. CBG 328 and is noted as new onset and ne onset of pitting edema in right hand and right arm. Patient has a hx of CHF and was given 250 ml NS.

## 2012-10-18 NOTE — Progress Notes (Signed)
VASCULAR LAB PRELIMINARY  PRELIMINARY  PRELIMINARY  PRELIMINARY  Right upper extremity venous duplex completed.    Preliminary report:  No obvious evidence of right upper extremity DVT or superficial thrombus. Technically difficult due to edema.  Hitoshi Werts, RVS 10/18/2012, 6:13 PM

## 2012-10-18 NOTE — ED Notes (Signed)
Pt's family updated on plan of care by Dr. Patria Mane.

## 2012-10-18 NOTE — ED Notes (Signed)
Per Rapid Response nurse start IV antibiotics and fluids and draw labs when patient returns from xray. 1000 ml NS and 3.375 g  Zosyn hung and infusining at this time.

## 2012-10-18 NOTE — ED Notes (Signed)
Patient has opened her eyes and attempted to speak and patient was told she is in the hospital and the patient calmed down.

## 2012-10-18 NOTE — ED Notes (Signed)
Nurse was not aware patient was being transported to vascular lab from xray. Waiting patients return for blood draw.

## 2012-10-19 ENCOUNTER — Inpatient Hospital Stay (HOSPITAL_COMMUNITY): Payer: Medicare Other

## 2012-10-19 ENCOUNTER — Encounter (HOSPITAL_COMMUNITY): Payer: Self-pay | Admitting: Internal Medicine

## 2012-10-19 DIAGNOSIS — I509 Heart failure, unspecified: Secondary | ICD-10-CM

## 2012-10-19 DIAGNOSIS — I5022 Chronic systolic (congestive) heart failure: Secondary | ICD-10-CM

## 2012-10-19 DIAGNOSIS — N390 Urinary tract infection, site not specified: Secondary | ICD-10-CM

## 2012-10-19 DIAGNOSIS — A419 Sepsis, unspecified organism: Secondary | ICD-10-CM

## 2012-10-19 DIAGNOSIS — D638 Anemia in other chronic diseases classified elsewhere: Secondary | ICD-10-CM

## 2012-10-19 DIAGNOSIS — R269 Unspecified abnormalities of gait and mobility: Secondary | ICD-10-CM

## 2012-10-19 DIAGNOSIS — I959 Hypotension, unspecified: Secondary | ICD-10-CM

## 2012-10-19 LAB — CBC
Hemoglobin: 9.5 g/dL — ABNORMAL LOW (ref 12.0–15.0)
RBC: 3.23 MIL/uL — ABNORMAL LOW (ref 3.87–5.11)

## 2012-10-19 LAB — GLUCOSE, CAPILLARY
Glucose-Capillary: 120 mg/dL — ABNORMAL HIGH (ref 70–99)
Glucose-Capillary: 110 mg/dL — ABNORMAL HIGH (ref 70–99)
Glucose-Capillary: 104 mg/dL — ABNORMAL HIGH (ref 70–99)
Glucose-Capillary: 113 mg/dL — ABNORMAL HIGH (ref 70–99)

## 2012-10-19 LAB — MAGNESIUM: Magnesium: 2 mg/dL (ref 1.5–2.5)

## 2012-10-19 LAB — BASIC METABOLIC PANEL
CO2: 29 mEq/L (ref 19–32)
Chloride: 110 mEq/L (ref 96–112)
GFR calc non Af Amer: 83 mL/min — ABNORMAL LOW (ref >90)
Glucose, Bld: 123 mg/dL — ABNORMAL HIGH (ref 70–99)
Potassium: 3.1 mEq/L — ABNORMAL LOW (ref 3.5–5.1)
Sodium: 145 mEq/L (ref 135–145)

## 2012-10-19 LAB — HEMOGLOBIN A1C: Hgb A1c MFr Bld: 6.2 % — ABNORMAL HIGH (ref <5.7)

## 2012-10-19 LAB — LACTIC ACID, PLASMA: Lactic Acid, Venous: 1.2 mmol/L (ref 0.5–2.2)

## 2012-10-19 MED ORDER — BIOTENE DRY MOUTH MT LIQD
15.0000 mL | Freq: Two times a day (BID) | OROMUCOSAL | Status: DC
  Administered 2012-10-19 – 2012-10-25 (×12): 15 mL via OROMUCOSAL

## 2012-10-19 MED ORDER — SODIUM CHLORIDE 0.9 % IV SOLN
1000.0000 mL | INTRAVENOUS | Status: DC
  Administered 2012-10-19: 1000 mL via INTRAVENOUS

## 2012-10-19 MED ORDER — ACETAMINOPHEN 500 MG PO TABS
500.0000 mg | ORAL_TABLET | Freq: Four times a day (QID) | ORAL | Status: DC | PRN
  Administered 2012-10-19: 500 mg via ORAL
  Filled 2012-10-19 (×2): qty 1

## 2012-10-19 MED ORDER — SODIUM CHLORIDE 0.9 % IV BOLUS (SEPSIS)
500.0000 mL | Freq: Once | INTRAVENOUS | Status: AC
  Administered 2012-10-19: 500 mL via INTRAVENOUS

## 2012-10-19 MED ORDER — PHENAZOPYRIDINE HCL 100 MG PO TABS
100.0000 mg | ORAL_TABLET | Freq: Three times a day (TID) | ORAL | Status: DC
  Administered 2012-10-19 – 2012-10-20 (×4): 100 mg via ORAL
  Filled 2012-10-19 (×5): qty 1

## 2012-10-19 MED ORDER — INSULIN ASPART 100 UNIT/ML ~~LOC~~ SOLN
0.0000 [IU] | SUBCUTANEOUS | Status: DC
  Administered 2012-10-19 (×3): 1 [IU] via SUBCUTANEOUS
  Administered 2012-10-20: 2 [IU] via SUBCUTANEOUS
  Administered 2012-10-20: 1 [IU] via SUBCUTANEOUS
  Administered 2012-10-20: 2 [IU] via SUBCUTANEOUS
  Administered 2012-10-20: 1 [IU] via SUBCUTANEOUS
  Administered 2012-10-20 (×2): 2 [IU] via SUBCUTANEOUS
  Administered 2012-10-21: 1 [IU] via SUBCUTANEOUS
  Administered 2012-10-21 – 2012-10-22 (×4): 2 [IU] via SUBCUTANEOUS
  Administered 2012-10-22: 1 [IU] via SUBCUTANEOUS
  Administered 2012-10-22: 2 [IU] via SUBCUTANEOUS
  Administered 2012-10-22 (×3): 1 [IU] via SUBCUTANEOUS
  Administered 2012-10-23 (×2): 2 [IU] via SUBCUTANEOUS
  Administered 2012-10-23: 1 [IU] via SUBCUTANEOUS
  Administered 2012-10-23 – 2012-10-24 (×4): 2 [IU] via SUBCUTANEOUS
  Administered 2012-10-24 (×2): 1 [IU] via SUBCUTANEOUS
  Administered 2012-10-24 (×3): 2 [IU] via SUBCUTANEOUS
  Administered 2012-10-25: 1 [IU] via SUBCUTANEOUS
  Administered 2012-10-25 (×3): 2 [IU] via SUBCUTANEOUS

## 2012-10-19 MED ORDER — JEVITY 1.2 CAL PO LIQD
1000.0000 mL | ORAL | Status: DC
  Administered 2012-10-19 – 2012-10-24 (×7): 1000 mL
  Filled 2012-10-19 (×12): qty 1000

## 2012-10-19 MED ORDER — BIOTENE DRY MOUTH MT LIQD: 15.0000 mL | Freq: Two times a day (BID) | OROMUCOSAL | Status: DC

## 2012-10-19 MED ORDER — LEVOTHYROXINE SODIUM 88 MCG PO TABS
88.0000 ug | ORAL_TABLET | Freq: Every day | ORAL | Status: DC
  Administered 2012-10-20 – 2012-10-25 (×6): 88 ug
  Filled 2012-10-19 (×8): qty 1

## 2012-10-19 MED ORDER — PHENAZOPYRIDINE HCL 100 MG PO TABS: 100.0000 mg | ORAL_TABLET | Freq: Three times a day (TID) | ORAL | Status: DC

## 2012-10-19 MED ORDER — ALBUMIN HUMAN 25 % IV SOLN
25.0000 g | Freq: Once | INTRAVENOUS | Status: AC
  Administered 2012-10-19: 25 g via INTRAVENOUS
  Filled 2012-10-19: qty 100

## 2012-10-19 MED ORDER — MEMANTINE HCL 5 MG PO TABS
5.0000 mg | ORAL_TABLET | Freq: Two times a day (BID) | ORAL | Status: DC
  Administered 2012-10-19 – 2012-10-25 (×13): 5 mg
  Filled 2012-10-19 (×15): qty 1

## 2012-10-19 MED ORDER — POTASSIUM CHLORIDE 20 MEQ/15ML (10%) PO LIQD
40.0000 meq | Freq: Once | ORAL | Status: AC
  Administered 2012-10-19: 40 meq
  Filled 2012-10-19: qty 30

## 2012-10-19 MED ORDER — PANTOPRAZOLE SODIUM 40 MG PO PACK
40.0000 mg | PACK | Freq: Every day | ORAL | Status: DC
  Administered 2012-10-19 – 2012-10-25 (×7): 40 mg
  Filled 2012-10-19 (×7): qty 20

## 2012-10-19 MED ORDER — PHENAZOPYRIDINE HCL 100 MG PO TABS
100.0000 mg | ORAL_TABLET | Freq: Three times a day (TID) | ORAL | Status: DC
  Filled 2012-10-19: qty 1

## 2012-10-19 NOTE — Progress Notes (Signed)
PULMONARY  / CRITICAL CARE MEDICINE  Name: Jenny Brady MRN: [id number] DOB: [birth year]    ADMISSION DATE:  10/18/2012 CONSULTATION DATE:  10/18/2012  REFERRING MD :  Dr. Patria Mane PRIMARY SERVICE: CCM  CHIEF COMPLAINT:  Hypotension  BRIEF PATIENT DESCRIPTION: 54 F NH pt PMH of CVA w/ aphasia, right sided hemiplegia admitted w/ hypotension and hypoxia -likely urosepsis. Recent admit last month with sepsis   SIGNIFICANT EVENTS / STUDIES:  7/11 CT head >Atrophy. Chronic left MCA and left PCA infarct. No acute abnormality.   LINES / TUBES: 1. PIV 2. Foley  CULTURES: 7/11 UC >> 7/11 BC x 2 >>  ANTIBIOTICS: 7/11 Vanc>> 7/11 Zosyn >>  Interval Hx /Subjective :  10/19/12: B/p improved with IVF/Albumin, no pressors needed Improved alertness, mental status returned to her baseline (aphasia/tracks, no following commands)     VITAL SIGNS: Temp:  [98.1 F (36.7 C)-99.5 F (37.5 C)] 98.1 F (36.7 C) (07/12 0700) Pulse Rate:  [66-117] 75 (07/12 0700) Resp:  [11-19] 12 (07/12 0700) BP: (71-156)/(29-118) 107/45 mmHg (07/12 0700) SpO2:  [86 %-100 %] 100 % (07/12 0700) Weight:  [164 lb 14.5 oz (74.8 kg)-168 lb 6.9 oz (76.4 kg)] 168 lb 6.9 oz (76.4 kg) (07/12 0456)   INTAKE / OUTPUT: Intake/Output     07/11 0701 - 07/12 0700 07/12 0701 - 07/13 0700   I.V. (mL/kg) 960.4 (12.6)    Other 20    IV Piggyback 1150    Total Intake(mL/kg) 2130.4 (27.9)    Urine (mL/kg/hr) 1065    Total Output 1065     Net +1065.4          Stool Occurrence 1 x      PHYSICAL EXAMINATION: General:  [name] in NAD Neuro:  Aphasic but tracks, does not follow simple commands  HEENT:  NCAT, sclera anicteric, conjunctiva pink, dry mucosa  NECK :  (-) JVD Cardiovascular:  RRR, NS1/S2, (-) MRG Lungs:  Difficult exam, (-) Obvious rales Abdomen:  S/ND/(+)BS, suprapubic tenderness (grimmaces)  PEG site C/D/I Musculoskeletal:  (-) C/C/E, dry, cracked skin on feet bilaterally with overgrown toenails Skin:   stg 2 decub 1x1 cm -sacral dressing (per RN)       CXR: 7/11 Mild left basilar atelectasis or scarring.  ASSESSMENT / PLAN:  PULMONARY  Recent Labs Lab 10/18/12 1644  PHART 7.576*  PCO2ART 28.9*  PO2ART 155.0*  HCO3 26.8*  TCO2 28  O2SAT 100.0    A:  1. Acute hypoxic respiratory failure: Unclear etiology.  10/19/12: Not in need of intubation or bipap. STable and improved P:   - wean O2 as tolerated - CXR in am  -cont empiric abx  -follow cx data   CARDIOVASCULAR   Recent Labs Lab 10/18/12 1856  TROPONINI <0.30   No results found for this basename: PROBNP,  in the last 168 hours  A:  1. Hypotension: with suspected SIRS/Urosepsis  7/11>Improving with fluid resusciatation x 4 L and albumin  2. Hx of CAD, Systolic CHF : does not appear fluid overloaded. (echo 09/2012 -EF 15%, diffuse hypokinesis)  3. Previous hx of ? Cardiac Thrombus on coumadin, stopped 09/2012 for GIB, repeat TEE w/ no evidence of thrombus. No anticoagulation recommended.    712/14: Not on pressors   P:  - Decrease IVF 50cc /h   - hold HTN rx for now ,monitor b/p closely -Hep for DVT proph.   RENAL   Recent Labs Lab 10/18/12 1853 10/19/12 0640  NA 139 145  K 3.6 3.1*  CL 102 110  CO2 26 29  GLUCOSE 222* 123*  BUN 55* 38*  CREATININE 0.78 0.57  CALCIUM 9.0 8.7  MG  --  2.0  PHOS  --  3.7  A: 1. AKI: Unclear etiology. ? ATN, Prerenal, etc. >improving w/ good UOP  And resolved renalfailuire 2. UTI  3. Hypokalemia    P:   - urine lytes, serial monitoring -replace K+   GASTROINTESTINAL  Recent Labs Lab 10/18/12 1853  AST 15  ALT 20  ALKPHOS 85  BILITOT 0.6  PROT 7.0  ALBUMIN 2.1*  INR 1.20    A:  Hx of Dysphagia secondary to CVA  -chronic peg   Plan  Begin TF  Restart PPI   HEMATOLOGIC  Recent Labs Lab 10/18/12 1853 10/19/12 0640  HGB 12.0 9.5*  HCT 37.2 29.5*  WBC 12.6* 9.8  PLT 294 206   A:  Anemia   Plan  -Tr cbc , transfuse for HGB <7    INFECTIOUS  Recent Labs Lab 10/18/12 1858 10/18/12 1916 10/18/12 2305 10/19/12 0644  LATICACIDVEN  --  2.83* 2.0 1.2  PROCALCITON 0.57  --  0.45 0.53    Recent Labs Lab 10/18/12 1853 10/19/12 0640  WBC 12.6* 9.8     A:  1. Occult sepsis: Meets 2/4 SIRS criteria (Tachycardia and WBC) and high lactiate. Urine likely source. L pneumonia also a possibility. Coverage with Vanc and Zosyn adequate for both. >PCT remains low w/ wbc tr down   7/12/14L Occult sepsis. Never needed pressors. Lactate normalized. No organ dysfunction    P:   - cont Vanc/Zosyn - follow-up cx data  - renal US to evaluate forblockage/hydronephrosis pending   ENDOCRINE CBG (last 3)   Recent Labs  10/19/12 0126 10/19/12 0415 10/19/12 0706  GLUCAP 129* 120* 110*     A:  1. DM   2. Hypothyroidism    P:   - SSI  -check tsh, restart synthroid   NEUROLOGIC Hx of CVA w/ right sided hemiplegia, aphasia and dementia. NH pt that PEG dependent.  7/11 CT head with chronic changes, no acute  Process noted.   Plan     1. Monitor  2. Restart Namenda via PEG   [facility] NP-C  Pulmonary and Critical Care Medicine [facility] Pager: [phone]  10/19/2012, 7:28 AM   STAFF NOTE: I, Dr Lavinia Sharps have personally reviewed patient's available data, including medical history, events of note, physical examination and test results as part of my evaluation. I have discussed with resident/NP and other care providers such as pharmacist, RN and RRT.  In addition,  I personally evaluated patient and elicited key findings of baseline aphasia with chronic systolic heart failure ejection fraction 15%. Admitted with hypotension due to occult sepsis without significant organ dysfunction. Source is urinary tract. With fluids her blood pressure normalized. She never needed vasopressors. Currently fluid going at 50 mL per hour. One has to be careful as to not over hydrate due to presence of chronic  systolic heart failure. She remains a full code. We will transfer her to step down unit under the care of triad hospitalist..  Rest per NP/medical resident whose note is outlined above and that I agree with     Dr. Kalman Shan, M.D., [facility].C.P Pulmonary and Critical Care Medicine Staff Physician [facility] [place] Pulmonary and Critical Care Pager: [phone], If no answer or between  15:00h - 7:00h: call [phone]  10/19/2012 11:16 AM

## 2012-10-19 NOTE — Progress Notes (Signed)
eLink Physician-Brief Progress Note Patient Name: Jenny Brady DOB: July 13, 1925 MRN: [id number]  Date of Service  10/19/2012   HPI/Events of Note  Pelvic pain in pt with uti  eICU Interventions  Ordered pyridium and tylenol   Intervention Category Intermediate Interventions: Pain - evaluation and management  GIDDINGS, OLIVIA K. 10/19/2012, 9:48 PM

## 2012-10-19 NOTE — Progress Notes (Signed)
INITIAL NUTRITION ASSESSMENT  DOCUMENTATION CODES Per approved criteria  -Not Applicable   INTERVENTION: 1. Initiate Jevity 1.2 @ 20 ml/hr via PEG and advance by 10 ml q 4 hrs as tolerated to a goal rate of 65 ml/hr continuous. This EN regimen will provide 1872 kcal, 87 gm protein, and 1259 ml free water.  2. IVF currently meeting hydration needs. If IVF d/c'd then will need free water flushes.    NUTRITION DIAGNOSIS: Inadequate oral intake related to inability to eat as evidenced by NPO diet status and PEG dependant at baseline.   Goal: EN to meet >/=90% estimated nutrition needs.   Monitor:  TF initiation and tolerance, weight trends, labs, I/O's  Reason for Assessment: Consult, initiation and management of enteral nutrition   79 y.o. female  Admitting Dx: Hypotension  ASSESSMENT: Pt brought by EMS from SNF with likely urosepsis. Pt with hx of CVA, aphasic, with PEG dependant for nutrition.   Pt home TF regimen is Jevity 1.5, 60 ml/hr over 20 hrs (6A to 2A per records from SNF). With 30 ml Pro-stat per tube once daily. Pt is NPO at facility. This is providing 1900 kcal, 92 gm protein, and 912 ml free water. Per RN, pt with some pain when flushing tube. KUB done and no acute findings.   RD will initiate tube feeding via G tube with slow advance and monitor for pain/intolerance.   Height: Ht Readings from Last 1 Encounters:  10/18/12 5\' 3"  (1.6 m)    Weight: Wt Readings from Last 1 Encounters:  10/19/12 168 lb 6.9 oz (76.4 kg)    Ideal Body Weight: 115 lbs   % Ideal Body Weight: 146%  Wt Readings from Last 10 Encounters:  10/19/12 168 lb 6.9 oz (76.4 kg)  09/24/12 176 lb 9.4 oz (80.1 kg)  09/24/12 176 lb 9.4 oz (80.1 kg)  08/15/12 164 lb (74.39 kg)  08/13/12 177 lb 4 oz (80.4 kg)  08/13/12 177 lb 4 oz (80.4 kg)  04/06/11 168 lb 10.4 oz (76.5 kg)  02/19/08 177 lb (80.287 kg)  09/28/07 175 lb (79.379 kg)    Usual Body Weight: 176 lbs recently  % Usual Body  Weight: 95%  BMI:  Body mass index is 29.84 kg/(m^2). Overweight   Estimated Nutritional Needs: Kcal: 1600-1800  Protein: 80-90 gm  Fluid: 1.6-1.8 L daily   Skin: stage 2 pressure ulcer on sacrum   Diet Order: NPO  EDUCATION NEEDS: -No education needs identified at this time   Intake/Output Summary (Last 24 hours) at 10/19/12 1112 Last data filed at 10/19/12 1100  Gross per 24 hour  Intake 2775.42 ml  Output   1340 ml  Net 1435.42 ml    Last BM: 7/11   Labs:   Recent Labs Lab 10/18/12 1853 10/19/12 0640  NA 139 145  K 3.6 3.1*  CL 102 110  CO2 26 29  BUN 55* 38*  CREATININE 0.78 0.57  CALCIUM 9.0 8.7  MG  --  2.0  PHOS  --  3.7  GLUCOSE 222* 123*    CBG (last 3)   Recent Labs  10/19/12 0126 10/19/12 0415 10/19/12 0706  GLUCAP 129* 120* 110*    Scheduled Meds: . antiseptic oral rinse  15 mL Mouth Rinse BID  . heparin subcutaneous  5,000 Units Subcutaneous Q8H  . insulin aspart  0-9 Units Subcutaneous Q4H  . [START ON 10/20/2012] levothyroxine  88 mcg Per Tube QAC breakfast  . memantine  5 mg Per Tube  BID  . pantoprazole sodium  40 mg Per Tube Q1200  . piperacillin-tazobactam (ZOSYN)  IV  3.375 g Intravenous Q8H  . vancomycin  1,250 mg Intravenous Q12H    Continuous Infusions: . sodium chloride 1,000 mL (10/19/12 2956)    Past Medical History  Diagnosis Date  . Hyperthyroidism     had RAI-is on thyroid replacement, happened maybe this summer  . Coronary artery disease ?, before 2005    Had a catheterization per son by Dr. [name] in 1991 no reports in  [place].  note from 2005 mentions hx of coronary stent  . GERD (gastroesophageal reflux disease)   . Stroke 07/2012    Right hemiplegia, dysphagia, aphasia.  . Dementia   . Aphasia 07/2012  . Chronic systolic heart failure     07/2012 the EF was 15%  . Mural thrombus of cardiac apex   . Obesity   . IBS (irritable bowel syndrome)   . DVT (deep venous thrombosis) before 2005     occurred after trauma, treated for a while with Coumadin.   Marland Kitchen Dysphagia due to recent stroke 07/2012    ok'd for purree/pudding thick liquids but G tube placed by IR  . OSA (obstructive sleep apnea)     refused CPAP per notes.     Past Surgical History  Procedure Laterality Date  . Tee without cardioversion N/A 07/26/2012    Procedure: TRANSESOPHAGEAL ECHOCARDIOGRAM (TEE);  Surgeon: Laurey Morale, MD;  Location: [facility] ENDOSCOPY;  Service: Cardiovascular;  Laterality: N/A;  . Gastric feeding tube  08/02/2012    placed in radiology    Clarene Duke RD, LDN Pager [phone] After Hours pager [phone]

## 2012-10-19 NOTE — H&P (Signed)
PULMONARY  / CRITICAL CARE MEDICINE  Name: Jenny Brady MRN: [id number] DOB: [birth year]    ADMISSION DATE:  10/18/2012 CONSULTATION DATE:  10/18/2012  REFERRING MD :  Dr. Patria Mane PRIMARY SERVICE: CCM  CHIEF COMPLAINT:  Hypotension  BRIEF PATIENT DESCRIPTION: 79 F with extensive PMH with likely urosepsis.  SIGNIFICANT EVENTS / STUDIES:  1. Dirty UA 10/18/2012  LINES / TUBES: 1. PIV 2. Foley  CULTURES: Urine cultures pending.  ANTIBIOTICS: Vanc/Zosyn 711-  HISTORY OF PRESENT ILLNESS:  Jenny Brady is a 79 yo F with an extensive PMH who presented to [name] tonight as a transfer for hypotension. The patient is aphasic and unable to provide any history. Two of her nieces are present who provide some background information but were not present for the acute events tonight. By report EMS found the patient hypoxic to 89% and hyperglycemic.   PAST MEDICAL HISTORY :  Past Medical History  Diagnosis Date  . Hyperthyroidism     had RAI-is on thyroid replacement, happened maybe this summer  . Coronary artery disease ?, before 2005    Had a catheterization per son by Dr. [name] in 1991 no reports in  [place].  note from 2005 mentions hx of coronary stent  . GERD (gastroesophageal reflux disease)   . Stroke 07/2012    Right hemiplegia, dysphagia, aphasia.  . Dementia   . Aphasia 07/2012  . Chronic systolic heart failure     07/2012 the EF was 15%  . Mural thrombus of cardiac apex   . Obesity   . IBS (irritable bowel syndrome)   . DVT (deep venous thrombosis) before 2005    occurred after trauma, treated for a while with Coumadin.   Marland Kitchen Dysphagia due to recent stroke 07/2012    ok'd for purree/pudding thick liquids but G tube placed by IR  . OSA (obstructive sleep apnea)     refused CPAP per notes.    Past Surgical History  Procedure Laterality Date  . Tee without cardioversion N/A 07/26/2012    Procedure: TRANSESOPHAGEAL ECHOCARDIOGRAM (TEE);  Surgeon: Laurey Morale, MD;   Location[facility] ENDOSCOPY;  Service: Cardiovascular;  Laterality: N/A;  . Gastric feeding tube  08/02/2012    placed in radiology   Prior to Admission medications   Medication Sig Start Date End Date Taking? Authorizing Provider  acetaminophen (TYLENOL) 325 MG tablet Place 650 mg into feeding tube every 4 (four) hours as needed for pain or fever.   Yes Historical Provider, MD  albuterol (PROVENTIL) (5 MG/ML) 0.5% nebulizer solution Take 0.5 mLs (2.5 mg total) by nebulization every 4 (four) hours as needed for wheezing or shortness of breath. 08/06/12  Yes Ripudeep K Rai, MD  cholecalciferol (VITAMIN D) 1000 UNITS tablet Place 2,000 Units into feeding tube daily. 08/06/12  Yes Ripudeep Jenna Luo, MD  cyanocobalamin 500 MCG tablet Place 500 mcg into feeding tube daily.   Yes Historical Provider, MD  furosemide (LASIX) 40 MG tablet Place 40 mg into feeding tube daily.   Yes Historical Provider, MD  heparin 5000 UNIT/ML injection Inject 5,000 Units into the skin every 8 (eight) hours.   Yes Historical Provider, MD  lansoprazole (PREVACID) 30 MG capsule Take 30 mg by mouth daily. Dissolve contents of 1 capsule in 10ml of water or juice   Yes Historical Provider, MD  levothyroxine (SYNTHROID, LEVOTHROID) 88 MCG tablet Place 88 mcg into feeding tube daily before breakfast.   Yes Historical Provider, MD  lisinopril (ZESTRIL) 2.5 MG tablet  Place 1 tablet (2.5 mg total) into feeding tube daily. 08/06/12  Yes Ripudeep Jenna Luo, MD  memantine (NAMENDA) 5 MG tablet Place 5 mg into feeding tube 2 (two) times daily.  04/06/11  Yes Kathlen Mody, MD  Nutritional Supplements (FEEDING SUPPLEMENT, JEVITY 1.5 CAL,) LIQD Place 1,000 mLs into feeding tube continuous. 82ml/hr; on at 6am off at 2am   Yes Historical Provider, MD  Pollen Extracts (PROSTAT PO) Place 30 mLs into feeding tube daily.   Yes Historical Provider, MD  polyethylene glycol (MIRALAX / GLYCOLAX) packet Take 17 g by mouth daily. 09/25/12  Yes Christiane Ha, MD   pravastatin (PRAVACHOL) 40 MG tablet Place 40 mg into feeding tube daily.   Yes Historical Provider, MD  Water For Irrigation, Sterile (FREE WATER) SOLN Place 150 mLs into feeding tube every 4 (four) hours. 08/06/12  Yes Ripudeep Jenna Luo, MD  docusate (COLACE) 50 MG/5ML liquid Place 10 mLs (100 mg total) into feeding tube 2 (two) times daily. 09/25/12   Christiane Ha, MD   Allergies  Allergen Reactions  . Hyoscyamine Other (See Comments)    Per MAR  . Ivp Dye (Iodinated Diagnostic Agents) Other (See Comments)    Per MAR  . Septra (Sulfamethoxazole W-Trimethoprim) Other (See Comments)    Per MAR    FAMILY HISTORY:  Family History  Problem Relation Age of Onset  . Acne Mother     died giving birth to 8th kid  . Dementia Father     died 105  . Acne Brother     died 2-3 yrs ago  . Cervical cancer Sister    SOCIAL HISTORY:  reports that she has never smoked. She has never used smokeless tobacco. She reports that she does not drink alcohol or use illicit drugs.  REVIEW OF SYSTEMS:  Unable to obtain secondary to mental status.  Physical Exam:   VITAL SIGNS: Temp:  [99 F (37.2 C)-99.5 F (37.5 C)] 99.5 F (37.5 C) (07/11 2115) Pulse Rate:  [96-117] 114 (07/11 2200) Resp:  [13-19] 16 (07/11 2115) BP: (74-156)/(35-118) 156/118 mmHg (07/11 2200) SpO2:  [98 %-100 %] 99 % (07/11 2115)   INTAKE / OUTPUT: Intake/Output     07/11 0701 - 07/12 0700   Urine 600   Total Output 600   Net -600         PHYSICAL EXAMINATION: General:  [name] in NAD Neuro:  Aphasic but tracks, occassionally responds to Y/N questions HEENT:  NCAT, sclera anicteric, conjunctiva pink, MMM, OP Clear, (-) JVD Cardiovascular:  RRR, NS1/S2, (-) MRG Lungs:  Difficult exam, (-) Obvious rales Abdomen:  S/ND/(+)BS, Mild diffuse TTP worst in suprapubic area, PEG site C/D/I Musculoskeletal:  (-) C/C/E, dry, cracked skin on feet bilaterally with overgrown toenails Skin:  1-2 cm decub documented by  RN  LABS:  Recent Labs Lab 10/18/12 1644 10/18/12 1853 10/18/12 1856 10/18/12 1858 10/18/12 1916 10/18/12 2305  HGB  --  12.0  --   --   --   --   WBC  --  12.6*  --   --   --   --   PLT  --  294  --   --   --   --   NA  --  139  --   --   --   --   K  --  3.6  --   --   --   --   CL  --  102  --   --   --   --  CO2  --  26  --   --   --   --   GLUCOSE  --  222*  --   --   --   --   BUN  --  55*  --   --   --   --   CREATININE  --  0.78  --   --   --   --   CALCIUM  --  9.0  --   --   --   --   AST  --  15  --   --   --   --   ALT  --  20  --   --   --   --   ALKPHOS  --  85  --   --   --   --   BILITOT  --  0.6  --   --   --   --   PROT  --  7.0  --   --   --   --   ALBUMIN  --  2.1*  --   --   --   --   INR  --  1.20  --   --   --   --   LATICACIDVEN  --   --   --   --  2.83* 2.0  TROPONINI  --   --  <0.30  --   --   --   PROCALCITON  --   --   --  0.57  --  0.45  PHART 7.576*  --   --   --   --   --   PCO2ART 28.9*  --   --   --   --   --   PO2ART 155.0*  --   --   --   --   --     Recent Labs Lab 10/18/12 2210  GLUCAP 139*    CXR: Chest X-ray 10/18/2012 was personally reviewed by me. There a possible density of the left lung abutting the diaphram.  ASSESSMENT / PLAN:  PULMONARY A:  1. Acute hypoxic respiratory failure: Unclear etiology.  P:   - wean O2 as tolerated - Monitor  CARDIOVASCULAR A:  1. Hypotension: No evidence of acute heart failure or fluid overload. S/p 3 L of IVF. ? if still underresussitated.   2. ICM: No evidence of acute ischemia/volume overload.  P:  - albumin challenge, if does not increase MAP to goal of 60-65 will consider central line placement and possible presssors  RENAL A: 1. AKI: Unclear etiology. ? ATN, Prerenal, etc. P:   - urine lytes, serial monitoring  GASTROINTESTINAL A:  No acute issues  HEMATOLOGIC A:  No acute issues  INFECTIOUS A:  1. Severe sepsis: Meets 2/4 SIRS criteria (Tachycardia and WBC). Urine  likely source. L pneumonia also a possibility. Coverage with Vanc and Zosyn adequate for both. P:   - cont Vanc/Zosyn - follow-up urine culture - blood cultures - renal US to evaluate for blockage/hydronephrosis  ENDOCRINE A:  1. DM   P:   - SSI - Check A1c  NEUROLOGIC A:   1. Remote CVA: No evidence of active issues.  The patient is critically ill with multiple organ systems failure and requires high complexity decision making for assessment and support, frequent evaluation and titration of therapies, application of advanced monitoring technologies and extensive interpretation of multiple databases. Critical Care Time devoted to patient care services described in this note is  30 minutes.    Pulmonary and Critical Care Medicine [facility] Pager: [phone]  10/19/2012, 12:11 AM

## 2012-10-20 ENCOUNTER — Inpatient Hospital Stay (HOSPITAL_COMMUNITY): Payer: Medicare Other

## 2012-10-20 DIAGNOSIS — J9600 Acute respiratory failure, unspecified whether with hypoxia or hypercapnia: Secondary | ICD-10-CM

## 2012-10-20 LAB — BASIC METABOLIC PANEL
Calcium: 8.9 mg/dL (ref 8.4–10.5)
GFR calc Af Amer: >90 mL/min (ref >90)
GFR calc non Af Amer: >90 mL/min (ref >90)
Glucose, Bld: 145 mg/dL — ABNORMAL HIGH (ref 70–99)
Sodium: 146 mEq/L — ABNORMAL HIGH (ref 135–145)

## 2012-10-20 LAB — GLUCOSE, CAPILLARY
Glucose-Capillary: 153 mg/dL — ABNORMAL HIGH (ref 70–99)
Glucose-Capillary: 154 mg/dL — ABNORMAL HIGH (ref 70–99)
Glucose-Capillary: 171 mg/dL — ABNORMAL HIGH (ref 70–99)
Glucose-Capillary: 138 mg/dL — ABNORMAL HIGH (ref 70–99)

## 2012-10-20 LAB — CBC
Hemoglobin: 8.9 g/dL — ABNORMAL LOW (ref 12.0–15.0)
MCH: 29.3 pg (ref 26.0–34.0)
MCHC: 32.1 g/dL (ref 30.0–36.0)
RDW: 20.2 % — ABNORMAL HIGH (ref 11.5–15.5)

## 2012-10-20 LAB — PROCALCITONIN: Procalcitonin: 0.43 ng/mL

## 2012-10-20 MED ORDER — SIMVASTATIN 20 MG PO TABS
20.0000 mg | ORAL_TABLET | Freq: Every day | ORAL | Status: DC
  Administered 2012-10-20 – 2012-10-25 (×6): 20 mg
  Filled 2012-10-20 (×6): qty 1

## 2012-10-20 MED ORDER — SODIUM CHLORIDE 0.9 % IV SOLN
1000.0000 mL | INTRAVENOUS | Status: DC
  Administered 2012-10-20: 1000 mL via INTRAVENOUS

## 2012-10-20 MED ORDER — ACETAMINOPHEN 160 MG/5ML PO SOLN
500.0000 mg | Freq: Four times a day (QID) | ORAL | Status: DC | PRN
  Administered 2012-10-21 – 2012-10-24 (×2): 500 mg
  Filled 2012-10-20 (×2): qty 20
  Filled 2012-10-20: qty 20.3
  Filled 2012-10-20 (×2): qty 20

## 2012-10-20 MED ORDER — PHENAZOPYRIDINE HCL 100 MG PO TABS: 100.0000 mg | ORAL_TABLET | Freq: Three times a day (TID) | ORAL | Status: DC

## 2012-10-20 MED ORDER — FREE WATER
200.0000 mL | Freq: Three times a day (TID) | Status: DC
  Administered 2012-10-20 – 2012-10-21 (×4): 200 mL

## 2012-10-20 MED ORDER — DOCUSATE SODIUM 50 MG/5ML PO LIQD
100.0000 mg | Freq: Two times a day (BID) | ORAL | Status: DC
  Administered 2012-10-21 – 2012-10-22 (×2): 100 mg
  Filled 2012-10-20 (×5): qty 10

## 2012-10-20 MED ORDER — POTASSIUM CHLORIDE 20 MEQ/15ML (10%) PO LIQD
20.0000 meq | Freq: Every day | ORAL | Status: DC
  Administered 2012-10-20 – 2012-10-21 (×2): 20 meq
  Filled 2012-10-20 (×3): qty 15

## 2012-10-20 MED ORDER — CYANOCOBALAMIN 500 MCG PO TABS
500.0000 ug | ORAL_TABLET | Freq: Every day | ORAL | Status: DC
  Administered 2012-10-21 – 2012-10-25 (×5): 500 ug
  Filled 2012-10-20 (×5): qty 1

## 2012-10-20 MED ORDER — ACETAMINOPHEN 500 MG PO TABS: 500.0000 mg | ORAL_TABLET | Freq: Four times a day (QID) | ORAL | Status: DC | PRN

## 2012-10-20 MED ORDER — ALBUTEROL SULFATE (5 MG/ML) 0.5% IN NEBU
2.5000 mg | INHALATION_SOLUTION | RESPIRATORY_TRACT | Status: DC | PRN
  Administered 2012-10-21 – 2012-10-22 (×2): 2.5 mg via RESPIRATORY_TRACT
  Filled 2012-10-20 (×2): qty 0.5

## 2012-10-20 NOTE — Progress Notes (Signed)
[facility] Progress Note [place] TEAM 1 - Stepdown/ICU TEAM   Jenny Brady NFA:[id number] DOB: [birth year] DOA: 10/18/2012 PCP: [name], MD  Brief narrative: 80 yo F with an extensive PMH who presented to [facility] 10/19/12 as a transfer for hypotension. The patient was aphasic and unable to provide any history. Two of her nieces were present and provided some background information but were not present for the acute event leading to her hospitalization. By report EMS found the patient hypoxic to 89% and hyperglycemic.   Admitted per PCCM on 7/12 with hypotension due to occult sepsis without significant organ dysfunction. Source was urinary tract. With fluids her blood pressure normalized. She never needed vasopressors. She remains a full code. Transfered to step down unit later in day 7/12 under the care of [facility] for 7/13.  Assessment/Plan:  Acute hypoxic resp failure Resolved - likely due to hypoventilation due to altered mental status due to sepsis   Hypotension / Severe sepsis  B/p improved with IVF/Albumin, no pressors needed - pt + only ~2.5L since admit   Gram neg rod pyelonephritis  Continue empiric abx tx until speciation and sensitivity available   CAD  Chronic systolic CHF EF 15% April 2014  Previous hx of ? Cardiac Thrombus on coumadin anticoag stopped 09/2012 for GIB - repeat echo w/ no evidence of thrombus so coumadin not resumed  DM Reasonably controlled at this time  Hx of CVA w/ R hemiplegia, dysphagia, and aphais - April 2014 Chronic PEG - TF ongoing at present   Dementia Reportedly when at her baseline is aphasic but tracks though doesn't follow commands  Hypothyroid s/p RAI for hyperthyroidism TSH is quite elevated - check free T4  Obesity  GERD  OSA  Code Status: FULL Family Communication: no family present at time of exam Disposition Plan: SDU  Consultants: PCCM >>  TRH  Procedures: none  Antibiotics: Vancomycin 7/11 >> Zosyn 7/11 >>  DVT prophylaxis: Subcutaneous heparin  HPI/Subjective: The patient opens her eyes but does not appear to be tracking the examiner.  She is aphasic at baseline.  There is no family present at time of the exam.   Objective: Blood pressure 115/47, pulse 82, temperature 98.5 F (36.9 C), temperature source Oral, resp. rate 18, height 5\' 3"  (1.6 m), weight 77.6 kg (171 lb 1.2 oz), SpO2 97.00%.  Intake/Output Summary (Last 24 hours) at 10/20/12 1624 Last data filed at 10/20/12 1540  Gross per 24 hour  Intake   1820 ml  Output    775 ml  Net   1045 ml     Exam: General: No acute respiratory distress evident to exam  Lungs: poor air movement in bilateral bases with no wheezes  Cardiovascular: distant heart sounds without appreciable gallop rub or murmur  Abdomen: obese, nondistended, soft, bowel sounds positive, no rebound, no ascites, no appreciable mass Extremities:  2+ edema bilateral lower extremities and right upper extremity   Data Reviewed: Basic Metabolic Panel:  Recent Labs Lab 10/18/12 1853 10/19/12 0640 10/20/12 0425  NA 139 145 146*  K 3.6 3.1* 3.2*  CL 102 110 112  CO2 26 29 26   GLUCOSE 222* 123* 145*  BUN 55* 38* 29*  CREATININE 0.78 0.57 0.44*  CALCIUM 9.0 8.7 8.9  MG  --  2.0  --   PHOS  --  3.7  --    Liver Function Tests:  Recent Labs Lab 10/18/12 1853  AST 15  ALT 20  ALKPHOS 85  BILITOT 0.6  PROT 7.0  ALBUMIN 2.1*    Recent Labs Lab 10/18/12 1857  AMMONIA 34   CBC:  Recent Labs Lab 10/18/12 1853 10/19/12 0640 10/20/12 0425  WBC 12.6* 9.8 7.4  NEUTROABS 10.1*  --   --   HGB 12.0 9.5* 8.9*  HCT 37.2 29.5* 27.7*  MCV 89.4 91.3 91.1  PLT 294 206 205   Cardiac Enzymes:  Recent Labs Lab 10/18/12 1856  TROPONINI <0.30   BNP (last 3 results)  Recent Labs  07/21/12 1635  PROBNP 9584.0*   CBG:  Recent Labs Lab 10/19/12 1941 10/19/12 2352  10/20/12 0405 10/20/12 0746 10/20/12 1151  GLUCAP 123* 132* 153* 154* 171*    Recent Results (from the past 240 hour(s))  CULTURE, BLOOD (ROUTINE X 2)     Status: None   Collection Time    10/18/12  6:44 PM      Result Value Range Status   Specimen Description BLOOD ARM RIGHT   Final   Special Requests BOTTLES DRAWN AEROBIC ONLY 5CC   Final   Culture  Setup Time 10/19/2012 04:01   Final   Culture     Final   Value:        BLOOD CULTURE RECEIVED NO GROWTH TO DATE CULTURE WILL BE HELD FOR 5 DAYS BEFORE ISSUING A FINAL NEGATIVE REPORT   Report Status PENDING   Incomplete  CULTURE, BLOOD (ROUTINE X 2)     Status: None   Collection Time    10/18/12  6:53 PM      Result Value Range Status   Specimen Description BLOOD HAND RIGHT   Final   Special Requests BOTTLES DRAWN AEROBIC ONLY 4CC   Final   Culture  Setup Time 10/19/2012 04:00   Final   Culture     Final   Value:        BLOOD CULTURE RECEIVED NO GROWTH TO DATE CULTURE WILL BE HELD FOR 5 DAYS BEFORE ISSUING A FINAL NEGATIVE REPORT   Report Status PENDING   Incomplete  URINE CULTURE     Status: None   Collection Time    10/18/12  7:26 PM      Result Value Range Status   Specimen Description URINE, CATHETERIZED   Final   Special Requests NONE   Final   Culture  Setup Time 10/19/2012 04:43   Final   Colony Count >=100,000 COLONIES/ML   Final   Culture GRAM NEGATIVE RODS   Final   Report Status PENDING   Incomplete  MRSA PCR SCREENING     Status: None   Collection Time    10/18/12 10:12 PM      Result Value Range Status   MRSA by PCR NEGATIVE  NEGATIVE Final   Comment:            The GeneXpert MRSA Assay (FDA     approved for NASAL specimens     only), is one component of a     comprehensive MRSA colonization     surveillance program. It is not     intended to diagnose MRSA     infection nor to guide or     monitor treatment for     MRSA infections.     Studies:  Recent x-ray studies have been reviewed in detail by  the Attending Physician  Scheduled Meds:  Scheduled Meds: . antiseptic oral rinse  15 mL Mouth Rinse BID  . heparin subcutaneous  5,000 Units Subcutaneous Q8H  .  insulin aspart  0-9 Units Subcutaneous Q4H  . levothyroxine  88 mcg Per Tube QAC breakfast  . memantine  5 mg Per Tube BID  . pantoprazole sodium  40 mg Per Tube Q1200  . phenazopyridine  100 mg Oral TID WC  . piperacillin-tazobactam (ZOSYN)  IV  3.375 g Intravenous Q8H  . vancomycin  1,250 mg Intravenous Q12H    Time spent on care of this patient: 35 mins   [facility] T  [facility] Office  [phone] Pager - Text Page per Loretha Stapler as per below:  On-Call/Text Page:      [email or web address]      password [email or web address]  If 7PM-7AM, please contact night-coverage [email or web address] Password [email or web address] 10/20/2012, 4:24 PM   LOS: 2 days

## 2012-10-21 ENCOUNTER — Inpatient Hospital Stay (HOSPITAL_COMMUNITY): Payer: Medicare Other

## 2012-10-21 LAB — RENAL FUNCTION PANEL
BUN: 18 mg/dL (ref 6–23)
Calcium: 8.6 mg/dL (ref 8.4–10.5)
Glucose, Bld: 217 mg/dL — ABNORMAL HIGH (ref 70–99)
Phosphorus: 3.3 mg/dL (ref 2.3–4.6)
Sodium: 144 mEq/L (ref 135–145)

## 2012-10-21 LAB — CBC
HCT: 29.3 % — ABNORMAL LOW (ref 36.0–46.0)
MCHC: 31.4 g/dL (ref 30.0–36.0)
MCV: 91.3 fL (ref 78.0–100.0)
RDW: 19.9 % — ABNORMAL HIGH (ref 11.5–15.5)

## 2012-10-21 LAB — GLUCOSE, CAPILLARY
Glucose-Capillary: 158 mg/dL — ABNORMAL HIGH (ref 70–99)
Glucose-Capillary: 187 mg/dL — ABNORMAL HIGH (ref 70–99)
Glucose-Capillary: 155 mg/dL — ABNORMAL HIGH (ref 70–99)
Glucose-Capillary: 159 mg/dL — ABNORMAL HIGH (ref 70–99)
Glucose-Capillary: 141 mg/dL — ABNORMAL HIGH (ref 70–99)
Glucose-Capillary: 112 mg/dL — ABNORMAL HIGH (ref 70–99)

## 2012-10-21 LAB — URINE CULTURE: Colony Count: >=100000

## 2012-10-21 LAB — VANCOMYCIN, TROUGH: Vancomycin Tr: 25.7 ug/mL (ref 10.0–20.0)

## 2012-10-21 MED ORDER — DEXTROSE 5 % IV SOLN
1.0000 g | INTRAVENOUS | Status: DC
  Administered 2012-10-21 – 2012-10-24 (×4): 1 g via INTRAVENOUS
  Filled 2012-10-21 (×5): qty 10

## 2012-10-21 MED ORDER — POTASSIUM CHLORIDE 20 MEQ/15ML (10%) PO LIQD
40.0000 meq | Freq: Two times a day (BID) | ORAL | Status: DC
  Administered 2012-10-21 – 2012-10-22 (×2): 40 meq
  Filled 2012-10-21 (×4): qty 30

## 2012-10-21 MED ORDER — FREE WATER
100.0000 mL | Freq: Three times a day (TID) | Status: DC
  Administered 2012-10-21 – 2012-10-25 (×12): 100 mL

## 2012-10-21 MED ORDER — VANCOMYCIN HCL 10 G IV SOLR
1250.0000 mg | INTRAVENOUS | Status: DC
  Filled 2012-10-21: qty 1250

## 2012-10-21 NOTE — Clinical Documentation Improvement (Signed)
THIS DOCUMENT IS NOT A PERMANENT PART OF THE MEDICAL RECORD  Please update your documentation with the medical record to reflect your response to this query. If you need help knowing how to do this please call [phone].  10/21/12  Lonia Blood, MD / Associates  In a better effort to capture your patient's severity of illness, reflect appropriate length of stay and utilization of resources, a review of the patient medical record has revealed the following indicators.    Based on your clinical judgment, please clarify and document in a progress note and/or discharge summary the clinical condition associated with the following supporting information:  In responding to this query please exercise your independent judgment.  The fact that a query is asked, does not imply that any particular answer is desired or expected.  Medicare rules require specification as to whether an inpatient diagnosis was present at the time of admission.   Per nursing assessment patient has a "stage 2 pressure ulcer on her sacrum".  Please clarify if the following diagnosis,          stage 2 pressure ulcer on her sacrum  was:      _ Present at the time of admission  _ NOT present at the time of inpatient admission and it developed during the inpatient stay  _ Unable to clinically determine whether the condition was present on admission.  _  Documentation insufficient to determine if condition was present at the time of inpatient admission  You may use possible, probable, or suspect with inpatient documentation. possible, probable, suspected diagnoses MUST be documented at the time of discharge  Reviewed:documented on prog nt 7/12/ 13  Thank Bonita Quin,  Lavonda Jumbo  Clinical Documentation Specialist: [phone] Health Information Management [place]

## 2012-10-21 NOTE — Progress Notes (Signed)
Utilization review completed.  

## 2012-10-21 NOTE — Progress Notes (Signed)
ANTIBIOTIC CONSULT NOTE - follow up Pharmacy Consult for  Vancomycin/zosyn Indication: rule out sepsis  Allergies  Allergen Reactions  . Hyoscyamine Other (See Comments)    Per MAR  . Ivp Dye (Iodinated Diagnostic Agents) Other (See Comments)    Per MAR  . Septra (Sulfamethoxazole W-Trimethoprim) Other (See Comments)    Per [facility]    Patient Measurements: Height: 5\' 3"  (160 cm) Weight: 166 lb 7.2 oz (75.5 kg) IBW/kg (Calculated) : 52.4 Adjusted Body Weight:   Vital Signs: Temp: 100.5 F (38.1 C) (07/14 0416) Temp src: Rectal (07/14 0416) BP: 132/67 mmHg (07/14 0416) Pulse Rate: 109 (07/14 0416) Intake/Output from previous day: 07/13 0701 - 07/14 0700 In: 2392.7 [I.V.:642.7; NG/GT:1700; IV Piggyback:50] Out: 800 [Urine:800] Intake/Output from this shift: Total I/O In: 1135 [I.V.:100; NG/GT:985; IV Piggyback:50] Out: 475 [Urine:475]  Labs:  Recent Labs  10/19/12 0640 10/20/12 0425 10/21/12 0500  WBC 9.8 7.4 8.2  HGB 9.5* 8.9* 9.2*  PLT 206 205 206  CREATININE 0.57 0.44* 0.41*   Estimated Creatinine Clearance: 50.9 ml/min (by C-G formula based on Cr of 0.41).  Recent Labs  10/21/12 0530  VANCOTROUGH 25.7*     Microbiology: Recent Results (from the past 720 hour(s))  CULTURE, BLOOD (ROUTINE X 2)     Status: None   Collection Time    10/18/12  6:44 PM      Result Value Range Status   Specimen Description BLOOD ARM RIGHT   Final   Special Requests BOTTLES DRAWN AEROBIC ONLY 5CC   Final   Culture  Setup Time 10/19/2012 04:01   Final   Culture     Final   Value:        BLOOD CULTURE RECEIVED NO GROWTH TO DATE CULTURE WILL BE HELD FOR 5 DAYS BEFORE ISSUING A FINAL NEGATIVE REPORT   Report Status PENDING   Incomplete  CULTURE, BLOOD (ROUTINE X 2)     Status: None   Collection Time    10/18/12  6:53 PM      Result Value Range Status   Specimen Description BLOOD HAND RIGHT   Final   Special Requests BOTTLES DRAWN AEROBIC ONLY 4CC   Final   Culture  Setup Time  10/19/2012 04:00   Final   Culture     Final   Value:        BLOOD CULTURE RECEIVED NO GROWTH TO DATE CULTURE WILL BE HELD FOR 5 DAYS BEFORE ISSUING A FINAL NEGATIVE REPORT   Report Status PENDING   Incomplete  URINE CULTURE     Status: None   Collection Time    10/18/12  7:26 PM      Result Value Range Status   Specimen Description URINE, CATHETERIZED   Final   Special Requests NONE   Final   Culture  Setup Time 10/19/2012 04:43   Final   Colony Count >=100,000 COLONIES/ML   Final   Culture GRAM NEGATIVE RODS   Final   Report Status PENDING   Incomplete  MRSA PCR SCREENING     Status: None   Collection Time    10/18/12 10:12 PM      Result Value Range Status   MRSA by PCR NEGATIVE  NEGATIVE Final   Comment:            The GeneXpert MRSA Assay (FDA     approved for NASAL specimens     only), is one component of a     comprehensive MRSA colonization  surveillance program. It is not     intended to diagnose MRSA     infection nor to guide or     monitor treatment for     MRSA infections.    Assessment: She is on day # 4 of empiric vancomycin and zosyn for sepsis.  Blood cx NGTD,  Urine cx > 100 K of GNR.  WBC 8.2. Creat 0.41.  Tmax 100.5.  Vancomycin trough = 25.7 mcg/ml on vancomycin 1250 mg IV q12hr.  Trough is higher than 15-20 mcg/ml goal.  RN took down dose after 10-15 minutes infused.    Vanc 7/11>> Zosyn x 1 7/11  7/11 BC x 2 >>NGTD 7/11 UCx = > 100 K GNR.   Goal of Therapy:  Vancomycin trough level 15-20 mcg/ml  Plan:  1. Decrease Vancomycin to 1250mg  IV Q24 - will probably be stopped 2nd GNR on urine cx 2. Continue Zosyn 3.375gm IV Q8H (4 hr inf) 3. F/u ID of GNR in urine cx and narrow abx tx. Herby Abraham, Pharm.D. [phone] 10/21/2012 6:27 AM

## 2012-10-21 NOTE — Progress Notes (Signed)
[facility] Progress Note [place] TEAM 1 - Stepdown/ICU TEAM   Sulay Brymer Bail OZH:[id number] DOB: June 06, 1925 DOA: 10/18/2012 PCP: [name], MD  Brief narrative: 79 yo F with an extensive PMH who presented to [facility] 10/19/12 as a transfer for hypotension. The patient was aphasic and unable to provide any history. Two of her nieces were present and provided some background information but were not present for the acute event leading to her hospitalization. By report EMS found the patient hypoxic to 89% and hyperglycemic.   Admitted per PCCM on 7/12 with hypotension due to occult sepsis without significant organ dysfunction. Source was urinary tract. With fluids her blood pressure normalized. She never needed vasopressors. She remains a full code. Transfered to step down unit later in day 7/12 under the care of [facility] for 7/13.  Assessment/Plan:  Acute hypoxic resp failure Resolved - likely due to hypoventilation due to altered mental status due to sepsis - pt appears to be approaching her baseline mental status, which apparently is quite limited   Hypotension / Severe sepsis  BP improved with IVF/Albumin, no pressors needed - pt + ~4L since admit   ENTEROBACTER CLOACAE pyelonephritis  Narrow abx to rocephin alone and follow clinically - blood cx negative thus far  CAD Appears to be quiescent at this time  Chronic systolic CHF EF 15% April 2014 - cont to watch Is/Os and daily weights   Previous hx of ?cardiac Thrombus on coumadin anticoag stopped 09/2012 for GIB - repeat echo w/ no evidence of thrombus so coumadin not resumed  DM Reasonably controlled at this time - no change in tx plan today  Hx of CVA w/ R hemiplegia, dysphagia, and aphais - April 2014 Chronic PEG - TF ongoing at present   Dementia Reportedly when at her baseline is aphasic but tracks though doesn't follow commands - is doing just that today, therefore suspect she is at her baseline    Hypothyroid s/p RAI for hyperthyroidism TSH is quite elevated, but free T4 is normal - no change in tx - will need repeat TSH in 6-8 weeks   Obesity  GERD   Code Status: FULL Family Communication: no family present at time of exam Disposition Plan: stable for transfer to med bed - will ultimately require return to SNF   Consultants: PCCM >> TRH  Procedures: none  Antibiotics: Vancomycin 7/11 >> 7/14 Zosyn 7/11 >> 7/14  DVT prophylaxis: Subcutaneous heparin  HPI/Subjective: Patient opens her eyes and now will track the examiner.  She is aphasic at baseline.  She does not follow simple commands, or interact in any meaningful fashion. There is no family present at time of the exam.  Objective: Blood pressure 102/67, pulse 92, temperature 98.1 F (36.7 C), temperature source Oral, resp. rate 20, height 5\' 3"  (1.6 m), weight 75.5 kg (166 lb 7.2 oz), SpO2 95.00%.  Intake/Output Summary (Last 24 hours) at 10/21/12 1616 Last data filed at 10/21/12 1547  Gross per 24 hour  Intake 2752.66 ml  Output   1225 ml  Net 1527.66 ml    Exam: General: No acute respiratory distress evident on exam  Lungs: poor air movement in bilateral bases with no wheezes  Cardiovascular: distant heart sounds without appreciable gallop rub or murmur  Abdomen: obese, nondistended, soft, bowel sounds positive, no rebound, no ascites, no appreciable mass - PEG tube insertion clean and dry (tucked up under L breast) Extremities:  2+ edema bilateral lower extremities and right upper extremity  Data Reviewed: Basic Metabolic Panel:  Recent Labs Lab 10/18/12 1853 10/19/12 0640 10/20/12 0425 10/21/12 0500  NA 139 145 146* 144  K 3.6 3.1* 3.2* 2.9*  CL 102 110 112 109  CO2 26 29 26 24   GLUCOSE 222* 123* 145* 217*  BUN 55* 38* 29* 18  CREATININE 0.78 0.57 0.44* 0.41*  CALCIUM 9.0 8.7 8.9 8.6  MG  --  2.0  --   --   PHOS  --  3.7  --  3.3   Liver Function Tests:  Recent Labs Lab  10/18/12 1853 10/21/12 0500  AST 15  --   ALT 20  --   ALKPHOS 85  --   BILITOT 0.6  --   PROT 7.0  --   ALBUMIN 2.1* 2.1*    Recent Labs Lab 10/18/12 1857  AMMONIA 34   CBC:  Recent Labs Lab 10/18/12 1853 10/19/12 0640 10/20/12 0425 10/21/12 0500  WBC 12.6* 9.8 7.4 8.2  NEUTROABS 10.1*  --   --   --   HGB 12.0 9.5* 8.9* 9.2*  HCT 37.2 29.5* 27.7* 29.3*  MCV 89.4 91.3 91.1 91.3  PLT 294 206 205 206   Cardiac Enzymes:  Recent Labs Lab 10/18/12 1856  TROPONINI <0.30   BNP (last 3 results)  Recent Labs  07/21/12 1635  PROBNP 9584.0*   CBG:  Recent Labs Lab 10/20/12 1658 10/20/12 2025 10/20/12 2324 10/21/12 0415 10/21/12 0722  GLUCAP 134* 138* 158* 187* 155*    Recent Results (from the past 240 hour(s))  CULTURE, BLOOD (ROUTINE X 2)     Status: None   Collection Time    10/18/12  6:44 PM      Result Value Range Status   Specimen Description BLOOD ARM RIGHT   Final   Special Requests BOTTLES DRAWN AEROBIC ONLY 5CC   Final   Culture  Setup Time 10/19/2012 04:01   Final   Culture     Final   Value:        BLOOD CULTURE RECEIVED NO GROWTH TO DATE CULTURE WILL BE HELD FOR 5 DAYS BEFORE ISSUING A FINAL NEGATIVE REPORT   Report Status PENDING   Incomplete  CULTURE, BLOOD (ROUTINE X 2)     Status: None   Collection Time    10/18/12  6:53 PM      Result Value Range Status   Specimen Description BLOOD HAND RIGHT   Final   Special Requests BOTTLES DRAWN AEROBIC ONLY 4CC   Final   Culture  Setup Time 10/19/2012 04:00   Final   Culture     Final   Value:        BLOOD CULTURE RECEIVED NO GROWTH TO DATE CULTURE WILL BE HELD FOR 5 DAYS BEFORE ISSUING A FINAL NEGATIVE REPORT   Report Status PENDING   Incomplete  URINE CULTURE     Status: None   Collection Time    10/18/12  7:26 PM      Result Value Range Status   Specimen Description URINE, CATHETERIZED   Final   Special Requests NONE   Final   Culture  Setup Time 10/19/2012 04:43   Final   Colony Count  >=100,000 COLONIES/ML   Final   Culture ENTEROBACTER CLOACAE   Final   Report Status 10/21/2012 FINAL   Final   Organism ID, Bacteria ENTEROBACTER CLOACAE   Final  MRSA PCR SCREENING     Status: None   Collection Time    10/18/12 10:12  PM      Result Value Range Status   MRSA by PCR NEGATIVE  NEGATIVE Final   Comment:            The GeneXpert MRSA Assay (FDA     approved for NASAL specimens     only), is one component of a     comprehensive MRSA colonization     surveillance program. It is not     intended to diagnose MRSA     infection nor to guide or     monitor treatment for     MRSA infections.     Studies:  Recent x-ray studies have been reviewed in detail by the Attending Physician  Scheduled Meds:  Scheduled Meds: . antiseptic oral rinse  15 mL Mouth Rinse BID  . cyanocobalamin  500 mcg Per Tube Daily  . docusate  100 mg Per Tube BID  . free water  200 mL Per Tube Q8H  . heparin subcutaneous  5,000 Units Subcutaneous Q8H  . insulin aspart  0-9 Units Subcutaneous Q4H  . levothyroxine  88 mcg Per Tube QAC breakfast  . memantine  5 mg Per Tube BID  . pantoprazole sodium  40 mg Per Tube Q1200  . piperacillin-tazobactam (ZOSYN)  IV  3.375 g Intravenous Q8H  . potassium chloride  20 mEq Per Tube Daily  . simvastatin  20 mg Per Tube q1800  . [START ON 10/22/2012] vancomycin  1,250 mg Intravenous Q24H    Time spent on care of this patient: 35 mins   [facility] T  [facility] Office  [phone] Pager - Text Page per Loretha Stapler as per below:  On-Call/Text Page:      [email or web address]      password [email or web address]  If 7PM-7AM, please contact night-coverage [email or web address] Password [email or web address] 10/21/2012, 4:16 PM   LOS: 3 days

## 2012-10-21 NOTE — Progress Notes (Signed)
Report called to receiving RN on 6E.  Pt transferred to 6E via bed with all belongings. Called pt's niece, Darrielle Pflieger, with new room number.    Roselie Awkward, RN

## 2012-10-21 NOTE — Progress Notes (Signed)
Pt has been having more frequent PVCs and one run of SVT. BP stable at this time. MD Reidler paged, no new orders.

## 2012-10-21 NOTE — Progress Notes (Signed)
CRITICAL VALUE ALERT  Critical value received:  vanc trough high    Date of notification:  10/21/2012  Time of notification:  0615  Critical value read back:yes  Nurse who received alert:  L Hitt RN   MD notified (1st page):  MD not aware, will pass on level and consulted with pharmD Marcelino Duster  Time of first page: no page  MD notified (2nd page):  Time of second page:  Responding MD:    Time MD responded:

## 2012-10-22 DIAGNOSIS — E0590 Thyrotoxicosis, unspecified without thyrotoxic crisis or storm: Secondary | ICD-10-CM

## 2012-10-22 LAB — GLUCOSE, CAPILLARY
Glucose-Capillary: 149 mg/dL — ABNORMAL HIGH (ref 70–99)
Glucose-Capillary: 150 mg/dL — ABNORMAL HIGH (ref 70–99)
Glucose-Capillary: 159 mg/dL — ABNORMAL HIGH (ref 70–99)
Glucose-Capillary: 180 mg/dL — ABNORMAL HIGH (ref 70–99)

## 2012-10-22 LAB — CBC
HCT: 31.7 % — ABNORMAL LOW (ref 36.0–46.0)
Hemoglobin: 10 g/dL — ABNORMAL LOW (ref 12.0–15.0)
MCH: 28.9 pg (ref 26.0–34.0)
RBC: 3.46 MIL/uL — ABNORMAL LOW (ref 3.87–5.11)

## 2012-10-22 LAB — BASIC METABOLIC PANEL
Chloride: 107 mEq/L (ref 96–112)
GFR calc non Af Amer: >90 mL/min (ref >90)
Glucose, Bld: 173 mg/dL — ABNORMAL HIGH (ref 70–99)
Potassium: 3.5 mEq/L (ref 3.5–5.1)
Sodium: 145 mEq/L (ref 135–145)

## 2012-10-22 MED ORDER — FUROSEMIDE 10 MG/ML IJ SOLN
40.0000 mg | Freq: Once | INTRAMUSCULAR | Status: AC
  Administered 2012-10-22: 40 mg via INTRAVENOUS
  Filled 2012-10-22: qty 4

## 2012-10-22 MED ORDER — FUROSEMIDE 10 MG/ML IJ SOLN
20.0000 mg | Freq: Two times a day (BID) | INTRAMUSCULAR | Status: DC
  Administered 2012-10-22 – 2012-10-23 (×3): 20 mg via INTRAVENOUS
  Filled 2012-10-22 (×6): qty 2

## 2012-10-22 MED ORDER — POTASSIUM CHLORIDE 20 MEQ/15ML (10%) PO LIQD
40.0000 meq | Freq: Three times a day (TID) | ORAL | Status: DC
  Administered 2012-10-22 – 2012-10-23 (×5): 40 meq
  Filled 2012-10-22 (×8): qty 30

## 2012-10-22 NOTE — Progress Notes (Signed)
Event: Notified by RN that pt somewhat more tachypneac than usual. Denies overt distress but does report some mild expiratory wheezes. 02 sats remain WNL and pt more tachycardic than usual. RN encouraged to give prn Albuterol neb and repage if increased WOB persist. I was repaged and informed that though expiratory wheezes have resolved pt continues to have slight increased WOB. Pt's niece and (HPOA) is at bedside and reports that she was not breathing like this when she last saw her. RR RN has been called and is at bedside. PCXR has been ordered. NP to bedside. Subjective: Pt is aphasic and unable to provide information. Objective: Ms. Woodhead is an 79 y/o female with an extensive PMH who presented to [facility] 10/19/12 as a transfer from [facility] for hypotension. The patient was aphasic and unable to provide any history. Two of her nieces were present and provided some background information but were not present for the acute event leading to her hospitalization. By report EMS found the patient hypoxic to 89% and hyperglycemic. Admitted per PCCM on 7/12 with hypotension due to occult sepsis without significant organ dysfunction. Source was urinary tract. With fluids her blood pressure normalized. She never needed vasopressors. She remains a full code. Transfered to step down unit later in day 7/12 under the care of [facility] since 7/13. At bedside pt is noted w/ mildly increased WOB w/o overt respiratory distress. BBS CTA. She is alert and able to track. Pt is afebrile. BP-129/80, P-116, R-30 with 02 sats of 95% on R/A. Pt is noted to be mouth breathing which niece felt is new. PCXR reveals vascular congestion and mild cardiomegaly; persistent left basilar airspace opacity raises concern for mild pneumonia. Mild asymmetric interstitial edema might have a similar appearance. Initially covered w/ Vanc and Zosyn, pt currently on Rocephin. Niece (who is also a physician) is concerned that pt has not  been getting Lasix (d/t hypotension). Questions if pt could get Lasix now that BP has improved. At bedside for 20-30 min. Pt noted w/ respirations that have returned to normal and appears to be resting comfortably with eyes closed.  Assessment/Plan: 1. SOB: Expiratory wheezes resolved w/ albuterol neb. Etiology unclear. Sats normal. May have an anxiety component. Not unreasonable to give a one time dose of IV lasix given CXR findings and h/o chronic CHF (EF 15%).  2. Tachycardia: Present intermittently since admission. Felt d/t sepsis. Review of previous EKG's (as far back as 2012) reveal chronic sinus tachycardia as well. Will defer further changes in pt's plan of care to rounding MD in AM. Will continue to monitor closely.  Leanne Chang, NP-C [facility] Pager [phone]

## 2012-10-22 NOTE — Progress Notes (Signed)
NUTRITION FOLLOW UP  Intervention:   1. Continue current TF regimen.   NUTRITION DIAGNOSIS:  Inadequate oral intake related to inability to eat as evidenced by NPO diet status and PEG dependant at baseline. Ongoing  Goal:  EN to meet >/=90% estimated nutrition needs. Met  Monitor:  TF tolerance, weight trends, labs, I/O's  Assessment:   Tube feeding is running at goal rate, RN reports pt is tolerating well.  Current TF is Jevity 1.2 @ 65 ml/hr, 1872 kcal, 87 gm protein, and 1259 ml free water and 100 ml free water flush Q 8 hrs. Total free water 1559 ml per day.    Height: Ht Readings from Last 1 Encounters:  10/18/12 5\' 3"  (1.6 m)    Weight Status:   Wt Readings from Last 1 Encounters:  10/21/12 165 lb 9.1 oz (75.1 kg)    Re-estimated needs:  Kcal: 1600-1800  Protein: 80-90 gm  Fluid: 1.6-1.8 L daily   Skin: stage 2 pressure ulcer on sacrum   Diet Order: NPO   Intake/Output Summary (Last 24 hours) at 10/22/12 0917 Last data filed at 10/22/12 1610  Gross per 24 hour  Intake 1469.08 ml  Output   2200 ml  Net -730.92 ml    Last BM: 7/14   Labs:   Recent Labs Lab 10/19/12 0640 10/20/12 0425 10/21/12 0500 10/22/12 0630  NA 145 146* 144 145  K 3.1* 3.2* 2.9* 3.5  CL 110 112 109 107  CO2 29 26 24 28   BUN 38* 29* 18 15  CREATININE 0.57 0.44* 0.41* 0.43*  CALCIUM 8.7 8.9 8.6 8.9  MG 2.0  --   --   --   PHOS 3.7  --  3.3  --   GLUCOSE 123* 145* 217* 173*    CBG (last 3)   Recent Labs  10/21/12 2359 10/22/12 0408 10/22/12 0744  GLUCAP 149* 180* 135*    Scheduled Meds: . antiseptic oral rinse  15 mL Mouth Rinse BID  . cefTRIAXone (ROCEPHIN)  IV  1 g Intravenous Q24H  . cyanocobalamin  500 mcg Per Tube Daily  . docusate  100 mg Per Tube BID  . free water  100 mL Per Tube Q8H  . heparin subcutaneous  5,000 Units Subcutaneous Q8H  . insulin aspart  0-9 Units Subcutaneous Q4H  . levothyroxine  88 mcg Per Tube QAC breakfast  . memantine  5 mg  Per Tube BID  . pantoprazole sodium  40 mg Per Tube Q1200  . potassium chloride  40 mEq Per Tube BID  . simvastatin  20 mg Per Tube q1800    Continuous Infusions: . sodium chloride 1,000 mL (10/21/12 1800)  . feeding supplement (JEVITY 1.2 CAL) 1,000 mL (10/21/12 1800)    Clarene Duke RD, LDN Pager [phone] After Hours pager [phone]

## 2012-10-22 NOTE — Progress Notes (Signed)
[facility] Progress Note   Jenny Brady ZOX:[id number] DOB: 11-13-1925 DOA: 10/18/2012 PCP: [name], MD  Brief narrative: 79 yo F with an extensive PMH who presented to [facility] 10/19/12 as a transfer for hypotension. The patient was aphasic and unable to provide any history. Two of her nieces were present and provided some background information but were not present for the acute event leading to her hospitalization. By report EMS found the patient hypoxic to 89% and hyperglycemic.   Admitted per PCCM on 7/12 with hypotension due to occult sepsis without significant organ dysfunction. Source was urinary tract. With fluids her blood pressure normalized. She never needed vasopressors. She remains a full code. Transfered to step down unit later in day 7/12 under the care of [facility] for 7/13. Transferred out to med-surg on 7/15. Patient overall improving but with increased SOB/tachypnea. Given hx of CHF with EF 15%, elevated JVD on exam and the fact she is 4L positive since admission; SOB is due to volume overload.   Assessment/Plan:  Acute hypoxic resp failure -likely due to hypoventilation due to altered mental status due to sepsis  -and now due to iatrogenic exacerbation of her CHF given fluid resuscitation -will start gentle diuresis  Hypotension / Severe sepsis  BP improved with IVF/Albumin, no pressors needed - pt + ~4L since admit  -will continue watching I's and O's -restart lasix  ENTEROBACTER CLOACAE pyelonephritis  -Narrow abx to rocephin alone and follow clinically  -blood cx negative thus far  CAD -Appears to be stable -no CP  Chronic systolic CHF with mild exacerbation EF 15% April 2014  -cont to watch Is/Os and daily weights  -will start gentle diuresis -plan is to resume daily per tube lasix in 1-2 days  Previous hx of ?cardiac Thrombus on coumadin anticoag stopped 09/2012 secondary to GIB - repeat echo w/ no evidence of thrombus so  coumadin not resumed  DM Reasonably controlled at this time - no change in tx plan today  Hx of CVA w/ R hemiplegia, dysphagia, and aphais - April 2014 Chronic PEG - TF ongoing at present  No new deficit  Dementia Reportedly when at her baseline is aphasic but tracks though doesn't follow commands or able to talk -Patient is doing that currently, therefore suspect she is at her baseline   Hypothyroid s/p RAI for hyperthyroidism TSH is quite elevated, but free T4 is normal - no change in tx - will need repeat TSH in 6-8 weeks   Hypokalemia: -will replete and continue daily maintenance  Code Status: FULL Family Communication: Niece at bedside Disposition Plan: SNF when medically stable  Consultants: PCCM   Procedures: none  Antibiotics: Vancomycin 7/11 >> 7/14 Zosyn 7/11 >> 7/14  DVT prophylaxis: Subcutaneous heparin  HPI/Subjective: Patient opens her eyes and track examiner.  She is aphasic at baseline.  She does not follow simple commands. Afebrile. Positive JVD and tachypnea on exam  Objective: Blood pressure 98/56, pulse 97, temperature 99 F (37.2 C), temperature source Oral, resp. rate 22, height 5\' 3"  (1.6 m), weight 75.1 kg (165 lb 9.1 oz), SpO2 94.00%.  Intake/Output Summary (Last 24 hours) at 10/22/12 1414 Last data filed at 10/22/12 0900  Gross per 24 hour  Intake 1094.08 ml  Output   2075 ml  Net -980.92 ml    Exam: General: afebrile, tachypnea on exam present Lungs: tachypnea, fine crackles and decreased air movement at bases  Cardiovascular: distant heart sounds without appreciable gallop rub or murmur; positive  JVD  Abdomen: obese, nondistended, soft, bowel sounds positive, no rebound, no ascites, no appreciable mass - PEG tube insertion clean and dry (tucked up under L breast) Extremities:  2+ edema bilateral lower extremities and right upper extremity   Data Reviewed: Basic Metabolic Panel:  Recent Labs Lab 10/18/12 1853 10/19/12 0640  10/20/12 0425 10/21/12 0500 10/22/12 0630  NA 139 145 146* 144 145  K 3.6 3.1* 3.2* 2.9* 3.5  CL 102 110 112 109 107  CO2 26 29 26 24 28   GLUCOSE 222* 123* 145* 217* 173*  BUN 55* 38* 29* 18 15  CREATININE 0.78 0.57 0.44* 0.41* 0.43*  CALCIUM 9.0 8.7 8.9 8.6 8.9  MG  --  2.0  --   --   --   PHOS  --  3.7  --  3.3  --    Liver Function Tests:  Recent Labs Lab 10/18/12 1853 10/21/12 0500  AST 15  --   ALT 20  --   ALKPHOS 85  --   BILITOT 0.6  --   PROT 7.0  --   ALBUMIN 2.1* 2.1*    Recent Labs Lab 10/18/12 1857  AMMONIA 34   CBC:  Recent Labs Lab 10/18/12 1853 10/19/12 0640 10/20/12 0425 10/21/12 0500 10/22/12 0630  WBC 12.6* 9.8 7.4 8.2 6.6  NEUTROABS 10.1*  --   --   --   --   HGB 12.0 9.5* 8.9* 9.2* 10.0*  HCT 37.2 29.5* 27.7* 29.3* 31.7*  MCV 89.4 91.3 91.1 91.3 91.6  PLT 294 206 205 206 225   Cardiac Enzymes:  Recent Labs Lab 10/18/12 1856  TROPONINI <0.30   BNP (last 3 results)  Recent Labs  07/21/12 1635  PROBNP 9584.0*   CBG:  Recent Labs Lab 10/21/12 1552 10/21/12 2006 10/21/12 2359 10/22/12 0408 10/22/12 0744  GLUCAP 141* 112* 149* 180* 135*    Recent Results (from the past 240 hour(s))  CULTURE, BLOOD (ROUTINE X 2)     Status: None   Collection Time    10/18/12  6:44 PM      Result Value Range Status   Specimen Description BLOOD ARM RIGHT   Final   Special Requests BOTTLES DRAWN AEROBIC ONLY 5CC   Final   Culture  Setup Time 10/19/2012 04:01   Final   Culture     Final   Value:        BLOOD CULTURE RECEIVED NO GROWTH TO DATE CULTURE WILL BE HELD FOR 5 DAYS BEFORE ISSUING A FINAL NEGATIVE REPORT   Report Status PENDING   Incomplete  CULTURE, BLOOD (ROUTINE X 2)     Status: None   Collection Time    10/18/12  6:53 PM      Result Value Range Status   Specimen Description BLOOD HAND RIGHT   Final   Special Requests BOTTLES DRAWN AEROBIC ONLY 4CC   Final   Culture  Setup Time 10/19/2012 04:00   Final   Culture      Final   Value:        BLOOD CULTURE RECEIVED NO GROWTH TO DATE CULTURE WILL BE HELD FOR 5 DAYS BEFORE ISSUING A FINAL NEGATIVE REPORT   Report Status PENDING   Incomplete  URINE CULTURE     Status: None   Collection Time    10/18/12  7:26 PM      Result Value Range Status   Specimen Description URINE, CATHETERIZED   Final   Special Requests NONE  Final   Culture  Setup Time 10/19/2012 04:43   Final   Colony Count >=100,000 COLONIES/ML   Final   Culture ENTEROBACTER CLOACAE   Final   Report Status 10/21/2012 FINAL   Final   Organism ID, Bacteria ENTEROBACTER CLOACAE   Final  MRSA PCR SCREENING     Status: None   Collection Time    10/18/12 10:12 PM      Result Value Range Status   MRSA by PCR NEGATIVE  NEGATIVE Final   Comment:            The GeneXpert MRSA Assay (FDA     approved for NASAL specimens     only), is one component of a     comprehensive MRSA colonization     surveillance program. It is not     intended to diagnose MRSA     infection nor to guide or     monitor treatment for     MRSA infections.  CLOSTRIDIUM DIFFICILE BY PCR     Status: None   Collection Time    10/21/12  3:05 PM      Result Value Range Status   C difficile by pcr NEGATIVE  NEGATIVE Final     Studies:  Recent x-ray studies have been reviewed in detail by the Attending Physician  Scheduled Meds:  Scheduled Meds: . antiseptic oral rinse  15 mL Mouth Rinse BID  . cefTRIAXone (ROCEPHIN)  IV  1 g Intravenous Q24H  . cyanocobalamin  500 mcg Per Tube Daily  . free water  100 mL Per Tube Q8H  . furosemide  20 mg Intravenous Q12H  . heparin subcutaneous  5,000 Units Subcutaneous Q8H  . insulin aspart  0-9 Units Subcutaneous Q4H  . levothyroxine  88 mcg Per Tube QAC breakfast  . memantine  5 mg Per Tube BID  . pantoprazole sodium  40 mg Per Tube Q1200  . potassium chloride  40 mEq Per Tube TID  . simvastatin  20 mg Per Tube q1800    Time spent on care of this patient: 35  mins   Skyy Mcknight [phone]  [facility] Office  [phone] Pager - Text Page per Loretha Stapler as per below:  On-Call/Text Page:      [email or web address]      password [email or web address]  If 7PM-7AM, please contact night-coverage [email or web address] Password [email or web address] 10/22/2012, 2:14 PM   LOS: 4 days

## 2012-10-23 DIAGNOSIS — R918 Other nonspecific abnormal finding of lung field: Secondary | ICD-10-CM

## 2012-10-23 DIAGNOSIS — F411 Generalized anxiety disorder: Secondary | ICD-10-CM

## 2012-10-23 LAB — BASIC METABOLIC PANEL
CO2: 29 mEq/L (ref 19–32)
Calcium: 9.4 mg/dL (ref 8.4–10.5)
Creatinine, Ser: 0.39 mg/dL — ABNORMAL LOW (ref 0.50–1.10)
GFR calc non Af Amer: >90 mL/min (ref >90)
Glucose, Bld: 155 mg/dL — ABNORMAL HIGH (ref 70–99)
Sodium: 142 mEq/L (ref 135–145)

## 2012-10-23 LAB — GLUCOSE, CAPILLARY
Glucose-Capillary: 158 mg/dL — ABNORMAL HIGH (ref 70–99)
Glucose-Capillary: 161 mg/dL — ABNORMAL HIGH (ref 70–99)

## 2012-10-23 NOTE — Progress Notes (Addendum)
[facility] PROGRESS NOTE  Jenny Brady AVW:[id number] DOB: [birth year] DOA: 10/18/2012 PCP: [name], MD  Assessment/Plan  Brief narrative: 79 yo F with an extensive PMH who presented to [facility]/19/12 as a transfer for hypotension. The patient was aphasic and unable to provide any history. Two of her nieces were present and provided some background information but were not present for the acute event leading to her hospitalization. By report EMS found the patient hypoxic to 89% and hyperglycemic.   Admitted per PCCM on 7/12 with hypotension due to occult sepsis without significant organ dysfunction. Source was urinary tract. With fluids her blood pressure normalized. She never needed vasopressors. She remains a full code. Transfered to step down unit later in day 7/12 under the care of [facility] for 7/13. Transferred out to med-surg on 7/15. Patient overall improving but with increased SOB/tachypnea. Given hx of CHF with EF 15%, elevated JVD on exam and the fact she is 4L positive since admission; SOB is due to volume overload.  Assessment/Plan:  Acute hypoxic resp failure, likely due to hypoventilation due to altered mental status due to sepsis and acute on chronic CHF exacerbation -  Still on nasal canula this morning, but improving -  Wean nasal cannula as tolerated -  Continue gentle diuresis as below  Chronic systolic CHF with acute exacerbation, EF 15% April 2014  - Strict Is/Os and daily weights  -  Weight is down 1 kg and she is -2.4 L since yesterday - Continue Lasix IV twice a day at current dose - Blood pressure is low normal but stable and creatinine has improved with diuresis  Hypotension / Severe sepsis  BP improved with IVF/Albumin, no pressors needed - pt + ~4L since admit  -will continue watching I's and O's -restart lasix   ENTEROBACTER CLOACAE pyelonephritis sensitive to third-generation cephalosporin -Continue ceftriaxone and follow  clinically -blood cx negative thus far  CAD,  -Appears to be stable  Previous hx of ?cardiac Thrombus on coumadin anticoag stopped 09/2012 secondary to GIB - repeat echo w/ no evidence of thrombus so coumadin not resumed  DM, Reasonably controlled at this time - no change in tx plan today  Hx of CVA w/ R hemiplegia, dysphagia, and aphais - April 2014 Chronic PEG - TF ongoing at present  No new deficit  Dementia, at baseline. aphasic but tracks though doesn't follow commands or able to talk  Hypothyroid s/p RAI for hyperthyroidism TSH is quite elevated, but free T4 is normal - no change in tx - will need repeat TSH in 6-8 weeks   Hypokalemia: -will replete and continue daily maintenance  Code Status: FULL Family Communication:  Attended to call family members, however the son's home phone was wrong number and his mobile phone went to message (which was not in [place]). Disposition Plan: SNF when medically stable  Consultants: PCCM   Procedures: none  Antibiotics: Vancomycin 7/11 >> 7/14 Zosyn 7/11 >> 7/14 Ceftriaxone 7/14   DVT prophylaxis: Subcutaneous heparin  HPI/Subjective:  Aphasic.  Objective: Filed Vitals:   10/22/12 2000 10/23/12 0435 10/23/12 0949 10/23/12 1357  BP: 103/66 106/57 103/59 99/55  Pulse: 92 93 95 90  Temp: 98.8 F (37.1 C) 98.6 F (37 C) 98.6 F (37 C) 98.6 F (37 C)  TempSrc: Axillary Axillary Oral Oral  Resp: 20 18 18 20   Height:      Weight: 73.9 kg (162 lb 14.7 oz)     SpO2: 97% 99% 98% 98%  Intake/Output Summary (Last 24 hours) at 10/23/12 1535 Last data filed at 10/23/12 1100  Gross per 24 hour  Intake    670 ml  Output   2350 ml  Net  -1680 ml   Filed Weights   10/21/12 0500 10/21/12 2010 10/22/12 2000  Weight: 75.5 kg (166 lb 7.2 oz) 75.1 kg (165 lb 9.1 oz) 73.9 kg (162 lb 14.7 oz)    Exam:   General:  [name], awake and alert,  No acute distress  HEENT:  NCAT, MMM, left side of face is normal, right  sided facial droop  Cardiovascular:  RRR, nl S1, S2 no mrg, 2+ pulses, warm extremities  Respiratory:  CTAB, no increased WOB  Abdomen:  NABS, soft, mild tenderness to palpation around her PEG tube site without rebound or guarding. No surrounding erythema or induration or significant discharge around her PEG-tube.  MSK: Decreased tone and bulk right upper and lower extremities, 1+ pitting edema of the right upper extremity and right lower extremity.  Neuro:  Right hemiparesis. Aphasic.  Data Reviewed: Basic Metabolic Panel:  Recent Labs Lab 10/19/12 0640 10/20/12 0425 10/21/12 0500 10/22/12 0630 10/23/12 0540  NA 145 146* 144 145 142  K 3.1* 3.2* 2.9* 3.5 4.7  CL 110 112 109 107 106  CO2 29 26 24 28 29   GLUCOSE 123* 145* 217* 173* 155*  BUN 38* 29* 18 15 15   CREATININE 0.57 0.44* 0.41* 0.43* 0.39*  CALCIUM 8.7 8.9 8.6 8.9 9.4  MG 2.0  --   --   --  2.0  PHOS 3.7  --  3.3  --   --    Liver Function Tests:  Recent Labs Lab 10/18/12 1853 10/21/12 0500  AST 15  --   ALT 20  --   ALKPHOS 85  --   BILITOT 0.6  --   PROT 7.0  --   ALBUMIN 2.1* 2.1*   No results found for this basename: LIPASE, AMYLASE,  in the last 168 hours  Recent Labs Lab 10/18/12 1857  AMMONIA 34   CBC:  Recent Labs Lab 10/18/12 1853 10/19/12 0640 10/20/12 0425 10/21/12 0500 10/22/12 0630  WBC 12.6* 9.8 7.4 8.2 6.6  NEUTROABS 10.1*  --   --   --   --   HGB 12.0 9.5* 8.9* 9.2* 10.0*  HCT 37.2 29.5* 27.7* 29.3* 31.7*  MCV 89.4 91.3 91.1 91.3 91.6  PLT 294 206 205 206 225   Cardiac Enzymes:  Recent Labs Lab 10/18/12 1856  TROPONINI <0.30   BNP (last 3 results)  Recent Labs  07/21/12 1635  PROBNP 9584.0*   CBG:  Recent Labs Lab 10/22/12 2007 10/23/12 0003 10/23/12 0412 10/23/12 0739 10/23/12 1136  GLUCAP 150* 162* 156* 158* 161*    Recent Results (from the past 240 hour(s))  CULTURE, BLOOD (ROUTINE X 2)     Status: None   Collection Time    10/18/12  6:44 PM       Result Value Range Status   Specimen Description BLOOD ARM RIGHT   Final   Special Requests BOTTLES DRAWN AEROBIC ONLY 5CC   Final   Culture  Setup Time 10/19/2012 04:01   Final   Culture     Final   Value:        BLOOD CULTURE RECEIVED NO GROWTH TO DATE CULTURE WILL BE HELD FOR 5 DAYS BEFORE ISSUING A FINAL NEGATIVE REPORT   Report Status PENDING   Incomplete  CULTURE, BLOOD (ROUTINE X  2)     Status: None   Collection Time    10/18/12  6:53 PM      Result Value Range Status   Specimen Description BLOOD HAND RIGHT   Final   Special Requests BOTTLES DRAWN AEROBIC ONLY 4CC   Final   Culture  Setup Time 10/19/2012 04:00   Final   Culture     Final   Value:        BLOOD CULTURE RECEIVED NO GROWTH TO DATE CULTURE WILL BE HELD FOR 5 DAYS BEFORE ISSUING A FINAL NEGATIVE REPORT   Report Status PENDING   Incomplete  URINE CULTURE     Status: None   Collection Time    10/18/12  7:26 PM      Result Value Range Status   Specimen Description URINE, CATHETERIZED   Final   Special Requests NONE   Final   Culture  Setup Time 10/19/2012 04:43   Final   Colony Count >=100,000 COLONIES/ML   Final   Culture ENTEROBACTER CLOACAE   Final   Report Status 10/21/2012 FINAL   Final   Organism ID, Bacteria ENTEROBACTER CLOACAE   Final  MRSA PCR SCREENING     Status: None   Collection Time    10/18/12 10:12 PM      Result Value Range Status   MRSA by PCR NEGATIVE  NEGATIVE Final   Comment:            The GeneXpert MRSA Assay (FDA     approved for NASAL specimens     only), is one component of a     comprehensive MRSA colonization     surveillance program. It is not     intended to diagnose MRSA     infection nor to guide or     monitor treatment for     MRSA infections.  CLOSTRIDIUM DIFFICILE BY PCR     Status: None   Collection Time    10/21/12  3:05 PM      Result Value Range Status   C difficile by pcr NEGATIVE  NEGATIVE Final     Studies: Dg Chest Port 1 View  10/22/2012    *RADIOLOGY REPORT*  Clinical Data: Worsening shortness of breath; tachypnea and congestion.  PORTABLE CHEST - 1 VIEW  Comparison: Chest radiograph performed 10/20/2012  Findings: The lungs are well-aerated.  Left basilar airspace opacity raises concern for mild pneumonia.  Vascular congestion and peribronchial thickening are seen.  There is no definite evidence of pleural effusion or pneumothorax.  The cardiomediastinal silhouette is mildly enlarged.  No acute osseous abnormalities are seen.  IMPRESSION: Vascular congestion and mild cardiomegaly; left basilar airspace opacity raises concern for mild pneumonia.  Mild asymmetric interstitial edema might have a similar appearance.   Original Report Authenticated By: Tonia Ghent, M.D.    Scheduled Meds: . antiseptic oral rinse  15 mL Mouth Rinse BID  . cefTRIAXone (ROCEPHIN)  IV  1 g Intravenous Q24H  . cyanocobalamin  500 mcg Per Tube Daily  . free water  100 mL Per Tube Q8H  . furosemide  20 mg Intravenous BID  . heparin subcutaneous  5,000 Units Subcutaneous Q8H  . insulin aspart  0-9 Units Subcutaneous Q4H  . levothyroxine  88 mcg Per Tube QAC breakfast  . memantine  5 mg Per Tube BID  . pantoprazole sodium  40 mg Per Tube Q1200  . potassium chloride  40 mEq Per Tube TID  . simvastatin  20 mg  Per Tube q1800   Continuous Infusions: . sodium chloride 1,000 mL (10/21/12 1800)  . feeding supplement (JEVITY 1.2 CAL) 1,000 mL (10/23/12 1610)    Active Problems:   * No active hospital problems. *    Time spent: 30 min    Sharlee Rufino, [facility]  [facility] Pager [phone]. If 7PM-7AM, please contact night-coverage at [email or web address], password [email or web address] 10/23/2012, 3:35 PM  LOS: 5 days

## 2012-10-24 ENCOUNTER — Inpatient Hospital Stay (HOSPITAL_COMMUNITY): Payer: Medicare Other

## 2012-10-24 DIAGNOSIS — I2510 Atherosclerotic heart disease of native coronary artery without angina pectoris: Secondary | ICD-10-CM

## 2012-10-24 DIAGNOSIS — R1310 Dysphagia, unspecified: Secondary | ICD-10-CM

## 2012-10-24 LAB — CBC
Hemoglobin: 12.2 g/dL (ref 12.0–15.0)
MCH: 29.9 pg (ref 26.0–34.0)
Platelets: ADEQUATE 10*3/uL (ref 150–400)
RBC: 4.08 MIL/uL (ref 3.87–5.11)
WBC: 8.2 10*3/uL (ref 4.0–10.5)

## 2012-10-24 LAB — GLUCOSE, CAPILLARY
Glucose-Capillary: 130 mg/dL — ABNORMAL HIGH (ref 70–99)
Glucose-Capillary: 132 mg/dL — ABNORMAL HIGH (ref 70–99)
Glucose-Capillary: 151 mg/dL — ABNORMAL HIGH (ref 70–99)
Glucose-Capillary: 155 mg/dL — ABNORMAL HIGH (ref 70–99)
Glucose-Capillary: 155 mg/dL — ABNORMAL HIGH (ref 70–99)
Glucose-Capillary: 158 mg/dL — ABNORMAL HIGH (ref 70–99)

## 2012-10-24 LAB — BASIC METABOLIC PANEL
CO2: 24 mEq/L (ref 19–32)
Chloride: 100 mEq/L (ref 96–112)
Glucose, Bld: 138 mg/dL — ABNORMAL HIGH (ref 70–99)
Potassium: 5.5 mEq/L — ABNORMAL HIGH (ref 3.5–5.1)
Sodium: 134 mEq/L — ABNORMAL LOW (ref 135–145)

## 2012-10-24 MED ORDER — FUROSEMIDE 10 MG/ML IJ SOLN
40.0000 mg | Freq: Two times a day (BID) | INTRAMUSCULAR | Status: DC
  Administered 2012-10-24 – 2012-10-25 (×3): 40 mg via INTRAVENOUS
  Filled 2012-10-24 (×5): qty 4

## 2012-10-24 NOTE — Evaluation (Signed)
Physical Therapy Evaluation Patient Details Name: Jenny Brady MRN: [id number] DOB: 1926-03-05 Today's Date: 10/24/2012 Time: 4098-1191 PT Time Calculation (min): 10 min  PT Assessment / Plan / Recommendation History of Present Illness  Pt adm from SNF with hypotension and found to have UTI and sepsis. Pt s/p devastating Lt CVA 07/2012 with inability to make progress with PT at that time due to inability to follow commands, dementia, and aphasia   Clinical Impression  Patient evaluated by Physical Therapy with no further acute PT needs identified. Pt was followed by PT in 07/2012 with no significant progress made due to aphasia, dementia, and devastating CVA. Pt will continue to need 24 hour total care on d/c. PT is signing off.     PT Assessment  Patent does not need any further PT services    Follow Up Recommendations  No PT follow up;Other (comment) (will need 24 hour care due to total assist for needs)                Equipment Recommendations  None recommended by PT               Precautions / Restrictions Precautions Precautions: Fall   Pertinent Vitals/Pain Pt unable to indicate location of pain; grimacing when reaching endrange with ROM of Lt elbow, neck and Lt knee      Mobility  Bed Mobility Details for Bed Mobility Assistance: total assist with pt unable to participate with verbal, visual, or tactile cues               PT Goals(Current goals can be found in the care plan section) Acute Rehab PT Goals PT Goal Formulation: No goals set, d/c therapy  Visit Information  Last PT Received On: 10/24/12 Assistance Needed: +2 History of Present Illness: Pt adm from SNF with hypotension and found to have UTI and sepsis. Pt s/p devastating Lt CVA 07/2012 with inability to make progress with PT at that time due to inability to follow commands, dementia, and aphasia       Prior Functioning  Home Living Family/patient expects to be discharged to:: Skilled nursing  facility Prior Function Level of Independence: Needs assistance Gait / Transfers Assistance Needed: total assist/dependent for all mobility Communication Communication: Other (comment) (pt with no attempts to communicate)    Cognition  Cognition Arousal/Alertness: Awake/alert Behavior During Therapy: Flat affect Overall Cognitive Status: History of cognitive impairments - at baseline    Extremity/Trunk Assessment Upper Extremity Assessment Upper Extremity Assessment: RUE deficits/detail;LUE deficits/detail RUE Deficits / Details: edematous despite elevation on pilllow; flaccid  LUE Deficits / Details: incr tone with limited ROM (elbow extension ~-20); does move spontaneously against gravity Lower Extremity Assessment Lower Extremity Assessment: RLE deficits/detail;LLE deficits/detail RLE Deficits / Details: PROM WFL; no AROM; ?flexor tone at hip/knee; heel inspected with skin intact and no redness with heels floated LLE Deficits / Details: resists movement with groaning; PROM ankle to neutral; knee flexion to 30 only; heel inspected with no redness and heel floated Cervical / Trunk Assessment Cervical / Trunk Assessment: Other exceptions Cervical / Trunk Exceptions: pt maintains head turned to her Lt; unable to rotate past midline due to stiffness with pt grimacing and groaning   Balance    End of Session PT - End of Session Activity Tolerance: Treatment limited secondary to medical complications (Comment) Patient left: in bed;with call bell/phone within reach  GP     Gwenda Heiner 10/24/2012, 10:07 AM Pager [phone]

## 2012-10-24 NOTE — Progress Notes (Signed)
[facility] PROGRESS NOTE  Jenny Brady ZOX:[id number] DOB: 13-Nov-1925 DOA: 10/18/2012 PCP: [name], MD  Assessment/Plan  Brief narrative: 79 yo F with an extensive PMH who presented to [facility] 10/19/12 as a transfer for hypotension. The patient was aphasic and unable to provide any history. Two of her nieces were present and provided some background information but were not present for the acute event leading to her hospitalization. By report EMS found the patient hypoxic to 89% and hyperglycemic.   Admitted per PCCM on 7/12 with hypotension due to occult sepsis without significant organ dysfunction. Source was urinary tract. With fluids her blood pressure normalized. She never needed vasopressors. She remains a full code. Transfered to step down unit later in day 7/12 under the care of [facility] for 7/13. Transferred out to med-surg on 7/15. Patient overall improving but with increased SOB/tachypnea. Given hx of CHF with EF 15%, elevated JVD on exam and the fact she is 4L positive since admission; SOB is due to volume overload.  Assessment/Plan:  Acute hypoxic resp failure, likely due to acute on chronic CHF exacerbation -  Wean nasal cannula as tolerated -  Continue gentle diuresis as below  Chronic systolic CHF with acute exacerbation, EF 15% April 2014  -  Strict Is/Os and daily weights - Weight not recorded and is volume now up 1.7L since yesterday - Increase Lasix to 40mg  IV BID.   - Blood pressure is low normal but stable and creatinine stable with diuresis  Hypotension / Severe sepsis due to pyelonephritis, resolved.  BP improved with IVF/Albumin, no pressors needed.    ENTEROBACTER CLOACAE pyelonephritis sensitive to third-generation cephalosporin.   -Continue ceftriaxone, day 7/10 -blood cx negative thus far  CAD, appears to be stable  Hx of cardiac thrombus previously on coumadin which has been held since 09/2012 due to GIB.  Repeat ECHO without  evidence of thrombus.    DM, Reasonably controlled at this time  - continue LD SSI  Hx of CVA w/ R hemiplegia, dysphagia, and aphais - April 2014 Chronic PEG - TF ongoing at present  No new deficit  Dementia, at baseline. aphasic but tracks though doesn't follow commands or able to talk  Hypothyroid s/p RAI for hyperthyroidism - TSH is quite elevated but in the setting of septic shock -  Continue current levothyroxine dose  - Repeat TSH in 4-6 weeks   Hyperkalemia, likely due to potassium supplementation without the anticipated diuresis yesterday. -  D/c standing potassium and increase diuretics as above  Code Status: FULL Family Communication:  Spoke with family alone Disposition Plan: SNF when medically stable  Consultants: PCCM   Procedures: none  Antibiotics: Vancomycin 7/11 >> 7/14 Zosyn 7/11 >> 7/14 Ceftriaxone 7/14   DVT prophylaxis: Subcutaneous heparin  HPI/Subjective:  Aphasic.    Objective: Filed Vitals:   10/23/12 1357 10/23/12 1739 10/23/12 2026 10/24/12 0500  BP: 99/55 112/63 101/71 103/62  Pulse: 90 94 98 92  Temp: 98.6 F (37 C) 98.8 F (37.1 C) 98.9 F (37.2 C) 98.6 F (37 C)  TempSrc: Axillary Axillary Oral Axillary  Resp: 20 22 22 20   Height:   5\' 3"  (1.6 m)   Weight:   71.9 kg (158 lb 8.2 oz)   SpO2: 98% 96% 98% 95%    Intake/Output Summary (Last 24 hours) at 10/24/12 0707 Last data filed at 10/24/12 0612  Gross per 24 hour  Intake 2346.92 ml  Output   1350 ml  Net 996.92 ml  Filed Weights   10/21/12 2010 10/22/12 2000 10/23/12 2026  Weight: 75.1 kg (165 lb 9.1 oz) 73.9 kg (162 lb 14.7 oz) 71.9 kg (158 lb 8.2 oz)    Exam:   General:  [name], awake and alert,  No acute distress  HEENT:  NCAT, MMM, left side of face is normal, right sided facial droop  Cardiovascular:  RRR, nl S1, S2 no mrg, 2+ pulses, warm extremities  Respiratory:  CTAB, no increased WOB  Abdomen:  NABS, soft, ND/ND.   MSK: Decreased  tone and bulk right upper and lower extremities, 1+ pitting edema of the right upper extremity and right lower extremity.  Neuro:  Right hemiparesis. Aphasic.  Data Reviewed: Basic Metabolic Panel:  Recent Labs Lab 10/19/12 0640 10/20/12 0425 10/21/12 0500 10/22/12 0630 10/23/12 0540 10/24/12 0600  NA 145 146* 144 145 142 134*  K 3.1* 3.2* 2.9* 3.5 4.7 5.5*  CL 110 112 109 107 106 100  CO2 29 26 24 28 29 24   GLUCOSE 123* 145* 217* 173* 155* 138*  BUN 38* 29* 18 15 15 17   CREATININE 0.57 0.44* 0.41* 0.43* 0.39* 0.42*  CALCIUM 8.7 8.9 8.6 8.9 9.4 9.6  MG 2.0  --   --   --  2.0  --   PHOS 3.7  --  3.3  --   --   --    Liver Function Tests:  Recent Labs Lab 10/18/12 1853 10/21/12 0500  AST 15  --   ALT 20  --   ALKPHOS 85  --   BILITOT 0.6  --   PROT 7.0  --   ALBUMIN 2.1* 2.1*   No results found for this basename: LIPASE, AMYLASE,  in the last 168 hours  Recent Labs Lab 10/18/12 1857  AMMONIA 34   CBC:  Recent Labs Lab 10/18/12 1853 10/19/12 0640 10/20/12 0425 10/21/12 0500 10/22/12 0630  WBC 12.6* 9.8 7.4 8.2 6.6  NEUTROABS 10.1*  --   --   --   --   HGB 12.0 9.5* 8.9* 9.2* 10.0*  HCT 37.2 29.5* 27.7* 29.3* 31.7*  MCV 89.4 91.3 91.1 91.3 91.6  PLT 294 206 205 206 225   Cardiac Enzymes:  Recent Labs Lab 10/18/12 1856  TROPONINI <0.30   BNP (last 3 results)  Recent Labs  07/21/12 1635  PROBNP 9584.0*   CBG:  Recent Labs Lab 10/23/12 1136 10/23/12 1648 10/23/12 2008 10/24/12 0008 10/24/12 0428  GLUCAP 161* 135* 155* 151* 155*    Recent Results (from the past 240 hour(s))  CULTURE, BLOOD (ROUTINE X 2)     Status: None   Collection Time    10/18/12  6:44 PM      Result Value Range Status   Specimen Description BLOOD ARM RIGHT   Final   Special Requests BOTTLES DRAWN AEROBIC ONLY 5CC   Final   Culture  Setup Time 10/19/2012 04:01   Final   Culture     Final   Value:        BLOOD CULTURE RECEIVED NO GROWTH TO DATE CULTURE WILL BE  HELD FOR 5 DAYS BEFORE ISSUING A FINAL NEGATIVE REPORT   Report Status PENDING   Incomplete  CULTURE, BLOOD (ROUTINE X 2)     Status: None   Collection Time    10/18/12  6:53 PM      Result Value Range Status   Specimen Description BLOOD HAND RIGHT   Final   Special Requests BOTTLES DRAWN AEROBIC ONLY  4CC   Final   Culture  Setup Time 10/19/2012 04:00   Final   Culture     Final   Value:        BLOOD CULTURE RECEIVED NO GROWTH TO DATE CULTURE WILL BE HELD FOR 5 DAYS BEFORE ISSUING A FINAL NEGATIVE REPORT   Report Status PENDING   Incomplete  URINE CULTURE     Status: None   Collection Time    10/18/12  7:26 PM      Result Value Range Status   Specimen Description URINE, CATHETERIZED   Final   Special Requests NONE   Final   Culture  Setup Time 10/19/2012 04:43   Final   Colony Count >=100,000 COLONIES/ML   Final   Culture ENTEROBACTER CLOACAE   Final   Report Status 10/21/2012 FINAL   Final   Organism ID, Bacteria ENTEROBACTER CLOACAE   Final  MRSA PCR SCREENING     Status: None   Collection Time    10/18/12 10:12 PM      Result Value Range Status   MRSA by PCR NEGATIVE  NEGATIVE Final   Comment:            The GeneXpert MRSA Assay (FDA     approved for NASAL specimens     only), is one component of a     comprehensive MRSA colonization     surveillance program. It is not     intended to diagnose MRSA     infection nor to guide or     monitor treatment for     MRSA infections.  CLOSTRIDIUM DIFFICILE BY PCR     Status: None   Collection Time    10/21/12  3:05 PM      Result Value Range Status   C difficile by pcr NEGATIVE  NEGATIVE Final     Studies: No results found.  Scheduled Meds: . antiseptic oral rinse  15 mL Mouth Rinse BID  . cefTRIAXone (ROCEPHIN)  IV  1 g Intravenous Q24H  . cyanocobalamin  500 mcg Per Tube Daily  . free water  100 mL Per Tube Q8H  . furosemide  40 mg Intravenous BID  . heparin subcutaneous  5,000 Units Subcutaneous Q8H  . insulin  aspart  0-9 Units Subcutaneous Q4H  . levothyroxine  88 mcg Per Tube QAC breakfast  . memantine  5 mg Per Tube BID  . pantoprazole sodium  40 mg Per Tube Q1200  . simvastatin  20 mg Per Tube q1800   Continuous Infusions: . sodium chloride 1,000 mL (10/21/12 1800)  . feeding supplement (JEVITY 1.2 CAL) 1,000 mL (10/24/12 0433)    Active Problems:   * No active hospital problems. *    Time spent: 30 min    Mitsuru Dault, [facility]  [facility] Pager [phone]. If 7PM-7AM, please contact night-coverage at [email or web address], password [email or web address] 10/24/2012, 7:07 AM  LOS: 6 days

## 2012-10-25 DIAGNOSIS — I5023 Acute on chronic systolic (congestive) heart failure: Secondary | ICD-10-CM

## 2012-10-25 LAB — CBC
Hemoglobin: 11.7 g/dL — ABNORMAL LOW (ref 12.0–15.0)
MCHC: 33.4 g/dL (ref 30.0–36.0)
Platelets: 255 10*3/uL (ref 150–400)

## 2012-10-25 LAB — CULTURE, BLOOD (ROUTINE X 2)
Culture: NO GROWTH
Culture: NO GROWTH

## 2012-10-25 LAB — BASIC METABOLIC PANEL
Calcium: 9.5 mg/dL (ref 8.4–10.5)
GFR calc Af Amer: >90 mL/min (ref >90)
GFR calc non Af Amer: >90 mL/min (ref >90)
Glucose, Bld: 180 mg/dL — ABNORMAL HIGH (ref 70–99)
Potassium: 3.9 mEq/L (ref 3.5–5.1)
Sodium: 134 mEq/L — ABNORMAL LOW (ref 135–145)

## 2012-10-25 LAB — GLUCOSE, CAPILLARY
Glucose-Capillary: 157 mg/dL — ABNORMAL HIGH (ref 70–99)
Glucose-Capillary: 162 mg/dL — ABNORMAL HIGH (ref 70–99)

## 2012-10-25 MED ORDER — JEVITY 1.5 CAL PO LIQD: 1000.0000 mL | ORAL | Status: DC

## 2012-10-25 MED ORDER — FUROSEMIDE 40 MG PO TABS: 40.0000 mg | ORAL_TABLET | Freq: Every day | ORAL | Status: DC

## 2012-10-25 MED ORDER — CIPROFLOXACIN HCL 500 MG PO TABS
500.0000 mg | ORAL_TABLET | Freq: Two times a day (BID) | ORAL | Status: DC
  Administered 2012-10-25: 500 mg via ORAL
  Filled 2012-10-25 (×3): qty 1

## 2012-10-25 MED ORDER — BIOTENE DRY MOUTH MT LIQD: 15.0000 mL | Freq: Two times a day (BID) | OROMUCOSAL | Status: DC

## 2012-10-25 MED ORDER — POLYETHYLENE GLYCOL 3350 17 G PO PACK: 17.0000 g | PACK | Freq: Every day | ORAL | Status: DC | PRN

## 2012-10-25 MED ORDER — POTASSIUM CHLORIDE 20 MEQ/15ML (10%) PO LIQD
40.0000 meq | Freq: Every day | ORAL | Status: DC
  Administered 2012-10-25: 40 meq
  Filled 2012-10-25 (×2): qty 30

## 2012-10-25 MED ORDER — FUROSEMIDE 40 MG PO TABS
40.0000 mg | ORAL_TABLET | Freq: Every day | ORAL | Status: DC
  Administered 2012-10-25: 40 mg
  Filled 2012-10-25: qty 1

## 2012-10-25 MED ORDER — FREE WATER: 100.0000 mL | Freq: Three times a day (TID) | Status: DC

## 2012-10-25 MED ORDER — DOCUSATE SODIUM 50 MG/5ML PO LIQD: 100.0000 mg | Freq: Two times a day (BID) | ORAL | Status: DC | PRN

## 2012-10-25 MED ORDER — CIPROFLOXACIN HCL 500 MG PO TABS: 500.0000 mg | ORAL_TABLET | Freq: Two times a day (BID) | ORAL | Status: DC

## 2012-10-25 MED ORDER — POTASSIUM CHLORIDE 20 MEQ/15ML (10%) PO LIQD: 40.0000 meq | Freq: Every day | ORAL | Status: DC

## 2012-10-25 NOTE — Progress Notes (Signed)
Patient was discharged to [facility] by ambulance. Patients IV, foley, and rectal pouch were removed prior to discharge. Tube feedings were discontinued and PEG was capped off. Report was called to nurse before patient was transferred. Patient was stable upon discharge.

## 2012-10-25 NOTE — Progress Notes (Signed)
Patient escorted to ambulance by stretcher,  no distress noted upon discharge.  Gilman Schmidt 10/25/2012 7:49 PM

## 2012-10-25 NOTE — Discharge Summary (Addendum)
Physician Discharge Summary  Jenny Brady JXB:[id number] DOB: Apr 25, 1925 DOA: 10/18/2012  PCP: Jenny Peck, MD  Admit date: 10/18/2012 Discharge date: 10/25/2012  Recommendations for Outpatient Follow-up:  1. To SNF for ongoing care, PT, OT, SLP.  Recommend palliative care follow along with the patient's progress as she has had multiple complicated hospitalizations over the last few months.  High risk for recurrent infection. 2. Continue antibiotics through July 24th.   3. Dr. Pearlean Brady, Neurology, if not already complete, within 1 month 4. [facility] within 1-2 weeks for weight check, BMP, and titration of lasix if indicated 5. PCP in 4 weeks for repeat TSH. 6. Please weigh her every day and if she gains more than 3-lbs in 1 day or 5-lbs in 1 week, increase her lasix to twice daily and call her cardiologist.     Discharge Diagnoses:  Principal Problem:   Severe sepsis Active Problems:   CAD (?stent) cath 90's Jenny Brady   HYPOTENSION   GERD (gastroesophageal reflux disease)   UTI (lower urinary tract infection)   Dysphagia, unspecified(787.20)   Acute respiratory failure with hypoxia   Acute on chronic systolic heart failure   Discharge Condition: Stable, improved  Diet recommendation: N.p.o. with tube feeds: Jevity 1.2 at 65 mL's per hour continuous and free water 100 mL per tube every 8 hours  Wt Readings from Last 3 Encounters:  10/24/12 70.081 kg (154 lb 8 oz)  09/24/12 80.1 kg (176 lb 9.4 oz)  09/24/12 80.1 kg (176 lb 9.4 oz)    History of present illness:   [name] is a 79 yo F with an extensive PMH who presented to [facility] tonight as a transfer for hypotension. The patient is aphasic and unable to provide any history. Two of her nieces are present who provide some background information but were not present for the acute events tonight. By report EMS found the patient hypoxic to 89% and hyperglycemic.   Hospital Course:   [name] was admitted  with severe sepsis with hypotension secondary to pyelonephritis and urinary tract infection with Enterobacter cloacae.  She was admitted to the ICU and transferred to the step down unit following day.  She was given aggressive IV fluids and did not require vasopressors. She remained full code.    Enterobacter pyelonephritis. She was initially started on broad-spectrum antibiotics for suspected severe sepsis and borderline septic shock. She had her antibiotics narrowed to monotherapy ceftriaxone based on her urine culture sensitivities.  She will be transitioned to ciprofloxacin to complete a 14 day course of antibiotics due to the severity of her initial illness. Her blood cultures have been no growth to date.  The second day of admission, she developed increasing shortness of breath and tachypnea.  This is felt to be due to acute on chronic systolic congestive heart failure. She has a baseline ejection fraction of 15% as of an echocardiogram from April of 2014.  She was started on Lasix 20 mg IV once daily and this was escalated to 40 mg IV twice daily. Her weight which had initially increased from 76kg to 77 kg decreased to 70 kg.  She continued to have extremity edema, particularly in her right upper and lower extremities. Vascular duplex was negative for DVT or superficial thrombosis.  Her shortness of breath improved and her oxygen saturations remained 98-100% on 1 L nasal cannula.  She should resume her Lasix 40 mg per tube once daily. If she starts to gain weight more than 3  pounds in one day or 5 pounds in one week, she should increase her Lasix to twice daily and contact her primary cardiologist right away.  Coronary artery disease, stable.  Not on ACE inhibitor or beta blocker due to low blood pressure. She continued her statin. She is not on aspirin or Plavix secondary to history of GI bleed.  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 Repeat echocardiogram demonstrated no evidence of persistent thrombus.  Diabetes mellitus, reasonably well controlled on low-dose sliding scale insulin while in the hospital.  Hemoglobin a1c was 6.2.  No need to resume insulin or diabetes medications at this time.  Repeat A1c in 3 months by primary care doctor.    History of stroke with right-sided hemiplegia, dysphagia, aphasia since April of 2014. She has been getting chronic PEG tube feedings since that time. No new deficits during this hospitalization. Her swelling of the right upper and right lower extremities may be secondary to her recent stroke in the setting of hypervolemia from her heart failure.  Dementia, currently at baseline and aphasic. Tracks but does not follow commands.  Hypothyroidism status post radioactive iodine for hyperthyroidism. Her TSH was elevated in the setting of septic shock. Her levothyroxine dose was kept the same and she should have a repeat TSH done in 4-6 weeks after discharge.  Hyperkalemia, occurred when she was receiving potassium supplementation in anticipation of diuresis. Her diuresis tapered and so her potassium rose. Her potassium supplement was discontinued and her serum potassium dropped to within normal limits. She should resume some daily potassium to prevent hypokalemia from her Lasix use.     Consultants:  PCCM  Procedures:  none  Antibiotics:  Vancomycin 7/11 >> 7/14  Zosyn 7/11 >> 7/14  Ceftriaxone 7/14 >> 7/18  Ciprofloxacin 7/18 >> 7/24  Discharge Exam: Filed Vitals:   10/25/12 1722  BP: 101/68  Pulse: 112  Temp: 99.4 F (37.4 C)  Resp: 18   Filed Vitals:   10/25/12 0920 10/25/12 1315 10/25/12 1401 10/25/12 1722  BP: 101/59 88/49 99/64  101/68  Pulse: 97 96 101 112  Temp: 99.4 F (37.4 C) 98.6 F (37 C)  99.4 F (37.4 C)  TempSrc: Axillary   Axillary  Resp: 18 18  18   Height:      Weight:      SpO2: 99% 99%  98%    General: [name], awake and  alert, No acute distress, stable from prior HEENT: NCAT, MMM, left side of face is normal, right sided facial droop  Cardiovascular: RRR, nl S1, S2 no mrg, 2+ pulses, warm extremities  Respiratory: CTAB, no increased WOB  Abdomen: NABS, soft, ND/ND.  MSK: Decreased tone and bulk right upper and lower extremities, 1+ pitting edema of the right upper extremity and right lower extremity.   Neuro: Right hemiparesis. Aphasic.   Discharge Instructions      Discharge Orders   Future Orders Complete By Expires     (HEART FAILURE PATIENTS) Call MD:  Anytime you have any of the following symptoms: 1) 3 pound weight gain in 24 hours or 5 pounds in 1 week 2) shortness of breath, with or without a dry hacking cough 3) swelling in the hands, feet or stomach 4) if you have to sleep on extra pillows at night in order to breathe.  As directed     Call MD for:  difficulty breathing, headache or visual disturbances  As directed     Call MD for:  extreme fatigue  As directed     Call MD for:  hives  As directed     Call MD for:  persistant dizziness or light-headedness  As directed     Call MD for:  persistant nausea and vomiting  As directed     Call MD for:  severe uncontrolled pain  As directed     Call MD for:  temperature >100.4  As directed     Discharge instructions  As directed     Comments:      Ms. Cressler was hospitalized with sepsis due to a urinary tract infection and with swelling which was likely due heart failure in the setting of stroke.  She was treated initially with IV fluids and antibiotics.  She will need to complete a 2 week course of antibiotics for her infection.  Because she has been on antibiotics numerous times in the last several months, she is at risk for C. Diff diarrhea.  If she develops frequent watery diarrhea, please seek medical attention for testing for C. Diff.  After her blood pressure improved, she became Khyre Germond of breath from her heart failure.  Her breathing has  improved with lasix and she will need to resume her previous lasix dose at discharge with close follow up.  She should not resume her lisinopril for now and instead follow up with cardiology in 1-2 weeks.  She should follow up with neurologist Dr. Pearlean Brady if not already complete.  Palliative care should review goals of care with the family, particularly in light of multiple recent hospitalizations with severe, life-threatening illness.    Increase activity slowly  As directed         Medication List    STOP taking these medications       lisinopril 2.5 MG tablet  Commonly known as:  ZESTRIL      TAKE these medications       acetaminophen 325 MG tablet  Commonly known as:  TYLENOL  Place 650 mg into feeding tube every 4 (four) hours as needed for pain or fever.     albuterol (5 MG/ML) 0.5% nebulizer solution  Commonly known as:  PROVENTIL  Take 0.5 mLs (2.5 mg total) by nebulization every 4 (four) hours as needed for wheezing or shortness of breath.     antiseptic oral rinse Liqd  15 mLs by Mouth Rinse route 2 (two) times daily.     cholecalciferol 1000 UNITS tablet  Commonly known as:  VITAMIN D  Place 2,000 Units into feeding tube daily.     ciprofloxacin 500 MG tablet  Commonly known as:  CIPRO  Take 1 tablet (500 mg total) by mouth 2 (two) times daily.     cyanocobalamin 500 MCG tablet  Place 500 mcg into feeding tube daily.     docusate 50 MG/5ML liquid  Commonly known as:  COLACE  Place 10 mLs (100 mg total) into feeding tube 2 (two) times daily as needed (mild constipation).     feeding supplement (JEVITY 1.5 CAL) Liqd  Place 1,000 mLs into feeding tube continuous. 65 ml/hr; on at 6am off at 2am     free water Soln  Place 100 mLs into feeding tube every 8 (eight) hours.     furosemide 40 MG tablet  Commonly known as:  LASIX  Place 40 mg into feeding tube daily.     heparin 5000 UNIT/ML injection  Inject 5,000 Units into  the skin every 8 (eight) hours.      lansoprazole 30 MG capsule  Commonly known as:  PREVACID  Take 30 mg by mouth daily. Dissolve contents of 1 capsule in 10ml of water or juice     levothyroxine 88 MCG tablet  Commonly known as:  SYNTHROID, LEVOTHROID  Place 88 mcg into feeding tube daily before breakfast.     memantine 5 MG tablet  Commonly known as:  NAMENDA  Place 5 mg into feeding tube 2 (two) times daily.     polyethylene glycol packet  Commonly known as:  MIRALAX / GLYCOLAX  Take 17 g by mouth daily as needed (mild constipation).     potassium chloride 20 MEQ/15ML (10%) solution  Place 30 mLs (40 mEq total) into feeding tube daily.     pravastatin 40 MG tablet  Commonly known as:  PRAVACHOL  Place 40 mg into feeding tube daily.     PROSTAT PO  Place 30 mLs into feeding tube daily.       Follow-up Information   Follow up with ROBERTS, [name], MD. Schedule an appointment as soon as possible for a visit in 4 weeks.   Contact information:   [address] [place] [address] [phone]       Follow up with [facility] Main Office [facility]). Schedule an appointment as soon as possible for a visit in 2 weeks.   Contact information:   [address], [address] [place] [address] [phone]      Follow up with Gates Rigg, MD. Schedule an appointment as soon as possible for a visit in 1 month.   Contact information:   [address] [place] [address] [phone]        The results of significant diagnostics from this hospitalization (including imaging, microbiology, ancillary and laboratory) are listed below for reference.    Significant Diagnostic Studies: Ct Head Wo Contrast  10/18/2012   *RADIOLOGY REPORT*  Clinical Data: Altered mental status  CT HEAD WITHOUT CONTRAST  Technique:  Contiguous axial images were obtained from the base of the skull through the vertex without contrast.  Comparison: 07/21/2012  Findings: Moderate to advanced  atrophy.  Chronic left MCA infarct with progressive encephalomalacia since the prior study.  Chronic left occipital infarct is unchanged.  No acute infarct, hemorrhage, or mass lesion.  IMPRESSION: Atrophy.  Chronic left MCA and left PCA infarct.  No acute abnormality.   Original Report Authenticated By: Janeece Riggers, M.D.   US Renal  10/19/2012   *RADIOLOGY REPORT*  Clinical Data: Urinary tract infection and acute renal injury.  RENAL/URINARY TRACT ULTRASOUND COMPLETE  Comparison:  None.  Findings:  Right Kidney:  The right kidney measures approximately 12.8 cm in length.  No evidence of hydronephrosis, significant atrophy or focal lesion.  Left Kidney:  The left kidney measures approximately 11.7 cm in length.  There is no evidence of hydronephrosis, significant atrophy or focal lesion.  Bladder:  The bladder is decompressed by a Foley catheter.  IMPRESSION: Unremarkable renal sonogram.   Original Report Authenticated By: Irish Lack, M.D.   Dg Chest Port 1 View  10/24/2012   *RADIOLOGY REPORT*  Clinical Data: Shortness of breath.  PORTABLE CHEST - 1 VIEW  Comparison: Single view of the chest 10/21/2012 and 10/20/2012.  PA and lateral chest 07/11/2011.  Findings: There is cardiomegaly.  Left basilar airspace opacities seen on the most recent examination has almost completely resolved. The right  lung remains clear.  No pneumothorax or pleural fluid is identified.  IMPRESSION:  1.  Marked improvement in left basilar aeration. 2.  Cardiomegaly without edema.   Original Report Authenticated By: Holley Dexter, M.D.   Dg Chest Port 1 View  10/22/2012   *RADIOLOGY REPORT*  Clinical Data: Worsening shortness of breath; tachypnea and congestion.  PORTABLE CHEST - 1 VIEW  Comparison: Chest radiograph performed 10/20/2012  Findings: The lungs are well-aerated.  Left basilar airspace opacity raises concern for mild pneumonia.  Vascular congestion and peribronchial thickening are seen.  There is no definite  evidence of pleural effusion or pneumothorax.  The cardiomediastinal silhouette is mildly enlarged.  No acute osseous abnormalities are seen.  IMPRESSION: Vascular congestion and mild cardiomegaly; left basilar airspace opacity raises concern for mild pneumonia.  Mild asymmetric interstitial edema might have a similar appearance.   Original Report Authenticated By: Tonia Ghent, M.D.   Dg Chest Port 1 View  10/20/2012   *RADIOLOGY REPORT*  Clinical Data: Sepsis  PORTABLE CHEST - 1 VIEW  Comparison: 10/18/2012  Findings: Left lower lobe airspace disease is unchanged from the prior study.  This may represent pneumonia or atelectasis.  Small left effusion unchanged.  The right lung is clear.  Cardiac enlargement without heart failure.  IMPRESSION: No significant change left lower lobe airspace disease.   Original Report Authenticated By: Janeece Riggers, M.D.   Dg Chest Portable 1 View  10/18/2012   *RADIOLOGY REPORT*  Clinical Data: Fever.  Right hand and arm swelling.  PORTABLE CHEST - 1 VIEW  Comparison: 09/17/2012.  Findings: High poor inspiration.  Normal sized heart.  Clear lungs with diffuse peribronchial thickening.  Thoracic spine degenerative changes.  Linear density at the left lung base.  IMPRESSION:  1.  Mild left basilar atelectasis or scarring.  2.  Mild bronchitic changes.   Original Report Authenticated By: Beckie Salts, M.D.   Dg Abd Portable 1v  10/19/2012   *RADIOLOGY REPORT*  Clinical Data: Pain around peg site  PORTABLE ABDOMEN - 1 VIEW  Comparison: 09/17/2012  Findings: Peg tube is unchanged overlying the gastric region.  Normal bowel gas pattern.  No evidence of obstruction or ileus. There is stool in the rectum, improved from the prior study.  IMPRESSION: Peg tube is unchanged in position.  Improvement in rectal impaction.   Original Report Authenticated By: Janeece Riggers, M.D.    Microbiology: Recent Results (from the past 240 hour(s))  CULTURE, BLOOD (ROUTINE X 2)     Status: None    Collection Time    10/18/12  6:44 PM      Result Value Range Status   Specimen Description BLOOD ARM RIGHT   Final   Special Requests BOTTLES DRAWN AEROBIC ONLY 5CC   Final   Culture  Setup Time 10/19/2012 04:01   Final   Culture NO GROWTH 5 DAYS   Final   Report Status 10/25/2012 FINAL   Final  CULTURE, BLOOD (ROUTINE X 2)     Status: None   Collection Time    10/18/12  6:53 PM      Result Value Range Status   Specimen Description BLOOD HAND RIGHT   Final   Special Requests BOTTLES DRAWN AEROBIC ONLY 4CC   Final   Culture  Setup Time 10/19/2012 04:00   Final   Culture NO GROWTH 5 DAYS   Final   Report Status 10/25/2012 FINAL   Final  URINE CULTURE     Status: None   Collection  Time    10/18/12  7:26 PM      Result Value Range Status   Specimen Description URINE, CATHETERIZED   Final   Special Requests NONE   Final   Culture  Setup Time 10/19/2012 04:43   Final   Colony Count >=100,000 COLONIES/ML   Final   Culture ENTEROBACTER CLOACAE   Final   Report Status 10/21/2012 FINAL   Final   Organism ID, Bacteria ENTEROBACTER CLOACAE   Final  MRSA PCR SCREENING     Status: None   Collection Time    10/18/12 10:12 PM      Result Value Range Status   MRSA by PCR NEGATIVE  NEGATIVE Final   Comment:            The GeneXpert MRSA Assay (FDA     approved for NASAL specimens     only), is one component of a     comprehensive MRSA colonization     surveillance program. It is not     intended to diagnose MRSA     infection nor to guide or     monitor treatment for     MRSA infections.  CLOSTRIDIUM DIFFICILE BY PCR     Status: None   Collection Time    10/21/12  3:05 PM      Result Value Range Status   C difficile by pcr NEGATIVE  NEGATIVE Final     Labs: Basic Metabolic Panel:  Recent Labs Lab 10/19/12 0640  10/21/12 0500 10/22/12 0630 10/23/12 0540 10/24/12 0600 10/25/12 0535  NA 145  < > 144 145 142 134* 134*  K 3.1*  < > 2.9* 3.5 4.7 5.5* 3.9  CL 110  < > 109 107  106 100 95*  CO2 29  < > 24 28 29 24 31   GLUCOSE 123*  < > 217* 173* 155* 138* 180*  BUN 38*  < > 18 15 15 17 23   CREATININE 0.57  < > 0.41* 0.43* 0.39* 0.42* 0.41*  CALCIUM 8.7  < > 8.6 8.9 9.4 9.6 9.5  MG 2.0  --   --   --  2.0  --   --   PHOS 3.7  --  3.3  --   --   --   --   < > = values in this interval not displayed. Liver Function Tests:  Recent Labs Lab 10/18/12 1853 10/21/12 0500  AST 15  --   ALT 20  --   ALKPHOS 85  --   BILITOT 0.6  --   PROT 7.0  --   ALBUMIN 2.1* 2.1*   No results found for this basename: LIPASE, AMYLASE,  in the last 168 hours  Recent Labs Lab 10/18/12 1857  AMMONIA 34   CBC:  Recent Labs Lab 10/18/12 1853  10/20/12 0425 10/21/12 0500 10/22/12 0630 10/24/12 0600 10/25/12 0535  WBC 12.6*  < > 7.4 8.2 6.6 8.2 8.5  NEUTROABS 10.1*  --   --   --   --   --   --   HGB 12.0  < > 8.9* 9.2* 10.0* 12.2 11.7*  HCT 37.2  < > 27.7* 29.3* 31.7* 36.8 35.0*  MCV 89.4  < > 91.1 91.3 91.6 90.2 89.5  PLT 294  < > 205 206 225 PLATELET CLUMPS NOTED ON SMEAR, COUNT APPEARS ADEQUATE 255  < > = values in this interval not displayed. Cardiac Enzymes:  Recent Labs Lab 10/18/12 1856  TROPONINI <0.30  BNP: BNP (last 3 results)  Recent Labs  07/21/12 1635  PROBNP 9584.0*   CBG:  Recent Labs Lab 10/24/12 2007 10/25/12 0006 10/25/12 0406 10/25/12 0731 10/25/12 1146  GLUCAP 158* 157* 133* 116* 162*    Time coordinating discharge: 45 minutes  Signed:  Treasa Bradshaw  [facility] 10/25/2012, 5:24 PM

## 2012-10-25 NOTE — Clinical Social Work Psychosocial (Signed)
Clinical Social Work Department BRIEF PSYCHOSOCIAL ASSESSMENT AND DISCHARGE NOTE 10/25/2012  Patient:  TOMORROW, DEHAAS     Account Number:  [id number]     Admit date:  10/18/2012  Clinical Social Worker:  Delmer Islam  Date/Time:  10/25/2012 06:38 AM  Referred by:  Physician  Date Referred:  10/22/2012 Referred for  SNF Placement   Other Referral:   Interview type:  Family Other interview type:    PSYCHOSOCIAL DATA Living Status:  FACILITY Admitted from facility:  [facility] Level of care:   Primary support name:  [name] Contongiannis Primary support relationship to patient:  FAMILY Degree of support available:   Patient has 2 very involved and supportive nieces: Brodie Ann Contongiannis and [name].    CURRENT CONCERNS Current Concerns  Post-Acute Placement   Other Concerns:    SOCIAL WORK ASSESSMENT / PLAN On 10/22/12 CSW talked with May Ann Contongiannis as she was a t hospital to talk with MD. Niece reported that paitent from [facility] and she would be returning there at discharge. Niece also advised that they did not pay to hold a bed.   Assessment/plan status:  No Further Intervention Required Other assessment/ plan:   Information/referral to community resources:    PATIENT'S/FAMILY'S RESPONSE TO PLAN OF CARE: Niece Maurilio Lovely that CSW will assist with getting patient back to facility.    10/25/12 - DISCHARGE NOTE: Patient discharged back to [facility]. CSW facilitated transport via ambulance. Admissions director advised of discharge and discharge information forwarded to facility. CSW talked with both nieces today and they are aware of discharge.

## 2012-10-27 ENCOUNTER — Emergency Department (HOSPITAL_COMMUNITY): Payer: Medicare Other

## 2012-10-27 ENCOUNTER — Emergency Department (HOSPITAL_COMMUNITY)
Admission: EM | Admit: 2012-10-27 | Discharge: 2012-10-27 | Disposition: A | Discharge status: Home / Self Care | Payer: Medicare Other | Attending: Emergency Medicine | Admitting: Emergency Medicine

## 2012-10-27 ENCOUNTER — Encounter (HOSPITAL_COMMUNITY): Payer: Self-pay

## 2012-10-27 DIAGNOSIS — Z86718 Personal history of other venous thrombosis and embolism: Secondary | ICD-10-CM | POA: Insufficient documentation

## 2012-10-27 DIAGNOSIS — Z466 Encounter for fitting and adjustment of urinary device: Secondary | ICD-10-CM | POA: Insufficient documentation

## 2012-10-27 DIAGNOSIS — K589 Irritable bowel syndrome without diarrhea: Secondary | ICD-10-CM | POA: Insufficient documentation

## 2012-10-27 DIAGNOSIS — Z931 Gastrostomy status: Secondary | ICD-10-CM | POA: Insufficient documentation

## 2012-10-27 DIAGNOSIS — Z888 Allergy status to other drugs, medicaments and biological substances status: Secondary | ICD-10-CM | POA: Insufficient documentation

## 2012-10-27 DIAGNOSIS — Z79899 Other long term (current) drug therapy: Secondary | ICD-10-CM | POA: Insufficient documentation

## 2012-10-27 DIAGNOSIS — R339 Retention of urine, unspecified: Secondary | ICD-10-CM

## 2012-10-27 DIAGNOSIS — E0590 Thyrotoxicosis, unspecified without thyrotoxic crisis or storm: Secondary | ICD-10-CM | POA: Insufficient documentation

## 2012-10-27 DIAGNOSIS — I69959 Hemiplegia and hemiparesis following unspecified cerebrovascular disease affecting unspecified side: Secondary | ICD-10-CM | POA: Insufficient documentation

## 2012-10-27 DIAGNOSIS — G4733 Obstructive sleep apnea (adult) (pediatric): Secondary | ICD-10-CM | POA: Insufficient documentation

## 2012-10-27 DIAGNOSIS — I69920 Aphasia following unspecified cerebrovascular disease: Secondary | ICD-10-CM | POA: Insufficient documentation

## 2012-10-27 DIAGNOSIS — I2510 Atherosclerotic heart disease of native coronary artery without angina pectoris: Secondary | ICD-10-CM | POA: Insufficient documentation

## 2012-10-27 DIAGNOSIS — F0390 Unspecified dementia without behavioral disturbance: Secondary | ICD-10-CM | POA: Insufficient documentation

## 2012-10-27 DIAGNOSIS — Z87441 Personal history of nephrotic syndrome: Secondary | ICD-10-CM | POA: Insufficient documentation

## 2012-10-27 DIAGNOSIS — I5022 Chronic systolic (congestive) heart failure: Secondary | ICD-10-CM | POA: Insufficient documentation

## 2012-10-27 DIAGNOSIS — I69921 Dysphasia following unspecified cerebrovascular disease: Secondary | ICD-10-CM | POA: Insufficient documentation

## 2012-10-27 DIAGNOSIS — R609 Edema, unspecified: Secondary | ICD-10-CM | POA: Insufficient documentation

## 2012-10-27 DIAGNOSIS — Z882 Allergy status to sulfonamides status: Secondary | ICD-10-CM | POA: Insufficient documentation

## 2012-10-27 DIAGNOSIS — E669 Obesity, unspecified: Secondary | ICD-10-CM | POA: Insufficient documentation

## 2012-10-27 DIAGNOSIS — R569 Unspecified convulsions: Secondary | ICD-10-CM | POA: Insufficient documentation

## 2012-10-27 DIAGNOSIS — Z8679 Personal history of other diseases of the circulatory system: Secondary | ICD-10-CM | POA: Insufficient documentation

## 2012-10-27 DIAGNOSIS — K219 Gastro-esophageal reflux disease without esophagitis: Secondary | ICD-10-CM | POA: Insufficient documentation

## 2012-10-27 LAB — URINALYSIS, ROUTINE W REFLEX MICROSCOPIC
Bilirubin Urine: NEGATIVE
Glucose, UA: NEGATIVE mg/dL
Leukocytes, UA: NEGATIVE
Nitrite: NEGATIVE
Specific Gravity, Urine: 1.019 (ref 1.005–1.030)
pH: 6.5 (ref 5.0–8.0)

## 2012-10-27 LAB — POCT I-STAT, CHEM 8
BUN: 26 mg/dL — ABNORMAL HIGH (ref 6–23)
Calcium, Ion: 1.11 mmol/L — ABNORMAL LOW (ref 1.13–1.30)
Creatinine, Ser: 0.8 mg/dL (ref 0.50–1.10)
Hemoglobin: 12.6 g/dL (ref 12.0–15.0)
TCO2: 32 mmol/L (ref 0–100)

## 2012-10-27 LAB — CBC WITH DIFFERENTIAL/PLATELET
Basophils Absolute: 0 10*3/uL (ref 0.0–0.1)
Eosinophils Relative: 2 % (ref 0–5)
HCT: 35.7 % — ABNORMAL LOW (ref 36.0–46.0)
Lymphocytes Relative: 16 % (ref 12–46)
Lymphs Abs: 1.6 10*3/uL (ref 0.7–4.0)
MCV: 90.4 fL (ref 78.0–100.0)
Monocytes Absolute: 0.5 10*3/uL (ref 0.1–1.0)
Neutro Abs: 7.4 10*3/uL (ref 1.7–7.7)
RBC: 3.95 MIL/uL (ref 3.87–5.11)
RDW: 18.4 % — ABNORMAL HIGH (ref 11.5–15.5)
WBC: 9.6 10*3/uL (ref 4.0–10.5)

## 2012-10-27 MED ORDER — IOHEXOL 300 MG/ML  SOLN
50.0000 mL | Freq: Once | INTRAMUSCULAR | Status: AC | PRN
  Administered 2012-10-27: 10 mL

## 2012-10-27 MED ORDER — SODIUM CHLORIDE 0.9 % IV SOLN
500.0000 mg | Freq: Once | INTRAVENOUS | Status: AC
  Administered 2012-10-27: 500 mg via INTRAVENOUS
  Filled 2012-10-27: qty 5

## 2012-10-27 MED ORDER — LEVETIRACETAM 100 MG/ML PO SOLN: 500.0000 mg | Freq: Two times a day (BID) | ORAL | Status: DC

## 2012-10-27 NOTE — ED Notes (Signed)
[facility] here to transport pt to NH.  Report was already called by [facility].

## 2012-10-27 NOTE — ED Notes (Addendum)
Checking on patient's 12 lead placement.

## 2012-10-27 NOTE — ED Notes (Signed)
Assist nurse with cleaning bed and patient. Assist with catheter placement.

## 2012-10-27 NOTE — ED Notes (Signed)
Witnessed pt having a tonic clonic seizure lasting approx 20 sec. Pt's eyes were open and fixed. Pt non responsive. Pt with rigidity. Maintained pt's airway. Dr. Hyacinth Meeker made aware.

## 2012-10-27 NOTE — ED Notes (Signed)
MD at bedside. (Dr. Miller) 

## 2012-10-27 NOTE — ED Provider Notes (Signed)
History    CSN: [id number] Arrival date & time 10/27/12  0910  First MD Initiated Contact with Patient 10/27/12 [phone]     Chief Complaint  Patient presents with  . Abdominal Pain   (Consider location/radiation/quality/duration/timing/severity/associated sxs/prior Treatment) HPI Comments: 79 year old female who was recently discharged from the hospital 2 days ago after she was admitted for approximately one week with severe sepsis and septic shock from pyelonephritis. She received IV antibiotics, IV fluid resuscitation and was eventually discharged from the hospital in a stable condition. She does have a history of severe stroke causing right-sided hemiplegia, complete aphasia and dysphagia which has mandated PEG tube feeds for ongoing nutrition. She was placed in a skilled nursing facility and this morning the nurses while evaluating her PEG tube found that her abdomen seemed to be uncomfortable and she had grimacing with manipulation of her abdomen and the feeds. The patient is nonverbal, a level V [employer] the patient is unable to communicate with me verbally.  Patient is a 79 y.o. female presenting with abdominal pain. The history is provided by the EMS personnel, the nursing home and medical records.  Abdominal Pain Associated symptoms include abdominal pain.   Past Medical History  Diagnosis Date  . Hyperthyroidism     had RAI-is on thyroid replacement, happened maybe this summer  . Coronary artery disease ?, before 2005    Had a catheterization per son by Dr. [name] in 1991 no reports in  [place].  note from 2005 mentions hx of coronary stent  . GERD (gastroesophageal reflux disease)   . Stroke 07/2012    Right hemiplegia, dysphagia, aphasia.  . Dementia   . Aphasia 07/2012  . Chronic systolic heart failure     07/2012 the EF was 15%  . Mural thrombus of cardiac apex   . Obesity   . IBS (irritable bowel syndrome)   . DVT (deep venous thrombosis) before 2005    occurred  after trauma, treated for a while with Coumadin.   Marland Kitchen Dysphagia due to recent stroke 07/2012    ok'd for purree/pudding thick liquids but G tube placed by IR  . OSA (obstructive sleep apnea)     refused CPAP per notes.    Past Surgical History  Procedure Laterality Date  . Tee without cardioversion N/A 07/26/2012    Procedure: TRANSESOPHAGEAL ECHOCARDIOGRAM (TEE);  Surgeon: Laurey Morale, MD;  Location: [facility] ENDOSCOPY;  Service: Cardiovascular;  Laterality: N/A;  . Gastric feeding tube  08/02/2012    placed in radiology   Family History  Problem Relation Age of Onset  . Acne Mother     died giving birth to 8th kid  . Dementia Father     died 37  . Acne Brother     died 2-3 yrs ago  . Cervical cancer Sister    History  Substance Use Topics  . Smoking status: Never Smoker   . Smokeless tobacco: Never Used  . Alcohol Use: No   OB History   Grav Para Term Preterm Abortions TAB SAB Ect Mult Living                 Review of Systems  Unable to perform ROS: Patient nonverbal  Gastrointestinal: Positive for abdominal pain.    Allergies  Hyoscyamine; Ivp dye; and Septra  Home Medications   Current Outpatient Rx  Name  Route  Sig  Dispense  Refill  . antiseptic oral rinse (BIOTENE) LIQD   Mouth Rinse  15 mLs by Mouth Rinse route 2 (two) times daily.         . cholecalciferol (VITAMIN D) 1000 UNITS tablet   Per Tube   Place 2,000 Units into feeding tube daily.         . ciprofloxacin (CIPRO) 500 MG tablet   Oral   Take 1 tablet (500 mg total) by mouth 2 (two) times daily.         . cyanocobalamin 500 MCG tablet   Per Tube   Place 500 mcg into feeding tube daily.         . furosemide (LASIX) 40 MG tablet   Per Tube   Place 40 mg into feeding tube daily.         . heparin 5000 UNIT/ML injection   Subcutaneous   Inject 5,000 Units into the skin every 8 (eight) hours.         . lansoprazole (PREVACID) 30 MG capsule   Oral   Take 30 mg by mouth daily.  Dissolve contents of 1 capsule in 10ml of water or juice         . levothyroxine (SYNTHROID, LEVOTHROID) 88 MCG tablet   Per Tube   Place 88 mcg into feeding tube daily before breakfast.         . memantine (NAMENDA) 5 MG tablet   Per Tube   Place 5 mg into feeding tube 2 (two) times daily.          . Nutritional Supplements (FEEDING SUPPLEMENT, JEVITY 1.5 CAL,) LIQD   Per Tube   Place 1,000 mLs into feeding tube continuous. 65 ml/hr; on at 6am off at 2am      0   . Pollen Extracts (PROSTAT PO)   Per Tube   Place 30 mLs into feeding tube daily.         . pravastatin (PRAVACHOL) 40 MG tablet   Per Tube   Place 40 mg into feeding tube daily.         . Water For Irrigation, Sterile (FREE WATER) SOLN   Per Tube   Place 100 mLs into feeding tube every 8 (eight) hours.         Marland Kitchen acetaminophen (TYLENOL) 325 MG tablet   Per Tube   Place 650 mg into feeding tube every 4 (four) hours as needed for pain or fever.         Marland Kitchen albuterol (PROVENTIL) (5 MG/ML) 0.5% nebulizer solution   Nebulization   Take 0.5 mLs (2.5 mg total) by nebulization every 4 (four) hours as needed for wheezing or shortness of breath.   20 mL      . docusate (COLACE) 50 MG/5ML liquid   Per Tube   Place 10 mLs (100 mg total) into feeding tube 2 (two) times daily as needed (mild constipation).   100 mL   0   . levETIRAcetam (KEPPRA) 100 MG/ML solution   Per Tube   Place 5 mLs (500 mg total) into feeding tube 2 (two) times daily.   1000 mL   12   . polyethylene glycol (MIRALAX / GLYCOLAX) packet   Oral   Take 17 g by mouth daily as needed (mild constipation).   14 each   0    BP 107/73  Pulse 111  Temp(Src) 98.1 F (36.7 C) (Axillary)  Resp 22  SpO2 99% Physical Exam  Nursing note and vitals reviewed. Constitutional: No distress.  HENT:  Head: Normocephalic and atraumatic.  The patient  keeps her jaw was clenched, she will not open her mouth  Eyes: Conjunctivae are normal. Pupils  are equal, round, and reactive to light. Right eye exhibits no discharge. Left eye exhibits no discharge. No scleral icterus.  Neck: Normal range of motion. Neck supple. No JVD present. No thyromegaly present.  Cardiovascular: Normal rate, regular rhythm, normal heart sounds and intact distal pulses.  Exam reveals no gallop and no friction rub.   No murmur heard. Pulmonary/Chest: Effort normal and breath sounds normal. No respiratory distress. She has no wheezes. She has no rales.  Abdominal: Soft. Bowel sounds are normal. She exhibits no distension and no mass. There is tenderness ( Lower abdominal tenderness in the suprapubic region, there is fullness in this region, no other abdominal tenderness, no peritoneal signs).  Musculoskeletal: Normal range of motion. She exhibits edema ( Bilateral mild edema). She exhibits no tenderness.  Lymphadenopathy:    She has no cervical adenopathy.  Neurological: She is alert. Coordination normal.  Right-sided hemiplegia, no movement of the right leg or right arm, facial droop, does not speak  Skin: Skin is warm and dry. No rash noted. No erythema.  Psychiatric: She has a normal mood and affect. Her behavior is normal.    ED Course  Procedures (including critical care time) Labs Reviewed  CBC WITH DIFFERENTIAL - Abnormal; Notable for the following:    Hemoglobin 11.9 (*)    HCT 35.7 (*)    RDW 18.4 (*)    All other components within normal limits  POCT I-STAT, CHEM 8 - Abnormal; Notable for the following:    BUN 26 (*)    Glucose, Bld 145 (*)    Calcium, Ion 1.11 (*)    All other components within normal limits  URINE CULTURE  URINALYSIS, ROUTINE W REFLEX MICROSCOPIC   Dg Abd 1 View  10/27/2012   *RADIOLOGY REPORT*  Clinical Data: Placement of gastrostomy tube.  ABDOMEN - 1 VIEW  Comparison: None.  Findings: There is a gastrostomy tube identified within the projection of the left upper quadrant of the abdomen. There is an adjacent gastropexy tack.  Enteric contrast material opacifies the stomach.  There is gas and stool noted throughout the colon up to the rectum. No dilated small bowel loops noted.  IMPRESSION:  1. Injection of contrast material through the gastrostomy tube confirms intraluminal placement.   Original Report Authenticated By: Signa Kell, M.D.   Ct Head Wo Contrast  10/27/2012   *RADIOLOGY REPORT*  Clinical Data: Severe headache  CT HEAD WITHOUT CONTRAST  Technique:  Contiguous axial images were obtained from the base of the skull through the vertex without contrast.  Comparison: Prior CT scan of the head 10/18/2012  Findings: No acute intracranial hemorrhage, acute infarction, mass lesion, mass effect, midline shift or hydrocephalus.  Gray-white differentiation is preserved throughout.  Stable appearance of encephalomalacia throughout the left MCA territory consistent with remote infarct. Focal encephalomalacia in the left occipital lobe in the PCA territory is also unchanged consistent with remote prior infarct.  Stable global cerebral and cerebellar atrophy.  Bibasilar calcifications again noted incidentally.  Globes and orbits are intact and symmetric bilaterally.  No focal soft tissue or calvarial abnormality.  Atherosclerotic calcifications noted in the cavernous carotid arteries bilaterally.  IMPRESSION:  1.  No acute intracranial abnormality.  2.  Stable appearance of the brain.   Original Report Authenticated By: Malachy Moan, M.D.   1. Urinary retention   2. Seizure     MDM  Pt  has normal VS, has abd pain, to exam but the PEG tube site looks clean without redness, drainage or foul smells.  The tube flushes without difficulty.  She has Urine in her bladder on bedside bladder scan - will place foley as this may be related to urinary retention, no vomiting, no fevers and normal VS.  Filed Vitals:   10/27/12 1342  BP: 107/73  Pulse: 111  Temp:   Resp: 22   There was a short 15-20 second seizure witnessed by  nursing staff - this stopped quickly and she did not appear to be post ictal on my exam though the exam was of course extremely limited based on the pt's underlying stroke.  The CT is negative for acute findings of the brain.  I have discussed care with Dr. Leroy Kennedy who agrees with starting Keppra for the pt and d/c from neuro standpoint.  KUB with contrast ordered for G tube placement.  VS normal.    G tube placed and in normal location - pt stable for d/c, no further seizure activity.  keppra given.  Patient stable for discharge, on repeat exam she has a soft nontender abdomen.  Vida Roller, MD 10/27/12 [phone]

## 2012-10-27 NOTE — ED Notes (Addendum)
Pt arrives [facility] from [facility] for abdominal pain. Per [facility], nursing staff attempted to flush and administer G-tube feeds this morning. Pt was grimacing in pain. Drainage from site noted. Nursing home staff report pt's mental status is at her baseline.

## 2012-10-27 NOTE — ED Notes (Signed)
Patient transported to CT 

## 2012-10-29 LAB — URINE CULTURE: Colony Count: NO GROWTH

## 2012-10-31 ENCOUNTER — Other Ambulatory Visit (INDEPENDENT_AMBULATORY_CARE_PROVIDER_SITE_OTHER): Payer: Medicare Other

## 2012-10-31 ENCOUNTER — Ambulatory Visit (INDEPENDENT_AMBULATORY_CARE_PROVIDER_SITE_OTHER): Payer: Self-pay | Admitting: Neurology

## 2012-10-31 ENCOUNTER — Encounter: Payer: Self-pay | Admitting: Neurology

## 2012-10-31 VITALS — BP 111/71 | HR 93 | Temp 97.1°F

## 2012-10-31 DIAGNOSIS — G40209 Localization-related (focal) (partial) symptomatic epilepsy and epileptic syndromes with complex partial seizures, not intractable, without status epilepticus: Secondary | ICD-10-CM

## 2012-10-31 NOTE — Patient Instructions (Addendum)
Checked urgent EEG in the office today and seizures not noted hence we'll hold off on anticonvulsants for now. Mobilize out of bed with physical ,occupational and speech therapies.. Discussion with patient's daughters about goals of care and her quality of life and they've wished to continue aggressive care for now. Followup in 3 months with Heide Guile, NP

## 2012-11-01 ENCOUNTER — Other Ambulatory Visit: Payer: Self-pay | Admitting: Radiology

## 2012-11-01 ENCOUNTER — Other Ambulatory Visit: Payer: Medicare Other | Admitting: Radiology

## 2012-11-01 DIAGNOSIS — G40209 Localization-related (focal) (partial) symptomatic epilepsy and epileptic syndromes with complex partial seizures, not intractable, without status epilepticus: Secondary | ICD-10-CM

## 2012-11-02 NOTE — Progress Notes (Signed)
[facility] [address] [place]. [place] [address] [phone]       OFFICE FOLLOW-UP NOTE  Ms. Ellarose Brandi Egge Date of Birth:  01-05-1926 Medical Record Number:  [id number]   HPI: 79 year old lady with history of baseline dementia, congestive heart failure, coronary artery disease, hyperthyroidism suffered a large left middle cerebral artery embolic infarct in April 2014 and complicated subsequent course with rectal bleeding on warfarin as well as recurrent urinary tract infections is seen today first of his for visit following hospital consultation for stroke in April 2014. Patient is accompanied by two of her daughters who states that she has not made any neurologic improvement. She remains globally aphasic and is barely able to socially acknowledged them and will very rarely follow commands. She has persistent dense right hemiplegia and will move the left side purposefully. She was getting physical pressure therapy and was initially had been at [facility] nursing home but family were not happy with the care she got there. She was admitted twice the first time with lower GI bleeding from diverticulosis while she was on warfarin which was discontinued and she required blood transfusion. She was subsequently admitted recently with urinaryt tract infection and was discharged a few days ago. When she was seen in the emergency room there was a question of having a possible seizure as witnessed by ER nurse. However this was apparently quite transient and by the tenderness vent out and brought the ER M.D. into the room he did not see any seizure activity. She was however advised to follow up with me for further evaluation procedure. The patient has not had any prior seizure history as per the family. The family did not notice any other episodes suggestive of seizure. I  But noticed intermittent focal twitching in her left leg the family stated that she has had this for several months. They  have never noticed any twitchings involving her face or arms or hands. She has been noted as being alert despite this twitchings. She had a CT scan of the head last week while in the emergency room which showed evolutionary changes in the large right middle cerebral artery infarct without any acute abnormality.  ROS:   14 system review of systems is positive for weight loss, leg swelling, high pain, easy bruising, confusion, speech difficulty, memory loss, flushing, constipation, urination problems and frequent urinary infections  PMH:  Past Medical History  Diagnosis Date  . Hyperthyroidism     had RAI-is on thyroid replacement, happened maybe this summer  . Coronary artery disease ?, before 2005    Had a catheterization per son by Dr. [name] in 1991 no reports in  [place].  note from 2005 mentions hx of coronary stent  . GERD (gastroesophageal reflux disease)   . Stroke 07/2012    Right hemiplegia, dysphagia, aphasia.  . Dementia   . Aphasia 07/2012  . Chronic systolic heart failure     07/2012 the EF was 15%  . Mural thrombus of cardiac apex   . Obesity   . IBS (irritable bowel syndrome)   . DVT (deep venous thrombosis) before 2005    occurred after trauma, treated for a while with Coumadin.   Marland Kitchen Dysphagia due to recent stroke 07/2012    ok'd for purree/pudding thick liquids but G tube placed by IR  . OSA (obstructive sleep apnea)     refused CPAP per notes.     Social History:  History   Social History  .  Marital Status: Widowed    Spouse Name: N/A    Number of Children: N/A  . Years of Education: N/A   Occupational History  . Not on file.   Social History Main Topics  . Smoking status: Never Smoker   . Smokeless tobacco: Never Used  . Alcohol Use: No  . Drug Use: No  . Sexually Active:    Other Topics Concern  . Not on file   Social History Narrative   [phone] Yuki Brunsman, NOK-lives with them    Medications:   Current Outpatient Prescriptions on  File Prior to Visit  Medication Sig Dispense Refill  . antiseptic oral rinse (BIOTENE) LIQD 15 mLs by Mouth Rinse route 2 (two) times daily.      . ciprofloxacin (CIPRO) 500 MG tablet Take 1 tablet (500 mg total) by mouth 2 (two) times daily.      . cyanocobalamin 500 MCG tablet Place 500 mcg into feeding tube daily.      . Water For Irrigation, Sterile (FREE WATER) SOLN Place 100 mLs into feeding tube every 8 (eight) hours.      Marland Kitchen acetaminophen (TYLENOL) 325 MG tablet Place 650 mg into feeding tube every 4 (four) hours as needed for pain or fever.      Marland Kitchen albuterol (PROVENTIL) (5 MG/ML) 0.5% nebulizer solution Take 0.5 mLs (2.5 mg total) by nebulization every 4 (four) hours as needed for wheezing or shortness of breath.  20 mL    . cholecalciferol (VITAMIN D) 1000 UNITS tablet Place 2,000 Units into feeding tube daily.      Marland Kitchen docusate (COLACE) 50 MG/5ML liquid Place 10 mLs (100 mg total) into feeding tube 2 (two) times daily as needed (mild constipation).  100 mL  0  . furosemide (LASIX) 40 MG tablet Place 40 mg into feeding tube daily.      . heparin 5000 UNIT/ML injection Inject 5,000 Units into the skin every 8 (eight) hours.      . lansoprazole (PREVACID) 30 MG capsule Take 30 mg by mouth daily. Dissolve contents of 1 capsule in 10ml of water or juice      . levETIRAcetam (KEPPRA) 100 MG/ML solution Place 5 mLs (500 mg total) into feeding tube 2 (two) times daily.  1000 mL  12  . levothyroxine (SYNTHROID, LEVOTHROID) 88 MCG tablet Place 88 mcg into feeding tube daily before breakfast.      . memantine (NAMENDA) 5 MG tablet Place 5 mg into feeding tube 2 (two) times daily.       . Nutritional Supplements (FEEDING SUPPLEMENT, JEVITY 1.5 CAL,) LIQD Place 1,000 mLs into feeding tube continuous. 65 ml/hr; on at 6am off at 2am    0  . Pollen Extracts (PROSTAT PO) Place 30 mLs into feeding tube daily.      . polyethylene glycol (MIRALAX / GLYCOLAX) packet Take 17 g by mouth daily as needed (mild  constipation).  14 each  0  . pravastatin (PRAVACHOL) 40 MG tablet Place 40 mg into feeding tube daily.       No current facility-administered medications on file prior to visit.    Allergies:   Allergies  Allergen Reactions  . Hyoscyamine Other (See Comments)    Per MAR  . Ivp Dye (Iodinated Diagnostic Agents) Other (See Comments)    Per MAR  . Septra (Sulfamethoxazole W-Trimethoprim) Other (See Comments)    Per MAR    Physical Exam General: well developed, well nourished, seated, in no evident distress Head: head  normocephalic and atraumatic. Orohparynx benign Neck: supple with no carotid or supraclavicular bruits Cardiovascular: regular rate and rhythm, no murmurs Musculoskeletal: no deformity Skin:  no rash/petichiae. 2+ pedal edema bilaterally. Vascular:  Normal pulses all extremities Filed Vitals:   10/31/12 1329  BP: 111/71  Pulse: 93  Temp: 97.1 F (36.2 C)    Neurologic Exam  : Awake and fully alert. Globally aphasic and mute and will follow only  occasional midline and few left-sided commands. Eyes are in primary position. Blinks to threat on the left could not the right. Fundi weren't visualized. Mild right lower facial weakness. Tongue is midline. Spastic dense right hemiplegia with 0/5 strength and increased tone. Purposeful antigravity movement in the left approximately. Mild left lower extremity weakness but can withdraw against gravity tube touch. Right plantar is upgoing left is downgoing. Suspect numbness sensation on the right compared to the left.  Coordination cannot be tested on the right and normal in the left approximately. Deep tendon reflexes are exaggerated on the right and normal on the left.  Noted intermittent focal jerking of the left great toe and foot which could not be suppressed with distractibility. Urgent EEG obtained in the office which was technically limited by patient motion but did not see corresponding focal seizure activity from the right  brain.   ASSESSMENT: 5 year lady with large left middle cerebral artery infarct April 2014 with significant aphasia and dense right hemiplegia likely cardioembolic etiology from left ventricular thrombus with low ejection fraction and atrial fibrillation with the recommendations for a ejection fraction. Recent recurrent admissions for rectal bleeding from diverticulosis  On warfarin , UTI and congestive heart failure. Baseline dementia and multiple medical problems.    PLAN: Checked urgent EEG in the office today and seizures not noted hence we'll hold off on anticonvulsants for now. Mobilize out of bed with physical ,occupational and speech therapies.. Discussion with patient's daughters about goals of care and her quality of life and they've wished to continue aggressive care for now. This was a prolonged office visit requiring extensive history taking, review of patient's chart and data, extensive discussion with the family, medical decision making of high complexity requiring more than 60 minutes . Followup in 3 months with Heide Guile, NP

## 2012-11-11 ENCOUNTER — Emergency Department (HOSPITAL_COMMUNITY): Payer: Medicare Other

## 2012-11-11 ENCOUNTER — Inpatient Hospital Stay (HOSPITAL_COMMUNITY)
Admission: EM | Admit: 2012-11-11 | Discharge: 2012-12-12 | DRG: 004 | Disposition: A | Discharge status: Other Acute Inpatient Hospital | Payer: Medicare Other | Attending: Internal Medicine | Admitting: Internal Medicine

## 2012-11-11 ENCOUNTER — Encounter (HOSPITAL_COMMUNITY): Payer: Self-pay | Admitting: Emergency Medicine

## 2012-11-11 DIAGNOSIS — F03918 Unspecified dementia, unspecified severity, with other behavioral disturbance: Secondary | ICD-10-CM

## 2012-11-11 DIAGNOSIS — I2510 Atherosclerotic heart disease of native coronary artery without angina pectoris: Secondary | ICD-10-CM

## 2012-11-11 DIAGNOSIS — Z431 Encounter for attention to gastrostomy: Secondary | ICD-10-CM

## 2012-11-11 DIAGNOSIS — J384 Edema of larynx: Secondary | ICD-10-CM | POA: Diagnosis not present

## 2012-11-11 DIAGNOSIS — E669 Obesity, unspecified: Secondary | ICD-10-CM | POA: Diagnosis present

## 2012-11-11 DIAGNOSIS — Z930 Tracheostomy status: Secondary | ICD-10-CM

## 2012-11-11 DIAGNOSIS — A4902 Methicillin resistant Staphylococcus aureus infection, unspecified site: Secondary | ICD-10-CM | POA: Diagnosis present

## 2012-11-11 DIAGNOSIS — I509 Heart failure, unspecified: Secondary | ICD-10-CM | POA: Diagnosis present

## 2012-11-11 DIAGNOSIS — I5022 Chronic systolic (congestive) heart failure: Secondary | ICD-10-CM

## 2012-11-11 DIAGNOSIS — R0600 Dyspnea, unspecified: Secondary | ICD-10-CM

## 2012-11-11 DIAGNOSIS — F0391 Unspecified dementia with behavioral disturbance: Secondary | ICD-10-CM

## 2012-11-11 DIAGNOSIS — E870 Hyperosmolality and hypernatremia: Secondary | ICD-10-CM | POA: Diagnosis present

## 2012-11-11 DIAGNOSIS — L89109 Pressure ulcer of unspecified part of back, unspecified stage: Secondary | ICD-10-CM | POA: Diagnosis present

## 2012-11-11 DIAGNOSIS — J9600 Acute respiratory failure, unspecified whether with hypoxia or hypercapnia: Secondary | ICD-10-CM

## 2012-11-11 DIAGNOSIS — L8995 Pressure ulcer of unspecified site, unstageable: Secondary | ICD-10-CM | POA: Diagnosis present

## 2012-11-11 DIAGNOSIS — G4733 Obstructive sleep apnea (adult) (pediatric): Secondary | ICD-10-CM | POA: Diagnosis present

## 2012-11-11 DIAGNOSIS — K219 Gastro-esophageal reflux disease without esophagitis: Secondary | ICD-10-CM | POA: Diagnosis present

## 2012-11-11 DIAGNOSIS — F411 Generalized anxiety disorder: Secondary | ICD-10-CM

## 2012-11-11 DIAGNOSIS — Z8673 Personal history of transient ischemic attack (TIA), and cerebral infarction without residual deficits: Secondary | ICD-10-CM

## 2012-11-11 DIAGNOSIS — F0390 Unspecified dementia without behavioral disturbance: Secondary | ICD-10-CM | POA: Diagnosis present

## 2012-11-11 DIAGNOSIS — R6520 Severe sepsis without septic shock: Secondary | ICD-10-CM | POA: Diagnosis present

## 2012-11-11 DIAGNOSIS — Z9861 Coronary angioplasty status: Secondary | ICD-10-CM

## 2012-11-11 DIAGNOSIS — G9340 Encephalopathy, unspecified: Secondary | ICD-10-CM

## 2012-11-11 DIAGNOSIS — J0410 Acute tracheitis without obstruction: Secondary | ICD-10-CM

## 2012-11-11 DIAGNOSIS — R5381 Other malaise: Secondary | ICD-10-CM | POA: Diagnosis not present

## 2012-11-11 DIAGNOSIS — R509 Fever, unspecified: Secondary | ICD-10-CM

## 2012-11-11 DIAGNOSIS — I69959 Hemiplegia and hemiparesis following unspecified cerebrovascular disease affecting unspecified side: Secondary | ICD-10-CM

## 2012-11-11 DIAGNOSIS — R1310 Dysphagia, unspecified: Secondary | ICD-10-CM

## 2012-11-11 DIAGNOSIS — H168 Other keratitis: Secondary | ICD-10-CM | POA: Diagnosis present

## 2012-11-11 DIAGNOSIS — R269 Unspecified abnormalities of gait and mobility: Secondary | ICD-10-CM

## 2012-11-11 DIAGNOSIS — L8992 Pressure ulcer of unspecified site, stage 2: Secondary | ICD-10-CM | POA: Diagnosis present

## 2012-11-11 DIAGNOSIS — IMO0002 Reserved for concepts with insufficient information to code with codable children: Secondary | ICD-10-CM | POA: Diagnosis present

## 2012-11-11 DIAGNOSIS — N390 Urinary tract infection, site not specified: Secondary | ICD-10-CM

## 2012-11-11 DIAGNOSIS — Z66 Do not resuscitate: Secondary | ICD-10-CM | POA: Diagnosis not present

## 2012-11-11 DIAGNOSIS — R413 Other amnesia: Secondary | ICD-10-CM

## 2012-11-11 DIAGNOSIS — E785 Hyperlipidemia, unspecified: Secondary | ICD-10-CM | POA: Diagnosis present

## 2012-11-11 DIAGNOSIS — R9389 Abnormal findings on diagnostic imaging of other specified body structures: Secondary | ICD-10-CM

## 2012-11-11 DIAGNOSIS — E46 Unspecified protein-calorie malnutrition: Secondary | ICD-10-CM | POA: Diagnosis present

## 2012-11-11 DIAGNOSIS — E039 Hypothyroidism, unspecified: Secondary | ICD-10-CM | POA: Diagnosis present

## 2012-11-11 DIAGNOSIS — I5023 Acute on chronic systolic (congestive) heart failure: Secondary | ICD-10-CM

## 2012-11-11 DIAGNOSIS — E876 Hypokalemia: Secondary | ICD-10-CM | POA: Diagnosis not present

## 2012-11-11 DIAGNOSIS — B958 Unspecified staphylococcus as the cause of diseases classified elsewhere: Secondary | ICD-10-CM

## 2012-11-11 DIAGNOSIS — I959 Hypotension, unspecified: Secondary | ICD-10-CM

## 2012-11-11 DIAGNOSIS — Z7401 Bed confinement status: Secondary | ICD-10-CM

## 2012-11-11 DIAGNOSIS — Y833 Surgical operation with formation of external stoma as the cause of abnormal reaction of the patient, or of later complication, without mention of misadventure at the time of the procedure: Secondary | ICD-10-CM | POA: Diagnosis present

## 2012-11-11 DIAGNOSIS — J40 Bronchitis, not specified as acute or chronic: Secondary | ICD-10-CM | POA: Diagnosis not present

## 2012-11-11 DIAGNOSIS — J9620 Acute and chronic respiratory failure, unspecified whether with hypoxia or hypercapnia: Principal | ICD-10-CM

## 2012-11-11 DIAGNOSIS — D638 Anemia in other chronic diseases classified elsewhere: Secondary | ICD-10-CM

## 2012-11-11 DIAGNOSIS — J9601 Acute respiratory failure with hypoxia: Secondary | ICD-10-CM | POA: Diagnosis present

## 2012-11-11 DIAGNOSIS — D649 Anemia, unspecified: Secondary | ICD-10-CM | POA: Diagnosis present

## 2012-11-11 DIAGNOSIS — J189 Pneumonia, unspecified organism: Secondary | ICD-10-CM

## 2012-11-11 DIAGNOSIS — A419 Sepsis, unspecified organism: Secondary | ICD-10-CM

## 2012-11-11 DIAGNOSIS — N183 Chronic kidney disease, stage 3 unspecified: Secondary | ICD-10-CM

## 2012-11-11 DIAGNOSIS — I69920 Aphasia following unspecified cerebrovascular disease: Secondary | ICD-10-CM

## 2012-11-11 DIAGNOSIS — L89609 Pressure ulcer of unspecified heel, unspecified stage: Secondary | ICD-10-CM | POA: Diagnosis present

## 2012-11-11 DIAGNOSIS — K9422 Gastrostomy infection: Secondary | ICD-10-CM | POA: Diagnosis not present

## 2012-11-11 DIAGNOSIS — E0590 Thyrotoxicosis, unspecified without thyrotoxic crisis or storm: Secondary | ICD-10-CM

## 2012-11-11 DIAGNOSIS — Z86718 Personal history of other venous thrombosis and embolism: Secondary | ICD-10-CM

## 2012-11-11 LAB — POCT I-STAT 3, ART BLOOD GAS (G3+)
Acid-Base Excess: 5 mmol/L — ABNORMAL HIGH (ref 0.0–2.0)
Bicarbonate: 30.7 mEq/L — ABNORMAL HIGH (ref 20.0–24.0)
O2 Saturation: 96 %
Patient temperature: 102
pO2, Arterial: 95 mmHg (ref 80.0–100.0)

## 2012-11-11 LAB — URINE MICROSCOPIC-ADD ON

## 2012-11-11 LAB — CBC WITH DIFFERENTIAL/PLATELET
Basophils Relative: 0 % (ref 0–1)
Eosinophils Absolute: 0 10*3/uL (ref 0.0–0.7)
Hemoglobin: 14.1 g/dL (ref 12.0–15.0)
Lymphs Abs: 2.8 10*3/uL (ref 0.7–4.0)
MCH: 30.7 pg (ref 26.0–34.0)
MCHC: 30.3 g/dL (ref 30.0–36.0)
MCV: 101.3 fL — ABNORMAL HIGH (ref 78.0–100.0)
Monocytes Absolute: 0.6 10*3/uL (ref 0.1–1.0)
Neutro Abs: 8.7 10*3/uL — ABNORMAL HIGH (ref 1.7–7.7)
Neutrophils Relative %: 72 % (ref 43–77)

## 2012-11-11 LAB — COMPREHENSIVE METABOLIC PANEL
BUN: 52 mg/dL — ABNORMAL HIGH (ref 6–23)
CO2: 32 mEq/L (ref 19–32)
Calcium: 9.3 mg/dL (ref 8.4–10.5)
Creatinine, Ser: 0.69 mg/dL (ref 0.50–1.10)
GFR calc Af Amer: >90 mL/min (ref >90)
GFR calc non Af Amer: 78 mL/min — ABNORMAL LOW (ref >90)
Glucose, Bld: 273 mg/dL — ABNORMAL HIGH (ref 70–99)
Total Bilirubin: 0.5 mg/dL (ref 0.3–1.2)

## 2012-11-11 LAB — URINALYSIS, ROUTINE W REFLEX MICROSCOPIC
Bilirubin Urine: NEGATIVE
Glucose, UA: NEGATIVE mg/dL
Ketones, ur: NEGATIVE mg/dL
Nitrite: NEGATIVE
Protein, ur: 100 mg/dL — AB

## 2012-11-11 LAB — CG4 I-STAT (LACTIC ACID): Lactic Acid, Venous: 2.45 mmol/L — ABNORMAL HIGH (ref 0.5–2.2)

## 2012-11-11 LAB — POCT I-STAT TROPONIN I: Troponin i, poc: 0.05 ng/mL (ref 0.00–0.08)

## 2012-11-11 MED ORDER — CYANOCOBALAMIN 500 MCG PO TABS
500.0000 ug | ORAL_TABLET | Freq: Every day | ORAL | Status: DC
  Administered 2012-11-12 – 2012-12-04 (×23): 500 ug
  Administered 2012-12-05: 11:00:00
  Administered 2012-12-06 – 2012-12-12 (×7): 500 ug
  Filled 2012-11-11 (×32): qty 1

## 2012-11-11 MED ORDER — LEVOTHYROXINE SODIUM 88 MCG PO TABS
88.0000 ug | ORAL_TABLET | Freq: Every day | ORAL | Status: DC
  Administered 2012-11-12 – 2012-12-12 (×31): 88 ug
  Filled 2012-11-11 (×37): qty 1

## 2012-11-11 MED ORDER — JEVITY 1.5 CAL PO LIQD
1000.0000 mL | ORAL | Status: DC
  Administered 2012-11-12: 02:00:00
  Filled 2012-11-11: qty 1000

## 2012-11-11 MED ORDER — ETOMIDATE 2 MG/ML IV SOLN: 20.0000 mg | Freq: Once | INTRAVENOUS | Status: DC

## 2012-11-11 MED ORDER — SODIUM CHLORIDE 0.9 % IV BOLUS (SEPSIS)
500.0000 mL | Freq: Once | INTRAVENOUS | Status: AC
  Administered 2012-11-11: 500 mL via INTRAVENOUS

## 2012-11-11 MED ORDER — SIMVASTATIN 20 MG PO TABS
20.0000 mg | ORAL_TABLET | Freq: Every day | ORAL | Status: DC
  Administered 2012-11-12 – 2012-12-09 (×26): 20 mg via ORAL
  Filled 2012-11-11 (×29): qty 1

## 2012-11-11 MED ORDER — MIDAZOLAM HCL 2 MG/2ML IJ SOLN
INTRAMUSCULAR | Status: AC
  Filled 2012-11-11: qty 4

## 2012-11-11 MED ORDER — MIDAZOLAM HCL 2 MG/2ML IJ SOLN
4.0000 mg | Freq: Once | INTRAMUSCULAR | Status: AC
  Administered 2012-11-11: 4 mg via INTRAVENOUS

## 2012-11-11 MED ORDER — SODIUM CHLORIDE 0.9 % IV SOLN: 20.0000 ug/h | INTRAVENOUS | Status: DC

## 2012-11-11 MED ORDER — BIOTENE DRY MOUTH MT LIQD
1.0000 "application " | Freq: Four times a day (QID) | OROMUCOSAL | Status: DC
  Administered 2012-11-12 – 2012-12-12 (×118): 15 mL via OROMUCOSAL

## 2012-11-11 MED ORDER — DEXTROSE-NACL 5-0.45 % IV SOLN: INTRAVENOUS | Status: DC

## 2012-11-11 MED ORDER — FENTANYL CITRATE 0.05 MG/ML IJ SOLN: 100.0000 ug | INTRAMUSCULAR | Status: DC | PRN

## 2012-11-11 MED ORDER — VANCOMYCIN HCL IN DEXTROSE 1-5 GM/200ML-% IV SOLN
1000.0000 mg | Freq: Once | INTRAVENOUS | Status: AC
  Administered 2012-11-11: 1000 mg via INTRAVENOUS
  Filled 2012-11-11: qty 200

## 2012-11-11 MED ORDER — VITAMIN D3 25 MCG (1000 UNIT) PO TABS
2000.0000 [IU] | ORAL_TABLET | Freq: Every day | ORAL | Status: DC
  Administered 2012-11-12 – 2012-11-18 (×7): 2000 [IU] via ORAL
  Administered 2012-11-19: 1000 [IU] via ORAL
  Administered 2012-11-20 – 2012-12-12 (×23): 2000 [IU] via ORAL
  Filled 2012-11-11 (×32): qty 2

## 2012-11-11 MED ORDER — FREE WATER
100.0000 mL | Freq: Three times a day (TID) | Status: DC
Start: 2012-11-11 — End: 2012-11-11

## 2012-11-11 MED ORDER — PANTOPRAZOLE SODIUM 40 MG PO PACK
40.0000 mg | PACK | Freq: Every day | ORAL | Status: DC
  Administered 2012-11-12 – 2012-12-12 (×31): 40 mg
  Filled 2012-11-11 (×32): qty 20

## 2012-11-11 MED ORDER — SUCCINYLCHOLINE CHLORIDE 20 MG/ML IJ SOLN
INTRAMUSCULAR | Status: AC | PRN
  Administered 2012-11-11: 100 mg via INTRAVENOUS

## 2012-11-11 MED ORDER — FENTANYL CITRATE 0.05 MG/ML IJ SOLN
INTRAMUSCULAR | Status: AC
  Filled 2012-11-11: qty 2

## 2012-11-11 MED ORDER — ROCURONIUM BROMIDE 50 MG/5ML IV SOLN
INTRAVENOUS | Status: AC
  Filled 2012-11-11: qty 2

## 2012-11-11 MED ORDER — SUCCINYLCHOLINE CHLORIDE 20 MG/ML IJ SOLN
INTRAMUSCULAR | Status: AC
  Filled 2012-11-11: qty 1

## 2012-11-11 MED ORDER — SODIUM CHLORIDE 0.9 % IV BOLUS (SEPSIS)
1000.0000 mL | Freq: Once | INTRAVENOUS | Status: AC
  Administered 2012-11-11: 1000 mL via INTRAVENOUS

## 2012-11-11 MED ORDER — ACETAMINOPHEN 650 MG RE SUPP
650.0000 mg | RECTAL | Status: DC | PRN
  Administered 2012-11-11: 650 mg via RECTAL
  Filled 2012-11-11: qty 1

## 2012-11-11 MED ORDER — ETOMIDATE 2 MG/ML IV SOLN
INTRAVENOUS | Status: AC | PRN
  Administered 2012-11-11: 20 mg via INTRAVENOUS

## 2012-11-11 MED ORDER — CHLORHEXIDINE GLUCONATE 0.12 % MT SOLN
15.0000 mL | Freq: Two times a day (BID) | OROMUCOSAL | Status: DC
  Administered 2012-11-12: 15 mL via OROMUCOSAL

## 2012-11-11 MED ORDER — INSULIN ASPART 100 UNIT/ML ~~LOC~~ SOLN
0.0000 [IU] | SUBCUTANEOUS | Status: DC
  Administered 2012-11-12: 3 [IU] via SUBCUTANEOUS
  Administered 2012-11-12: 5 [IU] via SUBCUTANEOUS
  Administered 2012-11-12: 3 [IU] via SUBCUTANEOUS
  Administered 2012-11-12 – 2012-11-13 (×6): 2 [IU] via SUBCUTANEOUS
  Administered 2012-11-13: 3 [IU] via SUBCUTANEOUS
  Administered 2012-11-14 – 2012-11-15 (×5): 2 [IU] via SUBCUTANEOUS
  Administered 2012-11-15: 3 [IU] via SUBCUTANEOUS
  Administered 2012-11-15 – 2012-11-18 (×13): 2 [IU] via SUBCUTANEOUS
  Administered 2012-11-19: 5 [IU] via SUBCUTANEOUS
  Administered 2012-11-19: 0 [IU] via SUBCUTANEOUS
  Administered 2012-11-19: 5 [IU] via SUBCUTANEOUS
  Administered 2012-11-19 (×2): 2 [IU] via SUBCUTANEOUS
  Administered 2012-11-20: 3 [IU] via SUBCUTANEOUS
  Administered 2012-11-20: 5 [IU] via SUBCUTANEOUS
  Administered 2012-11-20 (×2): 3 [IU] via SUBCUTANEOUS
  Administered 2012-11-20 (×2): 5 [IU] via SUBCUTANEOUS
  Administered 2012-11-21 (×2): 2 [IU] via SUBCUTANEOUS
  Administered 2012-11-21: 17:00:00 via SUBCUTANEOUS
  Administered 2012-11-22: 3 [IU] via SUBCUTANEOUS
  Administered 2012-11-22 (×5): 2 [IU] via SUBCUTANEOUS
  Administered 2012-11-23: 3 [IU] via SUBCUTANEOUS
  Administered 2012-11-23: 2 [IU] via SUBCUTANEOUS
  Administered 2012-11-23: 3 [IU] via SUBCUTANEOUS
  Administered 2012-11-23: 2 [IU] via SUBCUTANEOUS
  Administered 2012-11-23 (×2): 3 [IU] via SUBCUTANEOUS
  Administered 2012-11-24: 2 [IU] via SUBCUTANEOUS

## 2012-11-11 MED ORDER — HEPARIN SODIUM (PORCINE) 5000 UNIT/ML IJ SOLN
5000.0000 [IU] | Freq: Three times a day (TID) | INTRAMUSCULAR | Status: DC
  Administered 2012-11-12 – 2012-11-24 (×37): 5000 [IU] via SUBCUTANEOUS
  Filled 2012-11-11 (×41): qty 1

## 2012-11-11 MED ORDER — PRO-STAT SUGAR FREE PO LIQD
30.0000 mL | Freq: Every day | ORAL | Status: DC
  Administered 2012-11-12: 30 mL via ORAL
  Filled 2012-11-11: qty 30

## 2012-11-11 MED ORDER — SUCCINYLCHOLINE CHLORIDE 20 MG/ML IJ SOLN
100.0000 mg | Freq: Once | INTRAMUSCULAR | Status: DC
  Filled 2012-11-11: qty 5

## 2012-11-11 MED ORDER — SODIUM CHLORIDE 0.9 % IV SOLN: 1.0000 mg/h | INTRAVENOUS | Status: DC

## 2012-11-11 MED ORDER — IBUPROFEN 100 MG/5ML PO SUSP
ORAL | Status: AC
  Filled 2012-11-11: qty 10

## 2012-11-11 MED ORDER — MIDAZOLAM HCL 2 MG/2ML IJ SOLN
4.0000 mg | Freq: Once | INTRAMUSCULAR | Status: AC
  Administered 2012-11-11: 4 mg via INTRAVENOUS
  Filled 2012-11-11 (×2): qty 2

## 2012-11-11 MED ORDER — ALBUTEROL SULFATE HFA 108 (90 BASE) MCG/ACT IN AERS: 6.0000 | INHALATION_SPRAY | RESPIRATORY_TRACT | Status: DC | PRN

## 2012-11-11 MED ORDER — FENTANYL CITRATE 0.05 MG/ML IJ SOLN: 50.0000 ug | Freq: Once | INTRAMUSCULAR | Status: DC

## 2012-11-11 MED ORDER — LIDOCAINE HCL (CARDIAC) 20 MG/ML IV SOLN
INTRAVENOUS | Status: AC
  Filled 2012-11-11: qty 5

## 2012-11-11 MED ORDER — ETOMIDATE 2 MG/ML IV SOLN
INTRAVENOUS | Status: AC
  Filled 2012-11-11: qty 20

## 2012-11-11 MED ORDER — IBUPROFEN 100 MG/5ML PO SUSP
800.0000 mg | Freq: Once | ORAL | Status: DC
  Administered 2012-11-11: 800 mg
  Filled 2012-11-11: qty 40

## 2012-11-11 MED ORDER — MEMANTINE HCL 5 MG PO TABS
5.0000 mg | ORAL_TABLET | Freq: Two times a day (BID) | ORAL | Status: DC
  Administered 2012-11-12 – 2012-12-12 (×61): 5 mg
  Filled 2012-11-11 (×66): qty 1

## 2012-11-11 MED ORDER — PIPERACILLIN-TAZOBACTAM 3.375 G IVPB
3.3750 g | Freq: Once | INTRAVENOUS | Status: AC
  Administered 2012-11-11: 3.375 g via INTRAVENOUS
  Filled 2012-11-11: qty 50

## 2012-11-11 MED ORDER — FREE WATER
200.0000 mL | Freq: Four times a day (QID) | Status: DC
  Administered 2012-11-11: 200 mL

## 2012-11-11 MED ORDER — LEVETIRACETAM 100 MG/ML PO SOLN
500.0000 mg | Freq: Two times a day (BID) | ORAL | Status: DC
  Administered 2012-11-12 (×3): 500 mg via ORAL
  Filled 2012-11-11 (×5): qty 5

## 2012-11-11 MED ORDER — FENTANYL CITRATE 0.05 MG/ML IJ SOLN
50.0000 ug | Freq: Once | INTRAMUSCULAR | Status: AC
  Administered 2012-11-11: 50 ug via INTRAVENOUS

## 2012-11-11 NOTE — ED Notes (Signed)
Phlebotomy at bedside.

## 2012-11-11 NOTE — ED Notes (Signed)
Resp at bedside

## 2012-11-11 NOTE — ED Notes (Signed)
Radiology at bedside

## 2012-11-11 NOTE — ED Provider Notes (Signed)
CSN: [id number]     Arrival date & time 11/11/12  2053 History     First MD Initiated Contact with Patient 11/11/12 2145     Chief Complaint  Patient presents with  . Code Sepsis   The history is provided by medical records, the nursing home and the EMS personnel. History limited by: Level V caveat: Altered mental status/dementia.   patient presents from the nursing home with altered level of consciousness as well as fever and tachycardia.  The patient has a history of stroke and associated aphasia.  She has a chronic indwelling Foley catheter as well as a PEG tube.  The patient presents as a full code.  I spoke with the patient's healthcare power of attorney he states the patient is a full code and she confirms this.  She understands the seriousness of her aunts illness.  She also has a history of coronary artery disease and seizure disorder.  Strip (stroke in April 2014 with residual right-sided hemiplegia dysphagia and aphasia  Past Medical History  Diagnosis Date  . Hyperthyroidism     had RAI-is on thyroid replacement, happened maybe this summer  . Coronary artery disease ?, before 2005    Had a catheterization per son by Dr. [name] in 1991 no reports in  [place].  note from 2005 mentions hx of coronary stent  . GERD (gastroesophageal reflux disease)   . Stroke 07/2012    Right hemiplegia, dysphagia, aphasia.  . Dementia   . Aphasia 07/2012  . Chronic systolic heart failure     07/2012 the EF was 15%  . Mural thrombus of cardiac apex   . Obesity   . IBS (irritable bowel syndrome)   . DVT (deep venous thrombosis) before 2005    occurred after trauma, treated for a while with Coumadin.   Marland Kitchen Dysphagia due to recent stroke 07/2012    ok'd for purree/pudding thick liquids but G tube placed by IR  . OSA (obstructive sleep apnea)     refused CPAP per notes.    Past Surgical History  Procedure Laterality Date  . Tee without cardioversion N/A 07/26/2012    Procedure: TRANSESOPHAGEAL  ECHOCARDIOGRAM (TEE);  Surgeon: Laurey Morale, MD;  Location: [facility] ENDOSCOPY;  Service: Cardiovascular;  Laterality: N/A;  . Gastric feeding tube  08/02/2012    placed in radiology   Family History  Problem Relation Age of Onset  . Acne Mother     died giving birth to 8th kid  . Dementia Father     died 49  . Acne Brother     died 2-3 yrs ago  . Cervical cancer Sister    History  Substance Use Topics  . Smoking status: Never Smoker   . Smokeless tobacco: Never Used  . Alcohol Use: No   OB History   Grav Para Term Preterm Abortions TAB SAB Ect Mult Living                 Review of Systems  Unable to perform ROS: Mental status change    Allergies  Hyoscyamine; Ivp dye; and Septra  Home Medications   Current Outpatient Rx  Name  Route  Sig  Dispense  Refill  . acetaminophen (TYLENOL) 325 MG tablet   Per Tube   Place 650 mg into feeding tube every 4 (four) hours as needed for pain or fever.         Marland Kitchen albuterol (PROVENTIL) (2.5 MG/3ML) 0.083% nebulizer solution   Nebulization  Take 2.5 mg by nebulization every 4 (four) hours as needed for wheezing.         Marland Kitchen antiseptic oral rinse (BIOTENE) LIQD   Mouth Rinse   15 mLs by Mouth Rinse route 2 (two) times daily.         . cholecalciferol (VITAMIN D) 1000 UNITS tablet   Per Tube   Place 2,000 Units into feeding tube daily.         . cyanocobalamin 500 MCG tablet   Per Tube   Place 500 mcg into feeding tube daily.         Marland Kitchen docusate (COLACE) 50 MG/5ML liquid   Oral   Take 100 mg by mouth 2 (two) times daily as needed (for constipation).         . feeding supplement (PRO-STAT SUGAR FREE 64) LIQD   Oral   Take 30 mLs by mouth daily.         . furosemide (LASIX) 40 MG tablet   Per Tube   Place 40 mg into feeding tube daily.         . heparin 5000 UNIT/ML injection   Subcutaneous   Inject 5,000 Units into the skin every 8 (eight) hours.         . lansoprazole (PREVACID) 30 MG capsule    Oral   Take 30 mg by mouth daily. Dissolve contents of 1 capsule in 10ml of water or juice         . levETIRAcetam (KEPPRA) 100 MG/ML solution   Oral   Take 500 mg by mouth 2 (two) times daily.         Marland Kitchen levothyroxine (SYNTHROID, LEVOTHROID) 88 MCG tablet   Per Tube   Place 88 mcg into feeding tube daily before breakfast.         . memantine (NAMENDA) 5 MG tablet   Per Tube   Place 5 mg into feeding tube 2 (two) times daily.          . Nutritional Supplements (FEEDING SUPPLEMENT, JEVITY 1.5 CAL,) LIQD   Per Tube   Place 1,000 mLs into feeding tube continuous. 65 ml/hr; on at 6am off at 2am      0   . polyethylene glycol (MIRALAX / GLYCOLAX) packet   Oral   Take 17 g by mouth 2 (two) times daily as needed (for constipation).         . pravastatin (PRAVACHOL) 40 MG tablet   Per Tube   Place 40 mg into feeding tube daily.         . Water For Irrigation, Sterile (FREE WATER) SOLN   Per Tube   Place 100 mLs into feeding tube every 8 (eight) hours.          BP 91/61  Pulse 105  Temp(Src) 102 F (38.9 C) (Rectal)  Resp 30  Ht 5\' 3"  (1.6 m)  SpO2 97% Physical Exam  Nursing note and vitals reviewed. Constitutional: She appears well-developed and well-nourished. No distress.  HENT:  Head: Normocephalic and atraumatic.  Eyes: EOM are normal.  Neck: Normal range of motion.  Cardiovascular: Regular rhythm and normal heart sounds.   Tachycardic  Pulmonary/Chest: Accessory muscle usage present. Tachypnea noted. She is in respiratory distress. She has no decreased breath sounds. She has no rhonchi.  Abdominal: Soft. She exhibits no distension.  Musculoskeletal: Normal range of motion.  Large blistered region to right heel that was debrider the bedside.  Serosanguineous fluid and old blood clot  or relieved.  Stage I skin breakdown on the sacral region  Neurological:  Does not follow simple commands.  Opens eyes wider to sternal rub  Skin: Skin is warm and dry.   Psychiatric: She has a normal mood and affect. Judgment normal.    ED Course   Procedures (including critical care time)  INTUBATION Performed by: Lyanne Co Required items: required blood products, implants, devices, and special equipment available Patient identity confirmed: provided demographic data and hospital-assigned identification number Time out: Immediately prior to procedure a "time out" was called to verify the correct patient, procedure, equipment, support staff and site/side marked as required. Indications: respiratory failure Intubation method: Glidescope Laryngoscopy  Preoxygenation: BVM Sedatives: Etomidate Paralytic: Succinylcholine Tube Size: 7.5 cuffed Post-procedure assessment: chest rise and ETCO2 monitor Breath sounds: equal and absent over the epigastrium Tube secured with: ETT holder Chest x-ray interpreted by radiologist and me. Chest x-ray findings: endotracheal tube in appropriate position Patient tolerated the procedure well with no immediate complications.  CRITICAL CARE Performed by: Lyanne Co Total critical care time: 35 Critical care time was exclusive of separately billable procedures and treating other patients. Critical care was necessary to treat or prevent imminent or life-threatening deterioration. Critical care was time spent personally by me on the following activities: development of treatment plan with patient and/or surrogate as well as nursing, discussions with consultants, evaluation of patient's response to treatment, examination of patient, obtaining history from patient or surrogate, ordering and performing treatments and interventions, ordering and review of laboratory studies, ordering and review of radiographic studies, pulse oximetry and re-evaluation of patient's condition.   Date: 11/11/2012  Rate: 130  Rhythm: Sinus tachycardia  QRS Axis: normal  Intervals: normal  ST/T Wave abnormalities: normal  Conduction  Disutrbances: none  Narrative Interpretation:   Old EKG Reviewed: Change from prior EKG.  Tachycardia is new     Labs Reviewed  CBC WITH DIFFERENTIAL - Abnormal; Notable for the following:    WBC 12.1 (*)    HCT 46.6 (*)    MCV 101.3 (*)    RDW 18.0 (*)    Neutro Abs 8.7 (*)    All other components within normal limits  COMPREHENSIVE METABOLIC PANEL - Abnormal; Notable for the following:    Sodium 163 (*)    Chloride 122 (*)    Glucose, Bld 273 (*)    BUN 52 (*)    Total Protein 8.5 (*)    Albumin 2.3 (*)    GFR calc non Af Amer 78 (*)    All other components within normal limits  CG4 I-STAT (LACTIC ACID) - Abnormal; Notable for the following:    Lactic Acid, Venous 2.45 (*)    All other components within normal limits  POCT I-STAT 3, BLOOD GAS (G3+) - Abnormal; Notable for the following:    pH, Arterial 7.346 (*)    pCO2 arterial 57.2 (*)    Bicarbonate 30.7 (*)    Acid-Base Excess 5.0 (*)    All other components within normal limits  CULTURE, BLOOD (ROUTINE X 2)  CULTURE, BLOOD (ROUTINE X 2)  URINALYSIS, ROUTINE W REFLEX MICROSCOPIC  POCT I-STAT TROPONIN I   Dg Chest Port 1 View  (if Code Sepsis Called)  11/11/2012   *RADIOLOGY REPORT*  Clinical Data: Sepsis  PORTABLE CHEST - 1 VIEW  Comparison: 10/24/2012  Findings: Low lung volumes persist.  Increased left perihilar and lower lobe streaky opacities compatible with atelectasis, slightly worse.  Developing small left effusion not excluded.  No right effusion.  No pneumothorax.  Exam is rotated to the left. Atherosclerosis of the aorta.  IMPRESSION: Cardiomegaly without CHF or edema  Increased left lower lobe atelectasis   Original Report Authenticated By: Judie Petit. Miles Costain, M.D.   I personally reviewed the imaging tests through PACS system I reviewed available ER/hospitalization records through the EMR   1. Acute respiratory failure with hypoxia   2. Fever   3. Sepsis     MDM  Patient presents with what appears to be acute  respiratory failure with sats around 85%.  She required 15 L nonrebreather mask her sats up to 94%.  She continued to breathe approximately 40 times a minute.  She remained tachycardic.  I spoke with family and they state that she is a full code.  The patient was intubated for airway protection.  On intubation the patient and a significant amount of mucus and other solid debris located in her posterior pharynx and overlying her larynx.  Much of this may represent an an obstructive process leading to her hypoxia as her initial chest x-ray was without significant issues.  This was suctioned out and the patient is intubated pass this without any significant difficulty.  Patient appears to be with sepsis with fever of 102 and tachycardia.  No hypertension.  Lactate is 2.45.  Blood cultures pending.  Vancomycin and Zosyn given.  Patient be admitted to the intensive care unit as a full code.  This a difficult situation and I discussed palliation with the family and I think this will need to be a continuing ongoing consultation/discussion   Lyanne Co, MD 11/11/12 2255

## 2012-11-11 NOTE — H&P (Signed)
PULMONARY  / CRITICAL CARE MEDICINE  Name: Jenny Brady MRN: [id number] DOB: [birth year]    ADMISSION DATE:  11/11/2012 CONSULTATION DATE:  11/11/2012  REFERRING MD :  EDP PRIMARY SERVICE:  PCCM  CHIEF COMPLAINT:  Acute respiratory failure  BRIEF PATIENT DESCRIPTION: 79 yo with past medical history of recent CVA with resulting R hemiplegia and aphagia, s/p PEG placement and chronic Foley cath brought to ED with altered mental status and hypoxia.  In ED intubate for airway protection / hypoxemia.  Large amount of pooled secretions noted obstructing oropharynx during intubation. R heel hematoma  debrided by EDP. PCCM was consulted.  SIGNIFICANT EVENTS / STUDIES:   LINES / TUBES: OETT 8/4 >>> Foley 8/4 >>>  CULTURES: 8/4 Blood >>> 8/4 Urine >>>  ANTIBIOTICS: Zosyn 8/4 >>> Vancomycin 8/4 >>>  The patient is encephalopathic and unable to provide history, which was obtained for available medical records.  HISTORY OF PRESENT ILLNESS:  79 yo with past medical history of recent CVA with resulting R hemiplegia and aphagia, s/p PEG placement and chronic Foley cath brought to ED with altered mental status and hypoxia.  In ED intubate for airway protection / hypoxemia.  Large amount of pooled secretions noted obstructing oropharynx during intubation. R heel hematoma  debrided by EDP. PCCM was consulted.  PAST MEDICAL HISTORY :  Past Medical History  Diagnosis Date  . Hyperthyroidism     had RAI-is on thyroid replacement, happened maybe this summer  . Coronary artery disease ?, before 2005    Had a catheterization per son by Dr. [name] in 1991 no reports in  [place].  note from 2005 mentions hx of coronary stent  . GERD (gastroesophageal reflux disease)   . Stroke 07/2012    Right hemiplegia, dysphagia, aphasia.  . Dementia   . Aphasia 07/2012  . Chronic systolic heart failure     07/2012 the EF was 15%  . Mural thrombus of cardiac apex   . Obesity   . IBS (irritable bowel syndrome)    . DVT (deep venous thrombosis) before 2005    occurred after trauma, treated for a while with Coumadin.   Marland Kitchen Dysphagia due to recent stroke 07/2012    ok'd for purree/pudding thick liquids but G tube placed by IR  . OSA (obstructive sleep apnea)     refused CPAP per notes.    Past Surgical History  Procedure Laterality Date  . Tee without cardioversion N/A 07/26/2012    Procedure: TRANSESOPHAGEAL ECHOCARDIOGRAM (TEE);  Surgeon: Laurey Morale, MD;  Location: [facility] ENDOSCOPY;  Service: Cardiovascular;  Laterality: N/A;  . Gastric feeding tube  08/02/2012    placed in radiology   Prior to Admission medications   Medication Sig Start Date End Date Taking? Authorizing Provider  acetaminophen (TYLENOL) 325 MG tablet Place 650 mg into feeding tube every 4 (four) hours as needed for pain or fever.   Yes Historical Provider, MD  albuterol (PROVENTIL) (2.5 MG/3ML) 0.083% nebulizer solution Take 2.5 mg by nebulization every 4 (four) hours as needed for wheezing.   Yes Historical Provider, MD  antiseptic oral rinse (BIOTENE) LIQD 15 mLs by Mouth Rinse route 2 (two) times daily. 10/25/12  Yes Renae Fickle, MD  cholecalciferol (VITAMIN D) 1000 UNITS tablet Place 2,000 Units into feeding tube daily. 08/06/12  Yes Ripudeep Jenna Luo, MD  cyanocobalamin 500 MCG tablet Place 500 mcg into feeding tube daily.   Yes Historical Provider, MD  docusate (COLACE) 50 MG/5ML liquid Take 100  mg by mouth 2 (two) times daily as needed (for constipation).   Yes Historical Provider, MD  feeding supplement (PRO-STAT SUGAR FREE 64) LIQD Take 30 mLs by mouth daily.   Yes Historical Provider, MD  furosemide (LASIX) 40 MG tablet Place 40 mg into feeding tube daily.   Yes Historical Provider, MD  heparin 5000 UNIT/ML injection Inject 5,000 Units into the skin every 8 (eight) hours.   Yes Historical Provider, MD  lansoprazole (PREVACID) 30 MG capsule Take 30 mg by mouth daily. Dissolve contents of 1 capsule in 10ml of water or juice    Yes Historical Provider, MD  levETIRAcetam (KEPPRA) 100 MG/ML solution Take 500 mg by mouth 2 (two) times daily.   Yes Historical Provider, MD  levothyroxine (SYNTHROID, LEVOTHROID) 88 MCG tablet Place 88 mcg into feeding tube daily before breakfast.   Yes Historical Provider, MD  memantine (NAMENDA) 5 MG tablet Place 5 mg into feeding tube 2 (two) times daily.  04/06/11  Yes Kathlen Mody, MD  Nutritional Supplements (FEEDING SUPPLEMENT, JEVITY 1.5 CAL,) LIQD Place 1,000 mLs into feeding tube continuous. 65 ml/hr; on at 6am off at 2am 10/25/12  Yes Renae Fickle, MD  polyethylene glycol (MIRALAX / GLYCOLAX) packet Take 17 g by mouth 2 (two) times daily as needed (for constipation).   Yes Historical Provider, MD  pravastatin (PRAVACHOL) 40 MG tablet Place 40 mg into feeding tube daily.   Yes Historical Provider, MD  Water For Irrigation, Sterile (FREE WATER) SOLN Place 100 mLs into feeding tube every 8 (eight) hours. 10/25/12  Yes Renae Fickle, MD   Allergies  Allergen Reactions  . Hyoscyamine Other (See Comments)    Per MAR  . Ivp Dye (Iodinated Diagnostic Agents) Other (See Comments)    Per MAR  . Septra (Sulfamethoxazole W-Trimethoprim) Other (See Comments)    Per MAR   FAMILY HISTORY:  Family History  Problem Relation Age of Onset  . Acne Mother     died giving birth to 8th kid  . Dementia Father     died 26  . Acne Brother     died 2-3 yrs ago  . Cervical cancer Sister    SOCIAL HISTORY:  reports that she has never smoked. She has never used smokeless tobacco. She reports that she does not drink alcohol or use illicit drugs.  REVIEW OF SYSTEMS:  Unable to provide.  INTERVAL HISTORY:  VITAL SIGNS: Temp:  [98.7 F (37.1 C)-102 F (38.9 C)] 98.7 F (37.1 C) (08/04 2329) Pulse Rate:  [60-145] 94 (08/04 2315) Resp:  [13-42] 19 (08/04 2315) BP: (80-111)/(41-80) 84/53 mmHg (08/04 2315) SpO2:  [91 %-100 %] 96 % (08/04 2315) FiO2 (%):  [70 %] 70 % (08/04  2238) HEMODYNAMICS:   VENTILATOR SETTINGS: Vent Mode:  [-] PRVC FiO2 (%):  [70 %] 70 % Set Rate:  [14 bmp] 14 bmp Vt Set:  [500 mL] 500 mL PEEP:  [5 cmH20] 5 cmH20  INTAKE / OUTPUT: Intake/Output     08/04 0701 - 08/05 0700   I.V. 3050   Total Intake 3050   Net +3050         PHYSICAL EXAMINATION: General:  Mechanically ventilated, synchronous Neuro:  Well sedated, cough / gag diminished HEENT:  PERRL, OETT, poor oral hygiene  Cardiovascular:  RRR, no m/r/g Lungs:  Bilateral diminished air entry, no w/r/r Abdomen:  Soft, nontender, bowel sounds diminished, PEG site intact, chronic Foley Musculoskeletal:  Trace edema BL LE Skin:  Debrided hematoma R heel  LABS:  Recent Labs Lab 11/11/12 2120 11/11/12 2142 11/11/12 2213  HGB 14.1  --   --   WBC 12.1*  --   --   PLT 262  --   --   NA 163*  --   --   K 3.8  --   --   CL 122*  --   --   CO2 32  --   --   GLUCOSE 273*  --   --   BUN 52*  --   --   CREATININE 0.69  --   --   CALCIUM 9.3  --   --   AST 20  --   --   ALT 10  --   --   ALKPHOS 67  --   --   BILITOT 0.5  --   --   PROT 8.5*  --   --   ALBUMIN 2.3*  --   --   LATICACIDVEN  --  2.45*  --   PHART  --   --  7.346*  PCO2ART  --   --  57.2*  PO2ART  --   --  95.0   No results found for this basename: GLUCAP,  in the last 168 hours  CXR:  8/4 >>> ETT good position, R basal atelectasis   ASSESSMENT / PLAN:  PULMONARY A:  Acute respiratory failure likely secondary to obstruction of oropharynx with secretions. P:   Gaol SpO2>92, pH>7.30 Full mechanical support Daily SBT Trend ABG / CXR Albuterol PRN Likely needs early tracheostomy of aggressive care is desired  CARDIOVASCULAR A: Transient hypotension in setting of dehydration / sedation / positive pressure ventilation. P:  Goal MAP>60 Preadmission Zocor  RENAL A:  Dehydration.  Hypernatremia. P:   Trend BMP Hold Lasix Received 2.5 L NS in ED, give additional 1 L Increase free water to  200 q6h  GASTROINTESTINAL A:  Malnutrition.  Dysphagia.  S/p PEG. P:   Continue preadmission TF and supplements Continue preadmission Protonix  HEMATOLOGIC A:  No active issues. P:  Trend CBC Heparin for DVT Px  INFECTIOUS A:  No clear infectious source. P:   Replace chronic Foley Cultures and antibiotics as above PCT  ENDOCRINE  A:  Hyperglycemia.  Hypothyroidism. P:   ICU Glycemic Control Protocol Continue Levothyroxine   NEUROLOGIC A:  H/o CVA, dementia, seizures. Acute encephalopathy. P:   Goal RASS 0 to -1 Fentanyl PRN Continue Namenda, Keppra  TODAY'S SUMMARY: 79 yo NH patient with CVA / dysphagia / PEG brought in with severe dehydration and respiratory distress likely due to poorly managed oral secretions.  Intubated.  Empirical abx started.  May need early tracheostomy if aggressive care is desired.  I have personally obtained a history, examined the patient, evaluated laboratory and imaging results, formulated the assessment and plan and placed orders.  CRITICAL CARE:  The patient is critically ill with multiple organ systems failure and requires high complexity decision making for assessment and support, frequent evaluation and titration of therapies, application of advanced monitoring technologies and extensive interpretation of multiple databases. Critical Care Time devoted to patient care services described in this note is 45 minutes.   Lonia Farber, MD Pulmonary and Critical Care Medicine [facility] Pager: [phone]  11/11/2012, 11:30 PM

## 2012-11-11 NOTE — ED Notes (Signed)
Xeroform petroleum dressing applied to blister on right heel.

## 2012-11-11 NOTE — ED Notes (Signed)
EKG discontinued as it is a duplicate order

## 2012-11-11 NOTE — ED Notes (Signed)
Foley catheter bag changed to our facility's bag from the nursing home facility's bag. Unable to draw off a urine specimen on nursing home facility's foley bag.

## 2012-11-11 NOTE — ED Notes (Signed)
Suture cart at bedside. Blister filled with blood drained by MD.

## 2012-11-11 NOTE — ED Notes (Signed)
Antibiotics held until blood cultures drawn.

## 2012-11-11 NOTE — ED Notes (Signed)
Pt placed on a NRB, pt's 02 sat dropped to 81% on 4L 02 [place]. Pt 02 sats 94% on NRB.

## 2012-11-11 NOTE — ED Notes (Signed)
Per EMS pt is from [facility]. Per EMS pt's niece was at the facility upon arrival and discussed the pt's LOC has declined significantly in the past week. Niece of pt reported to EMS that pt has been septic in the past. EMS reports has a foley catheter and pt has peg tube in her upper abdomen. EMS pt had a stroke in April of this year. Pt disoriented X4, with sternal rub pt blinked. Per EMS pt is aphasic and does not move her eyes. Per EMS pt is a full code. EMS unable to obtain IV access en route.

## 2012-11-12 ENCOUNTER — Inpatient Hospital Stay (HOSPITAL_COMMUNITY): Payer: Medicare Other

## 2012-11-12 DIAGNOSIS — J189 Pneumonia, unspecified organism: Secondary | ICD-10-CM

## 2012-11-12 DIAGNOSIS — N390 Urinary tract infection, site not specified: Secondary | ICD-10-CM

## 2012-11-12 DIAGNOSIS — A419 Sepsis, unspecified organism: Secondary | ICD-10-CM

## 2012-11-12 LAB — URINALYSIS, ROUTINE W REFLEX MICROSCOPIC
Nitrite: NEGATIVE
Protein, ur: 30 mg/dL — AB
Specific Gravity, Urine: 1.034 — ABNORMAL HIGH (ref 1.005–1.030)
Urobilinogen, UA: 1 mg/dL (ref 0.0–1.0)

## 2012-11-12 LAB — URINE MICROSCOPIC-ADD ON

## 2012-11-12 LAB — BLOOD GAS, ARTERIAL
Bicarbonate: 25.2 mEq/L — ABNORMAL HIGH (ref 20.0–24.0)
Drawn by: 39899
O2 Saturation: 99.2 %
PEEP: 5 cmH2O
RATE: 14 resp/min
pCO2 arterial: 39.3 mmHg (ref 35.0–45.0)
pO2, Arterial: 156 mmHg — ABNORMAL HIGH (ref 80.0–100.0)

## 2012-11-12 LAB — BASIC METABOLIC PANEL
BUN: 49 mg/dL — ABNORMAL HIGH (ref 6–23)
BUN: 47 mg/dL — ABNORMAL HIGH (ref 6–23)
BUN: 46 mg/dL — ABNORMAL HIGH (ref 6–23)
CO2: 26 mEq/L (ref 19–32)
CO2: 23 mEq/L (ref 19–32)
Chloride: 128 mEq/L — ABNORMAL HIGH (ref 96–112)
Chloride: 129 mEq/L — ABNORMAL HIGH (ref 96–112)
Chloride: >130 mEq/L (ref 96–112)
GFR calc Af Amer: >90 mL/min (ref >90)
GFR calc non Af Amer: 84 mL/min — ABNORMAL LOW (ref >90)
GFR calc non Af Amer: 85 mL/min — ABNORMAL LOW (ref >90)
Glucose, Bld: 213 mg/dL — ABNORMAL HIGH (ref 70–99)
Glucose, Bld: 167 mg/dL — ABNORMAL HIGH (ref 70–99)
Glucose, Bld: 140 mg/dL — ABNORMAL HIGH (ref 70–99)
Potassium: 2.6 mEq/L — CL (ref 3.5–5.1)
Potassium: 4 mEq/L (ref 3.5–5.1)
Potassium: 4.8 mEq/L (ref 3.5–5.1)
Sodium: 163 mEq/L (ref 135–145)
Sodium: 159 mEq/L — ABNORMAL HIGH (ref 135–145)
Sodium: 161 mEq/L (ref 135–145)

## 2012-11-12 LAB — GLUCOSE, CAPILLARY
Glucose-Capillary: 124 mg/dL — ABNORMAL HIGH (ref 70–99)
Glucose-Capillary: 134 mg/dL — ABNORMAL HIGH (ref 70–99)
Glucose-Capillary: 186 mg/dL — ABNORMAL HIGH (ref 70–99)
Glucose-Capillary: 249 mg/dL — ABNORMAL HIGH (ref 70–99)

## 2012-11-12 LAB — MAGNESIUM: Magnesium: 2.3 mg/dL (ref 1.5–2.5)

## 2012-11-12 LAB — PROCALCITONIN: Procalcitonin: 0.24 ng/mL

## 2012-11-12 LAB — MRSA PCR SCREENING: MRSA by PCR: NEGATIVE

## 2012-11-12 MED ORDER — PRO-STAT SUGAR FREE PO LIQD
30.0000 mL | Freq: Four times a day (QID) | ORAL | Status: DC
  Administered 2012-11-12 – 2012-11-14 (×5): 30 mL
  Filled 2012-11-12 (×10): qty 30

## 2012-11-12 MED ORDER — DEXTROSE 5 % IV SOLN
INTRAVENOUS | Status: DC
  Administered 2012-11-12: 21:00:00 via INTRAVENOUS

## 2012-11-12 MED ORDER — FREE WATER
200.0000 mL | Status: DC
  Administered 2012-11-12 – 2012-11-13 (×7): 200 mL

## 2012-11-12 MED ORDER — ADULT MULTIVITAMIN W/MINERALS CH
1.0000 | ORAL_TABLET | Freq: Every day | ORAL | Status: DC
  Administered 2012-11-12 – 2012-12-09 (×28): 1
  Filled 2012-11-12 (×30): qty 1

## 2012-11-12 MED ORDER — SODIUM CHLORIDE 0.9 % IV BOLUS (SEPSIS)
750.0000 mL | Freq: Once | INTRAVENOUS | Status: AC
  Administered 2012-11-12: 750 mL via INTRAVENOUS

## 2012-11-12 MED ORDER — JEVITY 1.2 CAL PO LIQD: 1000.0000 mL | ORAL | Status: DC

## 2012-11-12 MED ORDER — FENTANYL CITRATE 0.05 MG/ML IJ SOLN
25.0000 ug | INTRAMUSCULAR | Status: DC | PRN
  Administered 2012-11-13 – 2012-11-17 (×5): 25 ug via INTRAVENOUS
  Administered 2012-11-19: 100 ug via INTRAVENOUS
  Administered 2012-11-19 – 2012-11-20 (×3): 25 ug via INTRAVENOUS
  Filled 2012-11-12: qty 2
  Filled 2012-11-12: qty 4
  Filled 2012-11-12 (×6): qty 2

## 2012-11-12 MED ORDER — POTASSIUM CHLORIDE 10 MEQ/50ML IV SOLN: 10.0000 meq | INTRAVENOUS | Status: DC

## 2012-11-12 MED ORDER — SODIUM CHLORIDE 0.9 % IV BOLUS (SEPSIS)
500.0000 mL | Freq: Once | INTRAVENOUS | Status: AC
  Administered 2012-11-12: 500 mL via INTRAVENOUS

## 2012-11-12 MED ORDER — POTASSIUM CHLORIDE 10 MEQ/100ML IV SOLN
INTRAVENOUS | Status: AC
  Filled 2012-11-12: qty 200

## 2012-11-12 MED ORDER — PHENYLEPHRINE HCL 10 MG/ML IJ SOLN
30.0000 ug/min | INTRAVENOUS | Status: DC
  Administered 2012-11-12: 30 ug/min via INTRAVENOUS
  Filled 2012-11-12: qty 1

## 2012-11-12 MED ORDER — POTASSIUM CHLORIDE 20 MEQ/15ML (10%) PO LIQD
40.0000 meq | ORAL | Status: AC
  Administered 2012-11-12 (×2): 40 meq
  Filled 2012-11-12: qty 15
  Filled 2012-11-12: qty 30
  Filled 2012-11-12: qty 15

## 2012-11-12 MED ORDER — CHLORHEXIDINE GLUCONATE 0.12 % MT SOLN
15.0000 mL | Freq: Two times a day (BID) | OROMUCOSAL | Status: DC
  Administered 2012-11-12 – 2012-12-12 (×61): 15 mL via OROMUCOSAL
  Filled 2012-11-12 (×62): qty 15

## 2012-11-12 MED ORDER — JEVITY 1.2 CAL PO LIQD
1000.0000 mL | ORAL | Status: DC
  Administered 2012-11-12 – 2012-11-14 (×2): 1000 mL
  Filled 2012-11-12 (×5): qty 1000

## 2012-11-12 MED ORDER — PRO-STAT SUGAR FREE PO LIQD: 30.0000 mL | Freq: Every day | ORAL | Status: DC

## 2012-11-12 MED ORDER — SODIUM CHLORIDE 0.45 % IV SOLN
INTRAVENOUS | Status: DC
  Administered 2012-11-12: 16:00:00 via INTRAVENOUS

## 2012-11-12 NOTE — Care Management Note (Signed)
    Page 1 of 1   11/12/2012     8:29:10 AM   CARE MANAGEMENT NOTE 11/12/2012  Patient:  Jenny Brady, Jenny Brady   Account Number:  [id number]  Date Initiated:  11/12/2012  Documentation initiated by:  [facility]  Subjective/Objective Assessment:   AMS - intubated.     Action/Plan:   Anticipated DC Date:  11/19/2012   Anticipated DC Plan:  SKILLED NURSING FACILITY  In-house referral  Clinical Social Worker      DC Planning Services  CM consult      Choice offered to / List presented to:             Status of service:  In process, will continue to follow Medicare Important Message given?   (If response is "NO", the following Medicare IM given date fields will be blank) Date Medicare IM given:   Date Additional Medicare IM given:    Discharge Disposition:    Per UR Regulation:  Reviewed for med. necessity/level of care/duration of stay  If discussed at Long Length of Stay Meetings, dates discussed:    Comments:  ContactPhilisha, We[place] Niece [phone]     POA - [phone]                 Contongiannis,Liz Niece [phone]                 Amprazias,James Son [phone]

## 2012-11-12 NOTE — Progress Notes (Signed)
INITIAL NUTRITION ASSESSMENT  DOCUMENTATION CODES Per approved criteria  -Not Applicable   INTERVENTION:  1. Initiate Jevity 1.2 @ 25 ml/hr via PEG (goal rate)  2. 30 ml Prostat QID.    3. MVI daily  At goal rate, tube feeding regimen will provide 1120 kcal (96% of needs), 87 grams of protein, and 1167 ml of H2O.    NUTRITION DIAGNOSIS: Inadequate oral intake related to inability to eat as evidenced by NPO status.  Goal: Pt to meet >/= 90% of their estimated nutrition needs   Monitor:  Vent status, TF tolerance, weight trend, labs   Reason for Assessment: Consult received to initiate and manage enteral nutrition support.  79 y.o. female  Admitting Dx: Acute respiratory failure  ASSESSMENT: Pt with past medical history of recent CVA with resulting R hemiplegia and aphagia, s/p PEG placement (07/2012) and chronic Foley cath brought to ED with altered mental status and hypoxia. Pt with severe dehydration with Na 163. Potassium being replaced.  Patient is currently intubated on ventilator support.  Pt with documented 4% weight loss x 1 month. Question if pt has continued to lose weight at SNF despite PEG providing enteral nutrition. Pt at very high risk of malnutrition due to possible weight loss and poor skin integrity.  MV: 7 L/min Temp:Temp (24hrs), Avg:97.4 F (36.3 C), Min:94.9 F (34.9 C), Max:102 F (38.9 C)  Patient has PEG in place. Jevity 1.5 is infusing @ 20 ml/hr. 30 ml Prostat via tube daily. Tube feeding regimen currently providing 820 kcal, 45 grams protein, and 365 ml H2O.   Free water flushes: 200 ml every 4 hrs providing 1200 of free water.  Total free water: 1565 ml per day.  Residuals: none  Nutrition Focused Physical Exam:  Subcutaneous Fat:  Orbital Region: WNL Upper Arm Region: WNL Thoracic and Lumbar Region: WNL  Muscle:  Temple Region: WNL Clavicle Bone Region: WNL Clavicle and Acromion Bone Region: WNL Scapular Bone Region: WNL Dorsal  Hand: WNL Patellar Region: WNL Anterior Thigh Region: WNL Posterior Calf Region: WNL  Edema: present 1+   Height: Ht Readings from Last 1 Encounters:  11/12/12 5\' 3"  (1.6 m)    Weight: Wt Readings from Last 1 Encounters:  11/12/12 159 lb 13.3 oz (72.5 kg)    Ideal Body Weight: 52.2 kg   % Ideal Body Weight: 139%  Wt Readings from Last 10 Encounters:  11/12/12 159 lb 13.3 oz (72.5 kg)  10/24/12 154 lb 8 oz (70.081 kg)  09/24/12 176 lb 9.4 oz (80.1 kg)  09/24/12 176 lb 9.4 oz (80.1 kg)  08/15/12 164 lb (74.39 kg)  08/13/12 177 lb 4 oz (80.4 kg)  08/13/12 177 lb 4 oz (80.4 kg)  04/06/11 168 lb 10.4 oz (76.5 kg)  02/19/08 177 lb (80.287 kg)  09/28/07 175 lb (79.379 kg)    Usual Body Weight: 177 lb 08/2012; 165 lb 7/14  % Usual Body Weight: 90%  BMI:  Body mass index is 28.32 kg/(m^2).  Estimated Nutritional Needs: Kcal: 1171 Protein: >/= 87 grams Fluid: > 1.5 L/day  Skin: stage II sacrum, DTI on right heel, DTI on right shoulder  Diet Order: NPO  EDUCATION NEEDS: -No education needs identified at this time   Intake/Output Summary (Last 24 hours) at 11/12/12 1146 Last data filed at 11/12/12 1100  Gross per 24 hour  Intake 4626.67 ml  Output    200 ml  Net 4426.67 ml    Last BM: PTA   Labs:  Recent Labs Lab 11/11/12 2120 11/12/12 0350  NA 163* 163*  K 3.8 2.6*  CL 122* 128*  CO2 32 26  BUN 52* 49*  CREATININE 0.69 0.63  CALCIUM 9.3 8.0*  MG  --  2.3  PHOS  --  2.8  GLUCOSE 273* 213*    CBG (last 3)   Recent Labs  11/12/12 0026 11/12/12 0355 11/12/12 0756  GLUCAP 249* 186* 134*   Lab Results  Component Value Date   HGBA1C 6.2* 10/19/2012   Scheduled Meds: . antiseptic oral rinse  1 application Mouth Rinse QID  . chlorhexidine  15 mL Mouth/Throat BID  . cholecalciferol  2,000 Units Oral Daily  . cyanocobalamin  500 mcg Per Tube Daily  . feeding supplement  30 mL Oral Daily  . free water  200 mL Per Tube Q4H  . heparin   5,000 Units Subcutaneous Q8H  . insulin aspart  0-15 Units Subcutaneous Q4H  . levETIRAcetam  500 mg Oral BID  . levothyroxine  88 mcg Per Tube QAC breakfast  . memantine  5 mg Per Tube BID  . pantoprazole sodium  40 mg Per Tube Daily  . simvastatin  20 mg Oral q1800    Continuous Infusions: . feeding supplement (JEVITY 1.5 CAL) 20 mL/hr at 11/12/12 0210    Past Medical History  Diagnosis Date  . Hyperthyroidism     had RAI-is on thyroid replacement, happened maybe this summer  . Coronary artery disease ?, before 2005    Had a catheterization per son by Dr. [name] in 1991 no reports in  [place].  note from 2005 mentions hx of coronary stent  . GERD (gastroesophageal reflux disease)   . Stroke 07/2012    Right hemiplegia, dysphagia, aphasia.  . Dementia   . Aphasia 07/2012  . Chronic systolic heart failure     07/2012 the EF was 15%  . Mural thrombus of cardiac apex   . Obesity   . IBS (irritable bowel syndrome)   . DVT (deep venous thrombosis) before 2005    occurred after trauma, treated for a while with Coumadin.   Marland Kitchen Dysphagia due to recent stroke 07/2012    ok'd for purree/pudding thick liquids but G tube placed by IR  . OSA (obstructive sleep apnea)     refused CPAP per notes.     Past Surgical History  Procedure Laterality Date  . Tee without cardioversion N/A 07/26/2012    Procedure: TRANSESOPHAGEAL ECHOCARDIOGRAM (TEE);  Surgeon: Laurey Morale, MD;  [facility] END[facility]COPY;  Service: Cardiovascular;  Laterality: N/A;  . Gastric feeding tube  08/02/2012    placed in radiology    Kendell Bane RD, LDN, CNSC [phone] Pager [phone] After Hours Pager

## 2012-11-12 NOTE — Progress Notes (Signed)
eLink Physician-Brief Progress Note Patient Name: Jenny Brady DOB: 07-Nov-1925 MRN: [id number]  Date of Service  11/12/2012   HPI/Events of Note  Hypernatremia and hypokalemia  eICU Interventions  Free water via tube increased and potassium replaced   Intervention Category Intermediate Interventions: Electrolyte abnormality - evaluation and management  Shelena Castelluccio 11/12/2012, 4:49 AM

## 2012-11-12 NOTE — Progress Notes (Signed)
Patient ID: Jenny Brady, female   DOB: September 30, 1925, 79 y.o.   MRN: [id number] HYPOTENSION BOLUS STILL ACTIVE GOING IN IN gETTING HYEPRNATREMIA ADD NEO FO RNOW, REPEAT LACTIC ACID  Mcarthur Rossetti. Tyson Alias, MD, FACP Pgr: [phone] [facility]

## 2012-11-12 NOTE — Progress Notes (Signed)
PULMONARY  / CRITICAL CARE MEDICINE  Name: Jenny Brady MRN: [id number] DOB: [birth year]    ADMISSION DATE:  11/11/2012 CONSULTATION DATE:  11/11/2012  REFERRING MD :  EDP PRIMARY SERVICE:  PCCM  CHIEF COMPLAINT:  Acute respiratory failure  BRIEF PATIENT DESCRIPTION: 79 yo with past medical history of recent CVA with resulting R hemiplegia and aphagia, s/p PEG placement and chronic Foley cath brought to ED with altered mental status and hypoxia.  In ED intubate for airway protection / hypoxemia.  Large amount of pooled secretions noted obstructing oropharynx during intubation. R heel hematoma debrided by EDP. Metabolic disarray.  PCCM was consulted.  SIGNIFICANT EVENTS / STUDIES:   LINES / TUBES: OETT 8/4 >>> Foley 8/4 >>>  CULTURES: 8/4 Blood >>> 8/4 Urine >>>  ANTIBIOTICS: Zosyn 8/4 >>> Vancomycin 8/4 >>>   HISTORY OF PRESENT ILLNESS:  79 yo with past medical history of recent CVA with resulting R hemiplegia and aphagia, s/p PEG placement and chronic Foley cath brought to ED with altered mental status and hypoxia.  In ED intubate for airway protection / hypoxemia.  Large amount of pooled secretions noted obstructing oropharynx during intubation. R heel hematoma  debrided by EDP. PCCM was consulted.   INTERVAL HISTORY:  VITAL SIGNS: Temp:  [94.9 F (34.9 C)-102 F (38.9 C)] 94.9 F (34.9 C) (08/05 0800) Pulse Rate:  [60-145] 83 (08/05 0900) Resp:  [13-42] 14 (08/05 1000) BP: (71-111)/(21-80) 92/45 mmHg (08/05 1000) SpO2:  [91 %-100 %] 98 % (08/05 1000) FiO2 (%):  [40 %-70 %] 40 % (08/05 1000) Weight:  [72.5 kg (159 lb 13.3 oz)] 72.5 kg (159 lb 13.3 oz) (08/05 0459) HEMODYNAMICS:   VENTILATOR SETTINGS: Vent Mode:  [-] PRVC FiO2 (%):  [40 %-70 %] 40 % Set Rate:  [14 bmp] 14 bmp Vt Set:  [500 mL] 500 mL PEEP:  [5 cmH20] 5 cmH20 Plateau Pressure:  [18 cmH20-22 cmH20] 22 cmH20  INTAKE / OUTPUT: Intake/Output     08/04 0701 - 08/05 0700 08/05 0701 - 08/06 0700    I.V. (mL/kg) 3050 (42.1) 1000 (13.8)   NG/GT 296.7 240   Total Intake(mL/kg) 3346.7 (46.2) 1240 (17.1)   Urine (mL/kg/hr) 80 40 (0.1)   Total Output 80 40   Net +3266.7 +1200          PHYSICAL EXAMINATION: General:  Mechanically ventilated, synchronous Neuro:  Well sedated, cough / gag diminished HEENT:  PERRL, OETT, poor oral hygiene  Cardiovascular:  RRR, no m/r/g Lungs:  Bilateral diminished air entry, no w/r/r Abdomen:  Soft, nontender, bowel sounds diminished, PEG site intact, chronic Foley Musculoskeletal:  Trace edema BL LE Skin:  Debrided hematoma R heel  LABS:  Recent Labs Lab 11/11/12 2120 11/12/12 0350  NA 163* 163*  K 3.8 2.6*  CL 122* 128*  CO2 32 26  GLUCOSE 273* 213*  BUN 52* 49*  CREATININE 0.69 0.63  CALCIUM 9.3 8.0*  MG  --  2.3  PHOS  --  2.8    Recent Labs Lab 11/11/12 2120 11/12/12 0350  HGB 14.1 10.6*  HCT 46.6* 35.9*  WBC 12.1* 8.7  PLT 262 176    Recent Labs Lab 11/11/12 2120  AST 20  ALT 10  ALKPHOS 67  BILITOT 0.5  PROT 8.5*  ALBUMIN 2.3*    Recent Labs Lab 11/11/12 2213 11/12/12 0105  PHART 7.346* 7.422  PCO2ART 57.2* 39.3  PO2ART 95.0 156.0*  HCO3 30.7* 25.2*  TCO2 32 26.4  O2SAT 96.0 99.2  Recent Labs Lab 11/12/12 0026 11/12/12 0355 11/12/12 0756  GLUCAP 249* 186* 134*    CXR:  8/5 >>> ETT good position, R basal atelectasis   ASSESSMENT / PLAN:  PULMONARY A:  Acute respiratory failure likely secondary to obstruction of oropharynx with secretions. P:   Full mechanical support Daily SBT, suspect no extubation until metabolic status corrected Albuterol PRN  CARDIOVASCULAR A: Transient hypotension in setting of dehydration / sedation / positive pressure ventilation. P:  Goal MAP>60, supporting with IVF Preadmission Zocor ordered  RENAL A:  Dehydration.   Hypernatremia. P:   Trend BMP q6h Hold Lasix Correct Na slowly, goal ~150 in 24h Increased free water to 200 q6h on  admission  GASTROINTESTINAL A:  Malnutrition.   Dysphagia, S/p PEG. P:   Continue preadmission TF and supplements Continue preadmission Protonix  HEMATOLOGIC A:  No active issues. P:  Trend CBC Heparin for DVT Px  INFECTIOUS A:  No clear infectious source. Pct 0.24 on 8/5 P:   Replaced chronic Foley 8/4 Cultures and empiric antibiotics as above, d/c on 8/6 if all data reassuring  ENDOCRINE  A:  Hyperglycemia.  Hypothyroidism. P:   ICU Glycemic Control Protocol Continue Levothyroxine   NEUROLOGIC A:  H/o CVA, dementia, seizures. Acute encephalopathy. P:   Goal RASS 0 to -1 Fentanyl PRN Continue Namenda, Keppra  TODAY'S SUMMARY: 79 yo NH patient with CVA / dysphagia / PEG brought in with severe dehydration and respiratory distress likely due to poorly managed oral secretions.  Intubated.  Empirical abx started.   I have personally obtained a history, examined the patient, evaluated laboratory and imaging results, formulated the assessment and plan and placed orders.  CRITICAL CARE:  The patient is critically ill with multiple organ systems failure and requires high complexity decision making for assessment and support, frequent evaluation and titration of therapies, application of advanced monitoring technologies and extensive interpretation of multiple databases. Critical Care Time devoted to patient care services described in this note is 35 minutes.   Levy Pupa, MD, PhD 11/12/2012, 11:03 AM [facility] [phone] or if no answer [phone]

## 2012-11-12 NOTE — Progress Notes (Signed)
CRITICAL VALUE ALERT  Critical value received:  Na+ 161, Chloride >130  Date of notification:  11/12/2012  Time of notification:  6:06 PM  Critical value read back:yes  Nurse who received alert:  Vassie Moselle  MD notified (1st page):  Tyson Alias  Time of first page:  6:06 PM  MD notified (2nd page):  Time of second page:  Responding MD:  Tyson Alias  Time MD responded:  6:06 PM

## 2012-11-12 NOTE — Consult Note (Addendum)
WOC consult Note Reason for Consult: Consult requested for sacrum and right heel wounds. Wound type: Sacrum with stage 2 wound: 2X3X.1cm, 100% pink and moist, no odor or drainage.  Currently protected with sacral prophylactic dressing, unable to keep dressing from soiling with stool.   Plan: Barrier cream to protect and promote healing and assist with repelling stool from affected area.  Pt on Sport low-air-loss bed to reduce pressure.  Right heel with hematoma present on admission which was debrided in the ER.  6X6cm dark purple deep tissue injury remains to the site, surrounded by dry peeling remains of a blister.  No odor, minimal yellow drainage.   Plan: Float heel to reduce pressure.  Wound gel to promote moist healing.  Foam dressing to protect.  Wound is high risk to evolve into full thickness tissue loss despite preventive measures and topical treatment.  Right posterior shoulder with deep tissue injury; 2X8cm, 100% dark purple.  No topical treatment needed at this time.  No family members present.  Pt is obtunded and intubated. Educational handout left in room regarding pressure ulcer information which discusses pressure ulcer etiology, topical treatment, and preventive measures. Please re-consult if further assistance is needed.  Thank-you,  Cammie Mcgee MSN, RN, CWOCN, [place], CNS [phone]

## 2012-11-12 NOTE — Clinical Social Work Note (Signed)
Clinical Social Work Department BRIEF PSYCHOSOCIAL ASSESSMENT 11/12/2012  Patient:  ALEXYA, MCDARIS     Account Number:  [id number]     Admit date:  11/11/2012  Clinical Social Worker:  Madaline Guthrie  Date/Time:  11/12/2012 03:15 PM  Referred by:  Care Management  Date Referred:  11/12/2012 Referred for  SNF Placement   Other Referral:   Interview type:  Family Other interview type:    PSYCHOSOCIAL DATA Living Status:  FACILITY Admitted from facility:  [facility] Level of care:  Skilled Nursing Facility Primary support name:  Leniyah Martell Contogiannis [phone] Primary support relationship to patient:  FAMILY Degree of support available:   good    CURRENT CONCERNS Current Concerns  Post-Acute Placement   Other Concerns:    SOCIAL WORK ASSESSMENT / PLAN Pt was admitted from [facility].  CSW updated FL2 and tried to reach pt's POA, Dr. [name] Contogiannis,  and left message.  She was unavailble due to work.  CSW will continue to contact Dr. Sherald Hess to discuss discharge plans and the family's wishes re: pt's return to [facility].   Assessment/plan status:  Other - See comment Other assessment/ plan:   SNF placement.   Information/referral to community resources:    PATIENT'S/FAMILY'S RESPONSE TO PLAN OF CARE:

## 2012-11-12 NOTE — Progress Notes (Signed)
Pharmacist Heart Failure Core Measure Documentation  Assessment: Jenny Brady has an EF documented as 15% on September 19, 2012 by ECHO.  Rationale: Heart failure patients with left ventricular systolic dysfunction (LVSD) and an EF < 40% should be prescribed an angiotensin converting enzyme inhibitor (ACEI) or angiotensin receptor blocker (ARB) at discharge unless a contraindication is documented in the medical record.  This patient is not currently on an ACEI or ARB for HF.  This note is being placed in the record in order to provide documentation that a contraindication to the use of these agents is present for this encounter.  ACE Inhibitor or Angiotensin Receptor Blocker is contraindicated (specify all that apply)  []   ACEI allergy AND ARB allergy []   Angioedema []   Moderate or severe aortic stenosis []   Hyperkalemia [x]   Hypotension []   Renal artery stenosis []   Worsening renal function, preexisting renal disease or dysfunction  Nadara Mustard, PharmD., MS Clinical Pharmacist Pager:  [phone] Thank you for allowing pharmacy to be part of this patients care team. 11/12/2012 12:57 PM

## 2012-11-12 NOTE — Progress Notes (Signed)
eLink Physician-Brief Progress Note Patient Name: Jenny Brady DOB: October 13, 1925 MRN: [id number]  Date of Service  11/12/2012   HPI/Events of Note  Na drop 4 in 13 hrs   eICU Interventions  Can increase slight safely 1./2 NS addition, bmet to folloa   Intervention Category Major Interventions: Electrolyte abnormality - evaluation and management  FEINSTEIN,DANIEL J. 11/12/2012, 3:47 PM

## 2012-11-12 NOTE — Progress Notes (Signed)
[facility] ADULT ICU REPLACEMENT PROTOCOL FOR AM LAB REPLACEMENT ONLY  The patient does not apply for the [facility] Adult ICU Electrolyte Replacment Protocol based on the criteria listed below:    2. Is urine output >/= 0.5 ml/kg/hr for the last 6 hours? no Patient's UOP is <0.1 ml/kg/hr 6. If a panic level lab has been reported, has the CCM MD in charge been notified? yes.   Physician:  Dr. Arvil Persons, Lalaine Overstreet P 11/12/2012 4:45 AM

## 2012-11-13 ENCOUNTER — Inpatient Hospital Stay (HOSPITAL_COMMUNITY): Payer: Medicare Other

## 2012-11-13 ENCOUNTER — Telehealth: Payer: Self-pay | Admitting: *Deleted

## 2012-11-13 DIAGNOSIS — I959 Hypotension, unspecified: Secondary | ICD-10-CM

## 2012-11-13 LAB — CBC
HCT: 35.9 % — ABNORMAL LOW (ref 36.0–46.0)
Hemoglobin: 10.6 g/dL — ABNORMAL LOW (ref 12.0–15.0)
Hemoglobin: 11.1 g/dL — ABNORMAL LOW (ref 12.0–15.0)
MCHC: 30.7 g/dL (ref 30.0–36.0)
WBC: 8.7 10*3/uL (ref 4.0–10.5)
WBC: 11.6 10*3/uL — ABNORMAL HIGH (ref 4.0–10.5)

## 2012-11-13 LAB — URINE CULTURE
Colony Count: NO GROWTH
Culture: NO GROWTH
Culture: NO GROWTH

## 2012-11-13 LAB — BASIC METABOLIC PANEL
BUN: 28 mg/dL — ABNORMAL HIGH (ref 6–23)
BUN: 35 mg/dL — ABNORMAL HIGH (ref 6–23)
BUN: 37 mg/dL — ABNORMAL HIGH (ref 6–23)
CO2: 23 mEq/L (ref 19–32)
Calcium: 8.1 mg/dL — ABNORMAL LOW (ref 8.4–10.5)
Chloride: 116 mEq/L — ABNORMAL HIGH (ref 96–112)
Chloride: 121 mEq/L — ABNORMAL HIGH (ref 96–112)
Chloride: 126 mEq/L — ABNORMAL HIGH (ref 96–112)
Creatinine, Ser: 0.38 mg/dL — ABNORMAL LOW (ref 0.50–1.10)
Creatinine, Ser: 0.41 mg/dL — ABNORMAL LOW (ref 0.50–1.10)
Creatinine, Ser: 0.45 mg/dL — ABNORMAL LOW (ref 0.50–1.10)
GFR calc Af Amer: >90 mL/min (ref >90)
GFR calc Af Amer: >90 mL/min (ref >90)
GFR calc Af Amer: >90 mL/min (ref >90)
GFR calc Af Amer: >90 mL/min (ref >90)
GFR calc non Af Amer: >90 mL/min (ref >90)
GFR calc non Af Amer: >90 mL/min (ref >90)
Glucose, Bld: 164 mg/dL — ABNORMAL HIGH (ref 70–99)
Potassium: 3.3 mEq/L — ABNORMAL LOW (ref 3.5–5.1)
Potassium: 3.4 mEq/L — ABNORMAL LOW (ref 3.5–5.1)
Potassium: 3.5 mEq/L (ref 3.5–5.1)
Sodium: 153 mEq/L — ABNORMAL HIGH (ref 135–145)
Sodium: 147 mEq/L — ABNORMAL HIGH (ref 135–145)

## 2012-11-13 LAB — LACTIC ACID, PLASMA: Lactic Acid, Venous: 2 mmol/L (ref 0.5–2.2)

## 2012-11-13 LAB — GLUCOSE, CAPILLARY
Glucose-Capillary: 171 mg/dL — ABNORMAL HIGH (ref 70–99)
Glucose-Capillary: 80 mg/dL (ref 70–99)

## 2012-11-13 LAB — PROCALCITONIN: Procalcitonin: 0.14 ng/mL

## 2012-11-13 MED ORDER — POTASSIUM CHLORIDE 20 MEQ/15ML (10%) PO LIQD
40.0000 meq | ORAL | Status: AC
  Administered 2012-11-14: 40 meq via ORAL
  Filled 2012-11-13 (×2): qty 30

## 2012-11-13 MED ORDER — SODIUM CHLORIDE 0.45 % IV SOLN
INTRAVENOUS | Status: DC
  Administered 2012-11-13 (×2): via INTRAVENOUS

## 2012-11-13 MED ORDER — PHENYLEPHRINE HCL 10 MG/ML IJ SOLN
30.0000 ug/min | INTRAVENOUS | Status: DC
  Administered 2012-11-13: 30 ug/min via INTRAVENOUS
  Administered 2012-11-13: 90 ug/min via INTRAVENOUS
  Administered 2012-11-14: 47 ug/min via INTRAVENOUS
  Administered 2012-11-15: 65 ug/min via INTRAVENOUS
  Administered 2012-11-16: 20 ug/min via INTRAVENOUS
  Administered 2012-11-16: 75 ug/min via INTRAVENOUS
  Administered 2012-11-17: 50 ug/min via INTRAVENOUS
  Administered 2012-11-17: 30 ug/min via INTRAVENOUS
  Administered 2012-11-18: 26 ug/min via INTRAVENOUS
  Filled 2012-11-13 (×10): qty 4

## 2012-11-13 MED ORDER — FREE WATER
300.0000 mL | Status: DC
  Administered 2012-11-13 – 2012-11-14 (×5): 300 mL

## 2012-11-13 MED ORDER — POTASSIUM CHLORIDE 20 MEQ/15ML (10%) PO LIQD
ORAL | Status: AC
  Administered 2012-11-13: 40 meq via ORAL
  Filled 2012-11-13: qty 30

## 2012-11-13 MED ORDER — POTASSIUM CHLORIDE 20 MEQ/15ML (10%) PO LIQD
20.0000 meq | ORAL | Status: AC
  Administered 2012-11-13 (×2): 20 meq
  Filled 2012-11-13 (×2): qty 15

## 2012-11-13 NOTE — Progress Notes (Signed)
[facility] ADULT ICU REPLACEMENT PROTOCOL FOR AM LAB REPLACEMENT ONLY  The patient does apply for the [facility] Adult ICU Electrolyte Replacment Protocol based on the criteria listed below:   1. Is GFR >/= 40 ml/min? yes  Patient's GFR today is >90 2. Is urine output >/= 0.5 ml/kg/hr for the last 6 hours? yes Patient's UOP is 0.5 ml/kg/hr 3. Is BUN < 60 mg/dL? yes  Patient's BUN today is 35 4. Abnormal electrolyte(s): Potassium 5. Ordered repletion with: Potassium per protocol  Analucia Hush P 11/13/2012 4:45 AM

## 2012-11-13 NOTE — Progress Notes (Signed)
PCCM Interval Note  Good discussion with pt's Niece and HCPOA Malanie Koloski today. Reviewed status, improvement and prognosis. In particular discussed continuing aggressive medical management with goal of extubation and back to rehab situation. Permission was given for a CVC placement should she require this - poor PIV access. Also asked her to consider overall goals of care w regard to CPR, trach should pt fail to extubate. She will discuss these issues with other family and we will revisit.   Levy Pupa, MD, PhD 11/13/2012, 2:14 PM [place] [facility] [phone] or if no answer [phone]

## 2012-11-13 NOTE — Procedures (Signed)
Central Venous Catheter Insertion Procedure Note DECKLYN HORNIK [id number] [birth year]  Procedure: Insertion of Central Venous Catheter Indications: Assessment of intravascular volume, Drug and/or fluid administration and Frequent blood sampling  Procedure Details Consent: Risks of procedure as well as the alternatives and risks of each were explained to the (patient/caregiver).  Consent for procedure obtained. Time Out: Verified patient identification, verified procedure, site/side was marked, verified correct patient position, special equipment/implants available, medications/allergies/relevent history reviewed, required imaging and test results available.  Performed Real tim eUS used to ID and cannulate  Maximum sterile technique was used including antiseptics, cap, gloves, gown, hand hygiene, mask and sheet. Skin prep: Chlorhexidine; local anesthetic administered A antimicrobial bonded/coated triple lumen catheter was placed in the right internal jugular vein using the Seldinger technique.  Evaluation Blood flow good Complications: No apparent complications Patient did tolerate procedure well. Chest X-ray ordered to verify placement.  CXR: pending.  BABCOCK,PETE 11/13/2012, 3:10 PM  Levy Pupa, MD, PhD 11/13/2012, 3:16 PM [place] [facility] [phone] or if no answer [phone]

## 2012-11-13 NOTE — Progress Notes (Signed)
Pt's TF is suspended due to PEG oozing, leaking. Skin around PEG is red and swollen, does not appear to be oozing any purulent drainage. MD aware and says he will be consulting IR.

## 2012-11-13 NOTE — Progress Notes (Signed)
Pt's PEG has increased draining (tan/pink). Skin around PEG is swollen and "bulged." Dr. Delton Coombes is aware and wants to continue TF.

## 2012-11-13 NOTE — Telephone Encounter (Signed)
Medical caregiver called asking for Dr. Pearlean Brownie to call ICU, due to pt in the hospital now.  ? keppra being stopped, hypernatremia now.  Resides at [facility].  U told her that Dr. Pearlean Brownie is at the hospital this am and have the ICU MD to page him.  All records on EPIC.  She stated she would do this.

## 2012-11-13 NOTE — Progress Notes (Signed)
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  In ED intubate for airway protection / hypoxemia.  Large amount of pooled secretions noted obstructing oropharynx during intubation. R heel hematoma debrided by EDP. Metabolic disarray.  PCCM was consulted.  SIGNIFICANT EVENTS / STUDIES:   LINES / TUBES: OETT 8/4 >>> Foley 8/4 >>>  CULTURES: 8/4 Blood >>> 8/4 Urine >>> negative  ANTIBIOTICS: Zosyn 8/4 >>> Vancomycin 8/4 >>>   HISTORY OF PRESENT ILLNESS:  79 yo with past medical history of recent CVA with resulting R hemiplegia and aphagia, s/p PEG placement and chronic Foley cath brought to ED with altered mental status and hypoxia.  In ED intubate for airway protection / hypoxemia.  Large amount of pooled secretions noted obstructing oropharynx during intubation. R heel hematoma  debrided by EDP. PCCM was consulted.   INTERVAL HISTORY: Started phenylephrine last pm, remains on low dose.  Na correcting Pt's family inquiring about whether she needs to remain on keppra  VITAL SIGNS: Temp:  [98.3 F (36.8 C)-100.8 F (38.2 C)] 98.3 F (36.8 C) (08/06 0832) Pulse Rate:  [71-96] 71 (08/06 1000) Resp:  [14-23] 20 (08/06 1000) BP: (80-116)/(36-84) 106/52 mmHg (08/06 1000) SpO2:  [95 %-100 %] 100 % (08/06 1000) FiO2 (%):  [40 %] 40 % (08/06 1000) HEMODYNAMICS:   VENTILATOR SETTINGS: Vent Mode:  [-] PSV;CPAP FiO2 (%):  [40 %] 40 % Set Rate:  [14 bmp] 14 bmp Vt Set:  [500 mL] 500 mL PEEP:  [5 cmH20] 5 cmH20 Pressure Support:  [8 cmH20-10 cmH20] 8 cmH20 Plateau Pressure:  [16  cmH20-17 cmH20] 16 cmH20  INTAKE / OUTPUT: Intake/Output     08/05 0701 - 08/06 0700 08/06 0701 - 08/07 0700   I.V. (mL/kg) 2696.2 (37.2) 223.1 (3.1)   Other 230    NG/GT 1139.2 275   IV Piggyback 750    Total Intake(mL/kg) 4815.4 (66.4) 498.1 (6.9)   Urine (mL/kg/hr) 1645 (0.9) 400 (1.3)   Total Output 1645 400   Net +3170.4 +98.1        Stool Occurrence 3 x      PHYSICAL EXAMINATION: General:  Mechanically ventilated, synchronous Neuro:  Well sedated, cough / gag diminished HEENT:  PERRL, OETT, poor oral hygiene  Cardiovascular:  RRR, no m/r/g Lungs:  Bilateral diminished air entry, no w/r/r Abdomen:  Soft, nontender, bowel sounds diminished, PEG site intact, chronic Foley Musculoskeletal:  Trace edema BL LE Skin:  Debrided hematoma R heel  LABS:  Recent Labs Lab 11/12/12 0350 11/12/12 1409 11/12/12 1700 11/12/12 2334 11/13/12 0400  NA 163* 159* 161* 154* 153*  K 2.6* 4.0 4.8 3.4* 3.3*  CL 128* 129* >130* 126* 121*  CO2 26 26 23 23 22   GLUCOSE 213* 167* 140* 164* 146*  BUN 49* 47* 46* 37* 35*  CREATININE 0.63 0.54 0.53 0.45* 0.42*  CALCIUM 8.0* 8.3* 7.7* 8.1* 8.4  MG 2.3  --   --   --   --   PHOS 2.8  --   --   --   --     Recent Labs Lab  11/11/12 2120 11/12/12 0350 11/13/12 0400  HGB 14.1 10.6* 11.1*  HCT 46.6* 35.9* 36.1  WBC 12.1* 8.7 11.6*  PLT 262 176 156    Recent Labs Lab 11/11/12 2120  AST 20  ALT 10  ALKPHOS 67  BILITOT 0.5  PROT 8.5*  ALBUMIN 2.3*    Recent Labs Lab 11/11/12 2213 11/12/12 0105  PHART 7.346* 7.422  PCO2ART 57.2* 39.3  PO2ART 95.0 156.0*  HCO3 30.7* 25.2*  TCO2 32 26.4  O2SAT 96.0 99.2    Recent Labs Lab 11/12/12 1555 11/12/12 1951 11/12/12 2348 11/13/12 0423 11/13/12 0740  GLUCAP 134* 124* 171* 144* 122*    CXR:  8/6 >>> ETT 1cm above carina, R basal atelectasis   ASSESSMENT / PLAN:  PULMONARY A:  Acute respiratory failure likely secondary to obstruction of oropharynx with secretions. P:    Full mechanical support Daily SBT, suspect no extubation until metabolic status corrected Albuterol PRN  CARDIOVASCULAR A: Hypotension in setting of dehydration / sedation / positive pressure ventilation. Consider also septic shock although source unclear P:  Goal MAP>60, supporting with IVF, phenylephrine Consider placement CVC to guide IVF, but would like to clarify agressiveness of care with family first before any invasive procedures.  Preadmission Zocor ordered  RENAL A:  Dehydration.   Hypernatremia. Hypokalemia P:   Trend BMP q6h Hold Lasix Correct Na slowly  Increase free water to 300 q6h  Start maintenance 0.45 NS Potassium relaced  GASTROINTESTINAL A:  Malnutrition.   Dysphagia, S/p PEG. P:   Continue preadmission TF and supplements Continue preadmission Protonix  HEMATOLOGIC A:  No active issues. P:  Trend CBC Heparin for DVT Px  INFECTIOUS A:  No clear infectious source. Pct 0.24 on 8/5 P:   Replaced chronic Foley 8/4 Cultures and empiric antibiotics as above, hesitate to d/c abx given evolving pressor needs. Will continue another day and reassess  ENDOCRINE  A:  Hyperglycemia.  Hypothyroidism. P:   ICU Glycemic Control Protocol Continue Levothyroxine   NEUROLOGIC A:  H/o CVA, dementia. Acute encephalopathy. Question seizure activity on previous ED visit P:   Goal RASS 0 to -1 Fentanyl PRN Continue Namenda Discussed case w Dr Pearlean Brownie 8/6 > she had ? Seizure activity, continued movement of her LE. underwent EEG in his office that showed no seizure focus. He agrees that we can stop the keppra and follow  TODAY'S SUMMARY: 79 yo NH patient with CVA / dysphagia / PEG brought in with severe dehydration and respiratory distress likely due to poorly managed oral secretions.  Intubated.  Empirical abx started.   I have personally obtained a history, examined the patient, evaluated laboratory and imaging results, formulated the assessment and plan and  placed orders.  CRITICAL CARE:  The patient is critically ill with multiple organ systems failure and requires high complexity decision making for assessment and support, frequent evaluation and titration of therapies, application of advanced monitoring technologies and extensive interpretation of multiple databases. Critical Care Time devoted to patient care services described in this note is 45 minutes.   Levy Pupa, MD, PhD 11/13/2012, 11:10 AM [facility] [phone] or if no answer [phone]

## 2012-11-14 ENCOUNTER — Inpatient Hospital Stay (HOSPITAL_COMMUNITY): Payer: Medicare Other

## 2012-11-14 ENCOUNTER — Encounter: Payer: Self-pay | Admitting: Internal Medicine

## 2012-11-14 DIAGNOSIS — E870 Hyperosmolality and hypernatremia: Secondary | ICD-10-CM | POA: Diagnosis present

## 2012-11-14 DIAGNOSIS — G9340 Encephalopathy, unspecified: Secondary | ICD-10-CM | POA: Diagnosis present

## 2012-11-14 LAB — CBC
HCT: 30 % — ABNORMAL LOW (ref 36.0–46.0)
Hemoglobin: 9.7 g/dL — ABNORMAL LOW (ref 12.0–15.0)
RBC: 3.11 MIL/uL — ABNORMAL LOW (ref 3.87–5.11)
WBC: 8.4 10*3/uL (ref 4.0–10.5)

## 2012-11-14 LAB — BASIC METABOLIC PANEL
CO2: 23 mEq/L (ref 19–32)
CO2: 23 mEq/L (ref 19–32)
Calcium: 8.1 mg/dL — ABNORMAL LOW (ref 8.4–10.5)
Calcium: 8.2 mg/dL — ABNORMAL LOW (ref 8.4–10.5)
Calcium: 8 mg/dL — ABNORMAL LOW (ref 8.4–10.5)
Chloride: 114 mEq/L — ABNORMAL HIGH (ref 96–112)
Chloride: 117 mEq/L — ABNORMAL HIGH (ref 96–112)
Creatinine, Ser: 0.34 mg/dL — ABNORMAL LOW (ref 0.50–1.10)
GFR calc Af Amer: >90 mL/min (ref >90)
GFR calc non Af Amer: >90 mL/min (ref >90)
Glucose, Bld: 138 mg/dL — ABNORMAL HIGH (ref 70–99)
Glucose, Bld: 110 mg/dL — ABNORMAL HIGH (ref 70–99)
Sodium: 145 mEq/L (ref 135–145)
Sodium: 143 mEq/L (ref 135–145)

## 2012-11-14 LAB — GLUCOSE, CAPILLARY
Glucose-Capillary: 100 mg/dL — ABNORMAL HIGH (ref 70–99)
Glucose-Capillary: 117 mg/dL — ABNORMAL HIGH (ref 70–99)
Glucose-Capillary: 128 mg/dL — ABNORMAL HIGH (ref 70–99)

## 2012-11-14 LAB — MAGNESIUM: Magnesium: 2 mg/dL (ref 1.5–2.5)

## 2012-11-14 LAB — PHOSPHORUS: Phosphorus: 3 mg/dL (ref 2.3–4.6)

## 2012-11-14 MED ORDER — SODIUM CHLORIDE 0.9 % IV BOLUS (SEPSIS)
1000.0000 mL | Freq: Once | INTRAVENOUS | Status: AC
  Administered 2012-11-14: 1000 mL via INTRAVENOUS

## 2012-11-14 MED ORDER — POTASSIUM CHLORIDE 20 MEQ/15ML (10%) PO LIQD
40.0000 meq | ORAL | Status: AC
  Administered 2012-11-14 – 2012-11-15 (×2): 40 meq
  Filled 2012-11-14 (×2): qty 30

## 2012-11-14 MED ORDER — IOHEXOL 300 MG/ML  SOLN
50.0000 mL | Freq: Once | INTRAMUSCULAR | Status: AC | PRN
  Administered 2012-11-14: 30 mL via ORAL

## 2012-11-14 MED ORDER — FENTANYL CITRATE 0.05 MG/ML IJ SOLN
INTRAMUSCULAR | Status: AC
  Filled 2012-11-14: qty 2

## 2012-11-14 MED ORDER — SODIUM CHLORIDE 0.9 % IV SOLN
INTRAVENOUS | Status: DC
  Administered 2012-11-14 – 2012-11-16 (×3): via INTRAVENOUS
  Administered 2012-11-18: 500 mL via INTRAVENOUS
  Administered 2012-11-18 – 2012-11-20 (×2): via INTRAVENOUS

## 2012-11-14 MED ORDER — FREE WATER
200.0000 mL | Freq: Four times a day (QID) | Status: DC
  Administered 2012-11-14 – 2012-11-18 (×15): 200 mL

## 2012-11-14 MED ORDER — PRO-STAT SUGAR FREE PO LIQD
30.0000 mL | Freq: Three times a day (TID) | ORAL | Status: DC
  Administered 2012-11-14 – 2012-11-22 (×23): 30 mL
  Filled 2012-11-14 (×26): qty 30

## 2012-11-14 MED ORDER — JEVITY 1.2 CAL PO LIQD
1000.0000 mL | ORAL | Status: DC
  Administered 2012-11-14 – 2012-11-16 (×4): 1000 mL
  Administered 2012-11-17: 17:00:00
  Administered 2012-11-18: 1000 mL
  Administered 2012-11-19 – 2012-11-21 (×2)
  Filled 2012-11-14 (×15): qty 1000

## 2012-11-14 NOTE — Progress Notes (Signed)
PULMONARY  / CRITICAL CARE MEDICINE  Name: Jenny Brady MRN: [id number] DOB: [birth year]    ADMISSION DATE:  11/11/2012 CONSULTATION DATE:  11/11/2012  REFERRING MD :  EDP PRIMARY SERVICE:  PCCM  CHIEF COMPLAINT:  Acute respiratory failure  BRIEF PATIENT DESCRIPTION: 79 yo with past medical history of recent CVA with resulting R hemiplegia and aphagia, s/p PEG placement and chronic Foley cath brought to ED with altered mental status and hypoxia.  In ED intubate for airway protection / hypoxemia.  Large amount of pooled secretions noted obstructing oropharynx during intubation. R heel hematoma debrided by EDP. Metabolic disarray.  PCCM was consulted.  SIGNIFICANT EVENTS / STUDIES:   LINES / TUBES: OETT 8/4 >>> Foley 8/4 >> PEG prior admission >>   CULTURES: 8/4 Blood >>> 8/4 Urine >>> negative  ANTIBIOTICS: Zosyn 8/4 >>> Vancomycin 8/4 >>>   HISTORY OF PRESENT ILLNESS:  79 yo with past medical history of recent CVA with resulting R hemiplegia and aphagia, s/p PEG placement and chronic Foley cath brought to ED with altered mental status and hypoxia.  In ED intubate for airway protection / hypoxemia.  Large amount of pooled secretions noted obstructing oropharynx during intubation. R heel hematoma  debrided by EDP. PCCM was consulted.   INTERVAL HISTORY: remains on phenylephrine Na corrected  VITAL SIGNS: Temp:  [98.1 F (36.7 C)-100.2 F (37.9 C)] 98.1 F (36.7 C) (08/07 1157) Pulse Rate:  [71-85] 76 (08/07 1113) Resp:  [14-20] 19 (08/07 1113) BP: (82-106)/(37-67) 100/49 mmHg (08/07 1113) SpO2:  [98 %-100 %] 100 % (08/07 1113) FiO2 (%):  [40 %] 40 % (08/07 1113) Weight:  [77.3 kg (170 lb 6.7 oz)] 77.3 kg (170 lb 6.7 oz) (08/07 0500) HEMODYNAMICS: CVP:  [1 mmHg] 1 mmHg VENTILATOR SETTINGS: Vent Mode:  [-] PSV;CPAP FiO2 (%):  [40 %] 40 % Set Rate:  [14 bmp] 14 bmp Vt Set:  [500 mL] 500 mL PEEP:  [5 cmH20] 5 cmH20 Pressure Support:  [8 cmH20] 8 cmH20 Plateau  Pressure:  [18 cmH20-21 cmH20] 20 cmH20  INTAKE / OUTPUT: Intake/Output     08/06 0701 - 08/07 0700 08/07 0701 - 08/08 0700   I.V. (mL/kg) 2509.5 (32.5) 465.7 (6)   Other     NG/GT 550 100   IV Piggyback     Total Intake(mL/kg) 3059.5 (39.6) 565.7 (7.3)   Urine (mL/kg/hr) 2255 (1.2) 550 (1.4)   Total Output 2255 550   Net +804.5 +15.7        Stool Occurrence 3 x      PHYSICAL EXAMINATION: General:  Mechanically ventilated, synchronous Neuro:  Well sedated, cough / gag diminished HEENT:  PERRL, OETT, poor oral hygiene  Cardiovascular:  RRR, no m/r/g Lungs:  Bilateral diminished air entry, no w/r/r Abdomen:  Soft, nontender, bowel sounds diminished, PEG site intact, chronic Foley Musculoskeletal:  Trace edema BL LE Skin:  Debrided hematoma R heel  LABS:  Recent Labs Lab 11/12/12 0350  11/13/12 0400 11/13/12 1016 11/13/12 1700 11/13/12 2328 11/14/12 0500  NA 163*  < > 153* 150* 147* 145 143  K 2.6*  < > 3.3* 3.5 3.1* 3.9 4.3  CL 128*  < > 121* 118* 116* 115* 114*  CO2 26  < > 22 23 24 23 23   GLUCOSE 213*  < > 146* 119* 94 121* 138*  BUN 49*  < > 35* 28* 21 20 18   CREATININE 0.63  < > 0.42* 0.41* 0.38* 0.39* 0.37*  CALCIUM 8.0*  < >  8.4 8.3* 8.1* 8.1* 8.2*  MG 2.3  --   --   --   --   --  2.0  PHOS 2.8  --   --   --   --   --  3.0  < > = values in this interval not displayed.  Recent Labs Lab 11/12/12 0350 11/13/12 0400 11/14/12 0500  HGB 10.6* 11.1* 9.7*  HCT 35.9* 36.1 30.0*  WBC 8.7 11.6* 8.4  PLT 176 156 231    Recent Labs Lab 11/11/12 2120  AST 20  ALT 10  ALKPHOS 67  BILITOT 0.5  PROT 8.5*  ALBUMIN 2.3*    Recent Labs Lab 11/11/12 2213 11/12/12 0105  PHART 7.346* 7.422  PCO2ART 57.2* 39.3  PO2ART 95.0 156.0*  HCO3 30.7* 25.2*  TCO2 32 26.4  O2SAT 96.0 99.2    Recent Labs Lab 11/13/12 1559 11/13/12 2001 11/13/12 2355 11/14/12 0421 11/14/12 0750  GLUCAP 88 80 117* 128* 127*    CXR:  8/6 >>> ETT 1cm above carina, R basal  atelectasis   ASSESSMENT / PLAN:  PULMONARY A:  Acute respiratory failure likely secondary to obstruction of oropharynx with secretions. P:   Full mechanical support Daily SBT, await improvement in MS and  Albuterol PRN  CARDIOVASCULAR A: Hypotension in setting of dehydration / sedation / positive pressure ventilation. Consider also septic shock although source unclear P:  Goal MAP>60, supporting with IVF, phenylephrine Consider placement CVC to guide IVF, but would like to clarify agressiveness of care with family first before any invasive procedures.  Preadmission Zocor ordered  RENAL A:  Dehydration.   Hypernatremia. Hypokalemia P:   Trend BMP q12h Hold Lasix one more day Adjust free water now that Na corrected Potassium relaced  GASTROINTESTINAL A:  Malnutrition.   Dysphagia, S/p PEG. Malfunctioning PEG, ? Adequate positioning P:   Continue preadmission TF and supplements per Panda Continue preadmission Protonix Consult IR to evaluate the PEG function and placement  HEMATOLOGIC A:  No active issues. P:  Trend CBC Heparin for DVT Px  INFECTIOUS A:  No clear infectious source. Pct 0.24 on 8/5 P:   Replaced chronic Foley 8/4 Cultures and empiric antibiotics as above, hesitate to d/c abx given evolving pressor needs. Will recheck Pct, follow  ENDOCRINE  A:  Hyperglycemia.  Hypothyroidism. P:   ICU Glycemic Control Protocol Continue Levothyroxine   NEUROLOGIC A:  H/o CVA, dementia. Acute encephalopathy. Question seizure activity on previous ED visit P:   Goal RASS 0 to -1 Fentanyl PRN Continue Namenda Discussed case w Dr Pearlean Brownie 8/6 > she had ? Seizure activity, continued movement of her LE. underwent EEG in his office that showed no seizure focus. He agrees that we can stop the keppra and follow  TODAY'S SUMMARY: 79 yo NH patient with CVA / dysphagia / PEG brought in with severe dehydration and respiratory distress likely due to poorly managed oral  secretions.  Intubated.  Empirical abx.    I have personally obtained a history, examined the patient, evaluated laboratory and imaging results, formulated the assessment and plan and placed orders.  CRITICAL CARE:  The patient is critically ill with multiple organ systems failure and requires high complexity decision making for assessment and support, frequent evaluation and titration of therapies, application of advanced monitoring technologies and extensive interpretation of multiple databases. Critical Care Time devoted to patient care services described in this note is 40 minutes.   Levy Pupa, MD, PhD 11/14/2012, 12:10 PM [facility] [phone] or if no answer [phone]

## 2012-11-14 NOTE — Progress Notes (Signed)
NUTRITION FOLLOW UP  Intervention:    1. Increase Jevity 1.2 by 10 ml/hr to goal rate of 35 ml/hr  2. Decrease Prostat to 30 ml TID.  3. Continue MVI daily  At goal rate tube feeding regimen will provide 1308 kcal (97% of needs), 91 grams protein (> 100% of needs), and 681 ml H2O.  Nutrition Dx:   Inadequate oral intake related to inability to eat as evidenced by NPO status; ongoing.    Goal:  Pt to meet >/= 90% of their estimated nutrition needs, met.   Monitor:  Vent status, TF tolerance, weight trend, labs   Assessment:   Pt with past medical history of recent CVA with resulting R hemiplegia and aphagia, s/p PEG placement (07/2012) and chronic Foley cath brought to ED with altered mental status and hypoxia.  Patient is currently intubated on ventilator support. Pt with hypernatremia and hypokalemia. Free water increased.  MV: 7.8 L/min Temp:Temp (24hrs), Avg:99 F (37.2 C), Min:98.3 F (36.8 C), Max:100.2 F (37.9 C)  PEG oozing/leaking, IR consulted.  Nasoenteric feeding tube placed.  Patient has nasogastric feeding tube in place with tip of tube distal stomach. Jevity 1.2 is infusing @ 25 ml/hr. 30 ml Prostat via tube QID. Tube feeding regimen currently providing 1120 kcal, 93 grams protein, and 486 ml H2O.   Free water flushes: 300 ml every 4 hrs providing 1800 of free water.  Total free water: 2286 ml per day.  Residuals: 0  Height: Ht Readings from Last 1 Encounters:  11/12/12 5\' 3"  (1.6 m)    Weight Status:   Wt Readings from Last 1 Encounters:  11/14/12 170 lb 6.7 oz (77.3 kg)  Admission weight 159 lb 1-3+ edema noted  Re-estimated needs:  Kcal: 1343 Protein: >/= 87 grams Fluid: > 1.5 L/day  Skin: stage II sacrum, DTI on right heel, DTI on right shoulder  Diet Order:   NPO   Intake/Output Summary (Last 24 hours) at 11/14/12 1109 Last data filed at 11/14/12 1100  Gross per 24 hour  Intake 3029.6 ml  Output   2405 ml  Net  624.6 ml    Last  BM: 8/7   Labs:   Recent Labs Lab 11/12/12 0350  11/13/12 1700 11/13/12 2328 11/14/12 0500  NA 163*  < > 147* 145 143  K 2.6*  < > 3.1* 3.9 4.3  CL 128*  < > 116* 115* 114*  CO2 26  < > 24 23 23   BUN 49*  < > 21 20 18   CREATININE 0.63  < > 0.38* 0.39* 0.37*  CALCIUM 8.0*  < > 8.1* 8.1* 8.2*  MG 2.3  --   --   --  2.0  PHOS 2.8  --   --   --  3.0  GLUCOSE 213*  < > 94 121* 138*  < > = values in this interval not displayed.  CBG (last 3)   Recent Labs  11/13/12 2355 11/14/12 0421 11/14/12 0750  GLUCAP 117* 128* 127*   Lab Results  Component Value Date   HGBA1C 6.2* 10/19/2012    Scheduled Meds: . antiseptic oral rinse  1 application Mouth Rinse QID  . chlorhexidine  15 mL Mouth/Throat BID  . cholecalciferol  2,000 Units Oral Daily  . cyanocobalamin  500 mcg Per Tube Daily  . feeding supplement  30 mL Per Tube QID  . free water  300 mL Per Tube Q4H  . heparin  5,000 Units Subcutaneous Q8H  . insulin  aspart  0-15 Units Subcutaneous Q4H  . levothyroxine  88 mcg Per Tube QAC breakfast  . memantine  5 mg Per Tube BID  . multivitamin with minerals  1 tablet Per Tube Daily  . pantoprazole sodium  40 mg Per Tube Daily  . simvastatin  20 mg Oral q1800    Continuous Infusions: . sodium chloride 100 mL/hr at 11/14/12 0800  . feeding supplement (JEVITY 1.2 CAL) 1,000 mL (11/14/12 0352)  . phenylephrine (NEO-SYNEPHRINE) Adult infusion 45 mcg/min (11/14/12 0900)    Kendell Bane RD, LDN, CNSC [phone] Pager [phone] After Hours Pager

## 2012-11-14 NOTE — Procedures (Signed)
Procedure:  Gastrostomy tube injection, removal and replacement Findings:  Bumper of preexisting gastrostomy in subcutaneous tissues and out of stomach.  Tube easily removed. Tract catheterized and serially dilated over guidewire.  New 20 Fr balloon retention gastrostomy tube advanced over wire. Tip in stomach.  OK to use.

## 2012-11-15 ENCOUNTER — Inpatient Hospital Stay (HOSPITAL_COMMUNITY): Payer: Medicare Other

## 2012-11-15 DIAGNOSIS — G9340 Encephalopathy, unspecified: Secondary | ICD-10-CM

## 2012-11-15 DIAGNOSIS — I5023 Acute on chronic systolic (congestive) heart failure: Secondary | ICD-10-CM

## 2012-11-15 LAB — GLUCOSE, CAPILLARY: Glucose-Capillary: 134 mg/dL — ABNORMAL HIGH (ref 70–99)

## 2012-11-15 LAB — BASIC METABOLIC PANEL
BUN: 15 mg/dL (ref 6–23)
Calcium: 7.9 mg/dL — ABNORMAL LOW (ref 8.4–10.5)
Creatinine, Ser: 0.36 mg/dL — ABNORMAL LOW (ref 0.50–1.10)
GFR calc non Af Amer: >90 mL/min (ref >90)
Glucose, Bld: 169 mg/dL — ABNORMAL HIGH (ref 70–99)

## 2012-11-15 LAB — CBC
Hemoglobin: 9.4 g/dL — ABNORMAL LOW (ref 12.0–15.0)
MCH: 30.3 pg (ref 26.0–34.0)
MCHC: 31.8 g/dL (ref 30.0–36.0)
Platelets: 246 10*3/uL (ref 150–400)

## 2012-11-15 MED ORDER — FUROSEMIDE 40 MG PO TABS
40.0000 mg | ORAL_TABLET | Freq: Every day | ORAL | Status: DC
  Administered 2012-11-15 – 2012-11-17 (×3): 40 mg via ORAL
  Filled 2012-11-15 (×4): qty 1

## 2012-11-15 NOTE — Clinical Social Work Note (Signed)
CSW talked with pt's POA, [name] Contigiannis.   She plans to have a meeting with [facility] the beginning of next week and will make the decision at that time if she wants to pursue other SNF options.  [facility] is also looking into pt going to [facility].   Plan is for CSW and Shelma Eiben to talk again on Tuesday to discuss the results of her inquiries and to further discuss discharge planning.

## 2012-11-15 NOTE — Progress Notes (Signed)
PULMONARY  / CRITICAL CARE MEDICINE  Name: Jenny Brady MRN: [id number] DOB: Jul 30, 1925    ADMISSION DATE:  11/11/2012 CONSULTATION DATE:  11/11/2012  REFERRING MD :  EDP PRIMARY SERVICE:  PCCM  CHIEF COMPLAINT:  Acute respiratory failure  BRIEF PATIENT DESCRIPTION: 79 yo with past medical history of recent CVA with resulting R hemiplegia and aphagia, s/p PEG placement and chronic Foley cath brought to ED with altered mental status and hypoxia.  In ED intubate for airway protection / hypoxemia.  Large amount of pooled secretions noted obstructing oropharynx during intubation. R heel hematoma debrided by EDP. Metabolic disarray.  PCCM was consulted.  SIGNIFICANT EVENTS / STUDIES:  Replaced malpositioned PEG 8/7 >>    LINES / TUBES: OETT 8/4 >>> Foley 8/4 >> PEG prior admission >> 8/7 New PEG 8/7 >>   CULTURES: 8/4 Blood >>> 8/4 Urine >>> negative  ANTIBIOTICS: Zosyn 8/4 >>> Vancomycin 8/4 >>> 8/8   HISTORY OF PRESENT ILLNESS:  79 yo with past medical history of recent CVA with resulting R hemiplegia and aphagia, s/p PEG placement and chronic Foley cath brought to ED with altered mental status and hypoxia.  In ED intubate for airway protection / hypoxemia.  Large amount of pooled secretions noted obstructing oropharynx during intubation. R heel hematoma  debrided by EDP. PCCM was consulted.   INTERVAL HISTORY: remains on phenylephrine Wakes to voice Tolerating some PSV  VITAL SIGNS: Temp:  [94.8 F (34.9 C)-100.5 F (38.1 C)] 94.8 F (34.9 C) (08/08 0806) Pulse Rate:  [64-112] 80 (08/08 1100) Resp:  [14-27] 19 (08/08 1100) BP: (75-131)/(19-90) 97/49 mmHg (08/08 1100) SpO2:  [98 %-100 %] 100 % (08/08 1100) FiO2 (%):  [40 %-100 %] 40 % (08/08 0806) Weight:  [79.3 kg (174 lb 13.2 oz)] 79.3 kg (174 lb 13.2 oz) (08/08 0500) HEMODYNAMICS: CVP:  [1 mmHg-7 mmHg] 2 mmHg VENTILATOR SETTINGS: Vent Mode:  [-] CPAP;PSV FiO2 (%):  [40 %-100 %] 40 % Set Rate:  [14 bmp] 14  bmp Vt Set:  [500 mL] 500 mL PEEP:  [5 cmH20] 5 cmH20 Pressure Support:  [8 cmH20] 8 cmH20 Plateau Pressure:  [13 cmH20-24 cmH20] 13 cmH20  INTAKE / OUTPUT: Intake/Output     08/07 0701 - 08/08 0700 08/08 0701 - 08/09 0700   I.V. (mL/kg) 1700.2 (21.4) 223.2 (2.8)   NG/GT 1310 105   IV Piggyback 1000    Total Intake(mL/kg) 4010.2 (50.6) 328.2 (4.1)   Urine (mL/kg/hr) 4500 (2.4) 175 (0.5)   Total Output 4500 175   Net -489.8 +153.2        Stool Occurrence 1 x      PHYSICAL EXAMINATION: General:  Mechanically ventilated, synchronous Neuro:  Well sedated, cough / gag diminished HEENT:  PERRL, OETT, poor oral hygiene  Cardiovascular:  RRR, no m/r/g Lungs:  Bilateral diminished air entry, no w/r/r Abdomen:  Soft, nontender, bowel sounds diminished, PEG site intact, chronic Foley Musculoskeletal:  Trace edema BL LE Skin:  Debrided hematoma R heel  LABS:  Recent Labs Lab 11/12/12 0350  11/13/12 1700 11/13/12 2328 11/14/12 0500 11/14/12 1700 11/15/12 0500  NA 163*  < > 147* 145 143 146* 145  K 2.6*  < > 3.1* 3.9 4.3 3.3* 4.0  CL 128*  < > 116* 115* 114* 117* 115*  CO2 26  < > 24 23 23 23 24   GLUCOSE 213*  < > 94 121* 138* 110* 169*  BUN 49*  < > 21 20 18 14  15  CREATININE 0.63  < > 0.38* 0.39* 0.37* 0.34* 0.36*  CALCIUM 8.0*  < > 8.1* 8.1* 8.2* 8.0* 7.9*  MG 2.3  --   --   --  2.0  --   --   PHOS 2.8  --   --   --  3.0  --   --   < > = values in this interval not displayed.  Recent Labs Lab 11/13/12 0400 11/14/12 0500 11/15/12 0500  HGB 11.1* 9.7* 9.4*  HCT 36.1 30.0* 29.6*  WBC 11.6* 8.4 6.2  PLT 156 231 246    Recent Labs Lab 11/11/12 2120  AST 20  ALT 10  ALKPHOS 67  BILITOT 0.5  PROT 8.5*  ALBUMIN 2.3*    Recent Labs Lab 11/11/12 2213 11/12/12 0105  PHART 7.346* 7.422  PCO2ART 57.2* 39.3  PO2ART 95.0 156.0*  HCO3 30.7* 25.2*  TCO2 32 26.4  O2SAT 96.0 99.2    Recent Labs Lab 11/14/12 1556 11/14/12 2006 11/14/12 2350 11/15/12 0431  11/15/12 0752  GLUCAP 100* 118* 150* 147* 134*    CXR:  8/8 > ? Increased R medial basilar infiltrate  ASSESSMENT / PLAN:  PULMONARY A:  Acute respiratory failure likely secondary to obstruction of oropharynx with secretions. P:   Full mechanical support Daily SBT, tolerating some PSV, goal extubate soon Albuterol PRN  CARDIOVASCULAR A: Hypotension in setting of dehydration / sedation / positive pressure ventilation. Consider also septic shock although source unclear P:  Goal MAP>60, supporting with IVF, phenylephrine IVF's for goal CVP > 10 Preadmission Zocor ordered  RENAL A:  Dehydration.   Hypernatremia. Hypokalemia P:   Trend BMP qd Restart home lasix 40 per tube qd Continue free water Potassium relaced  GASTROINTESTINAL A:  Malnutrition.   Dysphagia, S/p PEG. Malfunctioning PEG, replaced 8/7 P:   Continue preadmission TF and supplements per new PEG Continue preadmission Protonix  HEMATOLOGIC A:  No active issues. P:  Trend CBC Heparin for DVT Px  INFECTIOUS A:  No clear infectious source. Pct 0.24 on 8/5  Recent Labs Lab 11/12/12 0113 11/12/12 0350 11/13/12 0400  PROCALCITON 0.19 0.24 0.14  P:   Replaced chronic Foley 8/4 Cultures and empiric antibiotics as above, hesitate to d/c abx altogether given pressor needs. Pct is reassuring. Will stop vanco on 8/8 and follow  ENDOCRINE  A:  Hyperglycemia.  Hypothyroidism. P:   ICU Glycemic Control Protocol Continue Levothyroxine   NEUROLOGIC A:  H/o CVA, dementia. Acute encephalopathy. Question seizure activity on previous ED visit  P:   Goal RASS 0 to -1 Fentanyl PRN Continue Namenda Discussed case w Dr Pearlean Brownie 8/6 > she had ? Seizure activity, continued movement of her LE. underwent EEG in his office that showed no seizure focus. He agrees w stopping the keppra and follow  TODAY'S SUMMARY: 79 yo NH patient with CVA / dysphagia / PEG brought in with severe dehydration and respiratory distress  likely due to poorly managed oral secretions.  Intubated.  Empirical abx.    I have personally obtained a history, examined the patient, evaluated laboratory and imaging results, formulated the assessment and plan and placed orders.  CRITICAL CARE:  The patient is critically ill with multiple organ systems failure and requires high complexity decision making for assessment and support, frequent evaluation and titration of therapies, application of advanced monitoring technologies and extensive interpretation of multiple databases. Critical Care Time devoted to patient care services described in this note is 40 minutes.   Levy Pupa, MD,  PhD 11/15/2012, 11:15 AM [facility] [phone] or if no answer [phone]

## 2012-11-16 ENCOUNTER — Inpatient Hospital Stay (HOSPITAL_COMMUNITY): Payer: Medicare Other

## 2012-11-16 DIAGNOSIS — A419 Sepsis, unspecified organism: Secondary | ICD-10-CM

## 2012-11-16 DIAGNOSIS — R6520 Severe sepsis without septic shock: Secondary | ICD-10-CM

## 2012-11-16 LAB — COMPREHENSIVE METABOLIC PANEL
ALT: 11 U/L (ref 0–35)
Calcium: 8.2 mg/dL — ABNORMAL LOW (ref 8.4–10.5)
GFR calc Af Amer: >90 mL/min (ref >90)
Glucose, Bld: 140 mg/dL — ABNORMAL HIGH (ref 70–99)
Sodium: 141 mEq/L (ref 135–145)
Total Protein: 5.8 g/dL — ABNORMAL LOW (ref 6.0–8.3)

## 2012-11-16 LAB — CBC
HCT: 30.2 % — ABNORMAL LOW (ref 36.0–46.0)
Hemoglobin: 9.8 g/dL — ABNORMAL LOW (ref 12.0–15.0)
MCH: 30.8 pg (ref 26.0–34.0)
MCHC: 32.5 g/dL (ref 30.0–36.0)
RDW: 16 % — ABNORMAL HIGH (ref 11.5–15.5)

## 2012-11-16 LAB — GLUCOSE, CAPILLARY
Glucose-Capillary: 131 mg/dL — ABNORMAL HIGH (ref 70–99)
Glucose-Capillary: 142 mg/dL — ABNORMAL HIGH (ref 70–99)
Glucose-Capillary: 121 mg/dL — ABNORMAL HIGH (ref 70–99)

## 2012-11-16 MED ORDER — POTASSIUM CHLORIDE 20 MEQ/15ML (10%) PO LIQD
30.0000 meq | ORAL | Status: AC
  Administered 2012-11-16 (×2): 30 meq
  Filled 2012-11-16 (×2): qty 30

## 2012-11-16 NOTE — Progress Notes (Signed)
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  In ED intubate for airway protection / hypoxemia.  Large amount of pooled secretions noted obstructing oropharynx during intubation. R heel hematoma debrided by EDP. Metabolic disarray.  PCCM was consulted.  SIGNIFICANT EVENTS / STUDIES:  Replaced malpositioned PEG 8/7 >>   LINES / TUBES: OETT 8/4 >>> Foley 8/4 >> PEG prior admission >> 8/7 New PEG 8/7 >>   CULTURES: 8/4 Blood >>> 8/4 Urine >>> negative  ANTIBIOTICS: Zosyn 8/4 >>> Vancomycin 8/4 >>> 8/8   HISTORY OF PRESENT ILLNESS:  79 yo with past medical history of recent CVA with resulting R hemiplegia and aphagia, s/p PEG placement and chronic Foley cath brought to ED with altered mental status and hypoxia.  In ED intubate for airway protection / hypoxemia.  Large amount of pooled secretions noted obstructing oropharynx during intubation. R heel hematoma  debrided by EDP. PCCM was consulted.   INTERVAL HISTORY: remains on phenylephrine More awake today, eyes open Tolerating PSV  VITAL SIGNS: Temp:  [97.4 F (36.3 C)-99.5 F (37.5 C)] 99.5 F (37.5 C) (08/09 0805) Pulse Rate:  [67-105] 74 (08/09 0900) Resp:  [12-20] 16 (08/09 0900) BP: (61-124)/(34-79) 109/58 mmHg (08/09 0900) SpO2:  [97 %-100 %] 100 % (08/09 0900) FiO2 (%):  [40 %] 40 % (08/09 0900) Weight:  [80.5 kg (177 lb 7.5 oz)] 80.5 kg (177 lb 7.5 oz) (08/09 0500) HEMODYNAMICS: CVP:  [0 mmHg-14 mmHg] 4 mmHg VENTILATOR SETTINGS: Vent Mode:  [-] PSV;CPAP FiO2 (%):  [40 %] 40 % Set Rate:  [14 bmp] 14 bmp Vt Set:   [500 mL] 500 mL PEEP:  [5 cmH20] 5 cmH20 Pressure Support:  [5 cmH20-8 cmH20] 5 cmH20 Plateau Pressure:  [17 cmH20-18 cmH20] 17 cmH20  INTAKE / OUTPUT: Intake/Output     08/08 0701 - 08/09 0700 08/09 0701 - 08/10 0700   I.V. (mL/kg) 1473.2 (18.3) 127.3 (1.6)   NG/GT 805 70   IV Piggyback     Total Intake(mL/kg) 2278.2 (28.3) 197.3 (2.5)   Urine (mL/kg/hr) 1421 (0.7) 550 (3)   Stool 1 (0)    Total Output 1422 550   Net +856.2 -352.8          PHYSICAL EXAMINATION: General:  Mechanically ventilated, synchronous Neuro:  Well sedated, cough / gag diminished HEENT:  PERRL, OETT, poor oral hygiene  Cardiovascular:  RRR, no m/r/g Lungs:  Bilateral diminished air entry, no w/r/r Abdomen:  Soft, nontender, bowel sounds diminished, PEG site intact, chronic Foley Musculoskeletal:  Trace edema BL LE Skin:  Debrided hematoma R heel  LABS:  Recent Labs Lab 11/12/12 0350  11/13/12 2328 11/14/12 0500 11/14/12 1700 11/15/12 0500 11/16/12 0500  NA 163*  < > 145 143 146* 145 141  K 2.6*  < > 3.9 4.3 3.3* 4.0 3.1*  CL 128*  < > 115* 114* 117* 115* 109  CO2 26  < > 23 23 23 24 24   GLUCOSE 213*  < > 121* 138* 110* 169* 140*  BUN 49*  < > 20 18 14 15 13   CREATININE 0.63  < >  0.39* 0.37* 0.34* 0.36* 0.34*  CALCIUM 8.0*  < > 8.1* 8.2* 8.0* 7.9* 8.2*  MG 2.3  --   --  2.0  --   --  1.9  PHOS 2.8  --   --  3.0  --   --  3.0  < > = values in this interval not displayed.  Recent Labs Lab 11/14/12 0500 11/15/12 0500 11/16/12 0500  HGB 9.7* 9.4* 9.8*  HCT 30.0* 29.6* 30.2*  WBC 8.4 6.2 7.4  PLT 231 246 233    Recent Labs Lab 11/11/12 2120 11/16/12 0500  AST 20 16  ALT 10 11  ALKPHOS 67 64  BILITOT 0.5 0.3  PROT 8.5* 5.8*  ALBUMIN 2.3* 1.5*    Recent Labs Lab 11/11/12 2213 11/12/12 0105  PHART 7.346* 7.422  PCO2ART 57.2* 39.3  PO2ART 95.0 156.0*  HCO3 30.7* 25.2*  TCO2 32 26.4  O2SAT 96.0 99.2    Recent Labs Lab 11/15/12 1535 11/15/12 1950 11/15/12 2348  11/16/12 0429 11/16/12 0717  GLUCAP 124* 141* 114* 131* 135*    CXR:  8/9 > mild L basilar atx, ETT 1cm above carina  ASSESSMENT / PLAN:  PULMONARY A:  Acute respiratory failure likely secondary to obstruction of oropharynx with secretions. P:   Full mechanical support Daily SBT, tolerating some PSV, goal extubate as soon as MS and oropharyngeal strength allow. Will need to discuss with her HCPOA whether we would re-intubate if we do extubate.  Albuterol PRN  CARDIOVASCULAR A: Hypotension in setting of dehydration / sedation / positive pressure ventilation. Consider also septic shock although source unclear P:  Goal MAP>60, supporting with IVF, phenylephrine. Still unable to wean phenylephrine completely, slowly decreasing IVF's for goal CVP > 10 Preadmission Zocor ordered  RENAL A:  Dehydration.   Hypernatremia. Hypokalemia P:   Trend BMP qd Restarted home lasix 40 per tube qd 8/8 Continue free water Potassium relaced  GASTROINTESTINAL A:  Malnutrition.   Dysphagia, S/p PEG. Malfunctioning PEG, replaced 8/7 P:   Continue preadmission TF and supplements per new PEG Continue preadmission Protonix  HEMATOLOGIC A:  No active issues. P:  Trend CBC Heparin for DVT Px  INFECTIOUS A:  No clear infectious source. Pct 0.24 on 8/5  Recent Labs Lab 11/12/12 0113 11/12/12 0350 11/13/12 0400  PROCALCITON 0.19 0.24 0.14  P:   Replaced chronic Foley 8/4 Cultures and empiric antibiotics as above, hesitate to d/c abx altogether given pressor needs. Pct is reassuring. Stopped vanco on 8/8 and following  ENDOCRINE  A:  Hyperglycemia.  Hypothyroidism. P:   ICU Glycemic Control Protocol Continue Levothyroxine   NEUROLOGIC A:  H/o CVA, dementia. Acute encephalopathy. Question seizure activity on previous ED visit  P:   Goal RASS 0 to -1 Fentanyl PRN Continue Namenda Discussed case w Dr Pearlean Brownie 8/6 > she had ? Seizure activity, continued movement of her LE. underwent  EEG in his office that showed no seizure focus. He agrees w stopping the keppra and follow  TODAY'S SUMMARY: 79 yo NH patient with CVA / dysphagia / PEG brought in with severe dehydration and respiratory distress likely due to poorly managed oral secretions.  Intubated.  Empirical abx. Weaning phenylephrine. Tolerating PSV but weak   I have personally obtained a history, examined the patient, evaluated laboratory and imaging results, formulated the assessment and plan and placed orders.  CRITICAL CARE:  The patient is critically ill with multiple organ systems failure and requires high complexity decision making for assessment and support,  frequent evaluation and titration of therapies, application of advanced monitoring technologies and extensive interpretation of multiple databases. Critical Care Time devoted to patient care services described in this note is 40 minutes.   Levy Pupa, MD, PhD 11/16/2012, 9:15 AM [facility] [phone] or if no answer [phone]

## 2012-11-16 NOTE — Progress Notes (Signed)
[facility] ICU Electrolyte Replacement Protocol  Patient Name: Jenny Brady DOB: 1925/05/19 MRN: [id number]  Date of Service  11/16/2012   HPI/Events of Note    Recent Labs Lab 11/12/12 0350  11/13/12 2328 11/14/12 0500 11/14/12 1700 11/15/12 0500 11/16/12 0500  NA 163*  < > 145 143 146* 145 141  K 2.6*  < > 3.9 4.3 3.3* 4.0 3.1*  CL 128*  < > 115* 114* 117* 115* 109  CO2 26  < > 23 23 23 24 24   GLUCOSE 213*  < > 121* 138* 110* 169* 140*  BUN 49*  < > 20 18 14 15 13   CREATININE 0.63  < > 0.39* 0.37* 0.34* 0.36* 0.34*  CALCIUM 8.0*  < > 8.1* 8.2* 8.0* 7.9* 8.2*  MG 2.3  --   --  2.0  --   --  1.9  PHOS 2.8  --   --  3.0  --   --  3.0  < > = values in this interval not displayed.  Estimated Creatinine Clearance: 52.6 ml/min (by C-G formula based on Cr of 0.34).  Intake/Output     08/08 0701 - 08/09 0700   I.V. (mL/kg) 1060.2 (13.2)   NG/GT 560   Total Intake(mL/kg) 1620.2 (20.1)   Urine (mL/kg/hr) 1421 (0.7)   Stool 1 (0)   Total Output 1422   Net +198.2        - I/O DETAILED x24h    Total I/O In: 468.6 [I.V.:293.6; NG/GT:175] Out: 375 [Urine:375] - I/O THIS SHIFT    ASSESSMENT   e[facility] Interventions  K+ 3.1 Electrolyte protocol criteria met. Electrolytes replaced per protocol. MD notified.     ASSESSMENT: MAJOR ELECTROLYTE    Merita Norton 11/16/2012, 5:53 AM

## 2012-11-17 ENCOUNTER — Inpatient Hospital Stay (HOSPITAL_COMMUNITY): Payer: Medicare Other

## 2012-11-17 LAB — CBC
HCT: 28.3 % — ABNORMAL LOW (ref 36.0–46.0)
Hemoglobin: 9.2 g/dL — ABNORMAL LOW (ref 12.0–15.0)
RBC: 2.99 MIL/uL — ABNORMAL LOW (ref 3.87–5.11)
WBC: 7.3 10*3/uL (ref 4.0–10.5)

## 2012-11-17 LAB — GLUCOSE, CAPILLARY
Glucose-Capillary: 126 mg/dL — ABNORMAL HIGH (ref 70–99)
Glucose-Capillary: 132 mg/dL — ABNORMAL HIGH (ref 70–99)

## 2012-11-17 LAB — BASIC METABOLIC PANEL
Chloride: 106 mEq/L (ref 96–112)
GFR calc Af Amer: >90 mL/min (ref >90)
Potassium: 3.4 mEq/L — ABNORMAL LOW (ref 3.5–5.1)

## 2012-11-17 NOTE — Progress Notes (Signed)
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  In ED intubate for airway protection / hypoxemia.  Large amount of pooled secretions noted obstructing oropharynx during intubation. R heel hematoma debrided by EDP. Metabolic disarray.  PCCM was consulted.  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  In ED intubate for airway protection / hypoxemia.  Large amount of pooled secretions noted obstructing oropharynx during intubation. R heel hematoma  debrided by EDP. PCCM was consulted.   INTERVAL HISTORY: remains on phenylephrine Tolerating PSV, poor cough  VITAL SIGNS: Temp:  [98.7 F (37.1 C)-100.6 F (38.1 C)] 99.8 F (37.7 C) (08/10 0737) Pulse Rate:  [67-93] 81 (08/10 0900) Resp:  [14-21] 21 (08/10 0900) BP: (91-113)/(43-62) 100/51 mmHg (08/10 0900) SpO2:  [99 %-100 %] 99 % (08/10 0900) FiO2 (%):  [40 %] 40 % (08/10 0730) Weight:  [75.3 kg (166 lb 0.1 oz)] 75.3 kg (166 lb 0.1 oz) (08/10 0400) HEMODYNAMICS: CVP:  [4 mmHg-5 mmHg] 4 mmHg VENTILATOR SETTINGS: Vent Mode:  [-] PSV;CPAP FiO2 (%):  [40 %] 40 % Set Rate:  [14 bmp] 14 bmp Vt Set:  [500 mL] 500  mL PEEP:  [5 cmH20] 5 cmH20 Pressure Support:  [5 cmH20-8 cmH20] 5 cmH20 Plateau Pressure:  [12 cmH20-18 cmH20] 13 cmH20  INTAKE / OUTPUT: Intake/Output     08/09 0701 - 08/10 0700 08/10 0701 - 08/11 0700   I.V. (mL/kg) 1695.5 (22.5) 115 (1.5)   Other 60    NG/GT 1240 35   Total Intake(mL/kg) 2995.5 (39.8) 150 (2)   Urine (mL/kg/hr) 4270 (2.4)    Stool 1 (0)    Total Output 4271     Net -1275.6 +150        Stool Occurrence 1 x      PHYSICAL EXAMINATION: General:  Mechanically ventilated, synchronous Neuro:  Well sedated, cough / gag diminished HEENT:  PERRL, OETT, poor oral hygiene  Cardiovascular:  RRR, no m/r/g Lungs:  Bilateral diminished air entry, no w/r/r Abdomen:  Soft, nontender, bowel sounds diminished, PEG site intact, chronic Foley Musculoskeletal:  Trace edema BL LE Skin:  Debrided hematoma R heel  LABS:  Recent Labs Lab 11/12/12 0350  11/14/12 0500 11/14/12 1700 11/15/12 0500 11/16/12 0500 11/17/12 0500  NA 163*  < > 143 146* 145 141 140  K 2.6*  < > 4.3 3.3* 4.0 3.1* 3.4*  CL 128*  < > 114* 117* 115* 109 106  CO2 26  < > 23 23 24 24 26   GLUCOSE 213*  < > 138* 110* 169* 140* 138*  BUN 49*  < > 18 14 15 13 13   CREATININE  0.63  < > 0.37* 0.34* 0.36* 0.34* 0.34*  CALCIUM 8.0*  < > 8.2* 8.0* 7.9* 8.2* 8.1*  MG 2.3  --  2.0  --   --  1.9  --   PHOS 2.8  --  3.0  --   --  3.0  --   < > = values in this interval not displayed.  Recent Labs Lab 11/15/12 0500 11/16/12 0500 11/17/12 0500  HGB 9.4* 9.8* 9.2*  HCT 29.6* 30.2* 28.3*  WBC 6.2 7.4 7.3  PLT 246 233 295    Recent Labs Lab 11/11/12 2120 11/16/12 0500  AST 20 16  ALT 10 11  ALKPHOS 67 64  BILITOT 0.5 0.3  PROT 8.5* 5.8*  ALBUMIN 2.3* 1.5*    Recent Labs Lab 11/11/12 2213 11/12/12 0105  PHART 7.346* 7.422  PCO2ART 57.2* 39.3  PO2ART 95.0 156.0*  HCO3 30.7* 25.2*  TCO2 32 26.4  O2SAT 96.0 99.2    Recent Labs Lab 11/16/12 1509 11/16/12 1937 11/16/12 2339 11/17/12 0355  11/17/12 0713  GLUCAP 121* 106* 126* 132* 125*    CXR:  8/9 > mild L basilar atx, ETT 1cm above carina  ASSESSMENT / PLAN:  PULMONARY A:  Acute respiratory failure likely secondary to obstruction of oropharynx with secretions. P:   Full mechanical support Daily SBT, tolerating some PSV, goal extubate as soon as MS and oropharyngeal strength allow. Will need to discuss with her HCPOA whether we would re-intubate if we do extubate.  Albuterol PRN  CARDIOVASCULAR A: Hypotension in setting of dehydration / sedation / positive pressure ventilation. Consider also septic shock although source unclear P:  Goal MAP>60, supporting with IVF, phenylephrine. Still unable to wean phenylephrine completely, slowly decreasing IVF's for goal CVP > 10 Preadmission Zocor ordered  RENAL A:  Dehydration.   Hypernatremia. Hypokalemia P:   Trend BMP qd Restarted home lasix 40 per tube qd 8/8 Continue free water Potassium relaced  GASTROINTESTINAL A:  Malnutrition.   Dysphagia, S/p PEG. Malfunctioning PEG, replaced 8/7 P:   Continue preadmission TF and supplements per new PEG Continue preadmission Protonix  HEMATOLOGIC A:  No active issues. P:  Trend CBC Heparin for DVT Px  INFECTIOUS A:  No clear infectious source. Pct 0.24 on 8/5  Recent Labs Lab 11/12/12 0113 11/12/12 0350 11/13/12 0400  PROCALCITON 0.19 0.24 0.14  P:   Replaced chronic Foley 8/4 Cultures and empiric antibiotics as above, hesitate to d/c abx altogether given pressor needs. Pct is reassuring. Stopped vanco on 8/8 and following  ENDOCRINE  A:  Hyperglycemia.  Hypothyroidism. P:   ICU Glycemic Control Protocol Continue Levothyroxine   NEUROLOGIC A:  H/o CVA, dementia. Acute encephalopathy. Question seizure activity on previous ED visit  P:   Goal RASS 0 to -1 Fentanyl PRN Continue Namenda Discussed case w Dr Pearlean Brownie 8/6 > she had ? Seizure activity, continued movement of her LE. underwent EEG in his  office that showed no seizure focus. He agrees w stopping the keppra and follow  TODAY'S SUMMARY: 79 yo NH patient with CVA / dysphagia / PEG brought in with severe dehydration and respiratory distress likely due to poorly managed oral secretions.  Intubated.  Empirical abx. Weaning phenylephrine. Tolerating PSV but weak   I have personally obtained a history, examined the patient, evaluated laboratory and imaging results, formulated the assessment and plan and placed orders.  CRITICAL CARE:  The patient is critically ill with multiple organ systems failure and requires high complexity decision making  for assessment and support, frequent evaluation and titration of therapies, application of advanced monitoring technologies and extensive interpretation of multiple databases. Critical Care Time devoted to patient care services described in this note is 40 minutes.   Levy Pupa, MD, PhD 11/17/2012, 11:14 AM [facility] [phone] or if no answer [phone]

## 2012-11-18 DIAGNOSIS — R269 Unspecified abnormalities of gait and mobility: Secondary | ICD-10-CM

## 2012-11-18 DIAGNOSIS — D638 Anemia in other chronic diseases classified elsewhere: Secondary | ICD-10-CM

## 2012-11-18 DIAGNOSIS — R918 Other nonspecific abnormal finding of lung field: Secondary | ICD-10-CM

## 2012-11-18 LAB — GLUCOSE, CAPILLARY
Glucose-Capillary: 109 mg/dL — ABNORMAL HIGH (ref 70–99)
Glucose-Capillary: 127 mg/dL — ABNORMAL HIGH (ref 70–99)
Glucose-Capillary: 134 mg/dL — ABNORMAL HIGH (ref 70–99)

## 2012-11-18 LAB — POCT I-STAT 3, ART BLOOD GAS (G3+)
O2 Saturation: 100 %
pCO2 arterial: 32.8 mmHg — ABNORMAL LOW (ref 35.0–45.0)
pH, Arterial: 7.491 — ABNORMAL HIGH (ref 7.350–7.450)

## 2012-11-18 LAB — BASIC METABOLIC PANEL
CO2: 27 mEq/L (ref 19–32)
Calcium: 8.1 mg/dL — ABNORMAL LOW (ref 8.4–10.5)
Creatinine, Ser: 0.32 mg/dL — ABNORMAL LOW (ref 0.50–1.10)
Glucose, Bld: 107 mg/dL — ABNORMAL HIGH (ref 70–99)
Sodium: 138 mEq/L (ref 135–145)

## 2012-11-18 LAB — CULTURE, BLOOD (ROUTINE X 2): Culture: NO GROWTH

## 2012-11-18 LAB — PHOSPHORUS: Phosphorus: 3.4 mg/dL (ref 2.3–4.6)

## 2012-11-18 MED ORDER — POTASSIUM CHLORIDE 20 MEQ/15ML (10%) PO LIQD
40.0000 meq | ORAL | Status: AC
  Administered 2012-11-18 (×2): 40 meq
  Filled 2012-11-18 (×2): qty 30

## 2012-11-18 MED ORDER — POTASSIUM CHLORIDE 20 MEQ/15ML (10%) PO LIQD
ORAL | Status: AC
  Administered 2012-11-18: 40 meq
  Filled 2012-11-18: qty 30

## 2012-11-18 MED ORDER — POTASSIUM CHLORIDE 20 MEQ/15ML (10%) PO LIQD
ORAL | Status: AC
  Filled 2012-11-18: qty 30

## 2012-11-18 MED ORDER — POTASSIUM CHLORIDE 20 MEQ/15ML (10%) PO LIQD
40.0000 meq | ORAL | Status: AC
  Administered 2012-11-18: 40 meq
  Filled 2012-11-18 (×2): qty 30

## 2012-11-18 NOTE — Progress Notes (Signed)
NUTRITION FOLLOW UP  Intervention:    1. Continue Jevity 1.2 @ 35 ml/hr  2. Continue 30 ml Prostat TID.  3. Continue MVI daily  At goal rate tube feeding regimen will provide 1308 kcal (97% of needs), 91 grams protein (> 100% of needs), and 681 ml H2O.  Nutrition Dx:   Inadequate oral intake related to inability to eat as evidenced by NPO status; ongoing.    Goal:  Pt to meet >/= 90% of their estimated nutrition needs, met.   Monitor:  Vent status, TF tolerance, weight trend, labs   Assessment:   Pt with past medical history of recent CVA with resulting R hemiplegia and aphagia, s/p PEG placement (07/2012) and chronic Foley cath brought to ED with altered mental status and hypoxia.  Patient is currently intubated on ventilator support. Pt with hypokalemia which is being replaced. Pt discussed during ICU rounds and with RN.  MV: 7.8 L/min Temp:Temp (24hrs), Avg:99.4 F (37.4 C), Min:98.8 F (37.1 C), Max:100.4 F (38 C)  PEG malfuctioned and replaced 8/7.  Patient has PEG in place. Jevity 1.2 is infusing @ 35 ml/hr. 30 ml Prostat via tube TID. Tube feeding regimen currently providing 1308 kcal (97% of needs), 91 grams protein (> 100% of needs), and 681 ml H2O.  Free water flushes: NA  Total free water: 681 ml per day.  Residuals: 0  Height: Ht Readings from Last 1 Encounters:  11/12/12 5\' 3"  (1.6 m)    Weight Status:   Wt Readings from Last 1 Encounters:  11/18/12 169 lb 1.5 oz (76.7 kg)  Admission weight 159 lb 1-3+ edema noted  Re-estimated needs:  Kcal: 1343 Protein: >/= 87 grams Fluid: > 1.5 L/day  Skin: stage II sacrum, DTI on right heel, DTI on right shoulder  Diet Order:   NPO   Intake/Output Summary (Last 24 hours) at 11/18/12 1122 Last data filed at 11/18/12 0800  Gross per 24 hour  Intake 2595.8 ml  Output   2550 ml  Net   45.8 ml    Last BM: 8/11   Labs:   Recent Labs Lab 11/14/12 0500  11/16/12 0500 11/17/12 0500 11/18/12 0330   NA 143  < > 141 140 138  K 4.3  < > 3.1* 3.4* 3.2*  CL 114*  < > 109 106 104  CO2 23  < > 24 26 27   BUN 18  < > 13 13 14   CREATININE 0.37*  < > 0.34* 0.34* 0.32*  CALCIUM 8.2*  < > 8.2* 8.1* 8.1*  MG 2.0  --  1.9  --  2.0  PHOS 3.0  --  3.0  --  3.4  GLUCOSE 138*  < > 140* 138* 107*  < > = values in this interval not displayed.  CBG (last 3)   Recent Labs  11/17/12 1923 11/17/12 2342 11/18/12 0343  GLUCAP 113* 134* 106*   Lab Results  Component Value Date   HGBA1C 6.2* 10/19/2012    Scheduled Meds: . antiseptic oral rinse  1 application Mouth Rinse QID  . chlorhexidine  15 mL Mouth/Throat BID  . cholecalciferol  2,000 Units Oral Daily  . cyanocobalamin  500 mcg Per Tube Daily  . feeding supplement (JEVITY 1.2 CAL)  1,000 mL Per Tube Q24H  . feeding supplement  30 mL Per Tube TID  . heparin  5,000 Units Subcutaneous Q8H  . insulin aspart  0-15 Units Subcutaneous Q4H  . levothyroxine  88 mcg Per Tube QAC  breakfast  . memantine  5 mg Per Tube BID  . multivitamin with minerals  1 tablet Per Tube Daily  . pantoprazole sodium  40 mg Per Tube Daily  . potassium chloride  40 mEq Per Tube Q4H  . simvastatin  20 mg Oral q1800    Continuous Infusions: . sodium chloride 50 mL/hr at 11/18/12 0248  . phenylephrine (NEO-SYNEPHRINE) Adult infusion Stopped (11/18/12 0945)    Kendell Bane RD, LDN, CNSC [phone] Pager [phone] After Hours Pager

## 2012-11-18 NOTE — Progress Notes (Signed)
PULMONARY  / CRITICAL CARE MEDICINE  Name: Jenny Brady MRN: [id number] DOB: [birth year]    ADMISSION DATE:  11/11/2012 CONSULTATION DATE:  11/11/2012  REFERRING MD :  EDP PRIMARY SERVICE:  PCCM  CHIEF COMPLAINT:  Acute respiratory failure  BRIEF PATIENT DESCRIPTION: 79 yo with past medical history of recent CVA with resulting R hemiplegia and aphagia, s/p PEG placement and chronic Foley cath brought to ED with altered mental status and hypoxia.  In ED intubate for airway protection / hypoxemia.  Large amount of pooled secretions noted obstructing oropharynx during intubation. R heel hematoma debrided by EDP. Metabolic disarray.  PCCM was consulted.  SIGNIFICANT EVENTS / STUDIES:  Replaced malpositioned PEG 8/7  LINES / TUBES: OETT 8/4 >>> PEG prior admission >> 8/7 New PEG 8/7 >>   CULTURES: 8/4 Blood >>> 8/4 Urine >>> negative  ANTIBIOTICS: Zosyn 8/4 >>>off Vancomycin 8/4 >>> 8/8  INTERVAL HISTORY: Still weaning neo. Weaning on vent   VITAL SIGNS: Temp:  [98.8 F (37.1 C)-100.4 F (38 C)] 99.5 F (37.5 C) (08/11 0815) Pulse Rate:  [72-95] 86 (08/11 0800) Resp:  [14-23] 20 (08/11 0800) BP: (86-122)/(40-71) 103/60 mmHg (08/11 0800) SpO2:  [97 %-100 %] 100 % (08/11 0800) FiO2 (%):  [35 %-40 %] 40 % (08/11 0800) Weight:  [169 lb 1.5 oz (76.7 kg)] 169 lb 1.5 oz (76.7 kg) (08/11 0437) HEMODYNAMICS: CVP:  [4 mmHg-7 mmHg] 5 mmHg VENTILATOR SETTINGS: Vent Mode:  [-] CPAP;SIMV/PC/PS FiO2 (%):  [35 %-40 %] 40 % Set Rate:  [14 bmp] 14 bmp Vt Set:  [500 mL] 500 mL PEEP:  [5 cmH20] 5 cmH20 Pressure Support:  [5 cmH20-8 cmH20] 5 cmH20 Plateau Pressure:  [14 cmH20-17 cmH20] 16 cmH20  INTAKE / OUTPUT: Intake/Output     08/10 0701 - 08/11 0700 08/11 0701 - 08/12 0700   I.V. (mL/kg) 1481.8 (19.3) 59 (0.8)   Other 150 30   NG/GT 1240 35   Total Intake(mL/kg) 2871.8 (37.4) 124 (1.6)   Urine (mL/kg/hr) 2600 (1.4) 250 (2.1)   Stool     Total Output 2600 250   Net +271.8 -126           PHYSICAL EXAMINATION: General:  Mechanically ventilated, synchronous Neuro:  Well sedated, cough / gag diminished HEENT:  PERRL, OETT, poor oral hygiene  Cardiovascular:  RRR, no m/r/g Lungs:  Bilateral diminished air entry, no w/r/r Abdomen:  Soft, nontender, bowel sounds diminished, PEG site intact, chronic Foley Musculoskeletal:  Trace edema BL LE. +2 Upper extremity edema Skin:  Debrided hematoma R heel  LABS:  Recent Labs Lab 11/12/12 0350  11/14/12 0500 11/14/12 1700 11/15/12 0500 11/16/12 0500 11/17/12 0500 11/18/12 0330  NA 163*  < > 143 146* 145 141 140 138  K 2.6*  < > 4.3 3.3* 4.0 3.1* 3.4* 3.2*  CL 128*  < > 114* 117* 115* 109 106 104  CO2 26  < > 23 23 24 24 26 27   GLUCOSE 213*  < > 138* 110* 169* 140* 138* 107*  BUN 49*  < > 18 14 15 13 13 14   CREATININE 0.63  < > 0.37* 0.34* 0.36* 0.34* 0.34* 0.32*  CALCIUM 8.0*  < > 8.2* 8.0* 7.9* 8.2* 8.1* 8.1*  MG 2.3  --  2.0  --   --  1.9  --  2.0  PHOS 2.8  --  3.0  --   --  3.0  --  3.4  < > = values in this interval  not displayed.  Recent Labs Lab 11/15/12 0500 11/16/12 0500 11/17/12 0500  HGB 9.4* 9.8* 9.2*  HCT 29.6* 30.2* 28.3*  WBC 6.2 7.4 7.3  PLT 246 233 295    Recent Labs Lab 11/11/12 2120 11/16/12 0500  AST 20 16  ALT 10 11  ALKPHOS 67 64  BILITOT 0.5 0.3  PROT 8.5* 5.8*  ALBUMIN 2.3* 1.5*    Recent Labs Lab 11/11/12 2213 11/12/12 0105  PHART 7.346* 7.422  PCO2ART 57.2* 39.3  PO2ART 95.0 156.0*  HCO3 30.7* 25.2*  TCO2 32 26.4  O2SAT 96.0 99.2    Recent Labs Lab 11/17/12 1112 11/17/12 1503 11/17/12 1923 11/17/12 2342 11/18/12 0343  GLUCAP 126* 100* 113* 134* 106*    CXR:  8/10 , none new  ASSESSMENT / PLAN:  PULMONARY A:  Acute respiratory failure likely secondary to obstruction of oropharynx with secretions. P:   -weaning cpap5 ps 5, goal 2 hrs, assess rsbi, abg -assess airway protection skills -will d/w med poa, reintubation wishes, would be medically  ineffective for trach -chair position -neg balance, edema noted on PE  CARDIOVASCULAR A: Hypotension in setting of dehydration / sedation / positive pressure ventilation. Consider also septic shock although source unclear Hyperlipidemia  P:  -goal map would reduce with good maintenance urine output - accept map 50 with urine output 20-25 cc/hr -Patient started on Zocor   RENAL A:  Dehydration.   Hypernatremia. Hypokalemia P:   Trend BMP qd Lasix if BP allows  Dc free water Replace K  GASTROINTESTINAL A:  Malnutrition.   Dysphagia, S/p PEG. Malfunctioning PEG, replaced 8/7 P:   Continue preadmission TF and supplements per new PEG Continue preadmission Protonix May need to hold tf if weaning  HEMATOLOGIC A:  No active issues. P:  Heparin for DVT Px  INFECTIOUS A:  No clear infectious source. Pct 0.24 on 8/5  Recent Labs Lab 11/12/12 0113 11/12/12 0350 11/13/12 0400  PROCALCITON 0.19 0.24 0.14  P:   Replaced chronic Foley 8/4  ENDOCRINE  A:  Hyperglycemia.  Hypothyroidism. P:   ICU Glycemic Control Protocol Continue Levothyroxine   NEUROLOGIC A:  H/o CVA, dementia. Acute encephalopathy. Question seizure activity on previous ED visit  Discussed case w Dr Pearlean Brownie 8/6 > she had ? Seizure activity, continued movement of her LE. underwent EEG in his office that showed no seizure focus. He agrees w stopping the keppra and follow P:   Goal RASS 0 to -1 Fentanyl PRN Continue Namenda Will d/w family, prior anti plat agents Avoid benzo  I have fully examined this patient and agree with above findings.    And edited in full   Ccm time 30 min   Mcarthur Rossetti. Tyson Alias, MD, FACP Pgr: [phone] [facility]

## 2012-11-18 NOTE — Significant Event (Signed)
Hypotension on vasopressors Hypokalemia  DC furosemide Replete K+

## 2012-11-19 ENCOUNTER — Inpatient Hospital Stay (HOSPITAL_COMMUNITY): Payer: Medicare Other

## 2012-11-19 DIAGNOSIS — F411 Generalized anxiety disorder: Secondary | ICD-10-CM

## 2012-11-19 LAB — CBC
HCT: 27.3 % — ABNORMAL LOW (ref 36.0–46.0)
MCHC: 32.6 g/dL (ref 30.0–36.0)
MCV: 95.5 fL (ref 78.0–100.0)
RDW: 17 % — ABNORMAL HIGH (ref 11.5–15.5)

## 2012-11-19 LAB — BASIC METABOLIC PANEL
BUN: 16 mg/dL (ref 6–23)
Creatinine, Ser: 0.35 mg/dL — ABNORMAL LOW (ref 0.50–1.10)
GFR calc Af Amer: >90 mL/min (ref >90)
GFR calc non Af Amer: >90 mL/min (ref >90)
Potassium: 4.1 mEq/L (ref 3.5–5.1)

## 2012-11-19 LAB — GLUCOSE, CAPILLARY
Glucose-Capillary: 133 mg/dL — ABNORMAL HIGH (ref 70–99)
Glucose-Capillary: 109 mg/dL — ABNORMAL HIGH (ref 70–99)

## 2012-11-19 MED ORDER — ETOMIDATE 2 MG/ML IV SOLN
INTRAVENOUS | Status: AC
  Filled 2012-11-19: qty 10

## 2012-11-19 MED ORDER — DEXAMETHASONE SODIUM PHOSPHATE 10 MG/ML IJ SOLN
10.0000 mg | Freq: Four times a day (QID) | INTRAMUSCULAR | Status: AC
  Administered 2012-11-19 – 2012-11-20 (×4): 10 mg via INTRAVENOUS
  Filled 2012-11-19 (×4): qty 1

## 2012-11-19 MED ORDER — FUROSEMIDE 10 MG/ML IJ SOLN
10.0000 mg | Freq: Once | INTRAMUSCULAR | Status: AC
  Administered 2012-11-19: 10 mg via INTRAVENOUS
  Filled 2012-11-19: qty 1

## 2012-11-19 MED ORDER — MIDAZOLAM HCL 2 MG/2ML IJ SOLN
2.0000 mg | Freq: Once | INTRAMUSCULAR | Status: AC
  Administered 2012-11-19: 2 mg via INTRAVENOUS

## 2012-11-19 MED ORDER — MIDAZOLAM HCL 2 MG/2ML IJ SOLN
INTRAMUSCULAR | Status: AC
  Filled 2012-11-19: qty 4

## 2012-11-19 MED ORDER — ETOMIDATE 2 MG/ML IV SOLN
0.3000 mg/kg | Freq: Once | INTRAVENOUS | Status: DC
Start: 2012-11-19 — End: 2012-11-19

## 2012-11-19 MED ORDER — ETOMIDATE 2 MG/ML IV SOLN
20.0000 mg | Freq: Once | INTRAVENOUS | Status: AC
  Administered 2012-11-19: 20 mg via INTRAVENOUS

## 2012-11-19 MED ORDER — FENTANYL CITRATE 0.05 MG/ML IJ SOLN: 100.0000 ug | Freq: Once | INTRAMUSCULAR | Status: AC

## 2012-11-19 NOTE — Procedures (Signed)
Intubation Procedure Note VIA ROSADO [id number] 1926-02-24  Procedure: Intubation Indications: Respiratory insufficiency  Procedure Details Consent: Unable to obtain consent because of emergent medical necessity. Time Out: Verified patient identification, verified procedure, site/side was marked, verified correct patient position, special equipment/implants available, medications/allergies/relevent history reviewed, required imaging and test results available.  Performed  Maximum sterile technique was used including cap, gloves and gown.  MAC and 3    Evaluation Hemodynamic Status: BP stable throughout; O2 sats: stable throughout Patient's Current Condition: stable Complications: No apparent complications Patient did tolerate procedure well. Chest X-ray ordered to verify placement.  CXR: pending.   Nelda Bucks 11/19/2012  Cord swollen, stridor, cords injured  Mcarthur Rossetti. Tyson Alias, MD, FACP Pgr: [phone] [facility]

## 2012-11-19 NOTE — Procedures (Signed)
Extubation Procedure Note  Patient Details:   Name: Jenny Brady DOB: 1926/04/06 MRN: [id number]   Airway Documentation:     Evaluation  O2 sats: stable throughout Complications: No apparent complications Patient did tolerate procedure well. Bilateral Breath Sounds: Clear;Diminished Suctioning: Airway Yes  Ave Filter 11/19/2012, 9:14 AM

## 2012-11-19 NOTE — Progress Notes (Signed)
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  In ED intubate for airway protection / hypoxemia.  Large amount of pooled secretions noted obstructing oropharynx during intubation. R heel hematoma debrided by EDP. Metabolic disarray.  PCCM was consulted.  SIGNIFICANT EVENTS / STUDIES:  Replaced malpositioned PEG 8/7  LINES / TUBES: OETT 8/4 >>> PEG prior admission >> 8/7 New PEG 8/7 >>   CULTURES: 8/4 Blood >>> 8/4 Urine >>> negative  ANTIBIOTICS: Zosyn 8/4 >>>off Vancomycin 8/4 >>> 8/8  INTERVAL HISTORY:  More awake, weaning upright, neg 889 cc   VITAL SIGNS: Temp:  [98.9 F (37.2 C)-100.4 F (38 C)] 98.9 F (37.2 C) (08/12 0800) Pulse Rate:  [85-97] 95 (08/12 0734) Resp:  [13-24] 17 (08/12 0734) BP: (83-105)/(40-74) 105/59 mmHg (08/12 0734) SpO2:  [95 %-100 %] 98 % (08/12 0734) FiO2 (%):  [35 %-40 %] 40 % (08/12 0734) Weight:  [76.1 kg (167 lb 12.3 oz)] 76.1 kg (167 lb 12.3 oz) (08/12 0500) HEMODYNAMICS: CVP:  [4 mmHg-5 mmHg] 4 mmHg VENTILATOR SETTINGS: Vent Mode:  [-] CPAP;PSV FiO2 (%):  [35 %-40 %] 40 % Set Rate:  [14 bmp] 14 bmp Vt Set:  [500 mL] 500 mL PEEP:  [5 cmH20] 5 cmH20 Pressure Support:  [8 cmH20] 8 cmH20 Plateau Pressure:  [15 cmH20-16 cmH20] 15 cmH20  INTAKE / OUTPUT: Intake/Output     08/11 0701 - 08/12 0700 08/12 0701 - 08/13 0700   I.V. (mL/kg) 1222.5 (16.1)    Other 210    NG/GT 840    Total Intake(mL/kg) 2272.5 (29.9)    Urine (mL/kg/hr) 3176 (1.7)    Stool 5 (0)    Total Output 3181     Net -908.5            PHYSICAL  EXAMINATION: General:  Mechanically ventilated, synchronous Neuro:  Well sedated, cough increased HEENT:  PERRL, OETT, poor oral hygiene  Cardiovascular:  RRR, no m/r/g Lungs: CTA anterior Abdomen:  Soft, nontender, bowel sounds diminished, PEG site intact, chronic Foley Musculoskeletal:  Trace edema BL LE. +2 Upper extremity edema less Skin:  Debrided hematoma R heel  LABS:  Recent Labs Lab 11/14/12 0500  11/15/12 0500 11/16/12 0500 11/17/12 0500 11/18/12 0330 11/19/12 0400  NA 143  < > 145 141 140 138 138  K 4.3  < > 4.0 3.1* 3.4* 3.2* 4.1  CL 114*  < > 115* 109 106 104 106  CO2 23  < > 24 24 26 27 25   GLUCOSE 138*  < > 169* 140* 138* 107* 141*  BUN 18  < > 15 13 13 14 16   CREATININE 0.37*  < > 0.36* 0.34* 0.34* 0.32* 0.35*  CALCIUM 8.2*  < > 7.9* 8.2* 8.1* 8.1* 8.4  MG 2.0  --   --  1.9  --  2.0  --   PHOS 3.0  --   --  3.0  --  3.4  --   < > = values in this interval not displayed.  Recent Labs Lab 11/16/12 0500 11/17/12 0500 11/19/12 0400  HGB 9.8* 9.2* 8.9*  HCT 30.2* 28.3* 27.3*  WBC 7.4 7.3 7.8  PLT 233 295 268    Recent Labs Lab 11/16/12 0500  AST 16  ALT 11  ALKPHOS 64  BILITOT 0.3  PROT 5.8*  ALBUMIN 1.5*    Recent Labs Lab 11/18/12 1151  PHART 7.491*  PCO2ART 32.8*  PO2ART 159.0*  HCO3 25.0*  TCO2 26  O2SAT 100.0    Recent Labs Lab 11/18/12 1134 11/18/12 1547 11/18/12 1958 11/18/12 2343 11/19/12 0423  GLUCAP 127* 109* 117* 110* 138*    CXR:  8/12 , no defined infiltrates  ASSESSMENT / PLAN:  PULMONARY A:  Acute respiratory failure likely secondary to obstruction of oropharynx with secretions. P:   -weaning cpap5 ps 5, goal 30 min , will call med poa to discuss options -assess airway protection skills -will d/w med poa, reintubation wishes, would be medically ineffective for trach -chair position -neg balance successful -ABg on wean noted  CARDIOVASCULAR A: Hypotension in setting of dehydration / sedation / positive  pressure ventilation. Consider also septic shock although source unclear Hyperlipidemia  P:  -goal map would reduce with good maintenance urine output - accept map 50 with urine output 20-25 cc/hr - off pressors -Patient started on Zocor   RENAL A:  Dehydration.   Hypernatremia. Hypokalemia P:   Trend BMP qd Lasix 10 mg iv  X 1, same goals neg 1 liter  GASTROINTESTINAL A:  Malnutrition.   Dysphagia, S/p PEG. Malfunctioning PEG, replaced 8/7 P:   Tf to hold, weaning Protonix  HEMATOLOGIC A:  No active issues. P:  Heparin for DVT Px Limit phlebotomy  INFECTIOUS A:  No clear infectious source. Pct 0.24 on 8/5  Recent Labs Lab 11/13/12 0400  PROCALCITON 0.14  P:   Replaced chronic Foley 8/4 Follow temp curve  ENDOCRINE  A:  Hyperglycemia.  Hypothyroidism. P:   ICU Glycemic Control Protocol Continue Levothyroxine   NEUROLOGIC A:  H/o CVA, dementia. Acute encephalopathy. Question seizure activity on previous ED visit  Discussed case w Dr Pearlean Brownie 8/6 > she had ? Seizure activity, continued movement of her LE. underwent EEG in his office that showed no seizure focus. He agrees w stopping the keppra and follow P:   Goal RASS 0 to -1 Fentanyl PRN Continue Namenda Prior bleeds prevents restart anti plat?, will calrify with neuro Avoid benzo Chair posiotion  I have fully examined this patient and agree with above findings.    And edited in full   Ccm time 30 min   Mcarthur Rossetti. Tyson Alias, MD, FACP Pgr: [phone] [facility]

## 2012-11-19 NOTE — Progress Notes (Signed)
I have had extensive discussions with family Aliea Bobe. We discussed patients current circumstances and organ failures. We also discussed patient's prior wishes under circumstances such as this. Family has decided to NOT perform resuscitation  Or shock if arrest but to continue current medical support for now. "chemical ACLS" is wished reintbuation ok , but trach unlikely in future   Mcarthur Rossetti. Tyson Alias, MD, FACP Pgr: [phone]  [facility] Care

## 2012-11-19 NOTE — Progress Notes (Signed)
ETT retracted 2.5cm per MD order. No complications. Pt stable at this time. RT will continue to monitor.

## 2012-11-20 LAB — BASIC METABOLIC PANEL
BUN: 21 mg/dL (ref 6–23)
Calcium: 8.6 mg/dL (ref 8.4–10.5)
Creatinine, Ser: 0.31 mg/dL — ABNORMAL LOW (ref 0.50–1.10)
GFR calc Af Amer: >90 mL/min (ref >90)
GFR calc non Af Amer: >90 mL/min (ref >90)

## 2012-11-20 LAB — GLUCOSE, CAPILLARY
Glucose-Capillary: 186 mg/dL — ABNORMAL HIGH (ref 70–99)
Glucose-Capillary: 201 mg/dL — ABNORMAL HIGH (ref 70–99)
Glucose-Capillary: 172 mg/dL — ABNORMAL HIGH (ref 70–99)
Glucose-Capillary: 161 mg/dL — ABNORMAL HIGH (ref 70–99)

## 2012-11-20 MED ORDER — FENTANYL CITRATE 0.05 MG/ML IJ SOLN
200.0000 ug | Freq: Once | INTRAMUSCULAR | Status: AC
  Administered 2012-11-21: 200 ug via INTRAVENOUS
  Filled 2012-11-20: qty 4

## 2012-11-20 MED ORDER — VECURONIUM BROMIDE 10 MG IV SOLR
10.0000 mg | Freq: Once | INTRAVENOUS | Status: DC
  Filled 2012-11-20: qty 10

## 2012-11-20 MED ORDER — ETOMIDATE 2 MG/ML IV SOLN
40.0000 mg | Freq: Once | INTRAVENOUS | Status: AC
  Administered 2012-11-21: 20 mg via INTRAVENOUS
  Filled 2012-11-20: qty 20

## 2012-11-20 MED ORDER — MIDAZOLAM HCL 2 MG/2ML IJ SOLN: 4.0000 mg | Freq: Once | INTRAMUSCULAR | Status: DC

## 2012-11-20 MED ORDER — MIDAZOLAM HCL 2 MG/2ML IJ SOLN
4.0000 mg | Freq: Once | INTRAMUSCULAR | Status: AC
  Administered 2012-11-21: 4 mg via INTRAVENOUS
  Filled 2012-11-20: qty 4

## 2012-11-20 MED ORDER — PROPOFOL 10 MG/ML IV EMUL: 5.0000 ug/kg/min | Freq: Once | INTRAVENOUS | Status: DC

## 2012-11-20 MED ORDER — ETOMIDATE 2 MG/ML IV SOLN
40.0000 mg | Freq: Once | INTRAVENOUS | Status: DC
  Filled 2012-11-20: qty 20

## 2012-11-20 MED ORDER — FENTANYL CITRATE 0.05 MG/ML IJ SOLN: 200.0000 ug | Freq: Once | INTRAMUSCULAR | Status: DC

## 2012-11-20 MED ORDER — FUROSEMIDE 10 MG/ML IJ SOLN
20.0000 mg | Freq: Once | INTRAMUSCULAR | Status: AC
  Administered 2012-11-20: 20 mg via INTRAVENOUS
  Filled 2012-11-20: qty 2

## 2012-11-20 NOTE — Progress Notes (Signed)
Inpatient Diabetes Program Recommendations  AACE/ADA: New Consensus Statement on Inpatient Glycemic Control (2013)  Target Ranges:  Prepandial:   less than 140 mg/dL      Peak postprandial:   less than 180 mg/dL (1-2 hours)      Critically ill patients:  140 - 180 mg/dL    While on Decadron, tube feeds tend to elevate glucose levels:  Inpatient Diabetes Program Recommendations Correction (SSI): Start TF novolog coverage (per below) and decrease correction to sensttive correction q 4 hrs.  Insulin - Meal Coverage: While on Decadron, please cover tube feedings with 3 units q 4 hrs. (this will prevent elevations when correction is not needed)  Thank you, Lenor Coffin, RN, CNS, Diabetes Coordinator [phone])

## 2012-11-20 NOTE — Progress Notes (Signed)
PULMONARY  / CRITICAL CARE MEDICINE  Name: Jenny Brady MRN: [id number] DOB: 1926-03-16    ADMISSION DATE:  11/11/2012 CONSULTATION DATE:  11/11/2012  REFERRING MD :  EDP PRIMARY SERVICE:  PCCM  CHIEF COMPLAINT:  Acute respiratory failure  BRIEF PATIENT DESCRIPTION: 79 yo with past medical history of recent CVA with resulting R hemiplegia and aphagia, s/p PEG placement and chronic Foley cath brought to ED with altered mental status and hypoxia.  In ED intubate for airway protection / hypoxemia.  Large amount of pooled secretions noted obstructing oropharynx during intubation. R heel hematoma debrided by EDP. Metabolic disarray.  PCCM was consulted.  SIGNIFICANT EVENTS / STUDIES:  Replaced malpositioned PEG 8/7 8/12- extubated, reintubnated in 30 min , dramatic cord edema  LINES / TUBES: OETT 8/4 >>> PEG prior admission >> 8/7 New PEG 8/7 >>   CULTURES: 8/4 Blood >>> 8/4 Urine >>> negative  ANTIBIOTICS: Zosyn 8/4 >>>off Vancomycin 8/4 >>> 8/8  INTERVAL HISTORY:  Re intubated, calm, steroids   VITAL SIGNS: Temp:  [97.5 F (36.4 C)-99.5 F (37.5 C)] 99.5 F (37.5 C) (08/13 0828) Pulse Rate:  [73-94] 89 (08/13 1100) Resp:  [14-24] 16 (08/13 1100) BP: (87-120)/(44-68) 118/61 mmHg (08/13 1100) SpO2:  [93 %-100 %] 100 % (08/13 1100) FiO2 (%):  [30 %-35 %] 30 % (08/13 0727) HEMODYNAMICS:   VENTILATOR SETTINGS: Vent Mode:  [-] PSV FiO2 (%):  [30 %-35 %] 30 % Set Rate:  [14 bmp] 14 bmp Vt Set:  [420 mL] 420 mL PEEP:  [5 cmH20] 5 cmH20 Pressure Support:  [5 cmH20] 5 cmH20 Plateau Pressure:  [11 cmH20-15 cmH20] 11 cmH20  INTAKE / OUTPUT: Intake/Output     08/12 0701 - 08/13 0700 08/13 0701 - 08/14 0700   I.V. (mL/kg) 1200 (15.8) 200 (2.6)   Other 90    NG/GT 714.6 200   Total Intake(mL/kg) 2004.6 (26.3) 400 (5.3)   Urine (mL/kg/hr) 2145 (1.2) 70 (0.2)   Stool     Total Output 2145 70   Net -140.4 +330        Stool Occurrence 2 x      PHYSICAL  EXAMINATION: General:  Mechanically ventilated, synchronous Neuro:  rass 0, not following comands HEENT:  PERRL, OETT, poor oral hygiene  Cardiovascular:  RRR, no m/r/g Lungs: CTA anterior Abdomen:  Soft, nontender, bowel sounds diminished, PEG site intact, chronic Foley Musculoskeletal:  Trace edema BL LE. +2 Upper extremity edema less Skin:  Debrided hematoma R heel  LABS:  Recent Labs Lab 11/14/12 0500  11/16/12 0500 11/17/12 0500 11/18/12 0330 11/19/12 0400 11/20/12 0444  NA 143  < > 141 140 138 138 144  K 4.3  < > 3.1* 3.4* 3.2* 4.1 3.7  CL 114*  < > 109 106 104 106 109  CO2 23  < > 24 26 27 25 26   GLUCOSE 138*  < > 140* 138* 107* 141* 201*  BUN 18  < > 13 13 14 16 21   CREATININE 0.37*  < > 0.34* 0.34* 0.32* 0.35* 0.31*  CALCIUM 8.2*  < > 8.2* 8.1* 8.1* 8.4 8.6  MG 2.0  --  1.9  --  2.0  --  2.1  PHOS 3.0  --  3.0  --  3.4  --  3.5  < > = values in this interval not displayed.  Recent Labs Lab 11/16/12 0500 11/17/12 0500 11/19/12 0400  HGB 9.8* 9.2* 8.9*  HCT 30.2* 28.3* 27.3*  WBC 7.4 7.3 7.8  PLT 233 295 268    Recent Labs Lab 11/16/12 0500  AST 16  ALT 11  ALKPHOS 64  BILITOT 0.3  PROT 5.8*  ALBUMIN 1.5*    Recent Labs Lab 11/18/12 1151  PHART 7.491*  PCO2ART 32.8*  PO2ART 159.0*  HCO3 25.0*  TCO2 26  O2SAT 100.0    Recent Labs Lab 11/19/12 1600 11/19/12 1937 11/19/12 2330 11/20/12 0343 11/20/12 0820  GLUCAP 201* 206* 211* 186* 201*    CXR:  none today  ASSESSMENT / PLAN:  PULMONARY A:  Acute respiratory failure likely secondary to obstruction of oropharynx with secretions. 8/12 stridor, cord edema P:   -weaning cpap5 ps 5, goal 30 min -decadron x 4 doses -consider extubation in am with dni and hope for success with sdteroids -will d/w med poa, reintubation wishes, would be medically ineffective for trach, will again call and plan meeting -chair position  CARDIOVASCULAR A: Hypotension in setting of dehydration /  sedation / positive pressure ventilation. Consider also septic shock although source unclear Hyperlipidemia  P:  -BP doing well with lasix  -Zocor   RENAL A:  Dehydration.   Hypernatremia. Hypokalemia P:   Trend BMP in am  Even to neg balance as goal, lasix x 20 mg x 1  GASTROINTESTINAL A:  Malnutrition.   Dysphagia, S/p PEG. Malfunctioning PEG, replaced 8/7 P:   Tf to hold, weaning Protonix  HEMATOLOGIC A dvt prevention P:  Heparin for DVT Px Limit phlebotomy  INFECTIOUS A:  No clear infectious source. Pct 0.24 on 8/5 No results found for this basename: PROCALCITON,  in the last 168 hoursP:   Replaced chronic Foley 8/4 Follow temp curve  ENDOCRINE  A:  Hyperglycemia.  Hypothyroidism. P:   ICU Glycemic Control Protocol Continue Levothyroxine   NEUROLOGIC A:  H/o CVA, dementia. Acute encephalopathy. Question seizure activity on previous ED visit  Discussed case w Dr Pearlean Brownie 8/6 > she had ? Seizure activity, continued movement of her LE. underwent EEG in his office that showed no seizure focus. He agrees w stopping the keppra and follow P:   Goal RASS 0 to -1 Fentanyl PRN Continue Namenda Avoid benzo Chair posiotion, successful  Global: reintubated stridor, steroids, weaning, trach would be futile, will d/w fam  I have fully examined this patient and agree with above findings.    And edited in full   Ccm time 30 min   Mcarthur Rossetti. Tyson Alias, MD, FACP Pgr: [phone] [facility]

## 2012-11-21 ENCOUNTER — Inpatient Hospital Stay (HOSPITAL_COMMUNITY): Payer: Medicare Other

## 2012-11-21 DIAGNOSIS — I2510 Atherosclerotic heart disease of native coronary artery without angina pectoris: Secondary | ICD-10-CM

## 2012-11-21 DIAGNOSIS — I5022 Chronic systolic (congestive) heart failure: Secondary | ICD-10-CM

## 2012-11-21 DIAGNOSIS — I509 Heart failure, unspecified: Secondary | ICD-10-CM

## 2012-11-21 LAB — BASIC METABOLIC PANEL
CO2: 29 mEq/L (ref 19–32)
Chloride: 107 mEq/L (ref 96–112)
GFR calc Af Amer: >90 mL/min (ref >90)
Sodium: 144 mEq/L (ref 135–145)

## 2012-11-21 LAB — GLUCOSE, CAPILLARY
Glucose-Capillary: 112 mg/dL — ABNORMAL HIGH (ref 70–99)
Glucose-Capillary: 121 mg/dL — ABNORMAL HIGH (ref 70–99)
Glucose-Capillary: 125 mg/dL — ABNORMAL HIGH (ref 70–99)

## 2012-11-21 MED ORDER — POTASSIUM CHLORIDE 20 MEQ/15ML (10%) PO LIQD: 40.0000 meq | Freq: Once | ORAL | Status: AC

## 2012-11-21 MED ORDER — POTASSIUM CHLORIDE 20 MEQ/15ML (10%) PO LIQD
ORAL | Status: AC
  Administered 2012-11-21: 40 meq
  Filled 2012-11-21: qty 30

## 2012-11-21 MED ORDER — FENTANYL CITRATE 0.05 MG/ML IJ SOLN
25.0000 ug | INTRAMUSCULAR | Status: AC | PRN
Start: 2012-11-21 — End: 2012-11-22
  Administered 2012-11-21 – 2012-11-22 (×2): 50 ug via INTRAVENOUS
  Filled 2012-11-21 (×2): qty 2

## 2012-11-21 MED ORDER — POTASSIUM CHLORIDE 10 MEQ/50ML IV SOLN
INTRAVENOUS | Status: AC
  Administered 2012-11-21: 10 meq via INTRAVENOUS
  Filled 2012-11-21: qty 100

## 2012-11-21 MED ORDER — POTASSIUM CHLORIDE 10 MEQ/50ML IV SOLN
10.0000 meq | INTRAVENOUS | Status: AC
  Administered 2012-11-21: 10 meq via INTRAVENOUS
  Filled 2012-11-21: qty 50

## 2012-11-21 MED ORDER — PHENYLEPHRINE HCL 10 MG/ML IJ SOLN
30.0000 ug/min | INTRAVENOUS | Status: DC
  Filled 2012-11-21: qty 1

## 2012-11-21 NOTE — Progress Notes (Signed)
NUTRITION FOLLOW UP  Intervention:    1. Resume Jevity 1.2 @ 35 ml/hr after trach  2. Resume 30 ml Prostat TID after trach  3. Continue MVI daily  At goal rate tube feeding regimen will provide 1308 kcal (105% of needs), 91 grams protein (> 100% of needs), and 681 ml H2O.  Nutrition Dx:   Inadequate oral intake related to inability to eat as evidenced by NPO status; ongoing.    Goal:  Pt to meet >/= 90% of their estimated nutrition needs, met.   Monitor:  Vent status, TF tolerance, weight trend, labs   Assessment:   Pt with past medical history of recent CVA with resulting R hemiplegia and aphagia, s/p PEG placement (07/2012) and chronic Foley cath brought to ED with altered mental status and hypoxia.  Patient is currently intubated on ventilator support. Pt with hypokalemia which is being replaced. Pt discussed during ICU rounds and with RN.  MV: 7.5 L/min Temp:Temp (24hrs), Avg:97.9 F (36.6 C), Min:97.3 F (36.3 C), Max:98.2 F (36.8 C)  PEG malfuctioned and replaced 8/7.  Patient has PEG in place. TF held for trach placement.   Height: Ht Readings from Last 1 Encounters:  11/12/12 5\' 3"  (1.6 m)    Weight Status:   Wt Readings from Last 1 Encounters:  11/19/12 167 lb 12.3 oz (76.1 kg)  Admission weight 159 lb (72.5 kg);  1-3+ edema noted  Re-estimated needs:  Kcal: 1240 Protein: >/= 87 grams Fluid: > 1.5 L/day  Skin: stage II sacrum, DTI on right heel, DTI on right shoulder  Diet Order: NPO    Intake/Output Summary (Last 24 hours) at 11/21/12 0940 Last data filed at 11/21/12 0700  Gross per 24 hour  Intake   1115 ml  Output    732 ml  Net    383 ml    Last BM: 8/13   Labs:   Recent Labs Lab 11/16/12 0500  11/18/12 0330 11/19/12 0400 11/20/12 0444 11/21/12 0500  NA 141  < > 138 138 144 144  K 3.1*  < > 3.2* 4.1 3.7 3.1*  CL 109  < > 104 106 109 107  CO2 24  < > 27 25 26 29   BUN 13  < > 14 16 21  28*  CREATININE 0.34*  < > 0.32* 0.35*  0.31* 0.33*  CALCIUM 8.2*  < > 8.1* 8.4 8.6 8.6  MG 1.9  --  2.0  --  2.1  --   PHOS 3.0  --  3.4  --  3.5  --   GLUCOSE 140*  < > 107* 141* 201* 104*  < > = values in this interval not displayed.  CBG (last 3)   Recent Labs  11/20/12 2347 11/21/12 0416 11/21/12 0749  GLUCAP 125* 99 101*   Lab Results  Component Value Date   HGBA1C 6.2* 10/19/2012    Scheduled Meds: . antiseptic oral rinse  1 application Mouth Rinse QID  . chlorhexidine  15 mL Mouth/Throat BID  . cholecalciferol  2,000 Units Oral Daily  . cyanocobalamin  500 mcg Per Tube Daily  . etomidate  40 mg Intravenous Once  . feeding supplement (JEVITY 1.2 CAL)  1,000 mL Per Tube Q24H  . feeding supplement  30 mL Per Tube TID  . fentaNYL  200 mcg Intravenous Once  . heparin  5,000 Units Subcutaneous Q8H  . insulin aspart  0-15 Units Subcutaneous Q4H  . levothyroxine  88 mcg Per Tube QAC breakfast  .  memantine  5 mg Per Tube BID  . midazolam  4 mg Intravenous Once  . multivitamin with minerals  1 tablet Per Tube Daily  . pantoprazole sodium  40 mg Per Tube Daily  . potassium chloride  10 mEq Intravenous Q1 Hr x 2  . propofol  5-70 mcg/kg/min Intravenous Once  . simvastatin  20 mg Oral q1800  . vecuronium  10 mg Intravenous Once    Continuous Infusions:    Kendell Bane RD, LDN, CNSC [phone] Pager [phone] After Hours Pager

## 2012-11-21 NOTE — Procedures (Signed)
Bronchoscopy Procedure Note Jenny Brady [id number] 1925/10/11  Procedure: Bronchoscopy Indications: Diagnostic evaluation of the airways  Procedure Details Consent: Risks of procedure as well as the alternatives and risks of each were explained to the (patient/caregiver).  Consent for procedure obtained. Time Out: Verified patient identification, verified procedure, site/side was marked, verified correct patient position, special equipment/implants available, medications/allergies/relevent history reviewed, required imaging and test results available.  Performed  In preparation for procedure, patient was given 100% FiO2 and bronchoscope lubricated. Sedation: Benzodiazepines and Etomidate  Airway entered and the following bronchi were examined: RUL, RML, RLL, LUL, LLL and Bronchi.   Procedures performed: Brushings performed Bronchoscope removed.    Evaluation Hemodynamic Status: BP stable throughout; O2 sats: stable throughout Patient's Current Condition: stable Specimens:  None Complications: No apparent complications Patient did tolerate procedure well.   Jenny Brady 11/21/2012

## 2012-11-21 NOTE — Procedures (Signed)
See full dictation Consent med poa Size 6 shiley placed Blood loss 5 cc Tolerated well  Mcarthur Rossetti. Tyson Alias, MD, FACP Pgr: [phone] [facility]

## 2012-11-21 NOTE — Procedures (Cosign Needed)
Bedside Tracheostomy Insertion Procedure Note   Patient Details:   Name: Jenny Brady DOB: 02-Sep-1925 MRN: [id number]  Procedure: Tracheostomy  Pre Procedure Assessment: ET Tube Size 6.5: ET Tube secured at lip (cm):20 Bite block in place: Yes Breath Sounds: Rhonch  Post Procedure Assessment: BP 109/72  Pulse 96  Temp(Src) 98.1 F (36.7 C) (Oral)  Resp 18  Ht 5\' 3"  (1.6 m)  Wt 167 lb 12.3 oz (76.1 kg)  BMI 29.73 kg/m2  SpO2 100% O2 sats: stable throughout Complications: No apparent complications Patient did tolerate procedure well Tracheostomy Brand:Shiley Tracheostomy Style:Cuffed Tracheostomy Size: 6.0 Tracheostomy Secured NWG:[id number], velcro Tracheostomy Placement Confirmation:Trach cuff visualized and in place, cxr taken for placement    [name] Ann 11/21/2012, 12:00 PM

## 2012-11-21 NOTE — Progress Notes (Signed)
PULMONARY  / CRITICAL CARE MEDICINE  Name: Jenny Brady MRN: [id number] DOB: 1925-05-20    ADMISSION DATE:  11/11/2012 CONSULTATION DATE:  11/11/2012  REFERRING MD :  EDP PRIMARY SERVICE:  PCCM  CHIEF COMPLAINT:  Acute respiratory failure  BRIEF PATIENT DESCRIPTION: 79 yo with past medical history of recent CVA with resulting R hemiplegia and aphagia, s/p PEG placement and chronic Foley cath brought to ED with altered mental status and hypoxia.  In ED intubate for airway protection / hypoxemia.  Large amount of pooled secretions noted obstructing oropharynx during intubation. R heel hematoma debrided by EDP. Metabolic disarray.  PCCM was consulted.  SIGNIFICANT EVENTS / STUDIES:  Replaced malpositioned PEG 8/7 8/12- extubated, reintubnated in 30 min , dramatic cord edema 8/13- weaning in chair  LINES / TUBES: OETT 8/4 >>>8/12>>>reintubated stridor in 20 min >>> PEG prior admission >> 8/7 New PEG 8/7 >>   CULTURES: 8/4 Blood >>> 8/4 Urine >>> negative  ANTIBIOTICS: Zosyn 8/4 >>>off Vancomycin 8/4 >>> 8/8  INTERVAL HISTORY:  Calmer, weaning  VITAL SIGNS: Temp:  [97.3 F (36.3 C)-98.2 F (36.8 C)] 98.1 F (36.7 C) (08/14 0749) Pulse Rate:  [75-103] 82 (08/14 0744) Resp:  [12-20] 15 (08/14 0744) BP: (93-126)/(47-68) 93/50 mmHg (08/14 0744) SpO2:  [94 %-100 %] 100 % (08/14 0744) FiO2 (%):  [30 %] 30 % (08/14 0744) HEMODYNAMICS:   VENTILATOR SETTINGS: Vent Mode:  [-] PRVC FiO2 (%):  [30 %] 30 % Set Rate:  [14 bmp] 14 bmp Vt Set:  [420 mL] 420 mL PEEP:  [5 cmH20] 5 cmH20 Plateau Pressure:  [12 cmH20-15 cmH20] 14 cmH20  INTAKE / OUTPUT: Intake/Output     08/13 0701 - 08/14 0700 08/14 0701 - 08/15 0700   I.V. (mL/kg) 250 (3.3)    Other 180    NG/GT 955    Total Intake(mL/kg) 1385 (18.2)    Urine (mL/kg/hr) 800 (0.4)    Stool 2 (0)    Total Output 802     Net +583            PHYSICAL EXAMINATION: General:  Mechanically ventilated, synchronous Neuro:  rass  0, not following comands, grimacing intermittent HEENT:  PERRL Cardiovascular:  RRR, no m/r/g Lungs: CTA Abdomen:  Soft, nontender, bowel sounds wnl , PEG site intact, chronic Foley Musculoskeletal:  Trace edema to resolved Skin:  Debrided hematoma R heel  LABS:  Recent Labs Lab 11/16/12 0500 11/17/12 0500 11/18/12 0330 11/19/12 0400 11/20/12 0444 11/21/12 0500  NA 141 140 138 138 144 144  K 3.1* 3.4* 3.2* 4.1 3.7 3.1*  CL 109 106 104 106 109 107  CO2 24 26 27 25 26 29   GLUCOSE 140* 138* 107* 141* 201* 104*  BUN 13 13 14 16 21  28*  CREATININE 0.34* 0.34* 0.32* 0.35* 0.31* 0.33*  CALCIUM 8.2* 8.1* 8.1* 8.4 8.6 8.6  MG 1.9  --  2.0  --  2.1  --   PHOS 3.0  --  3.4  --  3.5  --     Recent Labs Lab 11/16/12 0500 11/17/12 0500 11/19/12 0400  HGB 9.8* 9.2* 8.9*  HCT 30.2* 28.3* 27.3*  WBC 7.4 7.3 7.8  PLT 233 295 268    Recent Labs Lab 11/16/12 0500 11/21/12 0657  AST 16  --   ALT 11  --   ALKPHOS 64  --   BILITOT 0.3  --   PROT 5.8*  --   ALBUMIN 1.5*  --   INR  --  1.10    Recent Labs Lab 11/18/12 1151  PHART 7.491*  PCO2ART 32.8*  PO2ART 159.0*  HCO3 25.0*  TCO2 26  O2SAT 100.0    Recent Labs Lab 11/20/12 1602 11/20/12 1946 11/20/12 2347 11/21/12 0416 11/21/12 0749  GLUCAP 172* 161* 125* 99 101*    CXR:  none today  ASSESSMENT / PLAN:  PULMONARY A:  Acute respiratory failure likely secondary to obstruction of oropharynx with secretions. 8/12 stridor, cord edema P:   -weaning cpap5 ps 5, goal 1 hr this am  -decadron x 4 doses, allow to dc -trach planned per family wishes, explained concern poor quality and likely permanent trach   CARDIOVASCULAR A: Hypotension in setting of dehydration / sedation / positive pressure ventilation. Consider also septic shock although source unclear Hyperlipidemia  P:  -tele -Zocor   RENAL A:  Hypokalemia P:   k supp Hold further lasix  GASTROINTESTINAL A:  Malnutrition.   Dysphagia, S/p  PEG. Malfunctioning PEG, replaced 8/7 P:   Tf to hold, for trach Protonix  HEMATOLOGIC A dvt prevention P:  Heparin for DVT Px, can continue this coags reviewed, wnl  INFECTIOUS A:  No clear infectious source. Pct 0.24 on 8/5 No results found for this basename: PROCALCITON,  in the last 168 hoursP:   Replaced chronic Foley 8/4 Follow temp curve Monitor urine in future with chronic trach  ENDOCRINE  A:  Hyperglycemia.  Hypothyroidism. P:   ICU Glycemic Control Protocol Continue Levothyroxine   NEUROLOGIC A:  H/o CVA, dementia. Acute encephalopathy. Question seizure activity on previous ED visit  Discussed case w Dr Pearlean Brownie 8/6 > she had ? Seizure activity, continued movement of her LE. underwent EEG in his office that showed no seizure focus. He agrees w stopping the keppra and follow P:   Fentanyl PRN, increase after trach Continue Namenda Avoid benzo Chair posiotion, successful daily  Global: weaning, trach planned  I have fully examined this patient and agree with above findings.    And edited in full   Ccm time 30 min   Mcarthur Rossetti. Tyson Alias, MD, FACP Pgr: [phone] [facility]

## 2012-11-21 NOTE — Progress Notes (Signed)
SLP Cancellation Note  Patient Details Name: Jenny Brady MRN: [id number] DOB: [birth year]   Cancelled treatment:       Reason Eval/Treat Not Completed: Patient not medically ready. Pt requires ventilator support and is not yet on trach collar trials. SLP will continue to follow to assess PMSV readiness.   Maxcine Ham 11/21/2012, 1:33 PM  Maxcine Ham, M.A. CCC-SLP

## 2012-11-22 LAB — BASIC METABOLIC PANEL
Chloride: 107 mEq/L (ref 96–112)
GFR calc Af Amer: >90 mL/min (ref >90)
Potassium: 3.6 mEq/L (ref 3.5–5.1)

## 2012-11-22 LAB — GLUCOSE, CAPILLARY
Glucose-Capillary: 125 mg/dL — ABNORMAL HIGH (ref 70–99)
Glucose-Capillary: 140 mg/dL — ABNORMAL HIGH (ref 70–99)
Glucose-Capillary: 142 mg/dL — ABNORMAL HIGH (ref 70–99)
Glucose-Capillary: 150 mg/dL — ABNORMAL HIGH (ref 70–99)

## 2012-11-22 LAB — MAGNESIUM: Magnesium: 2 mg/dL (ref 1.5–2.5)

## 2012-11-22 MED ORDER — ACETAMINOPHEN 160 MG/5ML PO SOLN
650.0000 mg | ORAL | Status: DC | PRN
  Administered 2012-11-22 – 2012-11-30 (×6): 650 mg
  Filled 2012-11-22 (×7): qty 20.3

## 2012-11-22 MED ORDER — JEVITY 1.2 CAL PO LIQD
1000.0000 mL | ORAL | Status: DC
  Administered 2012-11-23 – 2012-12-12 (×19): 1000 mL
  Filled 2012-11-22 (×27): qty 1000

## 2012-11-22 MED ORDER — PRO-STAT SUGAR FREE PO LIQD
30.0000 mL | Freq: Every day | ORAL | Status: DC
  Administered 2012-11-23 – 2012-11-27 (×5): 30 mL
  Filled 2012-11-22 (×5): qty 30

## 2012-11-22 MED ORDER — ALBUTEROL SULFATE (5 MG/ML) 0.5% IN NEBU: 2.5000 mg | INHALATION_SOLUTION | RESPIRATORY_TRACT | Status: DC | PRN

## 2012-11-22 NOTE — Evaluation (Signed)
Passy-Muir Speaking Valve - Evaluation Patient Details  Name: Jenny Brady MRN: [id number] Date of Birth: 12-22-1925  Today's Date: 11/22/2012 Time: 4098-1191 SLP Time Calculation (min): 25 min  Past Medical History:  Past Medical History  Diagnosis Date  . Hyperthyroidism     had RAI-is on thyroid replacement, happened maybe this summer  . Coronary artery disease ?, before 2005    Had a catheterization per son by Dr. [name] in 1991 no reports in  [place].  note from 2005 mentions hx of coronary stent  . GERD (gastroesophageal reflux disease)   . Stroke 07/2012    Right hemiplegia, dysphagia, aphasia.  . Dementia   . Aphasia 07/2012  . Chronic systolic heart failure     07/2012 the EF was 15%  . Mural thrombus of cardiac apex   . Obesity   . IBS (irritable bowel syndrome)   . DVT (deep venous thrombosis) before 2005    occurred after trauma, treated for a while with Coumadin.   Marland Kitchen Dysphagia due to recent stroke 07/2012    ok'd for purree/pudding thick liquids but G tube placed by IR  . OSA (obstructive sleep apnea)     refused CPAP per notes.    Past Surgical History:  Past Surgical History  Procedure Laterality Date  . Tee without cardioversion N/A 07/26/2012    Procedure: TRANSESOPHAGEAL ECHOCARDIOGRAM (TEE);  Surgeon: Laurey Morale, MD;  Location: [facility] ENDOSCOPY;  Service: Cardiovascular;  Laterality: N/A;  . Gastric feeding tube  08/02/2012    placed in radiology   HPI:  79 yo with past medical history of recent CVA with resulting R hemiplegia and aphagia, s/p PEG placement and chronic Foley cath brought to ED with altered mental status and hypoxia.  In ED intubate for airway protection / hypoxemia.  Large amount of pooled secretions noted obstructing oropharynx during intubation. R heel hematoma debrided by EDP. Metabolic disarray. Pt received a Shiley #6 cuffed trach 8/14 and speech was consulted to assess appropriateness for Passy Muir Speaking Valve (PMSV).    Assessment / Plan / Recommendation Clinical Impression  Pt wore the PMSV for 2-3 minute intervals with no attempts at communication and no phonation heard. Vital signs remained stable with no evidence of CO2 retention, however pt appeared uncomfortable and demonstrated increased WOB with visible use of expiratory muscles. Upon removal of PMSV, pt expecorated a small amount of thick secretions from her trach hub. Thick secretions, trach size, and vocal cord edema per MD notes may all be contributing to pt's decreased tolerance of PMSV. Recommend further trials with SLP only at this time.    SLP Assessment  Patient needs continued Speech Lanaguage Pathology Services    Follow Up Recommendations  Skilled Nursing facility;24 hour supervision/assistance    Frequency and Duration min 2x/week  2 weeks   Pertinent Vitals/Pain N/A    SLP Goals Potential to Achieve Goals: Fair Potential Considerations: Ability to learn/carryover information;Previous level of function Progress/Goals/Alternative treatment plan discussed with pt/caregiver and they: No caregivers available;Patient unable to parrticipate in goal setting SLP Goal #1: Pt will tolerate the PMSV for 5 minutes with SLP.   PMSV Trial  PMSV was placed for: </= 3 min Able to redirect subglottic air through upper airway: Yes Able to Attain Phonation: No (no attempts made) Voice Quality: Other (comment) (unable to assess) Able to Expectorate Secretions: Yes Level of Secretion Expectoration with PMSV: Tracheal Breath Support for Phonation: Other (comment) (unable to assess) Intelligibility: Unable to assess (  comment) Respirations During Trial: 22 SpO2 During Trial: 100 % Pulse During Trial: 100 Behavior: Listless;Poor eye contact;Tense;Other (comment) (not communicative)   Tracheostomy Tube  Additional Tracheostomy Tube Assessment Trach Collar Period: since this am Secretion Description: minimal thick, tan secretions expectorated  via trach hub x1 Frequency of Tracheal Suctioning: x1 today Level of Secretion Expectoration: Tracheal    Vent Dependency  Vent Dependent: No FiO2 (%): 28 %    Cuff Deflation Trial Tolerated Cuff Deflation: Yes (deflated at baseline) Length of Time for Cuff Deflation Trial: since noon Behavior: Alert;Poor eye contact;Other (comment) (not communicative)   Maxcine Ham 11/22/2012, 3:59 PM  Maxcine Ham, M.A. CCC-SLP

## 2012-11-22 NOTE — Progress Notes (Signed)
PULMONARY  / CRITICAL CARE MEDICINE  Name: Jenny Brady MRN: [id number] DOB: Sep 18, 1925    ADMISSION DATE:  11/11/2012 CONSULTATION DATE:  11/11/2012  REFERRING MD :  EDP PRIMARY SERVICE:  PCCM  CHIEF COMPLAINT:  Acute respiratory failure  BRIEF PATIENT DESCRIPTION: 79 yo with past medical history of recent CVA with resulting R hemiplegia and aphagia, s/p PEG placement and chronic Foley cath brought to ED with altered mental status and hypoxia.  In ED intubate for airway protection / hypoxemia.  Large amount of pooled secretions noted obstructing oropharynx during intubation. R heel hematoma debrided by EDP. Metabolic disarray.  PCCM was consulted.  SIGNIFICANT EVENTS / STUDIES:  Replaced malpositioned PEG 8/7 8/12- extubated, reintubnated in 30 min , dramatic cord edema 8/13- weaning in chair 8/154- trach  LINES / TUBES: OETT 8/4 >>>8/12>>>reintubated stridor in 20 min >>>8/14 Trach 8/14 (DF) >>> PEG prior admission >> 8/7 New PEG 8/7 >>   CULTURES: 8/4 Blood >>> 8/4 Urine >>> negative  ANTIBIOTICS: Zosyn 8/4 >>>off Vancomycin 8/4 >>> 8/8  INTERVAL HISTORY:  Trach collaring well  VITAL SIGNS: Temp:  [98.1 F (36.7 C)-99.8 F (37.7 C)] 99.8 F (37.7 C) (08/15 0737) Pulse Rate:  [76-108] 93 (08/15 1100) Resp:  [13-22] 18 (08/15 1100) BP: (86-111)/(35-67) 110/44 mmHg (08/15 1100) SpO2:  [97 %-100 %] 100 % (08/15 1100) FiO2 (%):  [28 %-30 %] 28 % (08/15 1100) HEMODYNAMICS:   VENTILATOR SETTINGS: Vent Mode:  [-] Stand-by FiO2 (%):  [28 %-30 %] 28 % Set Rate:  [14 bmp] 14 bmp Vt Set:  [420 mL] 420 mL PEEP:  [5 cmH20] 5 cmH20 Pressure Support:  [10 cmH20] 10 cmH20 Plateau Pressure:  [12 cmH20-14 cmH20] 14 cmH20  INTAKE / OUTPUT: Intake/Output     08/14 0701 - 08/15 0700 08/15 0701 - 08/16 0700   I.V. (mL/kg)  40 (0.5)   Other 600 60   NG/GT 649.3 140   IV Piggyback 100    Total Intake(mL/kg) 1349.3 (17.7) 240 (3.2)   Urine (mL/kg/hr) 1250 (0.7) 205 (0.6)   Stool     Total Output 1250 205   Net +99.3 +35        Stool Occurrence  1 x     PHYSICAL EXAMINATION: General:  No distress Neuro:  rass 0, calm HEENT:  PERRL Cardiovascular:  RRR, no m/r/g Lungs: CTA Abdomen:  Soft, nontender, bowel sounds wnl , PEG site intact, chronic Foley Musculoskeletal:  No edema Skin:  Debrided hematoma R heel  LABS:  Recent Labs Lab 11/16/12 0500  11/18/12 0330 11/19/12 0400 11/20/12 0444 11/21/12 0500 11/22/12 0500  NA 141  < > 138 138 144 144 143  K 3.1*  < > 3.2* 4.1 3.7 3.1* 3.6  CL 109  < > 104 106 109 107 107  CO2 24  < > 27 25 26 29 28   GLUCOSE 140*  < > 107* 141* 201* 104* 141*  BUN 13  < > 14 16 21  28* 25*  CREATININE 0.34*  < > 0.32* 0.35* 0.31* 0.33* 0.36*  CALCIUM 8.2*  < > 8.1* 8.4 8.6 8.6 8.4  MG 1.9  --  2.0  --  2.1  --  2.0  PHOS 3.0  --  3.4  --  3.5  --   --   < > = values in this interval not displayed.  Recent Labs Lab 11/16/12 0500 11/17/12 0500 11/19/12 0400  HGB 9.8* 9.2* 8.9*  HCT 30.2* 28.3* 27.3*  WBC 7.4 7.3 7.8  PLT 233 295 268    Recent Labs Lab 11/16/12 0500 11/21/12 0657  AST 16  --   ALT 11  --   ALKPHOS 64  --   BILITOT 0.3  --   PROT 5.8*  --   ALBUMIN 1.5*  --   INR  --  1.10    Recent Labs Lab 11/18/12 1151  PHART 7.491*  PCO2ART 32.8*  PO2ART 159.0*  HCO3 25.0*  TCO2 26  O2SAT 100.0    Recent Labs Lab 11/21/12 1617 11/21/12 1938 11/21/12 2356 11/22/12 0416 11/22/12 0736  GLUCAP 121* 119* 140* 163* 125*    CXR:  none today  ASSESSMENT / PLAN:  PULMONARY A:  Acute respiratory failure likely secondary to obstruction of oropharynx with secretions. 8/12 stridor, cord edema 8/14 trach P:   -trach collar , goal continuo's -consider drop cuff, no sig secretions -will assess any voice after cuff down  CARDIOVASCULAR A: Hypotension in setting of dehydration / sedation / positive pressure ventilation. Consider also septic shock although source unclear Hyperlipidemia   P:  -tele -Zocor   RENAL A:  Hypokalemia resolved P:   Hold further lasix kvo  GASTROINTESTINAL A:  Malnutrition.   Dysphagia, S/p PEG. Malfunctioning PEG, replaced 8/7 P:   Tf to peg Protonix  HEMATOLOGIC A dvt prevention P:  Heparin for DVT Px Cbc intermittent No bleeding from trach  INFECTIOUS A:  No clear infectious source. Pct 0.24 on 8/5 No results found for this basename: PROCALCITON,  in the last 168 hoursP:   Replaced chronic Foley 8/4 Follow temp curve  ENDOCRINE  A:  Hyperglycemia.  Hypothyroidism. P:   ICU Glycemic Control Protocol Continue Levothyroxine   NEUROLOGIC A:  H/o CVA, dementia. Acute encephalopathy. Question seizure activity on previous ED visit  Discussed case w Dr Pearlean Brownie 8/6 > she had ? Seizure activity, continued movement of her LE. underwent EEG in his office that showed no seizure focus. He agrees w stopping the keppra and follow P:   Fentanyl prn Continue Namenda Avoid benzo Chair posiotion, successful daily Escalate  PT  Global: weaning trach collar very very very well, to sdu, placement, drop cuff  I have fully examined this patient and agree with above findings.    And edited in full   Mcarthur Rossetti. Tyson Alias, MD, FACP Pgr: [phone] [facility]

## 2012-11-22 NOTE — Progress Notes (Signed)
Placed pt on 28% ATC as this time. Cuff remains inflated at this time due to secretions. RT will continue to monitor.

## 2012-11-22 NOTE — Op Note (Signed)
NAMEANNASOFIA, POHL NO.:  [id number]  MEDICAL RECORD NO.:  [id number]  LOCATION:  2M03C                        FACILITY:  [facility]  PHYSICIAN:  Nelda Bucks, MD DATE OF BIRTH:  June 14, 1925  DATE OF PROCEDURE: DATE OF DISCHARGE:                              OPERATIVE REPORT   PROCEDURE:  Percutaneous tracheostomy.  Consent was obtained from the patient's medical power of attorney, who was fully aware of risks and benefits of the procedure including infection, bleeding, pneumothorax, and death.  PREOPERATIVE DIAGNOSIS:  Inability to protect airway and upper airway, stridor, aspiration pneumonia.  POSTOPERATIVE DIAGNOSIS:  Status post tracheostomy secondary to inability to protect airway and upper airway edema, stridor post extubation attempt.  DESCRIPTION OF PROCEDURE:  The patient was placed in supine position. Chlorhexidine preparation was used to sterilize the operative site.  The bronchoscopist was Dr. Felipa Evener.  We placed bronchoscope through the endotracheal tube, backed up to approximately 14.5 cm. After correction preparation and proper medication sedation with Versed, fentanyl, and etomidate, I injected about approximately 6 mL of epinephrine plus lidocaine 1% over the 2nd endotracheal space.  An 1-cm vertical incision was made.  Dissection was made down the tracheal planes.  There was identification in the strap muscles noted.  There is no vascular anomalies noted.  Under direct visualization, the bronchoscope was placed an 18-gauge needle over white catheter sheath into the airway.  Everything was removed except for the white catheter sheath.  There was no posterior wall injury.  Wire was placed through the white catheter sheath and white catheter sheath was removed.  A 14- French punch dilator was placed over the wire into the airway.  A progressive rhino dilator over a glider was placed in and out of the airway to approximately 27  to 30-French the glider and wire remained.  A size 6 Shiley tracheostomy over 26-French dilator was placed over the glider and wire successfully into the airway.  Everything was removed except the tracheostomy.  The tracheostomy was sutured in place with 4-0 monofilament sutures.  The bronchoscopist was then passed a bronchoscope through the new tracheostomy tube into the carina approximately 4 cm below without any postprocedure active bleeding.  The postoperative chest x-ray revealed a well-placed tracheostomy without any pneumothorax or apparent complications.  Blood loss for the procedure was approximately 5 mL.  The patient will follow up in the Percutaneous Tracheostomy Clinic at [phone].     Nelda Bucks, MD     DJF/MEDQ  D:  11/21/2012  T:  11/22/2012  Job:  [id number]

## 2012-11-22 NOTE — Progress Notes (Signed)
Deflated cuff at this time. Mod secretions bloody/tan

## 2012-11-22 NOTE — Progress Notes (Signed)
Unable to start peripheral IV, MD Tyson Alias notified.  Central line access continued per order.

## 2012-11-22 NOTE — Clinical Social Work Note (Signed)
CSW talked to [facility] admissions coordinator, Wille Celeste, about pt returning to [facility].  CSW informed Wille Celeste that pt has trach collar.  Janie asked CSW to fax clinicals to her so administration can make decision about accepting pt back.  CSW faxed updated FL2 and clinicals.

## 2012-11-22 NOTE — Progress Notes (Signed)
NUTRITION FOLLOW UP  Intervention:    1. Increase Jevity 1.2 by 10 ml every 4 hours to new goal rate of 50 ml/hr   2. Decrease Prostat to 30 ml daily  3. Continue MVI daily  At goal rate tube feeding regimen will provide 1540 kcal (>100% of minimum needs), 81 grams protein (> 100% of needs), and 973 ml H2O.  Nutrition Dx:   Inadequate oral intake related to inability to eat as evidenced by NPO status; ongoing.    Goal:  Pt to meet >/= 90% of their estimated nutrition needs, met.   Monitor:  Vent status, TF tolerance, weight trend, labs   Assessment:   Pt with past medical history of recent CVA with resulting R hemiplegia and aphagia, s/p PEG placement (07/2012) and chronic Foley cath brought to ED with altered mental status and hypoxia.  PEG malfuctioned and replaced 8/7.  Pt had trach placed 8/14 and is now on trach collar.   Patient has PEG in place. Jevity 1.2 is infusing @ 35 ml/hr. 30 ml Prostat via tube TID. Tube feeding regimen currently providing 1308 kcal, 91 grams protein, and 681 ml H2O.  Free water flushes: NA  Total free water: 681 ml per day.  Residuals: 0  Height: Ht Readings from Last 1 Encounters:  11/12/12 5\' 3"  (1.6 m)    Weight Status:   Wt Readings from Last 1 Encounters:  11/19/12 167 lb 12.3 oz (76.1 kg)  Admission weight 159 lb (72.5 kg);  1-3+ edema noted  Re-estimated needs:  Kcal: 1450-1600 Protein: 70-80 grams Fluid: > 1.5 L/day  Skin: stage II sacrum, DTI on right heel, DTI on right shoulder  Diet Order: NPO    Intake/Output Summary (Last 24 hours) at 11/22/12 1145 Last data filed at 11/22/12 1100  Gross per 24 hour  Intake 1329.25 ml  Output   1255 ml  Net  74.25 ml    Last BM: 8/15   Labs:   Recent Labs Lab 11/16/12 0500  11/18/12 0330  11/20/12 0444 11/21/12 0500 11/22/12 0500  NA 141  < > 138  < > 144 144 143  K 3.1*  < > 3.2*  < > 3.7 3.1* 3.6  CL 109  < > 104  < > 109 107 107  CO2 24  < > 27  < > 26 29 28    BUN 13  < > 14  < > 21 28* 25*  CREATININE 0.34*  < > 0.32*  < > 0.31* 0.33* 0.36*  CALCIUM 8.2*  < > 8.1*  < > 8.6 8.6 8.4  MG 1.9  --  2.0  --  2.1  --  2.0  PHOS 3.0  --  3.4  --  3.5  --   --   GLUCOSE 140*  < > 107*  < > 201* 104* 141*  < > = values in this interval not displayed.  CBG (last 3)   Recent Labs  11/21/12 2356 11/22/12 0416 11/22/12 0736  GLUCAP 140* 163* 125*   Lab Results  Component Value Date   HGBA1C 6.2* 10/19/2012    Scheduled Meds: . antiseptic oral rinse  1 application Mouth Rinse QID  . chlorhexidine  15 mL Mouth/Throat BID  . cholecalciferol  2,000 Units Oral Daily  . cyanocobalamin  500 mcg Per Tube Daily  . feeding supplement (JEVITY 1.2 CAL)  1,000 mL Per Tube Q24H  . feeding supplement  30 mL Per Tube TID  . heparin  5,000 Units Subcutaneous Q8H  . insulin aspart  0-15 Units Subcutaneous Q4H  . levothyroxine  88 mcg Per Tube QAC breakfast  . memantine  5 mg Per Tube BID  . multivitamin with minerals  1 tablet Per Tube Daily  . pantoprazole sodium  40 mg Per Tube Daily  . propofol  5-70 mcg/kg/min Intravenous Once  . simvastatin  20 mg Oral q1800  . vecuronium  10 mg Intravenous Once    Continuous Infusions:    Kendell Bane RD, LDN, CNSC [phone] Pager [phone] After Hours Pager

## 2012-11-23 LAB — GLUCOSE, CAPILLARY
Glucose-Capillary: 132 mg/dL — ABNORMAL HIGH (ref 70–99)
Glucose-Capillary: 156 mg/dL — ABNORMAL HIGH (ref 70–99)
Glucose-Capillary: 160 mg/dL — ABNORMAL HIGH (ref 70–99)

## 2012-11-23 NOTE — Progress Notes (Addendum)
Dr. Tyson Alias notified of temperature of 102.1   UA, blood cultures ordered . Will continue to monitor.  Corliss Skains RN

## 2012-11-23 NOTE — Progress Notes (Signed)
eLink Physician-Brief Progress Note Patient Name: Jenny Brady DOB: 1925-07-07 MRN: [id number]  Date of Service  11/23/2012   HPI/Events of Note   Fever 102, has aline that we have tried to dc , but no PIV yet and a chronic foley  eICU Interventions  Vanc, ceftaz, UA, BC, dc line as able   Intervention Category Major Interventions: Infection - evaluation and management  FEINSTEIN,DANIEL J. 11/23/2012, 11:37 PM

## 2012-11-23 NOTE — Progress Notes (Signed)
PULMONARY  / CRITICAL CARE MEDICINE  Name: Jenny Brady MRN: [id number] DOB: 1925-11-08    ADMISSION DATE:  11/11/2012 CONSULTATION DATE:  11/11/2012  REFERRING MD :  EDP PRIMARY SERVICE:  PCCM  CHIEF COMPLAINT:  Acute respiratory failure  BRIEF PATIENT DESCRIPTION: 79 yo with past medical history of recent CVA with resulting R hemiplegia and aphagia, s/p PEG placement and chronic Foley cath brought to ED with altered mental status and hypoxia.  In ED intubate for airway protection / hypoxemia.  Large amount of pooled secretions noted obstructing oropharynx during intubation. R heel hematoma debrided by EDP. Metabolic disarray.  PCCM was consulted.  SIGNIFICANT EVENTS / STUDIES:  Replaced malpositioned PEG 8/7 8/12- extubated, reintubnated in 30 min , dramatic cord edema 8/13- weaning in chair 8/15- trach  LINES / TUBES: OETT 8/4 >>>8/12>>>reintubated stridor in 20 min >>>8/14 Trach 8/14 (DF) >>> PEG prior admission >> 8/7 New PEG 8/7 >>   CULTURES: 8/4 Blood >>>NEG  8/4 Urine >>> negative  ANTIBIOTICS: Zosyn 8/4 >>>off Vancomycin 8/4 >>> 8/8  INTERVAL HISTORY:  Tolerated Trach collar x 24hr , sats 100%    VITAL SIGNS: Temp:  [98.9 F (37.2 C)-102 F (38.9 C)] 98.9 F (37.2 C) (08/16 0738) Pulse Rate:  [77-107] 94 (08/16 0400) Resp:  [16-26] 17 (08/16 0400) BP: (90-123)/(38-93) 94/69 mmHg (08/16 0400) SpO2:  [95 %-100 %] 99 % (08/16 0710) FiO2 (%):  [28 %] 28 % (08/16 0710) HEMODYNAMICS:   VENTILATOR SETTINGS: Vent Mode:  [-] Stand-by FiO2 (%):  [28 %] 28 %  INTAKE / OUTPUT: Intake/Output     08/15 0701 - 08/16 0700 08/16 0701 - 08/17 0700   I.V. (mL/kg) 40 (0.5)    Other 340    NG/GT 1105    IV Piggyback     Total Intake(mL/kg) 1485 (19.5)    Urine (mL/kg/hr) 1565 (0.9)    Total Output 1565     Net -80          Stool Occurrence 3 x      PHYSICAL EXAMINATION: General:  No distress Neuro:  Chronic right sided hemiplegia , aphasic, follows simple  commands  HEENT: right sided weakness Neck : Trach , clean/dry  Cardiovascular:  RRR, no m/r/g Lungs: few rhonchi  Abdomen:  Soft, nontender, bowel sounds wnl , PEG site intact, chronic Foley Musculoskeletal:  No edema Skin:  Debrided hematoma R heel w/ dsg, sacral ulcer stg 1-2   LABS:  Recent Labs Lab 11/18/12 0330 11/19/12 0400 11/20/12 0444 11/21/12 0500 11/22/12 0500  NA 138 138 144 144 143  K 3.2* 4.1 3.7 3.1* 3.6  CL 104 106 109 107 107  CO2 27 25 26 29 28   GLUCOSE 107* 141* 201* 104* 141*  BUN 14 16 21  28* 25*  CREATININE 0.32* 0.35* 0.31* 0.33* 0.36*  CALCIUM 8.1* 8.4 8.6 8.6 8.4  MG 2.0  --  2.1  --  2.0  PHOS 3.4  --  3.5  --   --     Recent Labs Lab 11/17/12 0500 11/19/12 0400  HGB 9.2* 8.9*  HCT 28.3* 27.3*  WBC 7.3 7.8  PLT 295 268    Recent Labs Lab 11/21/12 0657  INR 1.10    Recent Labs Lab 11/18/12 1151  PHART 7.491*  PCO2ART 32.8*  PO2ART 159.0*  HCO3 25.0*  TCO2 26  O2SAT 100.0    Recent Labs Lab 11/22/12 1600 11/22/12 2012 11/22/12 2354 11/23/12 0413 11/23/12 0707  GLUCAP 140* 142* 160*  162* 156*    CXR:  none today  ASSESSMENT / PLAN:  PULMONARY A:  Acute respiratory failure likely secondary to obstruction of oropharynx with secretions. 8/12 stridor, cord edema 8/14 trach P:   -trach collar , goal continuo's -consider drop cuff, no sig secretions -will assess any voice after cuff down  CARDIOVASCULAR A: Hypotension in setting of dehydration / sedation / positive pressure ventilation. Consider also septic shock although source unclear>resolved  Hyperlipidemia  P:  -tele -Zocor   RENAL A:  Hypokalemia resolved P:   Hold further lasix kvo  GASTROINTESTINAL A:  Malnutrition.   Dysphagia, S/p PEG. Malfunctioning PEG, replaced 8/7 P:   Tf to peg Protonix  HEMATOLOGIC A dvt prevention P:  Heparin for DVT Px Cbc intermittent No bleeding from trach  INFECTIOUS A:  No clear infectious source. Pct  0.24 on 8/5 No results found for this basename: PROCALCITON,  in the last 168 hoursP:   Replaced chronic Foley 8/4 Follow temp curve  ENDOCRINE  A:  Hyperglycemia.  Hypothyroidism. P:   ICU Glycemic Control Protocol Continue Levothyroxine   NEUROLOGIC A:  H/o CVA, dementia. Acute encephalopathy. Question seizure activity on previous ED visit  Discussed case w Dr Pearlean Brownie 8/6 > she had ? Seizure activity, continued movement of her LE. underwent EEG in his office that showed no seizure focus. He agrees w stopping the keppra and follow P:   Fentanyl prn Continue Namenda Avoid benzo Chair posiotion, successful daily Escalate  PT  Global: weaning trach collar very very very well, to sdu, placement, drop cuff  PARRETT,TAMMY NP  Pgr: [phone] [facility]   I have interviewed and examined the patient and reviewed the database. I have formulated the assessment and plan as reflected in the note above with amendments made by me  Billy Fischer, MD;  PCCM service; Mobile [phone]

## 2012-11-23 NOTE — Evaluation (Signed)
Physical Therapy Evaluation Patient Details Name: Jenny Brady MRN: [id number] DOB: 01/01/1926 Today's Date: 11/23/2012 Time: 4098-1191 PT Time Calculation (min): 11 min  PT Assessment / Plan / Recommendation History of Present Illness  Pt admitted from SNF with AMS and hypoxia. 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 Pt total assist at baseline since CVA and remains at this functional level. No further acute needs identified, recommend ROM by nursing with RUE elevation. Signing off.     PT Assessment  Patent does not need any further PT services    Follow Up Recommendations  No PT follow up;Other (comment) (return to SNF)    Does the patient have the potential to tolerate intense rehabilitation      Barriers to Discharge        Equipment Recommendations  None recommended by PT    Recommendations for Other Services     Frequency      Precautions / Restrictions Precautions Precautions: Fall Precaution Comments: right hemiplegia, right neglect   Pertinent Vitals/Pain VSS  Grimace with neck rotation and left shoulder flexion     Mobility  Bed Mobility Details for Bed Mobility Assistance: total assist with pt unable to participate with verbal, visual, or tactile cues    Exercises     PT Diagnosis:    PT Problem List:   PT Treatment Interventions:       PT Goals(Current goals can be found in the care plan section) Acute Rehab PT Goals PT Goal Formulation: No goals set, d/c therapy  Visit Information  Last PT Received On: 11/23/12 Assistance Needed: +2 History of Present Illness: Pt admitted from SNF with AMS and hypoxia. Pt s/p devastating Lt CVA 07/2012 with inability to progress with PT at that time due to inability to follow commands,  dementia and aphasia       Prior Functioning  Home Living Family/patient expects to be discharged to:: Skilled nursing facility Prior Function Level of Independence: Needs assistance Gait / Transfers Assistance Needed: total assist/dependent for all mobility Communication Communication: Tracheostomy    Cognition  Cognition Arousal/Alertness: Awake/alert Behavior During Therapy: Flat affect Overall Cognitive Status: History of cognitive impairments - at baseline    Extremity/Trunk Assessment Upper Extremity Assessment RUE Deficits / Details: edematous; flaccid - retrograde massage and elevation performed LUE Deficits / Details: able to perform PROM full ROM elbow and wrist, limited extension of PIP all will grip spontaneously, shoulder flexion limited to 90 degrees due to pt grimacing. Will move arm spontaneously to scratch face will not move on command Lower Extremity Assessment RLE Deficits / Details: PROM WFL; no AROM, heel wound noted with dressing and Prevalon boot in place LLE Deficits / Details: PROM ankle to neutral, knee and hipe with AAROM although limited assist and full ROM did not assess internal/external rotation Cervical / Trunk Assessment Cervical / Trunk Exceptions: pt maintains head turned to her Lt; unable to rotate past midline due to stiffness with pt grimacing and groaning   Balance    End of Session PT - End of Session Activity Tolerance: Other (comment) (limited secondary to lack of participation) Patient left: in bed;with call bell/phone within reach Nurse Communication: Mobility status  GP     Delorse Lek 11/23/2012, 8:37 AM Delaney Meigs, PT [phone]

## 2012-11-24 DIAGNOSIS — F0391 Unspecified dementia with behavioral disturbance: Secondary | ICD-10-CM

## 2012-11-24 LAB — URINE MICROSCOPIC-ADD ON

## 2012-11-24 LAB — GLUCOSE, CAPILLARY
Glucose-Capillary: 137 mg/dL — ABNORMAL HIGH (ref 70–99)
Glucose-Capillary: 160 mg/dL — ABNORMAL HIGH (ref 70–99)
Glucose-Capillary: 142 mg/dL — ABNORMAL HIGH (ref 70–99)
Glucose-Capillary: 142 mg/dL — ABNORMAL HIGH (ref 70–99)

## 2012-11-24 LAB — BASIC METABOLIC PANEL
CO2: 24 mEq/L (ref 19–32)
Calcium: 8.7 mg/dL (ref 8.4–10.5)
Creatinine, Ser: 0.29 mg/dL — ABNORMAL LOW (ref 0.50–1.10)
GFR calc Af Amer: >90 mL/min (ref >90)
Glucose, Bld: 187 mg/dL — ABNORMAL HIGH (ref 70–99)
Potassium: 3.8 mEq/L (ref 3.5–5.1)
Sodium: 134 mEq/L — ABNORMAL LOW (ref 135–145)

## 2012-11-24 LAB — URINALYSIS, ROUTINE W REFLEX MICROSCOPIC
Bilirubin Urine: NEGATIVE
Glucose, UA: NEGATIVE mg/dL
Ketones, ur: NEGATIVE mg/dL
Nitrite: NEGATIVE
Protein, ur: NEGATIVE mg/dL
Specific Gravity, Urine: 1.015 (ref 1.005–1.030)
Urobilinogen, UA: 4 mg/dL — ABNORMAL HIGH (ref 0.0–1.0)
pH: 8.5 — ABNORMAL HIGH (ref 5.0–8.0)

## 2012-11-24 LAB — CBC
HCT: 30.6 % — ABNORMAL LOW (ref 36.0–46.0)
MCHC: 32 g/dL (ref 30.0–36.0)
MCV: 95 fL (ref 78.0–100.0)
Platelets: 316 10*3/uL (ref 150–400)
RDW: 16.1 % — ABNORMAL HIGH (ref 11.5–15.5)

## 2012-11-24 MED ORDER — VANCOMYCIN HCL IN DEXTROSE 1-5 GM/200ML-% IV SOLN
1000.0000 mg | INTRAVENOUS | Status: AC
  Administered 2012-11-24: 1000 mg via INTRAVENOUS
  Filled 2012-11-24 (×2): qty 200

## 2012-11-24 MED ORDER — SODIUM CHLORIDE 0.9 % IV BOLUS (SEPSIS)
500.0000 mL | Freq: Once | INTRAVENOUS | Status: AC
  Administered 2012-11-24: 500 mL via INTRAVENOUS

## 2012-11-24 MED ORDER — DEXTROSE 5 % IV SOLN
1.0000 g | Freq: Three times a day (TID) | INTRAVENOUS | Status: DC
  Administered 2012-11-24 – 2012-11-25 (×4): 1 g via INTRAVENOUS
  Filled 2012-11-24 (×9): qty 1

## 2012-11-24 MED ORDER — ENOXAPARIN SODIUM 40 MG/0.4ML ~~LOC~~ SOLN
40.0000 mg | SUBCUTANEOUS | Status: DC
  Administered 2012-11-24 – 2012-12-04 (×11): 40 mg via SUBCUTANEOUS
  Filled 2012-11-24 (×12): qty 0.4

## 2012-11-24 MED ORDER — VANCOMYCIN HCL IN DEXTROSE 750-5 MG/150ML-% IV SOLN
750.0000 mg | Freq: Two times a day (BID) | INTRAVENOUS | Status: DC
  Administered 2012-11-24 – 2012-12-02 (×17): 750 mg via INTRAVENOUS
  Filled 2012-11-24 (×20): qty 150

## 2012-11-24 NOTE — Progress Notes (Signed)
Sputum obtained and sent to lab via trach.

## 2012-11-24 NOTE — Progress Notes (Signed)
ANTIBIOTIC CONSULT NOTE - INITIAL  Pharmacy Consult for Vancomycin and Ceftazidime Indication: Recurrent fever  Allergies  Allergen Reactions  . Hyoscyamine Other (See Comments)    Per MAR  . Ivp Dye [Iodinated Diagnostic Agents] Other (See Comments)    Per MAR  . Septra [Sulfamethoxazole W-Trimethoprim] Other (See Comments)    Per [facility]    Patient Measurements: Height: 5\' 3"  (160 cm) Weight: 167 lb 12.3 oz (76.1 kg) IBW/kg (Calculated) : 52.4 Adjusted Body Weight:   Vital Signs: Temp: 99.5 F (37.5 C) (08/16 2300) Temp src: Oral (08/16 2300) BP: 107/54 mmHg (08/17 0017) Pulse Rate: 114 (08/17 0017) Intake/Output from previous day: 08/16 0701 - 08/17 0700 In: 1020 [NG/GT:650] Out: 375 [Urine:375] Intake/Output from this shift: Total I/O In: 50 [NG/GT:50] Out: 180 [Urine:180]  Labs:  Recent Labs  11/21/12 0500 11/22/12 0500  CREATININE 0.33* 0.36*   Estimated Creatinine Clearance: 51.2 ml/min (by C-G formula based on Cr of 0.36). No results found for this basename: VANCOTROUGH, VANCOPEAK, VANCORANDOM, GENTTROUGH, GENTPEAK, GENTRANDOM, TOBRATROUGH, TOBRAPEAK, TOBRARND, AMIKACINPEAK, AMIKACINTROU, AMIKACIN,  in the last 72 hours   Microbiology: Recent Results (from the past 720 hour(s))  URINE CULTURE     Status: None   Collection Time    10/27/12 10:20 AM      Result Value Range Status   Specimen Description URINE, CATHETERIZED   Final   Special Requests NONE   Final   Culture  Setup Time 10/27/2012 18:59   Final   Colony Count NO GROWTH   Final   Culture NO GROWTH   Final   Report Status 10/29/2012 FINAL   Final  CULTURE, BLOOD (ROUTINE X 2)     Status: None   Collection Time    11/11/12 10:10 PM      Result Value Range Status   Specimen Description BLOOD RIGHT WRIST   Final   Special Requests BOTTLES DRAWN AEROBIC ONLY 5CC   Final   Culture  Setup Time     Final   Value: 11/12/2012 05:56     Performed at [facility]   Culture     Final   Value: NO GROWTH 5 DAYS     Performed at [facility]   Report Status 11/18/2012 FINAL   Final  CULTURE, BLOOD (ROUTINE X 2)     Status: None   Collection Time    11/11/12 10:10 PM      Result Value Range Status   Specimen Description BLOOD HAND LEFT   Final   Special Requests BOTTLES DRAWN AEROBIC ONLY 5CC   Final   Culture  Setup Time     Final   Value: 11/12/2012 05:56     Performed at [facility]   Culture     Final   Value: NO GROWTH 5 DAYS     Performed at [facility]   Report Status 11/18/2012 FINAL   Final  URINE CULTURE     Status: None   Collection Time    11/11/12 10:34 PM      Result Value Range Status   Specimen Description URINE, CATHETERIZED   Final   Special Requests CX ADDED AT 2300 ON [id number]   Final   Culture  Setup Time     Final   Value: 11/12/2012 05:57     Performed at [facility]   Colony Count     Final   Value: NO GROWTH     Performed at [facility]   Culture     Final   Value: NO GROWTH     Performed at [facility]   Report Status 11/13/2012 FINAL   Final  MRSA PCR SCREENING     Status: None   Collection Time    11/12/12  1:43 AM      Result Value Range Status   MRSA by PCR NEGATIVE  NEGATIVE Final   Comment:            The GeneXpert MRSA Assay (FDA     approved for NASAL specimens     only), is one component of a     comprehensive MRSA colonization     surveillance program. It is not     intended to diagnose MRSA     infection nor to guide or     monitor treatment for     MRSA infections.  URINE CULTURE     Status: None   Collection Time    11/12/12  1:43 AM      Result Value Range Status   Specimen Description URINE, CATHETERIZED   Final   Special Requests CX ADDED AT 0204 ON [id number]   Final   Culture  Setup Time     Final   Value: 11/12/2012 02:19     Performed at [facility]   Colony Count     Final   Value: NO GROWTH     Performed at [facility]   Culture      Final   Value: NO GROWTH     Performed at [facility]   Report Status 11/13/2012 FINAL   Final    Medical History: Past Medical History  Diagnosis Date  . Hyperthyroidism     had RAI-is on thyroid replacement, happened maybe this summer  . Coronary artery disease ?, before 2005    Had a catheterization per son by Dr. [name] in 1991 no reports in  [place].  note from 2005 mentions hx of coronary stent  . GERD (gastroesophageal reflux disease)   . Stroke 07/2012    Right hemiplegia, dysphagia, aphasia.  . Dementia   . Aphasia 07/2012  . Chronic systolic heart failure     07/2012 the EF was 15%  . Mural thrombus of cardiac apex   . Obesity   . IBS (irritable bowel syndrome)   . DVT (deep venous thrombosis) before 2005    occurred after trauma, treated for a while with Coumadin.   Marland Kitchen Dysphagia due to recent stroke 07/2012    ok'd for purree/pudding thick liquids but G tube placed by IR  . OSA (obstructive sleep apnea)     refused CPAP per notes.     Medications:  Prescriptions prior to admission  Medication Sig Dispense Refill  . acetaminophen (TYLENOL) 325 MG tablet Place 650 mg into feeding tube every 4 (four) hours as needed for pain or fever.      Marland Kitchen albuterol (PROVENTIL) (2.5 MG/3ML) 0.083% nebulizer solution Take 2.5 mg by nebulization every 4 (four) hours as needed for wheezing.      Marland Kitchen antiseptic oral rinse (BIOTENE) LIQD 15 mLs by Mouth Rinse route 2 (two) times daily.      . cholecalciferol (VITAMIN D) 1000 UNITS tablet Place 2,000 Units into feeding tube daily.      . cyanocobalamin 500 MCG tablet Place 500 mcg into feeding tube daily.      Marland Kitchen docusate (COLACE) 50 MG/5ML liquid Take 100 mg  by mouth 2 (two) times daily as needed (for constipation).      . feeding supplement (PRO-STAT SUGAR FREE 64) LIQD Take 30 mLs by mouth daily.      . furosemide (LASIX) 40 MG tablet Place 40 mg into feeding tube daily.      . heparin 5000 UNIT/ML injection Inject 5,000 Units  into the skin every 8 (eight) hours.      . lansoprazole (PREVACID) 30 MG capsule Take 30 mg by mouth daily. Dissolve contents of 1 capsule in 10ml of water or juice      . levETIRAcetam (KEPPRA) 100 MG/ML solution Take 500 mg by mouth 2 (two) times daily.      Marland Kitchen levothyroxine (SYNTHROID, LEVOTHROID) 88 MCG tablet Place 88 mcg into feeding tube daily before breakfast.      . memantine (NAMENDA) 5 MG tablet Place 5 mg into feeding tube 2 (two) times daily.       . Nutritional Supplements (FEEDING SUPPLEMENT, JEVITY 1.5 CAL,) LIQD Place 1,000 mLs into feeding tube continuous. 65 ml/hr; on at 6am off at 2am    0  . polyethylene glycol (MIRALAX / GLYCOLAX) packet Take 17 g by mouth 2 (two) times daily as needed (for constipation).      . pravastatin (PRAVACHOL) 40 MG tablet Place 40 mg into feeding tube daily.      . Water For Irrigation, Sterile (FREE WATER) SOLN Place 100 mLs into feeding tube every 8 (eight) hours.       Assessment: 86yof to start Vancomycin and Ceftazidime for recurrent fever (Tmax 102.1).  - SCr 0.36, Crcl 51 ml/min - Cultures: no growth --> new cultures have been ordered  Goal of Therapy:  Vancomycin trough level 15-20 mcg/ml  Plan:  1. Vancomycin 1g IV x 1, then 750mg  IV q12h 2. Ceftazidime 1g IV q8h 3. Monitor renal function, clinical course and order VT at [facility] [phone] 11/24/2012,12:24 AM

## 2012-11-24 NOTE — Progress Notes (Signed)
PULMONARY  / CRITICAL CARE MEDICINE  Name: Jenny Brady MRN: [id number] DOB: [birth year]    ADMISSION DATE:  11/11/2012 CONSULTATION DATE:  11/11/2012  REFERRING MD :  EDP PRIMARY SERVICE:  PCCM  CHIEF COMPLAINT:  Acute respiratory failure  BRIEF PATIENT DESCRIPTION: 79 yo with past medical history of recent CVA with resulting R hemiplegia and aphagia, s/p PEG placement and chronic Foley cath brought to ED with altered mental status and hypoxia.  In ED intubate for airway protection / hypoxemia.  Large amount of pooled secretions noted obstructing oropharynx during intubation. R heel hematoma debrided by EDP. Metabolic disarray.  PCCM was consulted.  SIGNIFICANT EVENTS / STUDIES:  Replaced malpositioned PEG 8/7 8/12- extubated, reintubnated in 30 min , dramatic cord edema 8/13- weaning in chair 8/15- trach  LINES / TUBES: ETT 8/4 >>>8/12, 8/12 (stridor) >> 8/14  G tube PTA >> 8/7 Trach 8/14 (DF) >> G tube 8/7 >>   CULTURES: 8/4 Blood >>>NEG  8/4 Urine >>> negative Urine 8/17 >>  Reps 8/17 >>  Blood 8/17 >>    ANTIBIOTICS: Zosyn 8/4 >>>off Vancomycin 8/4 >>> 8/8 Vanc 8/16 >>  Ceftaz 8/16 >>   INTERVAL HISTORY:  No distress, Not F/C, purulent trach secretions   VITAL SIGNS: Temp:  [98.7 F (37.1 C)-102.1 F (38.9 C)] 100.9 F (38.3 C) (08/17 1049) Pulse Rate:  [91-116] 98 (08/17 1410) Resp:  [18-27] 27 (08/17 1410) BP: (88-119)/(43-54) 90/50 mmHg (08/17 1410) SpO2:  [96 %-100 %] 98 % (08/17 1410) FiO2 (%):  [28 %] 28 % (08/17 1410) HEMODYNAMICS:   VENTILATOR SETTINGS: Vent Mode:  [-]  FiO2 (%):  [28 %] 28 %  INTAKE / OUTPUT: Intake/Output     08/16 0701 - 08/17 0700 08/17 0701 - 08/18 0700   I.V. (mL/kg)     Other 370    NG/GT 800 650   Total Intake(mL/kg) 1170 (15.4) 650 (8.5)   Urine (mL/kg/hr) 875 (0.5) 150 (0.3)   Stool  1 (0)   Total Output 875 151   Net +295 +499        Urine Occurrence 5 x      PHYSICAL EXAMINATION: General:  No  distress Neuro:  Chronic right sided hemiplegia , aphasic Neck : Trach site clean  Cardiovascular:  RRR s M Lungs: few rhonchi  Abdomen:  Soft, nontender, bowel sounds wnl , PEG site intact, chronic Foley Musculoskeletal:  No edema Skin:  Debrided hematoma R heel w/ dsg, sacral ulcer stg 1-2   LABS:  Recent Labs Lab 11/18/12 0330 11/19/12 0400 11/20/12 0444 11/21/12 0500 11/22/12 0500 11/24/12 0620  NA 138 138 144 144 143 134*  K 3.2* 4.1 3.7 3.1* 3.6 3.8  CL 104 106 109 107 107 101  CO2 27 25 26 29 28 24   GLUCOSE 107* 141* 201* 104* 141* 187*  BUN 14 16 21  28* 25* 11  CREATININE 0.32* 0.35* 0.31* 0.33* 0.36* 0.29*  CALCIUM 8.1* 8.4 8.6 8.6 8.4 8.7  MG 2.0  --  2.1  --  2.0  --   PHOS 3.4  --  3.5  --   --   --     Recent Labs Lab 11/19/12 0400 11/24/12 0620  HGB 8.9* 9.8*  HCT 27.3* 30.6*  WBC 7.8 6.9  PLT 268 316    Recent Labs Lab 11/21/12 0657  INR 1.10    Recent Labs Lab 11/18/12 1151  PHART 7.491*  PCO2ART 32.8*  PO2ART 159.0*  HCO3 25.0*  TCO2 26  O2SAT 100.0    Recent Labs Lab 11/23/12 1527 11/23/12 1911 11/23/12 2336 11/24/12 0409 11/24/12 0847  GLUCAP 132* 112* 126* 137* 163*    CXR:  NNF  ASSESSMENT / PLAN:  PULMONARY A:  Acute respiratory failure likely secondary to obstruction of oropharynx with secretions. 8/12 stridor, cord edema 8/14 trach P:   Cont ATC as tolerated  CARDIOVASCULAR A: Hypotension, resolved Hyperlipidemia  P:  Cont to monitor Cont simvastatin   RENAL A:  Hypokalemia resolved P:   Monitor BMET intermittently Correct electrolytes as indicated   GASTROINTESTINAL A:  Malnutrition.   Dysphagia, S/p PEG. Malfunctioning PEG, replaced 8/7 P:   Cont TFs Cont SUP  HEMATOLOGIC A: Mild anemia P:  Monitor intermittently Cont DVT px  INFECTIOUS A:  Fever, purulent trachsecretions P:   Micro and abx as above CXR AM 8/17  ENDOCRINE  A:  Hyperglycemia, mild. No benefit to tight glycemic  control Hypothyroidism P:   Monitor off SSI Continue Levothyroxine   NEUROLOGIC A:  H/o CVA, dementia Profound debilitation P:   Cont current Rx   Billy Fischer, MD ; [facility] service Mobile [phone].  After 5:30 PM or weekends, call [phone]

## 2012-11-24 NOTE — Progress Notes (Signed)
Patients blood pressure 90/40 and previous was 88/43. Dr. Marin Shutter  notified and gave order to give a bolus . See order. Patient is resting in bed will call bell in reach. Will continue to monitor

## 2012-11-24 NOTE — Progress Notes (Signed)
Central line removed, occlusive dressing placed  no complications noted per MD order.  Corliss Skains RN

## 2012-11-25 ENCOUNTER — Inpatient Hospital Stay (HOSPITAL_COMMUNITY): Payer: Medicare Other

## 2012-11-25 DIAGNOSIS — J0410 Acute tracheitis without obstruction: Secondary | ICD-10-CM

## 2012-11-25 DIAGNOSIS — B958 Unspecified staphylococcus as the cause of diseases classified elsewhere: Secondary | ICD-10-CM

## 2012-11-25 DIAGNOSIS — J9620 Acute and chronic respiratory failure, unspecified whether with hypoxia or hypercapnia: Secondary | ICD-10-CM

## 2012-11-25 LAB — GLUCOSE, CAPILLARY
Glucose-Capillary: 174 mg/dL — ABNORMAL HIGH (ref 70–99)
Glucose-Capillary: 143 mg/dL — ABNORMAL HIGH (ref 70–99)

## 2012-11-25 LAB — URINE CULTURE

## 2012-11-25 NOTE — Progress Notes (Signed)
PULMONARY  / CRITICAL CARE MEDICINE  Name: Jenny Brady MRN: [id number] DOB: July 05, 1925    ADMISSION DATE:  11/11/2012 CONSULTATION DATE:  11/11/2012  REFERRING MD :  EDP PRIMARY SERVICE:  PCCM  CHIEF COMPLAINT:  Acute respiratory failure  BRIEF PATIENT DESCRIPTION: 79 yo with past medical history of recent CVA with resulting R hemiplegia and aphagia, s/p PEG placement and chronic Foley cath brought to ED with altered mental status and hypoxia.  In ED intubate for airway protection / hypoxemia.  Large amount of pooled secretions noted obstructing oropharynx during intubation. R heel hematoma debrided by EDP. Metabolic disarray.  PCCM was consulted.  SIGNIFICANT EVENTS / STUDIES:  Replaced malpositioned PEG 8/7 8/12- extubated, reintubnated in 30 min , dramatic cord edema 8/13- weaning in chair 8/15- trach  LINES / TUBES: ETT 8/4 >>>8/12, 8/12 (stridor) >> 8/14  G tube PTA >> 8/7 Trach 8/14 (DF) >> G tube 8/7 >>   CULTURES: 8/4 Blood >>>NEG  8/4 Urine >>> negative Urine 8/17 >>  Reps 8/17 >>  MOD STAPH AUREUS Blood 8/17 >>        ANTIBIOTICS: Zosyn 8/4 >>>off Vancomycin 8/4 >>> 8/8 Vanc 8/16 >>  Ceftaz 8/16 >>   INTERVAL HISTORY:  No distress, Not F/C, purulent trach secretions - continue 11/24/12 No real change Occ response - more when family around   VITAL SIGNS: Temp:  [97.8 F (36.6 C)-99.9 F (37.7 C)] 97.8 F (36.6 C) (08/18 1159) Pulse Rate:  [91-99] 96 (08/18 0800) Resp:  [18-27] 19 (08/18 0800) BP: (83-105)/(50-61) 97/51 mmHg (08/18 0800) SpO2:  [95 %-100 %] 96 % (08/18 0800) FiO2 (%):  [28 %] 28 % (08/18 0800) HEMODYNAMICS:   VENTILATOR SETTINGS: Vent Mode:  [-]  FiO2 (%):  [28 %] 28 %  INTAKE / OUTPUT: Intake/Output     08/17 0701 - 08/18 0700 08/18 0701 - 08/19 0700   Other 50    NG/GT 650 900   IV Piggyback 500    Total Intake(mL/kg) 1200 (15.8) 900 (11.8)   Urine (mL/kg/hr) 1015 (0.6) 200 (0.5)   Stool 1 (0)    Total Output 1016  200   Net +184 +700        Urine Occurrence 1 x      PHYSICAL EXAMINATION: General:  No distress Neuro:  Chronic right sided hemiplegia , aphasic Neck : Trach site clean  Cardiovascular:  RRR s M Lungs: few rhonchi  Abdomen:  Soft, nontender, bowel sounds wnl , PEG site intact, chronic Foley Musculoskeletal:  No edema Skin:  Debrided hematoma R heel w/ dsg, sacral ulcer stg 1-2   LABS: PULMONARY No results found for this basename: PHART, PCO2, PCO2ART, PO2, PO2ART, HCO3, TCO2, O2SAT,  in the last 168 hours  CBC  Recent Labs Lab 11/19/12 0400 11/24/12 0620  HGB 8.9* 9.8*  HCT 27.3* 30.6*  WBC 7.8 6.9  PLT 268 316    COAGULATION  Recent Labs Lab 11/21/12 0657  INR 1.10    CARDIAC  No results found for this basename: TROPONINI,  in the last 168 hours No results found for this basename: PROBNP,  in the last 168 hours   CHEMISTRY  Recent Labs Lab 11/19/12 0400 11/20/12 0444 11/21/12 0500 11/22/12 0500 11/24/12 0620  NA 138 144 144 143 134*  K 4.1 3.7 3.1* 3.6 3.8  CL 106 109 107 107 101  CO2 25 26 29 28 24   GLUCOSE 141* 201* 104* 141* 187*  BUN 16 21 28*  25* 11  CREATININE 0.35* 0.31* 0.33* 0.36* 0.29*  CALCIUM 8.4 8.6 8.6 8.4 8.7  MG  --  2.1  --  2.0  --   PHOS  --  3.5  --   --   --    Estimated Creatinine Clearance: 51.2 ml/min (by C-G formula based on Cr of 0.29).   LIVER  Recent Labs Lab 11/21/12 0657  INR 1.10     INFECTIOUS No results found for this basename: LATICACIDVEN, PROCALCITON,  in the last 168 hours   ENDOCRINE CBG (last 3)   Recent Labs  11/25/12 0400 11/25/12 0813 11/25/12 1156  GLUCAP 174* 186* 174*         IMAGING x48h  Dg Chest Port 1 View  11/25/2012   *RADIOLOGY REPORT*  Clinical Data: Respiratory failure.  PORTABLE CHEST - 1 VIEW  Comparison: 11/21/2012.  Findings: Tracheostomy tube tip midline.  Right central line has been removed.  Cardiomegaly.  Poor inspiration.  Pulmonary vascular congestion  most notable centrally.  Minimal improved aeration left lung base.  Atelectatic changes/subtle infiltrate may remain.  Calcified tortuous aorta.  No gross pneumothorax.  IMPRESSION: Cardiomegaly.  Poor inspiration.  Pulmonary vascular congestion most notable centrally.  Minimal improved aeration left lung base.  Atelectatic changes/subtle infiltrate may remain.  Calcified tortuous aorta.   Original Report Authenticated By: Lacy Duverney, M.D.      ASSESSMENT / PLAN:  PULMONARY A:  Acute respiratory failure likely secondary to obstruction of oropharynx with secretions. 8/12 stridor, cord edema 8/14 trach 8!7/14: trach collar continues. Purulent secretions are Staph aureus P:   Cont ATC as tolerated Abx per ID section  CARDIOVASCULAR A: Hypotension, resolved Hyperlipidemia  P:  Cont to monitor Cont simvastatin   RENAL normal P:   Monitor BMET intermittently Correct electrolytes as indicated   GASTROINTESTINAL A:  Malnutrition.   Dysphagia, S/p PEG. Malfunctioning PEG, replaced 8/7 P:   Cont TFs Cont SUP  HEMATOLOGIC A: Mild anemia P:  Monitor intermittently Cont DVT px  INFECTIOUS A:  Fever, purulent trachsecretionsdue to staph P:   DC ceftaz Await sensit and adjust vacn   ENDOCRINE  A:  Hyperglycemia, mild. No benefit to tight glycemic control Hypothyroidism P:   Monitor off SSI Continue Levothyroxine   NEUROLOGIC A:  H/o CVA, dementia Profound debilitation P:   Cont current Rx  GLOBAL 8/17: no family at bedside. Awaiting SNF   Dr. Kalman Shan, M.D., [facility].C.P Pulmonary and Critical Care Medicine Staff Physician [place] [facility] [place] [facility] Pager: [phone], If no answer or between  15:00h - 7:00h: call [phone]  11/25/2012 12:52 PM

## 2012-11-25 NOTE — Progress Notes (Signed)
Utilization review completed.  

## 2012-11-26 LAB — GLUCOSE, CAPILLARY
Glucose-Capillary: 154 mg/dL — ABNORMAL HIGH (ref 70–99)
Glucose-Capillary: 138 mg/dL — ABNORMAL HIGH (ref 70–99)

## 2012-11-26 LAB — CULTURE, RESPIRATORY W GRAM STAIN

## 2012-11-26 LAB — VANCOMYCIN, TROUGH: Vancomycin Tr: 14.6 ug/mL (ref 10.0–20.0)

## 2012-11-26 NOTE — Progress Notes (Signed)
Pt tx per MD order to med-surg, report called to RN on 6E. VSS.  All current work list meds and task completed.

## 2012-11-26 NOTE — Progress Notes (Signed)
PULMONARY  / CRITICAL CARE MEDICINE  Name: Jenny Brady MRN: [id number] DOB: 03/26/1926    ADMISSION DATE:  11/11/2012 CONSULTATION DATE:  11/11/2012  REFERRING MD :  EDP PRIMARY SERVICE:  PCCM  CHIEF COMPLAINT:  Acute respiratory failure  BRIEF PATIENT DESCRIPTION: 79 yo with past medical history of recent CVA with resulting R hemiplegia and aphagia, s/p PEG placement and chronic Foley cath brought to ED with altered mental status and hypoxia.  In ED intubate for airway protection / hypoxemia.  Large amount of pooled secretions noted obstructing oropharynx during intubation. R heel hematoma debrided by EDP. Metabolic disarray.  PCCM was consulted.  SIGNIFICANT EVENTS / STUDIES:  Replaced malpositioned PEG 8/7 8/12- extubated, reintubnated in 30 min , dramatic cord edema 8/13- weaning in chair 8/15- trach  LINES / TUBES: ETT 8/4 >>>8/12, 8/12 (stridor) >> 8/14  G tube PTA >> 8/7 Trach 8/14 (DF) >> G tube 8/7 >>   CULTURES: 8/4 Blood >>>NEG  8/4 Urine >>> negative Urine 8/17 >>  00 K YEAST Reps 8/17 >>  MOD STAPH AUREUS Blood 8/17 >>  NEG as of 8/9/4       ANTIBIOTICS: Zosyn 8/4 >>>off Vancomycin 8/4 >>> 8/8 Vanc 8/16 >>  Ceftaz 8/16 >> 8./8  INTERVAL HISTORY:  No distress, Not F/C, purulent trach secretions - continue 11/26/12 but some improved No real change Occ response - more when family around   VITAL SIGNS: Temp:  [97.8 F (36.6 C)-100.5 F (38.1 C)] 98.4 F (36.9 C) (08/19 0741) Pulse Rate:  [79-114] 87 (08/19 0957) Resp:  [16-22] 21 (08/19 0957) BP: (95-120)/(42-68) 110/50 mmHg (08/19 0741) SpO2:  [95 %-100 %] 100 % (08/19 0957) FiO2 (%):  [28 %] 28 % (08/19 0957) HEMODYNAMICS:   VENTILATOR SETTINGS: Vent Mode:  [-]  FiO2 (%):  [28 %] 28 %  INTAKE / OUTPUT: Intake/Output     08/18 0701 - 08/19 0700 08/19 0701 - 08/20 0700   Other 90    NG/GT 2050 50   IV Piggyback     Total Intake(mL/kg) 2140 (28.1) 50 (0.7)   Urine (mL/kg/hr) 1075 (0.6)  50 (0.2)   Stool     Total Output 1075 50   Net +1065 0        Urine Occurrence 2 x 1 x   Stool Occurrence 6 x      PHYSICAL EXAMINATION: General:  No distress Neuro:  Chronic right sided hemiplegia , aphasic. HAs eye open Neck : Trach site clean  Cardiovascular:  RRR s M Lungs: few rhonchi  Abdomen:  Soft, nontender, bowel sounds wnl , PEG site intact, chronic Foley Musculoskeletal:  No edema Skin:  Debrided hematoma R heel w/ dsg, sacral ulcer stg 1-2   LABS: PULMONARY No results found for this basename: PHART, PCO2, PCO2ART, PO2, PO2ART, HCO3, TCO2, O2SAT,  in the last 168 hours  CBC  Recent Labs Lab 11/24/12 0620  HGB 9.8*  HCT 30.6*  WBC 6.9  PLT 316    COAGULATION  Recent Labs Lab 11/21/12 0657  INR 1.10    CARDIAC  No results found for this basename: TROPONINI,  in the last 168 hours No results found for this basename: PROBNP,  in the last 168 hours   CHEMISTRY  Recent Labs Lab 11/20/12 0444 11/21/12 0500 11/22/12 0500 11/24/12 0620  NA 144 144 143 134*  K 3.7 3.1* 3.6 3.8  CL 109 107 107 101  CO2 26 29 28 24   GLUCOSE 201* 104*  141* 187*  BUN 21 28* 25* 11  CREATININE 0.31* 0.33* 0.36* 0.29*  CALCIUM 8.6 8.6 8.4 8.7  MG 2.1  --  2.0  --   PHOS 3.5  --   --   --    Estimated Creatinine Clearance: 51.2 ml/min (by C-G formula based on Cr of 0.29).   LIVER  Recent Labs Lab 11/21/12 0657  INR 1.10     INFECTIOUS No results found for this basename: LATICACIDVEN, PROCALCITON,  in the last 168 hours   ENDOCRINE CBG (last 3)   Recent Labs  11/25/12 1555 11/26/12 0006 11/26/12 0739  GLUCAP 143* 154* 138*         IMAGING x48h  Dg Chest Port 1 View  11/25/2012   *RADIOLOGY REPORT*  Clinical Data: Respiratory failure.  PORTABLE CHEST - 1 VIEW  Comparison: 11/21/2012.  Findings: Tracheostomy tube tip midline.  Right central line has been removed.  Cardiomegaly.  Poor inspiration.  Pulmonary vascular congestion most notable  centrally.  Minimal improved aeration left lung base.  Atelectatic changes/subtle infiltrate may remain.  Calcified tortuous aorta.  No gross pneumothorax.  IMPRESSION: Cardiomegaly.  Poor inspiration.  Pulmonary vascular congestion most notable centrally.  Minimal improved aeration left lung base.  Atelectatic changes/subtle infiltrate may remain.  Calcified tortuous aorta.   Original Report Authenticated By: Lacy Duverney, M.D.      ASSESSMENT / PLAN:  PULMONARY A:  Acute respiratory failure likely secondary to obstruction of oropharynx with secretions. 8/12 stridor, cord edema 8/14 trach 11/26/12: trach collar continues. Purulent secretions are Staph aureus, sensit pending  P:   Cont ATC as tolerated Abx per ID section  CARDIOVASCULAR A: Hypotension, resolved Hyperlipidemia  P:  Cont to monitor Cont simvastatin   RENAL normal P:   Monitor BMET intermittently Correct electrolytes as indicated   GASTROINTESTINAL A:  Malnutrition.   Dysphagia, S/p PEG. Malfunctioning PEG, replaced 8/7 P:   Cont TFs Cont SUP  HEMATOLOGIC A: Mild anemia P:  Monitor intermittently Cont DVT px  INFECTIOUS A:  Fever, purulent trachsecretionsdue to staph P:   Await sensit and adjust vacn   ENDOCRINE  A:  Hyperglycemia, mild. No benefit to tight glycemic control Hypothyroidism P:   Monitor off SSI Continue Levothyroxine   NEUROLOGIC A:  H/o CVA, dementia Profound debilitation P:   Cont current Rx  GLOBAL 8/19: no family at bedside. Awaiting SNF. Moved to med-surg   Dr. Kalman Shan, M.D., [facility].C.P Pulmonary and Critical Care Medicine Staff Physician [facility] Pager: [phone], If no answer or between  15:00h - 7:00h: call [phone]  11/26/2012 10:04 AM

## 2012-11-26 NOTE — Progress Notes (Signed)
CSW completed FL2. Pt transferred to 6E30. CSW called report to unit CSW who will continue to work on Pt d/c to different facility.   Unit CSW to follow for d/c planning.   Leron Croak, LCSWA [facility].  [phone]

## 2012-11-26 NOTE — Progress Notes (Signed)
Called to assess secretions and appropriateness of current floor bed Secretions are increased.no SOB, no WOB noted, hemodynamics wnl RN needs exceed floor Transfer to sdu  She has mrsa, on vanc [employer]. Tyson Alias, MD, FACP Pgr: [phone] [facility] [facility]

## 2012-11-26 NOTE — Progress Notes (Signed)
ANTIBIOTIC CONSULT NOTE - FOLLOW UP  Pharmacy Consult for vancomycin Indication: MRSA in purulent trach secretions  Labs:  Recent Labs  11/24/12 0620  WBC 6.9  HGB 9.8*  PLT 316  CREATININE 0.29*   Estimated Creatinine Clearance: 51.2 ml/min (by C-G formula based on Cr of 0.29).  Recent Labs  11/26/12 2218  VANCOTROUGH 14.6     Microbiology: Recent Results (from the past 720 hour(s))  CULTURE, BLOOD (ROUTINE X 2)     Status: None   Collection Time    11/11/12 10:10 PM      Result Value Range Status   Specimen Description BLOOD RIGHT WRIST   Final   Special Requests BOTTLES DRAWN AEROBIC ONLY 5CC   Final   Culture  Setup Time     Final   Value: 11/12/2012 05:56     Performed at [facility]   Culture     Final   Value: NO GROWTH 5 DAYS     Performed at [facility]   Report Status 11/18/2012 FINAL   Final  CULTURE, BLOOD (ROUTINE X 2)     Status: None   Collection Time    11/11/12 10:10 PM      Result Value Range Status   Specimen Description BLOOD HAND LEFT   Final   Special Requests BOTTLES DRAWN AEROBIC ONLY 5CC   Final   Culture  Setup Time     Final   Value: 11/12/2012 05:56     Performed at [facility]   Culture     Final   Value: NO GROWTH 5 DAYS     Performed at [facility]   Report Status 11/18/2012 FINAL   Final  URINE CULTURE     Status: None   Collection Time    11/11/12 10:34 PM      Result Value Range Status   Specimen Description URINE, CATHETERIZED   Final   Special Requests CX ADDED AT 2300 ON [id number]   Final   Culture  Setup Time     Final   Value: 11/12/2012 05:57     Performed at [facility]   Colony Count     Final   Value: NO GROWTH     Performed at [facility]   Culture     Final   Value: NO GROWTH     Performed at [facility]   Report Status 11/13/2012 FINAL   Final  MRSA PCR SCREENING     Status: None   Collection Time    11/12/12  1:43 AM      Result Value Range  Status   MRSA by PCR NEGATIVE  NEGATIVE Final   Comment:            The GeneXpert MRSA Assay (FDA     approved for NASAL specimens     only), is one component of a     comprehensive MRSA colonization     surveillance program. It is not     intended to diagnose MRSA     infection nor to guide or     monitor treatment for     MRSA infections.  URINE CULTURE     Status: None   Collection Time    11/12/12  1:43 AM      Result Value Range Status   Specimen Description URINE, CATHETERIZED   Final   Special Requests CX ADDED AT 0204 ON [id number]   Final  Culture  Setup Time     Final   Value: 11/12/2012 02:19     Performed at [facility] Count     Final   Value: NO GROWTH     Performed at [facility]   Culture     Final   Value: NO GROWTH     Performed at [facility]   Report Status 11/13/2012 FINAL   Final  URINE CULTURE     Status: None   Collection Time    11/24/12 12:22 AM      Result Value Range Status   Specimen Description URINE, CATHETERIZED   Final   Special Requests CX ADDED AT 0045 ON [id number]   Final   Culture  Setup Time     Final   Value: 11/24/2012 05:49     Performed at [facility]   Colony Count     Final   Value: >=100,000 COLONIES/ML     Performed at [facility]   Culture     Final   Value: YEAST     Performed at [facility]   Report Status 11/25/2012 FINAL   Final  CULTURE, BLOOD (ROUTINE X 2)     Status: None   Collection Time    11/24/12  1:45 AM      Result Value Range Status   Specimen Description BLOOD RIGHT HAND   Final   Special Requests BOTTLES DRAWN AEROBIC ONLY 1.5CC   Final   Culture  Setup Time     Final   Value: 11/24/2012 12:10     Performed at [facility]   Culture     Final   Value:        BLOOD CULTURE RECEIVED NO GROWTH TO DATE CULTURE WILL BE HELD FOR 5 DAYS BEFORE ISSUING A FINAL NEGATIVE REPORT     Performed at [facility]   Report Status PENDING    Incomplete  CULTURE, RESPIRATORY (NON-EXPECTORATED)     Status: None   Collection Time    11/24/12 12:35 PM      Result Value Range Status   Specimen Description TRACHEAL ASPIRATE   Final   Special Requests Normal   Final   Gram Stain     Final   Value: ABUNDANT WBC PRESENT, PREDOMINANTLY PMN     NO SQUAMOUS EPITHELIAL CELLS SEEN     RARE GRAM POSITIVE COCCI IN PAIRS     Performed at [facility]   Culture     Final   Value: MODERATE METHICILLIN RESISTANT STAPHYLOCOCCUS AUREUS     Note: RIFAMPIN AND GENTAMICIN SHOULD NOT BE USED AS SINGLE DRUGS FOR TREATMENT OF STAPH INFECTIONS. CRITICAL RESULT CALLED TO, READ BACK BY AND VERIFIED WITH: ANITA M. @1230  ON 8.19.14 BY BUONO     Performed at [facility]   Report Status 11/26/2012 FINAL   Final   Organism ID, Bacteria METHICILLIN RESISTANT STAPHYLOCOCCUS AUREUS   Final    Anti-infectives   Start     Dose/Rate Route Frequency Ordered Stop   11/24/12 1000  vancomycin (VANCOCIN) IVPB 750 mg/150 ml premix     750 mg 150 mL/hr over 60 Minutes Intravenous Every 12 hours 11/24/12 0033     11/24/12 0200  cefTAZidime (FORTAZ) 1 g in dextrose 5 % 50 mL IVPB  Status:  Discontinued     1 g 100 mL/hr over 30 Minutes Intravenous Every 8 hours 11/24/12 0033 11/25/12 1250  11/24/12 0045  vancomycin (VANCOCIN) IVPB 1000 mg/200 mL premix     1,000 mg 200 mL/hr over 60 Minutes Intravenous NOW 11/24/12 0030 11/24/12 0403   11/11/12 2200  vancomycin (VANCOCIN) IVPB 1000 mg/200 mL premix     1,000 mg 200 mL/hr over 60 Minutes Intravenous  Once 11/11/12 2146 11/12/12 0002   11/11/12 2200  piperacillin-tazobactam (ZOSYN) IVPB 3.375 g     3.375 g 12.5 mL/hr over 240 Minutes Intravenous  Once 11/11/12 2146 11/11/12 2255      Assessment/Plan:  79yo female with persistent fever and MRSA in trach aspirate therapeutic on initial dosing of vanc (trough 14.6, drawn late w/ last dose given early, calculated true trough closer to 16).  Will  continue current dose and continue to monitor.  Vernard Gambles, PharmD, BCPS  11/26/2012,11:53 PM

## 2012-11-26 NOTE — Progress Notes (Signed)
MD called by charge nurse, Hester Mates re: patient's POA upset that patient moved without her being informed. POA felt patient requires more care than floor capable of giving (ie: patient with copious amount of sputum, fairly recent trach placement,etc) Dr. Tyson Alias up to assess patient. After assessment and speaking with RN, it was felt that patient may benefit from stepdown care.

## 2012-11-26 NOTE — Progress Notes (Signed)
Rapid Response in to evaluate patient. Report called to 2600. All belongings taken with patient. Rapid Response assisted with transport. POA notified of move.

## 2012-11-26 NOTE — Progress Notes (Signed)
Received call from lab. Respiratory culture + MRSA. Placed on Contact isolation. Patient currently on Vanc.

## 2012-11-27 DIAGNOSIS — R1310 Dysphagia, unspecified: Secondary | ICD-10-CM

## 2012-11-27 LAB — GLUCOSE, CAPILLARY
Glucose-Capillary: 139 mg/dL — ABNORMAL HIGH (ref 70–99)
Glucose-Capillary: 140 mg/dL — ABNORMAL HIGH (ref 70–99)
Glucose-Capillary: 169 mg/dL — ABNORMAL HIGH (ref 70–99)
Glucose-Capillary: 175 mg/dL — ABNORMAL HIGH (ref 70–99)

## 2012-11-27 MED ORDER — PRO-STAT SUGAR FREE PO LIQD
30.0000 mL | Freq: Two times a day (BID) | ORAL | Status: DC
  Administered 2012-11-27 – 2012-12-12 (×29): 30 mL
  Filled 2012-11-27 (×31): qty 30

## 2012-11-27 MED ORDER — SODIUM CHLORIDE 0.9 % IJ SOLN: 10.0000 mL | INTRAMUSCULAR | Status: DC | PRN

## 2012-11-27 MED ORDER — INSULIN ASPART 100 UNIT/ML ~~LOC~~ SOLN
0.0000 [IU] | Freq: Three times a day (TID) | SUBCUTANEOUS | Status: DC
  Administered 2012-11-27: 2 [IU] via SUBCUTANEOUS
  Administered 2012-11-28: 1 [IU] via SUBCUTANEOUS
  Administered 2012-11-28 – 2012-11-29 (×4): 2 [IU] via SUBCUTANEOUS
  Administered 2012-11-30 – 2012-12-03 (×9): 1 [IU] via SUBCUTANEOUS

## 2012-11-27 MED ORDER — SODIUM CHLORIDE 0.9 % IJ SOLN
10.0000 mL | Freq: Two times a day (BID) | INTRAMUSCULAR | Status: DC
  Administered 2012-11-27 – 2012-12-03 (×12): 10 mL
  Administered 2012-12-03: 20 mL
  Administered 2012-12-04 (×2): 10 mL
  Administered 2012-12-05 (×2): 20 mL
  Administered 2012-12-05 – 2012-12-11 (×10): 10 mL
  Administered 2012-12-11: 40 mL
  Administered 2012-12-12: 10 mL

## 2012-11-27 NOTE — Progress Notes (Signed)
Speech Language Pathology Discharge Patient Details Name: Jenny Brady MRN: [id number] DOB: September 30, 1925 Today's Date: 11/27/2012 Time:  -     Patient discharged from SLP services secondary to continued copious secretions and inability to tolerate/safely wear Passy-Muir Speaking Valve.  Rapid response was called yesterday and pt. Moved to stepdown unit.  Pt. Did not attempt verbal communication during trial with PMSV.  Review of chart also reveals note regarding edematous vocal cord post extubation.  Pt. Is for SNF placement.  Recommend that PMSV be attempted in next venue of care, or once secretions have begun to decrease.  No family present to discuss d/c of ST and pt. Is unable to participate in education. Goals from Evaluation were not met due to pt.'s medical status.  Maryjo Rochester T CCC-SLP       11/27/2012, 2:42 PM

## 2012-11-27 NOTE — Progress Notes (Signed)
NUTRITION FOLLOW UP  Intervention:    Continue Jevity 1.2 formula at goal rate of 50 ml/hr  Increase Prostat liquid protein to 30 ml BID via tube  Total EN regimen to provide 1640 total kcals, 97 gm protein, 968 ml of free water RD to follow for nutrition care plan  Nutrition Dx:   Inadequate oral intake related to inability to eat as evidenced by NPO status, ongoing  Goal:   EN to meet > 90% of estimated nutrition needs, met  Monitor:   EN regimen & tolerance, respiratory status, weight, labs, I/O's  Assessment:   Pt with past medical history of recent CVA with resulting R hemiplegia and aphagia, s/p PEG placement (07/2012) and chronic Foley cath brought to ED with altered mental status and hypoxia; s/p trach placement 8/14.  Patient transferred from 6E (Renal) 8/19.  On trach collar.  Jevity 1.2 formula currently infusing at 50 ml/hr via PEG tube with Prostat liquid protein 30 ml daily providing 1540 total kcals, 82 gm protein, 968 ml of free water.  Noted patient with MRSA in trach secretions ---> on IV Vancomycin.  Height: Ht Readings from Last 1 Encounters:  11/12/12 5\' 3"  (1.6 m)    Weight Status:   Wt Readings from Last 1 Encounters:  11/27/12 167 lb 8.8 oz (76 kg)    Body mass index is 29.69 kg/(m^2).  Re-estimated needs:  Kcal: 1500-1700 Protein: 90-100 gm Fluid: 1.5-1.7 L  Skin: stage II sacrum, DTI on right heel, DTI on right shoulder  Diet Order: NPO   Intake/Output Summary (Last 24 hours) at 11/27/12 1011 Last data filed at 11/27/12 0500  Gross per 24 hour  Intake   1240 ml  Output    347 ml  Net    893 ml    Labs:   Recent Labs Lab 11/21/12 0500 11/22/12 0500 11/24/12 0620  NA 144 143 134*  K 3.1* 3.6 3.8  CL 107 107 101  CO2 29 28 24   BUN 28* 25* 11  CREATININE 0.33* 0.36* 0.29*  CALCIUM 8.6 8.4 8.7  MG  --  2.0  --   GLUCOSE 104* 141* 187*    CBG (last 3)   Recent Labs  11/26/12 1755 11/27/12 0007 11/27/12 0753  GLUCAP  126* 140* 139*    Scheduled Meds: . antiseptic oral rinse  1 application Mouth Rinse QID  . chlorhexidine  15 mL Mouth/Throat BID  . cholecalciferol  2,000 Units Oral Daily  . cyanocobalamin  500 mcg Per Tube Daily  . enoxaparin (LOVENOX) injection  40 mg Subcutaneous Q24H  . feeding supplement (JEVITY 1.2 CAL)  1,000 mL Per Tube Q24H  . feeding supplement  30 mL Per Tube Daily  . levothyroxine  88 mcg Per Tube QAC breakfast  . memantine  5 mg Per Tube BID  . multivitamin with minerals  1 tablet Per Tube Daily  . pantoprazole sodium  40 mg Per Tube Daily  . simvastatin  20 mg Oral q1800  . vancomycin  750 mg Intravenous Q12H    Continuous Infusions:   Maureen Chatters, RD, LDN Pager #: [phone] After-Hours Pager #: [phone]

## 2012-11-27 NOTE — Progress Notes (Signed)
Peripherally Inserted Central Catheter/Midline Placement  The IV Nurse has discussed with the patient and/or persons authorized to consent for the patient, the purpose of this procedure and the potential benefits and risks involved with this procedure.  The benefits include less needle sticks, lab draws from the catheter and patient may be discharged home with the catheter.  Risks include, but not limited to, infection, bleeding, blood clot (thrombus formation), and puncture of an artery; nerve damage and irregular heat beat.  Alternatives to this procedure were also discussed.  PICC/Midline Placement Documentation  PICC / Midline Single Lumen 09/24/12 PICC Left Basilic (Active)       Jenny Brady 11/27/2012, 2:51 PM

## 2012-11-27 NOTE — Progress Notes (Signed)
PULMONARY  / CRITICAL CARE MEDICINE  Name: Jenny Brady MRN: [id number] DOB: April 03, 1926    ADMISSION DATE:  11/11/2012 CONSULTATION DATE:  11/11/2012  REFERRING MD :  EDP PRIMARY SERVICE:  PCCM  CHIEF COMPLAINT:  Acute respiratory failure  BRIEF PATIENT DESCRIPTION: 79 yo with past medical history of recent CVA with resulting R hemiplegia and aphagia, s/p PEG placement and chronic Foley cath brought to ED with altered mental status and hypoxia.  In ED intubate for airway protection / hypoxemia.  Large amount of pooled secretions noted obstructing oropharynx during intubation. R heel hematoma debrided by EDP. Metabolic disarray.  PCCM was consulted.  SIGNIFICANT EVENTS / STUDIES:  Replaced malpositioned PEG 8/7 8/12- extubated, reintubnated in 30 min , dramatic cord edema 8/13- weaning in chair 8/15- trach  LINES / TUBES: ETT 8/4 >>>8/12, 8/12 (stridor) >> 8/14  G tube PTA >> 8/7 Trach 8/14 (DF) >> G tube 8/7 >>   CULTURES: 8/4 Blood >>>NEG  8/4 Urine >>> negative Urine 8/17 >>  00 K YEAST Reps 8/17 >>  MOD STAPH AUREUS Blood 8/17 >>  NEG as of 8/9/4       ANTIBIOTICS: Zosyn 8/4 >>>off Vancomycin 8/4 >>> 8/8 Vanc 8/16 >>  Ceftaz 8/16 >> 8./8  INTERVAL HISTORY:  No distress, Not F/C, purulent trach secretions - continue 11/26/12 but some improved No real change Occ response - more when family around   VITAL SIGNS: Temp:  [97.2 F (36.2 C)-99.4 F (37.4 C)] 98.3 F (36.8 C) (08/20 0745) Pulse Rate:  [92-115] 100 (08/20 0945) Resp:  [17-25] 17 (08/20 0945) BP: (100-151)/(49-82) 130/69 mmHg (08/20 0945) SpO2:  [96 %-100 %] 96 % (08/20 0945) FiO2 (%):  [28 %] 28 % (08/20 0945) Weight:  [76 kg (167 lb 8.8 oz)] 76 kg (167 lb 8.8 oz) (08/20 0419) HEMODYNAMICS:   VENTILATOR SETTINGS: Vent Mode:  [-]  FiO2 (%):  [28 %] 28 %  INTAKE / OUTPUT: Intake/Output     08/19 0701 - 08/20 0700 08/20 0701 - 08/21 0700   I.V. (mL/kg) 65 (0.9)    Other 150    NG/GT 1075  150   IV Piggyback 150    Total Intake(mL/kg) 1440 (18.9) 150 (2)   Urine (mL/kg/hr) 375 (0.2) 225 (0.7)   Drains 20 (0)    Stool 2 (0)    Total Output 397 225   Net +1043 -75        Urine Occurrence 2 x    Stool Occurrence 2 x      PHYSICAL EXAMINATION: General:  No distress Neuro:  Chronic right sided hemiplegia , aphasic. HAs eye open Neck : Trach site clean  Cardiovascular:  RRR s M Lungs: few rhonchi  Abdomen:  Soft, nontender, bowel sounds wnl , PEG site intact, chronic Foley Musculoskeletal:  No edema Skin:  Debrided hematoma R heel w/ dsg, sacral ulcer stg 1-2   LABS: PULMONARY No results found for this basename: PHART, PCO2, PCO2ART, PO2, PO2ART, HCO3, TCO2, O2SAT,  in the last 168 hours  CBC  Recent Labs Lab 11/24/12 0620  HGB 9.8*  HCT 30.6*  WBC 6.9  PLT 316    COAGULATION  Recent Labs Lab 11/21/12 0657  INR 1.10    CARDIAC  No results found for this basename: TROPONINI,  in the last 168 hours No results found for this basename: PROBNP,  in the last 168 hours   CHEMISTRY  Recent Labs Lab 11/21/12 0500 11/22/12 0500 11/24/12 4098  NA 144 143 134*  K 3.1* 3.6 3.8  CL 107 107 101  CO2 29 28 24   GLUCOSE 104* 141* 187*  BUN 28* 25* 11  CREATININE 0.33* 0.36* 0.29*  CALCIUM 8.6 8.4 8.7  MG  --  2.0  --    Estimated Creatinine Clearance: 51.1 ml/min (by C-G formula based on Cr of 0.29).   LIVER  Recent Labs Lab 11/21/12 0657  INR 1.10     INFECTIOUS MRSA in sputum MRSA, vanc sensitive  ENDOCRINE CBG (last 3)   Recent Labs  11/26/12 1755 11/27/12 0007 11/27/12 0753  GLUCAP 126* 140* 139*    No results found.  ASSESSMENT / PLAN:  PULMONARY A:  Acute respiratory failure likely secondary to obstruction of oropharynx with secretions. 8/12 stridor, cord edema 8/14 trach 11/26/12: trach collar continues. Purulent secretions are Staph aureus, sensitive to vanc but resistant to pen.  P:   - Cont ATC as tolerated. -  Abx per ID section.  CARDIOVASCULAR A: Hypotension, resolved Hyperlipidemia  P:  - Cont to monitor. - Cont simvastatin.  RENAL normal P:   - Monitor BMET in AM. - Correct electrolytes as indicated.   GASTROINTESTINAL A:  Malnutrition.   Dysphagia, S/p PEG. Malfunctioning PEG, replaced 8/7 P:   - Cont TFs. - Cont SUP.  HEMATOLOGIC A: Mild anemia P:  - Monitor intermittently. - Cont DVT px.  INFECTIOUS A:  Fever, purulent trachsecretionsdue to staph P:   - Continue vanc.  ENDOCRINE  A:  Hyperglycemia, mild. No benefit to tight glycemic control Hypothyroidism P:   - Monitor off SSI. - Continue Levothyroxine.  NEUROLOGIC A:  H/o CVA, dementia Profound debilitation P:   - Cont current Rx  Awaiting SNF placement.  Alyson Reedy, M.D. [facility] Pulmonary/Critical Care Medicine. Pager: [phone]. After hours pager: [phone].

## 2012-11-28 ENCOUNTER — Inpatient Hospital Stay (HOSPITAL_COMMUNITY): Payer: Medicare Other

## 2012-11-28 LAB — GLUCOSE, CAPILLARY
Glucose-Capillary: 129 mg/dL — ABNORMAL HIGH (ref 70–99)
Glucose-Capillary: 148 mg/dL — ABNORMAL HIGH (ref 70–99)
Glucose-Capillary: 154 mg/dL — ABNORMAL HIGH (ref 70–99)
Glucose-Capillary: 158 mg/dL — ABNORMAL HIGH (ref 70–99)

## 2012-11-28 LAB — PHOSPHORUS: Phosphorus: 3.4 mg/dL (ref 2.3–4.6)

## 2012-11-28 LAB — BASIC METABOLIC PANEL
CO2: 27 mEq/L (ref 19–32)
GFR calc non Af Amer: >90 mL/min (ref >90)
Glucose, Bld: 138 mg/dL — ABNORMAL HIGH (ref 70–99)
Potassium: 3.8 mEq/L (ref 3.5–5.1)
Sodium: 138 mEq/L (ref 135–145)

## 2012-11-28 LAB — CBC
Hemoglobin: 9.5 g/dL — ABNORMAL LOW (ref 12.0–15.0)
RBC: 3.14 MIL/uL — ABNORMAL LOW (ref 3.87–5.11)

## 2012-11-28 NOTE — Progress Notes (Signed)
PULMONARY  / CRITICAL CARE MEDICINE  Name: Jenny Brady MRN: [id number] DOB: [birth year]    ADMISSION DATE:  11/11/2012 CONSULTATION DATE:  11/11/2012  REFERRING MD :  EDP PRIMARY SERVICE:  PCCM  CHIEF COMPLAINT:  Acute respiratory failure  BRIEF PATIENT DESCRIPTION: 79 yo with past medical history of recent CVA with resulting R hemiplegia and aphagia, s/p PEG placement and chronic Foley cath brought to ED with altered mental status and hypoxia.  In ED intubate for airway protection / hypoxemia.  Large amount of pooled secretions noted obstructing oropharynx during intubation. R heel hematoma debrided by EDP. Metabolic disarray.  PCCM was consulted.  SIGNIFICANT EVENTS / STUDIES:  Replaced malpositioned PEG 8/7 8/12- extubated, reintubnated in 30 min , dramatic cord edema 8/13- weaning in chair 8/15- trach  LINES / TUBES: ETT 8/4 >>>8/12, 8/12 (stridor) >> 8/14  G tube PTA >> 8/7 Trach 8/14 (DF) >> G tube 8/7 >>   CULTURES: 8/4 Blood >>>NEG  8/4 Urine >>> negative Urine 8/17 >>  00 K YEAST Reps 8/17 >>  MOD STAPH AUREUS Blood 8/17 >>  NEG as of 8/9/4   ANTIBIOTICS: Zosyn 8/4 >>>off Vancomycin 8/4 >>> 8/8 Vanc 8/16 >>  Ceftaz 8/16 >> 8./8  INTERVAL HISTORY:  Secretions improved.    VITAL SIGNS: Temp:  [97.6 F (36.4 C)-99.1 F (37.3 C)] 99.1 F (37.3 C) (08/21 1300) Pulse Rate:  [89-103] 102 (08/21 1300) Resp:  [14-22] 20 (08/21 1300) BP: (113-133)/(48-80) 129/64 mmHg (08/21 1300) SpO2:  [94 %-100 %] 94 % (08/21 1300) FiO2 (%):  [28 %] 28 % (08/21 1300) Weight:  [75.3 kg (166 lb 0.1 oz)] 75.3 kg (166 lb 0.1 oz) (08/21 0423) HEMODYNAMICS:   VENTILATOR SETTINGS: Vent Mode:  [-]  FiO2 (%):  [28 %] 28 %  INTAKE / OUTPUT: Intake/Output     08/20 0701 - 08/21 0700 08/21 0701 - 08/22 0700   I.V. (mL/kg)     Other     NG/GT 550    IV Piggyback 150    Total Intake(mL/kg) 700 (9.3)    Urine (mL/kg/hr) 1100 (0.6) 190 (0.4)   Drains     Stool 3 (0)    Total  Output 1103 190   Net -403 -190        Urine Occurrence 1 x    Stool Occurrence 1 x      PHYSICAL EXAMINATION: General:  No distress Neuro:  Chronic right sided hemiplegia , aphasic. HAs eye open Neck : Trach site clean, minimal secretions  Cardiovascular:  RRR s M Lungs: few scattered rhonchi  Abdomen:  Soft, nontender, bowel sounds wnl , PEG site intact, chronic Foley Musculoskeletal:  No edema Skin:  Debrided hematoma R heel w/ dsg, sacral ulcer stg 1-2   LABS: PULMONARY No results found for this basename: PHART, PCO2, PCO2ART, PO2, PO2ART, HCO3, TCO2, O2SAT,  in the last 168 hours  CBC  Recent Labs Lab 11/24/12 0620 11/28/12 0635  HGB 9.8* 9.5*  HCT 30.6* 29.4*  WBC 6.9 7.1  PLT 316 338    CHEMISTRY  Recent Labs Lab 11/22/12 0500 11/24/12 0620 11/28/12 0635  NA 143 134* 138  K 3.6 3.8 3.8  CL 107 101 103  CO2 28 24 27   GLUCOSE 141* 187* 138*  BUN 25* 11 14  CREATININE 0.36* 0.29* 0.27*  CALCIUM 8.4 8.7 8.6  MG 2.0  --  1.9  PHOS  --   --  3.4   Estimated Creatinine Clearance: 50.9  ml/min (by C-G formula based on Cr of 0.27).   INFECTIOUS MRSA in sputum MRSA, vanc sensitive  ENDOCRINE CBG (last 3)   Recent Labs  11/27/12 0753 11/27/12 1227 11/27/12 1718  GLUCAP 139* 169* 175*    Dg Chest Port 1 View  11/28/2012   *RADIOLOGY REPORT*  Clinical Data: Respiratory failure  PORTABLE CHEST - 1 VIEW  Comparison:  November 25, 2012  Findings:  There is patchy consolidation in the left lower lobe. Elsewhere, lungs clear.  Heart is mildly enlarged with pulmonary venous hypertension.  No adenopathy.  No pneumothorax.  Central catheter tip is in the superior vena cava.  Tracheostomy as well seated.  IMPRESSION: Tube and catheter positions as described.  No pneumothorax.  Patchy consolidation left lower lobe.  Lungs otherwise clear.  There is cardiomegaly with pulmonary venous hypertension.  These findings suggest a degree of volume overload.   Original Report  Authenticated By: Bretta Bang, M.D.    ASSESSMENT / PLAN:  PULMONARY A:   Acute respiratory failure likely secondary to obstruction of oropharynx with secretions. 8/12 stridor, cord edema-->8/14 trach Staph tracheobronchitis  P:   - Cont ATC as tolerated. - Abx per ID section.  CARDIOVASCULAR A:  Hypotension, resolved Hyperlipidemia  P:  - Cont to monitor. - Cont simvastatin.  RENAL normal P:   - Monitor BMET in AM. - Correct electrolytes as indicated.   GASTROINTESTINAL A:   Malnutrition.   Dysphagia, S/p PEG. Malfunctioning PEG, replaced 8/7 P:   - Cont TFs. - Cont SUP.  HEMATOLOGIC A: Mild anemia P:  - Monitor intermittently. - Cont DVT px.  INFECTIOUS A:  Fever, staph tracheobronchitis P:   - Continue vanc.  ENDOCRINE  A:  Hyperglycemia, mild. No benefit to tight glycemic control Hypothyroidism P:   - Monitor off SSI. - Continue Levothyroxine.  NEUROLOGIC A:   H/o CVA, dementia Profound debilitation P:   - Cont current Rx  Disposition  Awaiting SNF placement.  Ready to go whenever bed available.   Patient seen and examined, agree with above note.  I dictated the care and orders written for this patient under my direction.  Alyson Reedy, MD [phone]

## 2012-11-28 NOTE — Clinical Social Work Note (Signed)
CSW contacted niece, main caregiver, Jenny Brady and provided bed offers.  Jenny Brady, SNF has offered a bed.  [facility] and [facility] have neither said yes or no.  Jenny Brady will visit/contact these facilities as well.  [facility] has neither said yes or no, but Jenny Brady declined this facility.  Jenny Brady will contact CSW back tomorrow (Friday) with SNF decision.    Jenny Brady, LCSWA [phone]  Clinical Social Work

## 2012-11-28 NOTE — Progress Notes (Signed)
**Note De-Identified  Obfuscation** RT note: Patient 7 days post tracheostomy; sutures removed.  Site red and inflamed patient tolerated well.

## 2012-11-29 DIAGNOSIS — R0609 Other forms of dyspnea: Secondary | ICD-10-CM

## 2012-11-29 DIAGNOSIS — Z930 Tracheostomy status: Secondary | ICD-10-CM

## 2012-11-29 NOTE — Clinical Social Work Note (Signed)
CSW spoke at length to caregiver, Nely Dedmon [phone]) re: possible d/c today. [name] received bed offers from CSW around lunchtime yesterday Magdalene Molly 8/21).  Darianne Muralles reports to CSW that she has not had time to neither visit nor call these facilities as of yet.  CSW explained that pt was ready to d/c (per Molli Knock, MD) yesterday.  CSW has provided safe d/c options to caregiver.  (Pt currently has bed available at [facility].  [facility] and [facility] is also considering.)  Beatrice Ziehm states that she would like for pt to go to [facility].  CSW contacted RNCM re: LTACH option.  RNCM reports that [facility] Medicare has no LTACH benefits.  CSW contacted [name] and informed her of this information.  Rashaun Curl states that she would like to get pt Medicare changed so she could have LTACH coverage.  Timia Casselman wishes to speak to a Artist here at [employer].    CSW reiterated to [name] that pt is medically stable and ready for d/c today (per Molli Knock, MD documentation).  [name] not agreeable.  CSW contacted Maddie in [employer] Counsel[employer] and gave contact information for [name] per [facility]'s request.    CSW consulted with Director of CSW and a message was left for Wellsite geologist, Jacky Kindle.  RNCM was provided an update.  CSW is awaiting direction from Medical Director, Jacky Kindle to proceed.  Vickii Penna, LCSWA [phone]  Clinical Social Work

## 2012-11-29 NOTE — Clinical Social Work Note (Signed)
CSW consulted with [facility] and Medical Director for guidance.  Disposition still pending.    Vickii Penna, LCSWA [phone]  Clinical Social Work

## 2012-11-29 NOTE — Progress Notes (Signed)
PULMONARY  / CRITICAL CARE MEDICINE  Name: Jenny Brady MRN: [id number] DOB: 1925/10/31    ADMISSION DATE:  11/11/2012 CONSULTATION DATE:  11/11/2012  REFERRING MD :  EDP PRIMARY SERVICE:  PCCM  CHIEF COMPLAINT:  Acute respiratory failure  BRIEF PATIENT DESCRIPTION: 79 yo with past medical history of recent CVA with resulting R hemiplegia and aphagia, s/p PEG placement and chronic Foley cath brought to ED with altered mental status and hypoxia.  In ED intubate for airway protection / hypoxemia.  Large amount of pooled secretions noted obstructing oropharynx during intubation. R heel hematoma debrided by EDP. Metabolic disarray.  PCCM was consulted.  SIGNIFICANT EVENTS / STUDIES:  Replaced malpositioned PEG 8/7 8/12- extubated, reintubnated in 30 min , dramatic cord edema 8/13- weaning in chair 8/15- trach  LINES / TUBES: ETT 8/4 >>>8/12, 8/12 (stridor) >> 8/14  G tube PTA >> 8/7 Trach 8/14 (DF) >> G tube 8/7 >>   CULTURES: 8/4 Blood >>>NEG  8/4 Urine >>> negative Urine 8/17 >>  00 K YEAST Reps 8/17 >>  MOD STAPH AUREUS Blood 8/17 >>  NEG as of 8/9/4   ANTIBIOTICS: Zosyn 8/4 >>>off Vancomycin 8/4 >>> 8/8 Vanc 8/16 >>  Ceftaz 8/16 >> 8./8  INTERVAL HISTORY:  Secretions improved. No issues over night    VITAL SIGNS: Temp:  [97.6 F (36.4 C)-99.5 F (37.5 C)] 99.5 F (37.5 C) (08/22 0721) Pulse Rate:  [84-122] 102 (08/22 0900) Resp:  [20-25] 24 (08/22 0900) BP: (111-145)/(64-88) 120/68 mmHg (08/22 0900) SpO2:  [94 %-100 %] 97 % (08/22 0900) FiO2 (%):  [28 %] 28 % (08/22 0732) Weight:  [75 kg (165 lb 5.5 oz)] 75 kg (165 lb 5.5 oz) (08/22 0433) HEMODYNAMICS:   VENTILATOR SETTINGS: Vent Mode:  [-]  FiO2 (%):  [28 %] 28 %  INTAKE / OUTPUT: Intake/Output     08/21 0701 - 08/22 0700 08/22 0701 - 08/23 0700   NG/GT 1150    IV Piggyback 150    Total Intake(mL/kg) 1300 (17.3)    Urine (mL/kg/hr) 190 (0.1) 75 (0.4)   Stool 1 (0)    Total Output 191 75   Net  +1109 -75        Stool Occurrence  1 x     PHYSICAL EXAMINATION: General:  No distress Neuro:  Chronic right sided hemiplegia , aphasic. Has eye open Neck : Trach site clean, minimal secretions  Cardiovascular:  RRR s M Lungs: few scattered rhonchi  Abdomen:  Soft, nontender, bowel sounds wnl , PEG site intact, chronic Foley Musculoskeletal:  No edema Skin:  Debrided hematoma R heel w/ dsg, sacral ulcer stg 1-2   LABS: PULMONARY No results found for this basename: PHART, PCO2, PCO2ART, PO2, PO2ART, HCO3, TCO2, O2SAT,  in the last 168 hours  CBC  Recent Labs Lab 11/24/12 0620 11/28/12 0635  HGB 9.8* 9.5*  HCT 30.6* 29.4*  WBC 6.9 7.1  PLT 316 338    CHEMISTRY  Recent Labs Lab 11/24/12 0620 11/28/12 0635  NA 134* 138  K 3.8 3.8  CL 101 103  CO2 24 27  GLUCOSE 187* 138*  BUN 11 14  CREATININE 0.29* 0.27*  CALCIUM 8.7 8.6  MG  --  1.9  PHOS  --  3.4   Estimated Creatinine Clearance: 49.8 ml/min (by C-G formula based on Cr of 0.27).   INFECTIOUS MRSA in sputum MRSA, vanc sensitive  ENDOCRINE CBG (last 3)   Recent Labs  11/28/12 1623 11/28/12 2053 11/29/12  0722  GLUCAP 154* 158* 176*    Dg Chest Port 1 View  11/28/2012   *RADIOLOGY REPORT*  Clinical Data: Respiratory failure  PORTABLE CHEST - 1 VIEW  Comparison:  November 25, 2012  Findings:  There is patchy consolidation in the left lower lobe. Elsewhere, lungs clear.  Heart is mildly enlarged with pulmonary venous hypertension.  No adenopathy.  No pneumothorax.  Central catheter tip is in the superior vena cava.  Tracheostomy as well seated.  IMPRESSION: Tube and catheter positions as described.  No pneumothorax.  Patchy consolidation left lower lobe.  Lungs otherwise clear.  There is cardiomegaly with pulmonary venous hypertension.  These findings suggest a degree of volume overload.   Original Report Authenticated By: Bretta Bang, M.D.    ASSESSMENT / PLAN:  PULMONARY A:   Acute respiratory  failure likely secondary to obstruction of oropharynx with secretions. 8/12 stridor, cord edema-->8/14 trach Staph tracheobronchitis  P:   - Cont ATC as tolerated. - Abx per ID section.  CARDIOVASCULAR A:  Hypotension, resolved Hyperlipidemia  P:  - Cont to monitor. - Cont simvastatin.  RENAL normal P:   - Monitor BMET in AM. - Correct electrolytes as indicated.   GASTROINTESTINAL A:   Malnutrition.   Dysphagia, S/p PEG. Malfunctioning PEG, replaced 8/7 P:   - Cont TFs. - Cont SUP.  HEMATOLOGIC A: Mild anemia P:  - Monitor intermittently. - Cont DVT px.  INFECTIOUS A:  Fever, staph tracheobronchitis P:   - Continue vanc. (will stop at d8)  ENDOCRINE  A:  Hyperglycemia, mild. No benefit to tight glycemic control Hypothyroidism P:   - Monitor off SSI. - Continue Levothyroxine.  NEUROLOGIC A:   H/o CVA, dementia Profound debilitation P:   - Cont current Rx  Disposition  Awaiting SNF placement.  Ready to go whenever bed available.   Simonne Martinet, NP  Patient seen and examined, agree with above note.  I dictated the care and orders written for this patient under my direction.  Alyson Reedy, MD [phone]

## 2012-11-29 NOTE — Clinical Social Work Note (Signed)
CSW contacted niece, main caregiver, Jenny Brady at [phone] re: bed choice.  Per MD Babcock's documentation, pt is ready for d/c to SNF once bed is available.  Jenny Brady has made a bed offer.  [facility] is coming to do a face-to-face assessment of the pt and needs.  CSW is awaiting a return call from niece.  Vickii Penna, LCSWA [phone]  Clinical Social Work

## 2012-11-29 NOTE — Progress Notes (Signed)
ANTIBIOTIC CONSULT NOTE - FOLLOW UP  Pharmacy Consult for vancomycin Indication: MRSA in purulent trach secretions    Allergies  Allergen Reactions  . Hyoscyamine Other (See Comments)    Per MAR  . Ivp Dye [Iodinated Diagnostic Agents] Other (See Comments)    Per MAR  . Septra [Sulfamethoxazole W-Trimethoprim] Other (See Comments)    Per [facility]    Patient Measurements: Height: 5\' 3"  (160 cm) Weight: 165 lb 5.5 oz (75 kg) IBW/kg (Calculated) : 52.4  Vital Signs: Temp: 99.5 F (37.5 C) (08/22 0721) Temp src: Axillary (08/22 0721) BP: 120/68 mmHg (08/22 0900) Pulse Rate: 102 (08/22 0900) Intake/Output from previous day: 08/21 0701 - 08/22 0700 In: 1350 [NG/GT:1200; IV Piggyback:150] Out: 191 [Urine:190; Stool:1] Intake/Output from this shift: Total I/O In: 250 [NG/GT:100; IV Piggyback:150] Out: 75 [Urine:75]  Labs:  Recent Labs  11/28/12 0635  WBC 7.1  HGB 9.5*  PLT 338  CREATININE 0.27*   Estimated Creatinine Clearance: 49.8 ml/min (by C-G formula based on Cr of 0.27).  Recent Labs  11/26/12 2218  VANCOTROUGH 14.6     Assessment: 79 yo female with MRSA tracheobronchitis on vancomycin. Alst WBC= 7.1 on 8/21, afebrile, last SCr= 0.27 on 8/21. Noted for 8 days treatment per MD.  8/17 Ceftaz>>8/19 8/17 Vanc >> (8/24)   8/19 VT = 14.6 mcg/mL (drawn late with last dose given early, likely around 16 mcg/mL)  8/17 RespCx - MRSA 8/17 Blood x 2>> ngtd 8/17 Urine>> >100k yeast Blood 8/4>>ng  Goal of Therapy:  Vancomycin trough level 15-20 mcg/ml  Plan:  -No vancomycin changes needed -Will follow renal function and patient progress  Harland German, Pharm D 11/29/2012 9:49 AM

## 2012-11-30 LAB — CULTURE, BLOOD (ROUTINE X 2)

## 2012-11-30 LAB — GLUCOSE, CAPILLARY
Glucose-Capillary: 142 mg/dL — ABNORMAL HIGH (ref 70–99)
Glucose-Capillary: 143 mg/dL — ABNORMAL HIGH (ref 70–99)
Glucose-Capillary: 121 mg/dL — ABNORMAL HIGH (ref 70–99)
Glucose-Capillary: 125 mg/dL — ABNORMAL HIGH (ref 70–99)

## 2012-11-30 MED ORDER — POLYVINYL ALCOHOL 1.4 % OP SOLN
1.0000 [drp] | OPHTHALMIC | Status: DC | PRN
  Administered 2012-11-30 – 2012-12-07 (×5): 1 [drp] via OPHTHALMIC
  Filled 2012-11-30: qty 15

## 2012-11-30 NOTE — Progress Notes (Signed)
PULMONARY  / CRITICAL CARE MEDICINE  Name: Jenny Brady MRN: [id number] DOB: 08-17-1925    ADMISSION DATE:  11/11/2012 CONSULTATION DATE:  11/11/2012  REFERRING MD :  EDP PRIMARY SERVICE:  PCCM  CHIEF COMPLAINT:  Acute respiratory failure  BRIEF PATIENT DESCRIPTION: 79 yo with past medical history of recent CVA with resulting R hemiplegia and aphagia, s/p PEG placement and chronic Foley cath brought to ED with altered mental status and hypoxia.  In ED intubated for airway protection / hypoxemia.  Large amount of pooled secretions noted obstructing oropharynx during intubation. R heel hematoma debrided by EDP. Metabolic disarray.  PCCM was consulted.  SIGNIFICANT EVENTS / STUDIES:  Replaced malpositioned PEG 8/7 8/12- extubated, reintubnated in 30 min , severe cord edema 8/13- weaning in chair 8/15- trach  LINES / TUBES: ETT 8/4 >>>8/12, 8/12 (stridor) >> 8/14  G tube PTA >> 8/7 Trach 8/14 (DF) >> G tube 8/7 >>   CULTURES: 8/4 Blood >>>Neg 8/4 Urine >>> negative Urine 8/17 >>  00 K YEAST Resp  8/17 >  MRSA, vanc sens  Blood 8/17 >>      ANTIBIOTICS: Zosyn 8/4 >>>off Vancomycin 8/4 >  8/8 Vanc 8/16 >>  Plan stop 8/26   Ceftaz 8/16 >  8./8  INTERVAL HISTORY:  Maintaing T collar 24/7, no problem with secretions per RT/nursing, no problem over night  VITAL SIGNS: Temp:  [98.2 F (36.8 C)-99.6 F (37.6 C)] 98.2 F (36.8 C) (08/23 0355) Pulse Rate:  [98-120] 120 (08/23 0743) Resp:  [18-39] 22 (08/23 0743) BP: (90-123)/(50-69) 113/59 mmHg (08/23 0743) SpO2:  [87 %-100 %] 100 % (08/23 0743) FiO2 (%):  [28 %-35 %] 28 % (08/23 0743) HEMODYNAMICS:   VENTILATOR SETTINGS: Vent Mode:  [-]  FiO2 (%):  [28 %-35 %] 28 %  INTAKE / OUTPUT: Intake/Output     08/22 0701 - 08/23 0700 08/23 0701 - 08/24 0700   NG/GT 750    IV Piggyback 300    Total Intake(mL/kg) 1050 (14)    Urine (mL/kg/hr) 145 (0.1)    Stool     Total Output 145     Net +905          Stool Occurrence 1  x      PHYSICAL EXAMINATION: General:  No distress Neuro:  Chronic right sided hemiplegia , aphasic. Not resp to verbal/blank stare Neck : Trach site clean, minimal secretions  Cardiovascular:  RRR s M Lungs: few scattered rhonchi  Abdomen:  Soft, nontender, bowel sounds wnl , PEG site intact, chronic Foley Musculoskeletal:  No edema Skin:  Debrided hematoma R heel w/ dsg, sacral ulcer stg 1-2   LABS: PULMONARY No results found for this basename: PHART, PCO2, PCO2ART, PO2, PO2ART, HCO3, TCO2, O2SAT,  in the last 168 hours  CBC  Recent Labs Lab 11/24/12 0620 11/28/12 0635  HGB 9.8* 9.5*  HCT 30.6* 29.4*  WBC 6.9 7.1  PLT 316 338    CHEMISTRY  Recent Labs Lab 11/24/12 0620 11/28/12 0635  NA 134* 138  K 3.8 3.8  CL 101 103  CO2 24 27  GLUCOSE 187* 138*  BUN 11 14  CREATININE 0.29* 0.27*  CALCIUM 8.7 8.6  MG  --  1.9  PHOS  --  3.4   Estimated Creatinine Clearance: 49.8 ml/min (by C-G formula based on Cr of 0.27).   INFECTIOUS MRSA in sputum MRSA, vanc sensitive  ENDOCRINE CBG (last 3)   Recent Labs  11/29/12 0722 11/29/12 1124 11/29/12  1634  GLUCAP 176* 179* 119*    No results found.  ASSESSMENT / PLAN:  PULMONARY A:   Acute respiratory failure likely secondary to obstruction of oropharynx with secretions. 8/12 stridor, cord edema-->8/14 trach MRSA tracheobronchitis  P:   - Cont ATC as tolerated. - Abx per ID section.  CARDIOVASCULAR A:  Hypotension, resolved Hyperlipidemia  P:  - Cont to monitor. - Cont simvastatin.   RENAL  Recent Labs Lab 11/24/12 0620 11/28/12 0635  NA 134* 138  K 3.8 3.8  CL 101 103  CO2 24 27  BUN 11 14  CREATININE 0.29* 0.27*  GLUCOSE 187* 138*  Normal P:    - Correct electrolytes as indicated.   GASTROINTESTINAL A:   Malnutrition.   Dysphagia, S/p PEG. Malfunctioning PEG, replaced 8/7 P:   - Cont TFs. - Cont Acid SUP.  HEMATOLOGIC  Recent Labs Lab 11/24/12 0620 11/28/12 0635   HGB 9.8* 9.5*    A: Mild anemia P:  - Monitor intermittently. - Cont DVT px.  INFECTIOUS A:  Fever,mrsa tracheobronchitis P:   Per micro dashboard (above)  ENDOCRINE  A:  Hyperglycemia, mild. No benefit to tight glycemic control Hypothyroidism Lab Results  Component Value Date   TSH 8.777* 10/20/2012    P:   - Monitor off SSI. - Continue Levothyroxine @ present dose but  Recheck TSH 8/23   NEUROLOGIC A:   H/o CVA, dementia Profound debilitation P:   - Cont current Rx  Disposition  Awaiting SNF placement.  Ready to go whenever bed available.     Sandrea Hughs, MD Pulmonary and Critical Care Medicine [facility] Cell [phone] After 5:30 PM or weekends, call [phone]

## 2012-12-01 DIAGNOSIS — E0590 Thyrotoxicosis, unspecified without thyrotoxic crisis or storm: Secondary | ICD-10-CM

## 2012-12-01 LAB — GLUCOSE, CAPILLARY
Glucose-Capillary: 128 mg/dL — ABNORMAL HIGH (ref 70–99)
Glucose-Capillary: 148 mg/dL — ABNORMAL HIGH (ref 70–99)
Glucose-Capillary: 121 mg/dL — ABNORMAL HIGH (ref 70–99)

## 2012-12-01 NOTE — Consult Note (Signed)
WOC consult Note Reason for Consult:Unstageable pressure Ulcer on right heel. Wound type:Pressure Pressure Ulcer POA: Yes Measurement:6cm x 9cm with stable black eschar included in this measuring 3cm x 6cm toward medial edge. Wound bed:60% stable balck eschar, 20% soft grey eschar dissolving via autolysis beneath a soft siliceon foam dressing and 20% red, friable tissue. Drainage (amount, consistency, odor) Moderate amount of yellow/grey exudate consistent with autolytic debridement from eschar. No odor. Periwound: Intact, faint, limited periwound erythema. Dressing procedure/placement/frequency: I will discontinue the soft silicone foam dressing in favor of twice daily saline dressings to be used in conjunction with the pressure redistribution heel boots.  This willallow [place] greater and more frequent visibility and perhaps slow the debridement process for the stable eschar (which we would prefer to leave intact as a biologic cover dressing). WOC Nursing team will not follow, but will remain available to this patient, her medical and nursing teams. Please reconsult if needed. Thanks, Ladona Mow, MSN, RN, [facility], CWOCN [phone])

## 2012-12-01 NOTE — Progress Notes (Signed)
PULMONARY  / CRITICAL CARE MEDICINE  Name: Jenny Brady MRN: [id number] DOB: 01-08-1926    ADMISSION DATE:  11/11/2012 CONSULTATION DATE:  11/11/2012  REFERRING MD :  EDP PRIMARY SERVICE:  PCCM  CHIEF COMPLAINT:  Acute respiratory failure  BRIEF PATIENT DESCRIPTION: 79 yo with past medical history of recent CVA with resulting R hemiplegia and aphagia, s/p PEG placement and chronic Foley cath brought to ED with altered mental status and hypoxia.  In ED intubated for airway protection / hypoxemia.  Large amount of pooled secretions noted obstructing oropharynx during intubation. R heel hematoma debrided by EDP. Metabolic disarray.  PCCM was consulted.  SIGNIFICANT EVENTS / STUDIES:  Replaced malpositioned PEG 8/7 8/12- extubated, reintubnated in 30 min , severe cord edema 8/13- weaning in chair 8/15- trach  LINES / TUBES: ETT 8/4 >>>8/12, 8/12 (stridor) >> 8/14  G tube PTA >> 8/7 Trach 8/14 (DF) >> G tube 8/7 >>   CULTURES: 8/4 Blood >>>Neg 8/4 Urine >>> negative Urine 8/17 >>  00 K YEAST Resp  8/17 >  MRSA, vanc sens  Blood 8/17 >>  neg     ANTIBIOTICS: Zosyn 8/4 >>>off Vancomycin 8/4 >  8/8 Vanc 8/16 >>  Plan stop 8/26   Ceftaz 8/16 >  8./8  INTERVAL HISTORY:  Maintaing T collar 24/7, no problem with secretions per RT/nursing, no problem over night  VITAL SIGNS: Temp:  [98.2 F (36.8 C)-98.9 F (37.2 C)] 98.8 F (37.1 C) (08/24 0800) Pulse Rate:  [85-113] 108 (08/24 1000) Resp:  [16-26] 17 (08/24 1000) BP: (92-117)/(35-60) 108/54 mmHg (08/24 1000) SpO2:  [97 %-100 %] 99 % (08/24 1000) FiO2 (%):  [28 %] 28 % (08/24 0800) Weight:  [168 lb 10.4 oz (76.5 kg)] 168 lb 10.4 oz (76.5 kg) (08/24 0459) HEMODYNAMICS:   VENTILATOR SETTINGS: Vent Mode:  [-]  FiO2 (%):  [28 %] 28 %  INTAKE / OUTPUT: Intake/Output     08/23 0701 - 08/24 0700 08/24 0701 - 08/25 0700   NG/GT 1050    IV Piggyback 300    Total Intake(mL/kg) 1350 (17.6)    Urine (mL/kg/hr) 2650 (1.4) 250  (0.8)   Total Output 2650 250   Net -1300 -250        Stool Occurrence 1 x      PHYSICAL EXAMINATION: General:  No distress Neuro:  Chronic right sided hemiplegia , aphasic. Not resp to verbal/blank stare Neck : Trach site clean, minimal secretions  Cardiovascular:  RRR s M Lungs: few scattered rhonchi  Abdomen:  Soft, nontender, bowel sounds wnl , PEG site intact, chronic Foley Musculoskeletal:  No edema Skin:  Debrided hematoma R heel w/ dsg, sacral ulcer stg 1-2   LABS: PULMONARY No results found for this basename: PHART, PCO2, PCO2ART, PO2, PO2ART, HCO3, TCO2, O2SAT,  in the last 168 hours  CBC  Recent Labs Lab 11/28/12 0635  HGB 9.5*  HCT 29.4*  WBC 7.1  PLT 338    CHEMISTRY  Recent Labs Lab 11/28/12 0635  NA 138  K 3.8  CL 103  CO2 27  GLUCOSE 138*  BUN 14  CREATININE 0.27*  CALCIUM 8.6  MG 1.9  PHOS 3.4   Estimated Creatinine Clearance: 50.3 ml/min (by C-G formula based on Cr of 0.27).   INFECTIOUS MRSA in sputum MRSA, vanc sensitive  ENDOCRINE CBG (last 3)   Recent Labs  11/30/12 1722 11/30/12 2144 12/01/12 0746  GLUCAP 121* 125* 128*    No results found.  ASSESSMENT / PLAN:  PULMONARY A:   Acute respiratory failure likely secondary to obstruction of oropharynx with secretions. 8/12 stridor, cord edema-->8/14 trach MRSA tracheobronchitis  P:   - Cont ATC as tolerated. - Abx per ID section.  CARDIOVASCULAR A:  Hypotension, resolved Hyperlipidemia  P:  - Cont to monitor. - Cont simvastatin though clearly the time for risk reduction is long gone in this setting    RENAL  Recent Labs Lab 11/28/12 0635  NA 138  K 3.8  CL 103  CO2 27  BUN 14  CREATININE 0.27*  GLUCOSE 138*  Normal P:    - Correct electrolytes as indicated.   GASTROINTESTINAL A:   Malnutrition.   Dysphagia, S/p PEG. Malfunctioning PEG, replaced 8/7 P:   - Cont TFs. - Cont Acid SUP.  HEMATOLOGIC  Recent Labs Lab 11/28/12 0635  HGB  9.5*    A: Mild anemia P:  - Monitor intermittently. - Cont DVT px.  INFECTIOUS A:  Fever,mrsa tracheobronchitis P:   Per micro dashboard (above)  ENDOCRINE  A:  Hyperglycemia, mild. No benefit to tight glycemic control Hypothyroidism Lab Results  Component Value Date   TSH 2.859 11/30/2012    P:   - Monitor off SSI. - Continue Levothyroxine @ present dose   NEUROLOGIC A:   H/o CVA, dementia Profound debilitation P:   - Cont current Rx  Disposition  Awaiting SNF placement.  Ready to go whenever bed available.     Sandrea Hughs, MD Pulmonary and Critical Care Medicine [facility] Cell [phone] After 5:30 PM or weekends, call [phone]

## 2012-12-02 LAB — GLUCOSE, CAPILLARY: Glucose-Capillary: 130 mg/dL — ABNORMAL HIGH (ref 70–99)

## 2012-12-02 MED ORDER — SCOPOLAMINE 1 MG/3DAYS TD PT72
1.0000 | MEDICATED_PATCH | TRANSDERMAL | Status: DC
  Administered 2012-12-02 – 2012-12-11 (×4): 1.5 mg via TRANSDERMAL
  Filled 2012-12-02 (×4): qty 1

## 2012-12-02 NOTE — Progress Notes (Signed)
PULMONARY  / CRITICAL CARE MEDICINE  Name: Jenny Brady MRN: [id number] DOB: 01-Apr-1926    ADMISSION DATE:  11/11/2012 CONSULTATION DATE:  11/11/2012  REFERRING MD :  EDP PRIMARY SERVICE:  PCCM  CHIEF COMPLAINT:  Acute respiratory failure  BRIEF PATIENT DESCRIPTION: 79 y/o with past medical history of recent CVA with resulting R hemiplegia and aphagia, s/p PEG placement and chronic Foley cath brought to ED with altered mental status and hypoxia.  In ED intubated for airway protection / hypoxemia.  Large amount of pooled secretions noted obstructing oropharynx during intubation. R heel hematoma debrided by EDP. Metabolic disarray.  PCCM was consulted for admit.   SIGNIFICANT EVENTS / STUDIES:  8/04 - Admit, acute on chronic resp fx in setting of CVA 8/07 - Replaced malpositioned PEG  8/12 - extubated, reintubnated in 30 min , severe cord edema 8/13 - weaning in chair 8/15 - trach 8/25 - mod secretions, pending bed placement at SNF   LINES / TUBES: ETT 8/4 >>>8/12, 8/12 (stridor) >> 8/14  G tube PTA >> 8/7 Trach 8/14 (DF) >> G tube 8/7 >>   CULTURES: 8/4 Blood >>>Neg 8/4 Urine >>> negative Urine 8/17 >>  00 K YEAST Resp  8/17 >  MRSA, vanc sens  Blood 8/17 >>  neg     ANTIBIOTICS: Zosyn 8/4 >>>off Vancomycin 8/4 >  8/8 Vanc 8/16 >>8/25 Ceftaz 8/16 >  8./8  INTERVAL HISTORY: RN reports pt clearing secretions, pt suctioned for 1st time at 1130 am  VITAL SIGNS: Temp:  [97.9 F (36.6 C)-100.2 F (37.9 C)] 99.4 F (37.4 C) (08/25 0720) Pulse Rate:  [98-122] 107 (08/25 0720) Resp:  [14-22] 19 (08/25 0720) BP: (98-113)/(47-60) 98/49 mmHg (08/25 0720) SpO2:  [93 %-99 %] 96 % (08/25 0720) FiO2 (%):  [28 %] 28 % (08/25 0720)  VENTILATOR SETTINGS: Vent Mode:  [-]  FiO2 (%):  [28 %] 28 %  INTAKE / OUTPUT: Intake/Output     08/24 0701 - 08/25 0700 08/25 0701 - 08/26 0700   Other 80    NG/GT 1100    IV Piggyback 300    Total Intake(mL/kg) 1480 (19.3)    Urine  (mL/kg/hr) 1500 (0.8) 125 (0.4)   Total Output 1500 125   Net -20 -125        Stool Occurrence 3 x      PHYSICAL EXAMINATION: General:  No distress Neuro:  Chronic right sided hemiplegia, aphasic. Not resp to verbal/blank stare Neck : Trach site clean, minimal secretions  Cardiovascular:  RRR s M Lungs: few scattered rhonchi  Abdomen:  Soft, nontender, bowel sounds wnl , PEG site intact, chronic foley Musculoskeletal:  No edema Skin:  Debrided hematoma R heel w/ dsg, sacral ulcer stg 1-2   LABS: PULMONARY  CBC  Recent Labs Lab 11/28/12 0635  HGB 9.5*  HCT 29.4*  WBC 7.1  PLT 338   CHEMISTRY  Recent Labs Lab 11/28/12 0635  NA 138  K 3.8  CL 103  CO2 27  GLUCOSE 138*  BUN 14  CREATININE 0.27*  CALCIUM 8.6  MG 1.9  PHOS 3.4   Estimated Creatinine Clearance: 50.3 ml/min (by C-G formula based on Cr of 0.27).  ENDOCRINE CBG (last 3)   Recent Labs  12/01/12 1655 12/01/12 2146 12/02/12 0827  GLUCAP 121* 126* 139*    No results found.  ASSESSMENT / PLAN:  PULMONARY A:   Acute respiratory failure - likely secondary to obstruction of oropharynx with secretions. 8/12 stridor,  cord edema-->8/14 trach MRSA tracheobronchitis  P:   - Cont ATC as tolerated. - Abx per ID section. - PRN suctioning  - Scopolamine patch initiated 8/25 for secretions, with close observation temp curve and pcx rin am   CARDIOVASCULAR A:  Hypotension - resolved Hyperlipidemia  P:  - Cont to monitor. - Cont simvastatin though clearly the time for risk reduction is long gone in this setting    RENAL A: No acute issues P:   - Correct electrolytes as indicated. - PRN BMP  GASTROINTESTINAL A:   Malnutrition.   Dysphagia - S/p PEG Malfunctioning PEG - replaced 8/7 P:   - Cont TFs - Cont Acid SUP  HEMATOLOGIC   A: Mild anemia P:  - Monitor intermittently - Cont DVT px  INFECTIOUS A:   Fever - low grade, tmax 100.2 MRSA tracheobronchitis P:   -Per micro  dashboard (above) -D/C vanco (Has had 8 days for MRSA tracheobronchitis)  ENDOCRINE  A:  Hyperglycemia, mild. No benefit to tight glycemic control Hypothyroidism   P:   - Monitor off SSI. - Continue Levothyroxine @ present dose   NEUROLOGIC A:   H/o CVA, dementia Profound debilitation P:   - Cont current Rx  DISPO -Awaiting SNF placement.  -Ready to go whenever bed available.   Mcarthur Rossetti. Tyson Alias, MD, FACP Pgr: [phone] [place]

## 2012-12-02 NOTE — Progress Notes (Signed)
ANTIBIOTIC CONSULT NOTE - FOLLOW UP  Pharmacy Consult for vancomycin Indication: MRSA in purulent trach secretions    Allergies  Allergen Reactions  . Hyoscyamine Other (See Comments)    Per MAR  . Ivp Dye [Iodinated Diagnostic Agents] Other (See Comments)    Per MAR  . Septra [Sulfamethoxazole W-Trimethoprim] Other (See Comments)    Per [facility]    Patient Measurements: Height: 5\' 3"  (160 cm) Weight: 168 lb 10.4 oz (76.5 kg) IBW/kg (Calculated) : 52.4  Vital Signs: Temp: 99.4 F (37.4 C) (08/25 0720) Temp src: Axillary (08/25 0720) BP: 98/49 mmHg (08/25 0720) Pulse Rate: 107 (08/25 0720) Intake/Output from previous day: 08/24 0701 - 08/25 0700 In: 1480 [NG/GT:1100; IV Piggyback:300] Out: 1500 [Urine:1500] Intake/Output from this shift: Total I/O In: -  Out: 125 [Urine:125]  Labs: No results found for this basename: WBC, HGB, PLT, LABCREA, CREATININE,  in the last 72 hours Estimated Creatinine Clearance: 50.3 ml/min (by C-G formula based on Cr of 0.27). No results found for this basename: VANCOTROUGH, VANCOPEAK, VANCORANDOM, GENTTROUGH, GENTPEAK, GENTRANDOM, TOBRATROUGH, TOBRAPEAK, TOBRARND, AMIKACINPEAK, AMIKACINTROU, AMIKACIN,  in the last 72 hours   Assessment: 79 yo female with MRSA tracheobronchitis on vancomycin. Last WBC= 7.1 on 8/21, afebrile, Last SCr= 0.27 on 8/21. Noted for 8 days treatment per MD (no actual stop date in place).   8/17 Ceftaz>>8/19 8/17 Vanc >> plan for 8 days per MD note  8/19 VT = 14.6 mcg/mL (drawn late with last dose given early, likely around 16 mcg/mL)  8/17 RespCx - MRSA 8/17 Blood x 2>> NG 8/17 Urine>> >100k yeast 8/14 Blood>> NG  Goal of Therapy:  Vancomycin trough level 15-20 mcg/ml  Plan:  -Continue vancomycin 750 mg IV q12h -F/U with CCM today regarding DC  -Trend clinical status -Drug levels/BMET as indicated if not DC'd soon  Thank you for allowing me to take part in this patient's care,  Abran Duke,  PharmD Clinical Pharmacist Phone: [phone] Pager: [phone] 12/02/2012 10:55 AM

## 2012-12-03 ENCOUNTER — Inpatient Hospital Stay (HOSPITAL_COMMUNITY): Payer: Medicare Other

## 2012-12-03 LAB — CBC
HCT: 25.2 % — ABNORMAL LOW (ref 36.0–46.0)
Hemoglobin: 8.1 g/dL — ABNORMAL LOW (ref 12.0–15.0)
MCH: 30.1 pg (ref 26.0–34.0)
MCHC: 32.1 g/dL (ref 30.0–36.0)
MCV: 93.7 fL (ref 78.0–100.0)
RDW: 16.1 % — ABNORMAL HIGH (ref 11.5–15.5)

## 2012-12-03 LAB — BASIC METABOLIC PANEL
BUN: 17 mg/dL (ref 6–23)
CO2: 28 mEq/L (ref 19–32)
Chloride: 102 mEq/L (ref 96–112)
Creatinine, Ser: 0.33 mg/dL — ABNORMAL LOW (ref 0.50–1.10)
GFR calc Af Amer: >90 mL/min (ref >90)
Potassium: 3.8 mEq/L (ref 3.5–5.1)

## 2012-12-03 LAB — GLUCOSE, CAPILLARY
Glucose-Capillary: 112 mg/dL — ABNORMAL HIGH (ref 70–99)
Glucose-Capillary: 120 mg/dL — ABNORMAL HIGH (ref 70–99)

## 2012-12-03 MED ORDER — FUROSEMIDE 10 MG/ML IJ SOLN
INTRAMUSCULAR | Status: AC
  Filled 2012-12-03: qty 4

## 2012-12-03 MED ORDER — FUROSEMIDE 10 MG/ML IJ SOLN
20.0000 mg | Freq: Once | INTRAMUSCULAR | Status: AC
  Administered 2012-12-03: 20 mg via INTRAVENOUS

## 2012-12-03 MED ORDER — INSULIN ASPART 100 UNIT/ML ~~LOC~~ SOLN
0.0000 [IU] | SUBCUTANEOUS | Status: DC
  Administered 2012-12-04 – 2012-12-07 (×11): 2 [IU] via SUBCUTANEOUS
  Administered 2012-12-07: 8 [IU] via SUBCUTANEOUS
  Administered 2012-12-08 – 2012-12-10 (×9): 2 [IU] via SUBCUTANEOUS

## 2012-12-03 NOTE — Clinical Social Work Note (Signed)
Clinical Social Worker continuing to follow patient and family for support and discharge planning needs.  CSW left a message with patient niece and POA Roxsana Riding Contongiannis) at 12:07 today.  CSW awaits return phone call to update patient discharge status and confirm bed choice.  Patient remains hospitalized due to suctioning concerns from family and MD.  CSW will continue to follow patient and family for support and to facilitate patient discharge needs once medically stable.  Macario Golds, [place] [phone]

## 2012-12-03 NOTE — Progress Notes (Signed)
Trach Team Note  Per notes secretions are improving. PMSV orders d/c'd previously due to secretions. Is pt ready for new orders?  Harlon Ditty, MA CCC-SLP [phone]

## 2012-12-03 NOTE — Progress Notes (Signed)
PULMONARY  / CRITICAL CARE MEDICINE  Name: Jenny Brady MRN: [id number] DOB: 1925-05-31    ADMISSION DATE:  11/11/2012 CONSULTATION DATE:  11/11/2012  REFERRING MD :  EDP PRIMARY SERVICE:  PCCM  CHIEF COMPLAINT:  Acute respiratory failure  BRIEF PATIENT DESCRIPTION: 79 y/o with past medical history of recent CVA with resulting R hemiplegia and aphagia, s/p PEG placement and chronic Foley cath brought to ED with altered mental status and hypoxia.  In ED intubated for airway protection / hypoxemia.  Large amount of pooled secretions noted obstructing oropharynx during intubation. R heel hematoma debrided by EDP. Metabolic disarray.  PCCM was consulted for admit.   SIGNIFICANT EVENTS / STUDIES:  8/04 - Admit, acute on chronic resp fx in setting of CVA 8/07 - Replaced malpositioned PEG  8/12 - extubated, reintubnated in 30 min , severe cord edema 8/13 - weaning in chair 8/15 - trach 8/25 - mod secretions, pending bed placement at SNF  LINES / TUBES: ETT 8/4 >>>8/12, 8/12 (stridor) >> 8/14  G tube PTA >> 8/7 Trach 8/14 (DF) >> G tube 8/7 >>   CULTURES: 8/4 Blood >>>Neg 8/4 Urine >>> negative Urine 8/17 >>  00 K YEAST Resp  8/17 >  MRSA, vanc sens  Blood 8/17 >>  neg    ANTIBIOTICS: Zosyn 8/4 >>>off Vancomycin 8/4 >  8/8 Vanc 8/16 >>8/25 Ceftaz 8/16 >  8./8  INTERVAL HISTORY: some reduction secretions  VITAL SIGNS: Temp:  [98 F (36.7 C)-100.1 F (37.8 C)] 98.6 F (37 C) (08/26 1151) Pulse Rate:  [79-115] 79 (08/26 1151) Resp:  [19-37] 19 (08/26 1151) BP: (89-113)/(41-88) 89/49 mmHg (08/26 1151) SpO2:  [96 %-100 %] 99 % (08/26 1151) FiO2 (%):  [28 %] 28 % (08/26 1154) Weight:  [74.1 kg (163 lb 5.8 oz)] 74.1 kg (163 lb 5.8 oz) (08/26 0407)  VENTILATOR SETTINGS: Vent Mode:  [-]  FiO2 (%):  [28 %] 28 %  INTAKE / OUTPUT: Intake/Output     08/25 0701 - 08/26 0700 08/26 0701 - 08/27 0700   I.V. (mL/kg) 30 (0.4)    Other     NG/GT 1200 200   IV Piggyback 150     Total Intake(mL/kg) 1380 (18.6) 200 (2.7)   Urine (mL/kg/hr) 1975 (1.1) 900 (2.4)   Total Output 1975 900   Net -595 -700        Stool Occurrence 3 x      PHYSICAL EXAMINATION: General:  No distress Neuro:  Chronic right sided hemiplegia, aphasic Neck : Trach site clean, minimal secretions , reduced, clear Cardiovascular:  RRR s M Lungs: coarse Abdomen:  Soft, nontender, bowel sounds wnl , PEG site intact, chronic foley Musculoskeletal:  No edema Skin:  Debrided hematoma R heel w/ dsg, sacral ulcer stg 1-2   LABS: PULMONARY  CBC  Recent Labs Lab 11/28/12 0635 12/03/12 0530  HGB 9.5* 8.1*  HCT 29.4* 25.2*  WBC 7.1 7.6  PLT 338 303   CHEMISTRY  Recent Labs Lab 11/28/12 0635 12/03/12 0530  NA 138 137  K 3.8 3.8  CL 103 102  CO2 27 28  GLUCOSE 138* 145*  BUN 14 17  CREATININE 0.27* 0.33*  CALCIUM 8.6 8.2*  MG 1.9  --   PHOS 3.4  --    Estimated Creatinine Clearance: 49.6 ml/min (by C-G formula based on Cr of 0.33).  ENDOCRINE CBG (last 3)   Recent Labs  12/02/12 1609 12/02/12 2012 12/03/12 0745  GLUCAP 112* 130* 135*  Dg Chest Port 1 View  12/03/2012   *RADIOLOGY REPORT*  Clinical Data: Short of breath, evaluate for airspace disease  PORTABLE CHEST - 1 VIEW  Comparison: Prior chest x-ray 11/28/2012  Findings: Stable position of tracheostomy tube.  The tip is midline and at the level of the clavicles.  Left upper extremity PICC in similar position.  The catheter tip projects over the mid superior vena cava.  Stable cardiomegaly.  Pulmonary vascular congestion is slightly increased compared to prior.  There is likely mild interstitial edema.  Dense left retrocardiac opacity similar to prior imaging and may reflect atelectasis or infiltrate.  No acute osseous abnormality.  IMPRESSION:  Increasing pulmonary vascular congestion now with mild interstitial edema.  Stable cardiomegaly.  Similar left retrocardiac opacity which may reflect atelectasis and / or  infiltrate.  Stable and satisfactory support apparatus.   Original Report Authenticated By: Malachy Moan, M.D.    ASSESSMENT / PLAN:  PULMONARY A:   Acute respiratory failure - likely secondary to obstruction of oropharynx with secretions. 8/12 stridor, cord edema-->8/14 trach MRSA tracheobronchitis  pulm edema component? P:   - Cont ATC as tolerated. - Abx per ID section. - PRN suctioning  - Scopolamine patch initiated 8/25 for secretions, with close observation temp curve -lasix x 1  CARDIOVASCULAR A:  Hypotension - resolved Hyperlipidemia  P:  - Cont to monitor. - Cont simvastatin though clearly the time for risk reduction is long gone in this setting   RENAL A:mIld edema onpcxr P:   - bmet in am with addition lasix  GASTROINTESTINAL A:   Malnutrition.   Dysphagia - S/p PEG Malfunctioning PEG - replaced 8/7 P:   - Cont TFs - Cont Acid SUP  HEMATOLOGIC   A: Mild anemia P:  - Monitor intermittently - Cont DVT px -lasix, cbc in future  INFECTIOUS A:   Fever - low grade, tmax 100.2 MRSA tracheobronchitis P:   -Per micro dashboard (above)  ENDOCRINE  A:  Hyperglycemia, mild. No benefit to tight glycemic control Hypothyroidism   P:   - Monitor off SSI. - Continue Levothyroxine @ present dose   NEUROLOGIC A:   H/o CVA, dementia Profound debilitation P:   - Cont current Rx  DISPO -Awaiting SNF placement.  -lasix  Mcarthur Rossetti. Tyson Alias, MD, FACP Pgr: [phone] [place] [facility]

## 2012-12-04 LAB — BASIC METABOLIC PANEL
CO2: 30 mEq/L (ref 19–32)
Calcium: 8.4 mg/dL (ref 8.4–10.5)
Glucose, Bld: 154 mg/dL — ABNORMAL HIGH (ref 70–99)
Potassium: 3.3 mEq/L — ABNORMAL LOW (ref 3.5–5.1)
Sodium: 139 mEq/L (ref 135–145)

## 2012-12-04 LAB — GLUCOSE, CAPILLARY
Glucose-Capillary: 124 mg/dL — ABNORMAL HIGH (ref 70–99)
Glucose-Capillary: 135 mg/dL — ABNORMAL HIGH (ref 70–99)

## 2012-12-04 LAB — PHOSPHORUS: Phosphorus: 3.3 mg/dL (ref 2.3–4.6)

## 2012-12-04 LAB — MAGNESIUM: Magnesium: 1.7 mg/dL (ref 1.5–2.5)

## 2012-12-04 MED ORDER — POTASSIUM CHLORIDE 20 MEQ/15ML (10%) PO LIQD
40.0000 meq | Freq: Once | ORAL | Status: AC
  Administered 2012-12-04: 40 meq
  Filled 2012-12-04: qty 30

## 2012-12-04 MED ORDER — MAGNESIUM SULFATE 50 % IJ SOLN
4.0000 g | Freq: Once | INTRAVENOUS | Status: DC
  Filled 2012-12-04: qty 8

## 2012-12-04 MED ORDER — MAGNESIUM SULFATE 40 MG/ML IJ SOLN
4.0000 g | Freq: Once | INTRAMUSCULAR | Status: AC
  Administered 2012-12-04: 4 g via INTRAVENOUS
  Filled 2012-12-04: qty 100

## 2012-12-04 NOTE — Clinical Social Work Note (Signed)
Clinical Social Worker continuing to follow patient and family for support and discharge planning needs.  CSW spoke at length with patient niece (legal guardian) over the phone to discuss family concerns and patient plans at discharge.  Patient niece states that patient insurance will transition from [facility] to traditional Medicare December 09, 2012.  Patient niece remains hopeful for placement at either [facility] or [facility], however per CM patient is not currently meeting medical criteria for LTAC placement.  Per MD, patient secretions have become manageable with plans for patient to transition to the floor.  Patient niece is understanding of process, however remains head strong about patient plans for discharge and the inability for SNF to best manage her at discharge.  Patient niece verbalized her disappointment in lack of communication regarding discharge planning throughout patient hospitalization - CSW eased patient family concerns at this time.  CSW remains available for support and to facilitate patient discharge needs once patient secretions are manageable for facility placement.  Macario Golds, [place] [phone]

## 2012-12-04 NOTE — Progress Notes (Signed)
Peg tube dressing saturated with foul smelling , purulent secretions.  Dressing changed and culture of drainage sent to lab

## 2012-12-04 NOTE — Progress Notes (Signed)
RN notified to order disposable inner cannulas.

## 2012-12-04 NOTE — Progress Notes (Addendum)
PULMONARY  / CRITICAL CARE MEDICINE  Name: Jenny Brady MRN: [id number] DOB: 11/15/1925    ADMISSION DATE:  11/11/2012 CONSULTATION DATE:  11/11/2012  REFERRING MD :  EDP PRIMARY SERVICE:  PCCM  CHIEF COMPLAINT:  Acute respiratory failure  BRIEF PATIENT DESCRIPTION: 79 y/o with past medical history of recent CVA with resulting R hemiplegia and aphagia, s/p PEG placement and chronic Foley cath brought to ED with altered mental status and hypoxia.  In ED intubated for airway protection / hypoxemia.  Large amount of pooled secretions noted obstructing oropharynx during intubation. R heel hematoma debrided by EDP. Metabolic disarray.  PCCM was consulted for admit.   SIGNIFICANT EVENTS / STUDIES:  8/04 - Admit, acute on chronic resp fx in setting of CVA 8/07 - Replaced malpositioned PEG  8/12 - extubated, reintubnated in 30 min , severe cord edema 8/13 - weaning in chair 8/15 - trach 8/25 - mod secretions, pending bed placement at SNF  LINES / TUBES: ETT 8/4 >>>8/12, 8/12 (stridor) >> 8/14  G tube PTA >> 8/7 Trach 8/14 (DF) >> G tube 8/7 >>   CULTURES: 8/4 Blood >>>Neg 8/4 Urine >>> negative Urine 8/17 >>  00 K YEAST Resp  8/17 >  MRSA, vanc sens  Blood 8/17 >>  neg    ANTIBIOTICS: Zosyn 8/4 >>>off Vancomycin 8/4 >  8/8 Vanc 8/16 >>8/25 Ceftaz 8/16 >  8./8  INTERVAL HISTORY:  Remains on TC  VITAL SIGNS: Temp:  [98 F (36.7 C)-99.1 F (37.3 C)] 98.5 F (36.9 C) (08/27 0711) Pulse Rate:  [79-108] 86 (08/27 0800) Resp:  [15-22] 15 (08/27 0800) BP: (89-107)/(47-58) 102/47 mmHg (08/27 0800) SpO2:  [98 %-100 %] 100 % (08/27 0800) FiO2 (%):  [28 %] 28 % (08/27 0831)  VENTILATOR SETTINGS: Vent Mode:  [-]  FiO2 (%):  [28 %] 28 %  INTAKE / OUTPUT: Intake/Output     08/26 0701 - 08/27 0700 08/27 0701 - 08/28 0700   I.V. (mL/kg)     NG/GT 1110 80   IV Piggyback     Total Intake(mL/kg) 1110 (15) 80 (1.1)   Urine (mL/kg/hr) 3400 (1.9) 125 (0.4)   Total Output 3400 125    Net -2290 -45        Stool Occurrence 1 x 1 x     PHYSICAL EXAMINATION: General:  No distress Neuro:  Chronic right sided hemiplegia, aphasic, appears less angry Eyes: She favors left eye closed and resists exam, when I do open it, eye appears without redness or selling Neck : Trach site clean, no secretions Cardiovascular:  RRR s M Lungs: CTA anterior Abdomen:  Soft, nontender, bowel sounds wnl , PEG site intact, chronic foley Musculoskeletal:  No edema Skin:  Debrided hematoma R heel w/ dsg, sacral ulcer stg 1-2   LABS: PULMONARY  CBC  Recent Labs Lab 11/28/12 0635 12/03/12 0530  HGB 9.5* 8.1*  HCT 29.4* 25.2*  WBC 7.1 7.6  PLT 338 303   CHEMISTRY  Recent Labs Lab 11/28/12 0635 12/03/12 0530 12/04/12 0420  NA 138 137 139  K 3.8 3.8 3.3*  CL 103 102 102  CO2 27 28 30   GLUCOSE 138* 145* 154*  BUN 14 17 19   CREATININE 0.27* 0.33* 0.36*  CALCIUM 8.6 8.2* 8.4  MG 1.9  --  1.7  PHOS 3.4  --  3.3   Estimated Creatinine Clearance: 49.6 ml/min (by C-G formula based on Cr of 0.36).  ENDOCRINE CBG (last 3)   Recent Labs  12/04/12 0046 12/04/12 0413 12/04/12 0814  GLUCAP 124* 130* 135*    Dg Chest Port 1 View  12/03/2012   *RADIOLOGY REPORT*  Clinical Data: Short of breath, evaluate for airspace disease  PORTABLE CHEST - 1 VIEW  Comparison: Prior chest x-ray 11/28/2012  Findings: Stable position of tracheostomy tube.  The tip is midline and at the level of the clavicles.  Left upper extremity PICC in similar position.  The catheter tip projects over the mid superior vena cava.  Stable cardiomegaly.  Pulmonary vascular congestion is slightly increased compared to prior.  There is likely mild interstitial edema.  Dense left retrocardiac opacity similar to prior imaging and may reflect atelectasis or infiltrate.  No acute osseous abnormality.  IMPRESSION:  Increasing pulmonary vascular congestion now with mild interstitial edema.  Stable cardiomegaly.  Similar  left retrocardiac opacity which may reflect atelectasis and / or infiltrate.  Stable and satisfactory support apparatus.   Original Report Authenticated By: Malachy Moan, M.D.    ASSESSMENT / PLAN:  PULMONARY A:   Acute respiratory failure - likely secondary to obstruction of oropharynx with secretions. 8/12 stridor, cord edema-->8/14 trach MRSA tracheobronchitis  Improved secretions with lasix given P:   - Cont ATC as tolerated - Abx per ID section. - PRN suctioning  - Scopolamine patch initiated 8/25 for secretions, with close observation temp curve -Rt has only suctioned once all day, consider transfer to medical floor -maintain even balance to slight neg  CARDIOVASCULAR A:  Hypotension - resolved Hyperlipidemia  P:  - Cont to monitor. - Cont simvastatin though clearly the time for risk reduction is long gone in this setting   RENAL A Hypokalemia, hypomag P:   - replace k, mag -hold lasix today -even balance goals -has helped secretions  GASTROINTESTINAL A:   Malnutrition.   Dysphagia - S/p PEG Malfunctioning PEG - replaced 8/7 P:   - Cont TFs - Cont Acid SUP  HEMATOLOGIC   A: Mild anemia P:  - avoid phlebotomy when able  INFECTIOUS A:   Fever - low grade, tmax 100.2 MRSA tracheobronchitis P:   -off abx Monitor temp curve Will re eval eye, may need optho  ENDOCRINE  A:  Hyperglycemia resolved Hypothyroidism   P:   - Monitor off SSI. - Continue Levothyroxine @ present dose   NEUROLOGIC A:   H/o CVA, dementia Profound debilitation P:   - Cont current Rx  DISPO -Awaiting SNF placement.  To floor, secretions improved  Mcarthur Rossetti. Tyson Alias, MD, FACP Pgr: [phone] [place] [facility]

## 2012-12-04 NOTE — Progress Notes (Addendum)
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  Patient remains on trach collar.  Jevity 1.2 formula infusing at 50 ml/hr via PEG tube with Prostat liquid protein 30 ml twice daily providing 1640 total kcals, 97 gm protein, 968 ml of free water.  Also receiving MVI tablet via tube daily.  Disposition: awaiting SNF placement.  Height: Ht Readings from Last 1 Encounters:  11/12/12 5\' 3"  (1.6 m)    Weight Status:   Wt Readings from Last 1 Encounters:  12/03/12 163 lb 5.8 oz (74.1 kg)    Body mass index is 28.95 kg/(m^2).  Re-estimated needs:  Kcal: 1500-1700 Protein: 90-100 gm Fluid: 1.5-1.7 L  Skin: Stage II pressure ulcer to sacrum, unstageable to R heel  Diet Order: NPO   Intake/Output Summary (Last 24 hours) at 12/04/12 0926 Last data filed at 12/04/12 0853  Gross per 24 hour  Intake   1090 ml  Output   3125 ml  Net  -2035 ml    Labs:   Recent Labs Lab 11/28/12 0635 12/03/12 0530 12/04/12 0420  NA 138 137 139  K 3.8 3.8 3.3*  CL 103 102 102  CO2 27 28 30   BUN 14 17 19   CREATININE 0.27* 0.33* 0.36*  CALCIUM 8.6 8.2* 8.4  MG 1.9  --  1.7  PHOS 3.4  --  3.3  GLUCOSE 138* 145* 154*    CBG (last 3)   Recent Labs  12/03/12 2014 12/04/12 0046 12/04/12 0413  GLUCAP 117* 124* 130*    Scheduled Meds: . antiseptic oral rinse  1 application Mouth Rinse QID  . chlorhexidine  15 mL Mouth/Throat BID  . cholecalciferol  2,000 Units  Oral Daily  . cyanocobalamin  500 mcg Per Tube Daily  . enoxaparin (LOVENOX) injection  40 mg Subcutaneous Q24H  . feeding supplement (JEVITY 1.2 CAL)  1,000 mL Per Tube Q24H  . feeding supplement  30 mL Per Tube BID  . insulin aspart  0-24 Units Subcutaneous Q4H  . levothyroxine  88 mcg Per Tube QAC breakfast  . memantine  5 mg Per Tube BID  . multivitamin with minerals  1 tablet Per Tube Daily  . pantoprazole sodium  40 mg Per Tube Daily  . scopolamine  1 patch Transdermal Q72H  . simvastatin  20 mg Oral q1800  . sodium chloride  10-40 mL Intracatheter Q12H    Continuous Infusions:   Maureen Chatters, RD, LDN Pager #: [phone] After-Hours Pager #: [phone]

## 2012-12-05 ENCOUNTER — Inpatient Hospital Stay (HOSPITAL_COMMUNITY): Payer: Medicare Other

## 2012-12-05 LAB — GLUCOSE, CAPILLARY
Glucose-Capillary: 124 mg/dL — ABNORMAL HIGH (ref 70–99)
Glucose-Capillary: 110 mg/dL — ABNORMAL HIGH (ref 70–99)
Glucose-Capillary: 131 mg/dL — ABNORMAL HIGH (ref 70–99)

## 2012-12-05 LAB — BASIC METABOLIC PANEL
BUN: 15 mg/dL (ref 6–23)
CO2: 29 mEq/L (ref 19–32)
Calcium: 8.2 mg/dL — ABNORMAL LOW (ref 8.4–10.5)
Chloride: 104 mEq/L (ref 96–112)
Creatinine, Ser: 0.35 mg/dL — ABNORMAL LOW (ref 0.50–1.10)

## 2012-12-05 MED ORDER — POLYVINYL ALCOHOL 1.4 % OP SOLN: 1.0000 [drp] | OPHTHALMIC | Status: DC | PRN

## 2012-12-05 MED ORDER — HEPARIN SODIUM (PORCINE) 5000 UNIT/ML IJ SOLN
5000.0000 [IU] | Freq: Three times a day (TID) | INTRAMUSCULAR | Status: DC
  Administered 2012-12-05 – 2012-12-10 (×15): 5000 [IU] via SUBCUTANEOUS
  Filled 2012-12-05 (×18): qty 1

## 2012-12-05 NOTE — Progress Notes (Signed)
PEG tube found to be draining excessive amount of what appears to be tube feeding. PEG site is firm and tender to touch. Dr. Tyson Alias is on unit and made aware. New orders for CT scan

## 2012-12-05 NOTE — Progress Notes (Signed)
PULMONARY  / CRITICAL CARE MEDICINE  Name: Jenny Brady MRN: [id number] DOB: September 19, 1925    ADMISSION DATE:  11/11/2012 CONSULTATION DATE:  11/11/2012  REFERRING MD :  EDP PRIMARY SERVICE:  PCCM  CHIEF COMPLAINT:  Acute respiratory failure  BRIEF PATIENT DESCRIPTION: 79 y/o with past medical history of recent CVA with resulting R hemiplegia and aphagia, s/p PEG placement and chronic Foley cath brought to ED with altered mental status and hypoxia.  In ED intubated for airway protection / hypoxemia.  Large amount of pooled secretions noted obstructing oropharynx during intubation. R heel hematoma debrided by EDP. Metabolic disarray.  PCCM was consulted for admit.   SIGNIFICANT EVENTS / STUDIES:  8/04 - Admit, acute on chronic resp fx in setting of CVA 8/07 - Replaced malpositioned PEG  8/12 - extubated, reintubnated in 30 min , severe cord edema 8/13 - weaning in chair 8/15 - trach 8/25 - mod secretions, pending bed placement at [facility] 8/28- concern peg fxn  LINES / TUBES: ETT 8/4 >>>8/12, 8/12 (stridor) >> 8/14  G tube PTA >> 8/7 Trach 8/14 (DF) >>  CULTURES: 8/4 Blood >>>Neg 8/4 Urine >>> negative Urine 8/17 >>  00 K YEAST Resp  8/17 >  MRSA, vanc sens  Blood 8/17 >>  neg    ANTIBIOTICS: Zosyn 8/4 >>>off Vancomycin 8/4 >  8/8 Vanc 8/16 >>8/25 Ceftaz 8/16 >  8./8  INTERVAL HISTORY:  PEG tube residuals pressure concnered Neg 900 cc D/w med POA, remain in sdu  VITAL SIGNS: Temp:  [97.5 F (36.4 C)-98.5 F (36.9 C)] 97.8 F (36.6 C) (08/28 0753) Pulse Rate:  [75-104] 91 (08/28 0753) Resp:  [13-19] 13 (08/28 0753) BP: (91-113)/(47-63) 113/61 mmHg (08/28 0753) SpO2:  [93 %-100 %] 99 % (08/28 0753) FiO2 (%):  [28 %] 28 % (08/28 0829)  VENTILATOR SETTINGS: Vent Mode:  [-]  FiO2 (%):  [28 %] 28 %  INTAKE / OUTPUT: Intake/Output     08/27 0701 - 08/28 0700 08/28 0701 - 08/29 0700   I.V. (mL/kg) 10 (0.1)    NG/GT 640    IV Piggyback 100    Total Intake(mL/kg) 750  (10.1)    Urine (mL/kg/hr) 1625 (0.9) 100 (0.3)   Total Output 1625 100   Net -875 -100        Stool Occurrence 2 x      PHYSICAL EXAMINATION: General:  No distress Neuro:  Chronic right sided hemiplegia, aphasic Eyes: She favors left eye closed and resists exam Neck : Trach site clean, no secretions Cardiovascular:  RRR s M Lungs: CTA anterior Abdomen:  Soft, nontender, bowel sounds wnl , PEG site intact mild distention around Musculoskeletal:  No edema Skin:  Debrided hematoma R heel w/ dsg, sacral ulcer stg 1-2   LABS: PULMONARY  CBC  Recent Labs Lab 12/03/12 0530  HGB 8.1*  HCT 25.2*  WBC 7.6  PLT 303   CHEMISTRY  Recent Labs Lab 12/03/12 0530 12/04/12 0420 12/05/12 0610  NA 137 139 139  K 3.8 3.3* 4.1  CL 102 102 104  CO2 28 30 29   GLUCOSE 145* 154* 146*  BUN 17 19 15   CREATININE 0.33* 0.36* 0.35*  CALCIUM 8.2* 8.4 8.2*  MG  --  1.7 2.3  PHOS  --  3.3  --    Estimated Creatinine Clearance: 49.6 ml/min (by C-G formula based on Cr of 0.35).  ENDOCRINE CBG (last 3)   Recent Labs  12/04/12 2346 12/05/12 0454 12/05/12 0753  GLUCAP 128* 124* 110*    No results found.  ASSESSMENT / PLAN:  PULMONARY A:   Acute respiratory failure -aspiration 8/12 stridor, cord edema-->8/14 trach MRSA tracheobronchitis  chf component P:   - Cont ATC as tolerated - suctioning improved -allow continued neg balance, was 900 neg -This should be a LIFE long trach, I d/w med poa as well  CARDIOVASCULAR A:  Hyperlipidemia  P:  - Cont to monitor. - Cont simvastatin  RENAL A Hypokalemia, hypomag P:   - h/o chf, allow neg balance -goal 500 cc daily neg -avoid daily chem  GASTROINTESTINAL A:   Malnutrition.   Dysphagia - S/p PEG Malfunctioning PEG - replaced 8/7, now concern again 8/28 P:   - hold tf - Cont Acid SUP -will call IR, consider KUB with contrast -may need CT abdo npo  HEMATOLOGIC   A: Mild anemia P:  - avoid phlebotomy when  able  INFECTIOUS A:   Fever - low grade, tmax 100.2 MRSA tracheobronchitis P:   -off abx Called optho, they will evaluate , much appreciated Add art tears Avoid ABX when able for cdiff  ENDOCRINE  A:  Hyperglycemia resolved Hypothyroidism   P:   -Levothyroxine  NEUROLOGIC A:   H/o CVA, dementia Profound debilitation P:   -will re cal back wound care -optho to see eye -avoid benzo  DISPO -Awaiting SNF vs LTACH -family prefer [facility]  Mcarthur Rossetti. Tyson Alias, MD, FACP Pgr: [phone] [facility]

## 2012-12-05 NOTE — Consult Note (Signed)
WOC consult Note Reason for Consult: Consult requested for right heel wound.  Initial consult by WOC team performed on 8/5; refer to previous progress notes.  Another consult was performed on 8/24.  Agree with previously ordered plan of care.  Prevalon boots intact to reduce pressure to BLE.  Moist gauze dressings have been ordered BID to promote healing to RLE.  Wound faily unchanged from previous assessment. Difficult to palpate pedal pulse to RLE. Wound type: Unstageable Pressure Ulcer POA: Yes Measurement: 6X9cm Wound bed: 40% red and dry, 60% black hard eschar. Drainage (amount, consistency, odor) Scant amt blood-tinged drainage when dressing removed, no odor. Periwound: Generalized erythemia surrounding wound. Dressing procedure/placement/frequency: Continue present plan of care with moist gauze to promote healing to open areas.  It is best practice to leave dry stable eschar intact to lower extremities. Pressure is appropriately offloaded.  Multiple systemic factors may be barriers to wound healing. Please re-consult if further assistance is needed.  Thank-you,  Cammie Mcgee MSN, RN, CWOCN, [place], CNS [phone]

## 2012-12-05 NOTE — Progress Notes (Signed)
MD called from radiology to inform nurse that PEG tube was found to be located in the SQ tissue. Dr. Vassie Loll paged and made aware. PEG tube is not currently being used.

## 2012-12-05 NOTE — Progress Notes (Signed)
[facility] TEAM  Patient Details Name: Jenny Brady MRN: [id number] DOB: Nov 08, 1925  Pt secretion management improving per RN.  Request order for PMSV when appropriate. Thank you! Georgine Wiltse B. Murvin Natal [facility], CCC-SLP [phone]    [name] 12/05/2012, 2:30 PM

## 2012-12-06 ENCOUNTER — Inpatient Hospital Stay (HOSPITAL_COMMUNITY): Payer: Medicare Other

## 2012-12-06 LAB — GLUCOSE, CAPILLARY
Glucose-Capillary: 123 mg/dL — ABNORMAL HIGH (ref 70–99)
Glucose-Capillary: 92 mg/dL (ref 70–99)
Glucose-Capillary: 92 mg/dL (ref 70–99)
Glucose-Capillary: 96 mg/dL (ref 70–99)

## 2012-12-06 MED ORDER — IOHEXOL 300 MG/ML  SOLN
50.0000 mL | Freq: Once | INTRAMUSCULAR | Status: AC | PRN
  Administered 2012-12-06: 20 mL via INTRAVENOUS

## 2012-12-06 MED ORDER — ARTIFICIAL TEARS OP OINT
TOPICAL_OINTMENT | Freq: Every day | OPHTHALMIC | Status: DC
  Administered 2012-12-07 – 2012-12-08 (×2): via OPHTHALMIC
  Administered 2012-12-09: 1 via OPHTHALMIC
  Filled 2012-12-06: qty 3.5

## 2012-12-06 MED ORDER — PREDNISOLONE ACETATE 1 % OP SUSP
1.0000 [drp] | Freq: Four times a day (QID) | OPHTHALMIC | Status: DC
  Administered 2012-12-06 – 2012-12-12 (×21): 1 [drp] via OPHTHALMIC
  Filled 2012-12-06 (×2): qty 5

## 2012-12-06 MED ORDER — POLYVINYL ALCOHOL 1.4 % OP SOLN
1.0000 [drp] | Freq: Four times a day (QID) | OPHTHALMIC | Status: DC
  Administered 2012-12-06 – 2012-12-12 (×19): 1 [drp] via OPHTHALMIC
  Filled 2012-12-06: qty 15

## 2012-12-06 NOTE — Progress Notes (Signed)
PULMONARY  / CRITICAL CARE MEDICINE  Name: SYLVAN LAHM MRN: [id number] DOB: 1925/05/05    ADMISSION DATE:  11/11/2012 CONSULTATION DATE:  11/11/2012  REFERRING MD :  EDP PRIMARY SERVICE:  PCCM  CHIEF COMPLAINT:  Acute respiratory failure  BRIEF PATIENT DESCRIPTION: 79 y/o with past medical history of recent CVA with resulting R hemiplegia and aphagia, s/p PEG placement and chronic Foley cath brought to ED with altered mental status and hypoxia.  In ED intubated for airway protection / hypoxemia.  Large amount of pooled secretions noted obstructing oropharynx during intubation. R heel hematoma debrided by EDP. Metabolic disarray.  PCCM was consulted for admit.   SIGNIFICANT EVENTS / STUDIES:  8/04 - Admit, acute on chronic resp fx in setting of CVA 8/07 - Replaced malpositioned PEG  8/12 - extubated, reintubnated in 30 min , severe cord edema 8/13 - weaning in chair 8/15 - trach 8/25 - mod secretions, pending bed placement at [facility] 8/28- concern peg fxn 8/28 CT abdo-Gastrostomy tube has been partially withdrawn with the balloon now<BR>located within the subcutaneous soft tissues external to the<BR>stomach 8/29- peg replaced (again)  LINES / TUBES: ETT 8/4 >>>8/12, 8/12 (stridor) >> 8/14  G tube PTA >> 8/7 Trach 8/14 (DF) >>  CULTURES: 8/4 Blood >>>Neg 8/4 Urine >>> negative Urine 8/17 >>  00 K YEAST Resp  8/17 >  MRSA, vanc sens  Blood 8/17 >>  neg    ANTIBIOTICS: Zosyn 8/4 >>>off Vancomycin 8/4 >  8/8 Vanc 8/16 >>8/25 Ceftaz 8/16 >  8./8  INTERVAL HISTORY:  PEG replaced Neg a liter  VITAL SIGNS: Temp:  [97 F (36.1 C)-98.6 F (37 C)] 97 F (36.1 C) (08/29 1122) Pulse Rate:  [57-107] 80 (08/29 1122) Resp:  [11-19] 14 (08/29 1122) BP: (107-133)/(56-93) 113/62 mmHg (08/29 1122) SpO2:  [95 %-100 %] 100 % (08/29 1122) FiO2 (%):  [28 %] 28 % (08/29 1122) Weight:  [100.6 kg (221 lb 12.5 oz)] 100.6 kg (221 lb 12.5 oz) (08/29 0414)  VENTILATOR SETTINGS: Vent Mode:   [-]  FiO2 (%):  [28 %] 28 %  INTAKE / OUTPUT: Intake/Output     08/28 0701 - 08/29 0700 08/29 0701 - 08/30 0700   I.V. (mL/kg) 10 (0.1)    NG/GT     IV Piggyback     Total Intake(mL/kg) 10 (0.1)    Urine (mL/kg/hr) 1100 (0.5) 125 (0.2)   Total Output 1100 125   Net -1090 -125        Stool Occurrence 1 x      PHYSICAL EXAMINATION: General:  No distress Neuro:  Chronic right sided hemiplegia, aphasic Eyes: She favors left eye closed and resists exam, see prior exams Neck : Trach site clean, no secretions at all this am  Cardiovascular:  RRR s M Lungs: CTA anterior Abdomen:  Soft, nontender, bowel sounds wnl , PEG dressed, no further fullness Musculoskeletal:  No edema Skin:  Debrided hematoma R heel w/ dsg, sacral ulcer stg 1-2   LABS: PULMONARY  CBC  Recent Labs Lab 12/03/12 0530  HGB 8.1*  HCT 25.2*  WBC 7.6  PLT 303   CHEMISTRY  Recent Labs Lab 12/03/12 0530 12/04/12 0420 12/05/12 0610  NA 137 139 139  K 3.8 3.3* 4.1  CL 102 102 104  CO2 28 30 29   GLUCOSE 145* 154* 146*  BUN 17 19 15   CREATININE 0.33* 0.36* 0.35*  CALCIUM 8.2* 8.4 8.2*  MG  --  1.7 2.3  PHOS  --  3.3  --    Estimated Creatinine Clearance: 64.4 ml/min (by C-G formula based on Cr of 0.35).  ENDOCRINE CBG (last 3)   Recent Labs  12/06/12 0011 12/06/12 0416 12/06/12 0753  GLUCAP 92 96 98    Ct Abdomen Pelvis Wo Contrast  12/05/2012   *RADIOLOGY REPORT*  Clinical Data:  Swelling and redness surrounding PEG tube site question infection  CT ABDOMEN AND PELVIS WITHOUT CONTRAST  Technique:  Multidetector CT imaging of the abdomen and pelvis was performed following the standard protocol without intravenous contrast. Sagittal and coronal MPR images reconstructed from axial data set.  Oral contrast was not administered.  IV contrast not utilized due to contrast allergy.  Sagittal and coronal MPR images reconstructed from axial data set.  Comparison: 09/16/2012  Findings: Bibasilar  atelectasis. Scattered atherosclerotic calcifications aorta and coronary arteries. Percutaneous gastrostomy tube balloon is located within the subcutaneous tissues, not within the stomach; the patient's nurse was immediately notified of this finding prior to completion of this report at 1610 hours on 12/05/2012. Small amount of fluid and gas is seen deep to the balloon within the subcutaneous soft tissue in the abdominal wall, 2.5 x 2.1 cm in size, could be abscess or simply air / fluid from attempted tube usage.  Within limits of a nonenhanced exam no focal abnormalities of the liver, spleen, pancreas, or adrenal glands. Kidneys symmetric in size with normal size left renal collecting system and minimal dilatation of the right renal collecting system, though both collecting systems appear decreased in degree of dilatation versus prior exam. Urinary bladder decompressed by Foley catheter. Scattered colonic diverticulosis without evidence of diverticulitis or gross mass. Stomach and small bowel loops unremarkable.  Normal-appearing appendix, uterus and adnexae. Prominent stool ball in rectum. No mass, adenopathy, or free fluid. Stranding of the presacral space unchanged. No gross evidence of hernia. Bones markedly demineralized.  IMPRESSION: Gastrostomy tube has been partially withdrawn with the balloon now located within the subcutaneous soft tissues external to the stomach; recommend replacement (patient's nurse notified as above). Small amount of fluid and gas surrounding the balloon at the gastrostomy tube tract could represent abscess or simply air and fluid from attempted tube usage. Diffuse colonic diverticulosis without evidence of diverticulitis. Decreased renal collecting system dilatation bilaterally versus previous exam.   Original Report Authenticated By: Ulyses Southward, M.D.   Ir Replc Gastro/colonic Tube Percut W/fluoro  12/06/2012   *RADIOLOGY REPORT*  Clinical Data: Retracted malpositioned balloon  retention gastrostomy  GASTROSTOMY CATHETER REPLACEMENT  Comparison: 11/14/2012  Findings: Existing gastrostomy was injected with contrast under fluoroscopy.  This demonstrates retraction of the balloon retention tube within the intra-abdominal wall subcutaneous tissues. Percutaneous tract to the stomach remains patent.  Kumpe catheter and guide wire were advanced through the percutaneous tract following removal of the malpositioned tube.  Contrast injection reconfirms intragastric position.  A new 20-French balloon retention gastrostomy was advanced into the stomach over the guide wire.  Balloon tip was inflated with 12 ml saline containing 1 ml contrast.  This appeared to remain within the stomach with gentle retraction.  Contrast injection reconfirms position in the stomach. Access ready for use.  IMPRESSION: Replacement of retracted gastrostomy back into the stomach.   Original Report Authenticated By: Judie Petit. Shick, M.D.    ASSESSMENT / PLAN:  PULMONARY A:   Acute respiratory failure -aspiration 8/12 stridor, cord edema-->8/14 trach -This should be a LIFE long trach MRSA tracheobronchitis  chf component P:   - Cont ATC as tolerated -  suctioning improved -allow continued neg balance, as renal tolerates -PMV attempt now as secretions better  CARDIOVASCULAR A:  Hyperlipidemia  P:  - simvastatin follow BP as neg balance continued  RENAL A Hypokalemia, hypomag P:   - h/o chf, allow neg balance -goal 500 cc daily neg -chem in am as was neg 1 liter again  GASTROINTESTINAL A:   Malnutrition.   Dysphagia - S/p PEG Malfunctioning PEG - replaced 8/7 and 8/28 P:   - Cont Acid SUP Npo, consider start Tf again today  HEMATOLOGIC   A: Mild anemia P:  - avoid phlebotomy when able  INFECTIOUS A:   Fever - low grade, tmax 100.2 MRSA tracheobronchitis P:   -CT findings a round balloon unlikely abscess as was balloon on exam in skin  ENDOCRINE  A:  Hyperglycemia  resolved Hypothyroidism   P:   -Levothyroxine  NEUROLOGIC A:   H/o CVA, dementia Profound debilitation P:   -will re cal back wound care - they recommend same treatment to continue, no role debridement escar -optho to see eye left -avoid benzo  DISPO [facility] sept 1  Mcarthur Rossetti. Tyson Alias, MD, FACP Pgr: [phone] [facility]

## 2012-12-06 NOTE — Consult Note (Signed)
  79 yo female with dementia, bedridden. Patient favors right eye over left and appears to actually keep the left eye closed at all times and resists opening. Seems to have no trouble with the right.  VA: unable: patient is nonverbal  Pupils: 5/32mm RRRL  No APD(but difficult to assess secondary to poor cooperation) EOMs: No apparent eso- or exotropia (also difficult to assess secondary to poor cooperation) Tonopen IOPs: OD:15                          OS: 14 Penlight Exam:   Lids/Lashes: Normal OU Conj: Clear OU Cornea: Clear  OU with arcus OU OS: 1 large filament at 3:00 position  No other staining of cornea AC: Deep and Formed OU Iris: Normal OU Lens: 2+ Nuclear Sclerotic Cataract OU  Assess: 1 Filamentary Keratitis OS--Likely the result of Chronic Dry Eye 2 Cataract OU  Plan: Start Pred Mild OS QID for 2 weeks Continue Artificial Tears QID OU Start Lacrilube/Refresh PM OU QHS   Trish Fountain MD

## 2012-12-07 DIAGNOSIS — Z8673 Personal history of transient ischemic attack (TIA), and cerebral infarction without residual deficits: Secondary | ICD-10-CM

## 2012-12-07 LAB — CULTURE, ROUTINE-ABSCESS: Gram Stain: NONE SEEN

## 2012-12-07 LAB — GLUCOSE, CAPILLARY
Glucose-Capillary: 106 mg/dL — ABNORMAL HIGH (ref 70–99)
Glucose-Capillary: 212 mg/dL — ABNORMAL HIGH (ref 70–99)
Glucose-Capillary: 108 mg/dL — ABNORMAL HIGH (ref 70–99)
Glucose-Capillary: 121 mg/dL — ABNORMAL HIGH (ref 70–99)

## 2012-12-07 LAB — BASIC METABOLIC PANEL
CO2: 27 mEq/L (ref 19–32)
Calcium: 8.3 mg/dL — ABNORMAL LOW (ref 8.4–10.5)
Creatinine, Ser: 0.39 mg/dL — ABNORMAL LOW (ref 0.50–1.10)
Glucose, Bld: 154 mg/dL — ABNORMAL HIGH (ref 70–99)

## 2012-12-07 MED ORDER — VANCOMYCIN HCL 10 G IV SOLR
1250.0000 mg | INTRAVENOUS | Status: DC
  Administered 2012-12-07 – 2012-12-09 (×3): 1250 mg via INTRAVENOUS
  Filled 2012-12-07 (×4): qty 1250

## 2012-12-07 NOTE — Evaluation (Signed)
Passy-Muir Speaking Valve - Evaluation Patient Details  Name: Jenny Brady MRN: [id number] Date of Birth: 1925-09-09  Today's Date: 12/07/2012 Time: 1200-1306 SLP Time Calculation (min): 66 min  Past Medical History:  Past Medical History  Diagnosis Date  . Hyperthyroidism     had RAI-is on thyroid replacement, happened maybe this summer  . Coronary artery disease ?, before 2005    Had a catheterization per son by Dr. [name] in 1991 no reports in  [facility].  note from 2005 mentions hx of coronary stent  . GERD (gastroesophageal reflux disease)   . Stroke 07/2012    Right hemiplegia, dysphagia, aphasia.  . Dementia   . Aphasia 07/2012  . Chronic systolic heart failure     07/2012 the EF was 15%  . Mural thrombus of cardiac apex   . Obesity   . IBS (irritable bowel syndrome)   . DVT (deep venous thrombosis) before 2005    occurred after trauma, treated for a while with Coumadin.   Marland Kitchen Dysphagia due to recent stroke 07/2012    ok'd for purree/pudding thick liquids but G tube placed by IR  . OSA (obstructive sleep apnea)     refused CPAP per notes.    Past Surgical History:  Past Surgical History  Procedure Laterality Date  . Tee without cardioversion N/A 07/26/2012    Procedure: TRANSESOPHAGEAL ECHOCARDIOGRAM (TEE);  Surgeon: Laurey Morale, MD;  [facility] ENDOSCOPY;  Service: Cardiovascular;  Laterality: N/A;  . Gastric feeding tube  08/02/2012    placed in radiology   HPI:  79 yo with past medical history of recent CVA with resulting R hemiplegia and aphagia, s/p PEG placement and chronic Foley cath brought to ED with altered mental status and hypoxia.  In ED intubate for airway protection / hypoxemia.  Large amount of pooled secretions noted obstructing oropharynx during intubation. R heel hematoma debrided by EDP. Metabolic disarray. Pt received a Shiley #6 cuffed trach 8/14 and speech was consulted to assess appropriateness for Passy Muir Speaking Valve (PMSV).  Pt  condition deteriorated and ST signed off. ORders received to repeat PNSV eval once status improved.   Assessment / Plan / Recommendation Clinical Impression  Pt tolerated PMV for 30 minutes.  Intermittent vocalization noted.  VSS stable, and no evidence of CO2 retention upon removal of valve.  Pt maintained grimaced facial expression throughout trial, but did not otherwise appear uncomfortable or exhibit increased work of breathing.  Consulted with Dr. Maple Hudson and RN regarding improved tolerance of PMSV.  PMSV removed per RN request, as pt tends to doze off, and PMSV needs to be removed when sleeping and during breathing treatments.  Encouraged RN to place valve whenever possible to facilitate more normal secretion management, and vocalizations.    SLP Assessment  Patient needs continued Speech Lanaguage Pathology Services    Follow Up Recommendations  Skilled Nursing facility;24 hour supervision/assistance    Frequency and Duration min 1 x/week  2 weeks   Pertinent Vitals/Pain No pain indicated    SLP Goals Potential to Achieve Goals: Fair Potential Considerations: Ability to learn/carryover information;Previous level of function SLP Goal #1 - Progress: Met SLP Goal #2: Pt will tolerate PMSV during waking hours, except during breathing treatments with respiratory therapy   PMSV Trial  PMSV was placed for: 30 minutes Able to redirect subglottic air through upper airway: Yes Able to Attain Phonation: Yes Voice Quality: Normal Able to Expectorate Secretions: No Breath Support for Phonation: Other (comment) Intelligibility:  Unable to assess (comment) Respirations During Trial: 18 SpO2 During Trial: 99 % Pulse During Trial: 89 Behavior: Alert;Poor eye contact;Quiet   Tracheostomy Tube  Additional Tracheostomy Tube Assessment Trach Collar Period: since 11/22/12 Secretion Description: thick cream colored secretions at trach hub Frequency of Tracheal Suctioning: x2 today, x4 overnight  per RN Level of Secretion Expectoration: Tracheal    Vent Dependency  Vent Dependent: No FiO2 (%): 28 %    Cuff Deflation Trial Tolerated Cuff Deflation: Yes Behavior: Alert;Poor eye contact;Other (comment);Quiet (not communicative)   Jenny Brady 12/07/2012, 1:07 PM Jenny Brady B. Murvin Natal [facility], CCC-SLP [phone]

## 2012-12-07 NOTE — Progress Notes (Signed)
PULMONARY  / CRITICAL CARE MEDICINE  Name: Jenny Brady MRN: [id number] DOB: [birth year]    ADMISSION DATE:  11/11/2012 CONSULTATION DATE:  11/11/2012  REFERRING MD :  EDP PRIMARY SERVICE:  PCCM  CHIEF COMPLAINT:  Acute respiratory failure  BRIEF PATIENT DESCRIPTION: 79 y/o with past medical history of recent CVA with resulting R hemiplegia and aphagia, s/p PEG placement and chronic Foley cath brought to ED with altered mental status and hypoxia.  In ED intubated for airway protection / hypoxemia.  Large amount of pooled secretions noted obstructing oropharynx during intubation. R heel hematoma debrided by EDP. Metabolic disarray.  PCCM was consulted for admit.   SIGNIFICANT EVENTS / STUDIES:  8/04 - Admit, acute on chronic resp fx in setting of CVA 8/07 - Replaced malpositioned PEG  8/12 - extubated, reintubnated in 30 min , severe cord edema 8/13 - weaning in chair 8/15 - trach 8/25 - mod secretions, pending bed placement at [facility] 8/28- concern peg fxn 8/28 CT abdo-Gastrostomy tube has been partially withdrawn with the balloon now<BR>located within the subcutaneous soft tissues external to the<BR>stomach 8/29- peg replaced (again)  LINES / TUBES: ETT 8/4 >>>8/12, 8/12 (stridor) >> 8/14  G tube PTA >> 8/7 Trach 8/14 (DF) >>  CULTURES: 8/4 Blood >>>Neg 8/4 Urine >>> negative Urine 8/17 >>  00 K YEAST Resp  8/17 >  MRSA, vanc sens  Blood 8/17 >>  neg    ANTIBIOTICS: Zosyn 8/4 >>>off Vancomycin 8/4 >  8/8 Vanc 8/16 >>8/25 Ceftaz 8/16 >  8./8  INTERVAL HISTORY:  8/30- Nurse called report MRSA at PEG wound site  VITAL SIGNS: Temp:  [97.5 F (36.4 C)-99 F (37.2 C)] 98.7 F (37.1 C) (08/30 0800) Pulse Rate:  [88-98] 91 (08/30 1154) Resp:  [17-20] 18 (08/30 1154) BP: (113-119)/(48-60) 113/54 mmHg (08/30 1154) SpO2:  [99 %-100 %] 100 % (08/30 1154) FiO2 (%):  [28 %] 28 % (08/30 1154) Weight:  [74.1 kg (163 lb 5.8 oz)] 74.1 kg (163 lb 5.8 oz) (08/30 0310)  VENTILATOR  SETTINGS: Vent Mode:  [-]  FiO2 (%):  [28 %] 28 %  INTAKE / OUTPUT: Intake/Output     08/29 0701 - 08/30 0700 08/30 0701 - 08/31 0700   I.V. (mL/kg)  10 (0.1)   Other  20   NG/GT 2900    Total Intake(mL/kg) 2900 (39.1) 30 (0.4)   Urine (mL/kg/hr) 850 (0.5)    Total Output 850     Net +2050 +30        Stool Occurrence 1 x      PHYSICAL EXAMINATION: General:  No distress Neuro:  Chronic right sided hemiplegia, aphasic Eyes: She favors left eye closed and resists exam, see prior exams Neck : Trach site clean, no secretions at all this am. SLP here- Shelbie Hutching in place Cardiovascular:  RRR s M Lungs: CTA anterior Abdomen:  Soft, nontender, bowel sounds wnl , PEG dressed,  Musculoskeletal:  No edema Skin:  Debrided hematoma R heel w/ dsg, sacral ulcer stg 1-2   LABS: PULMONARY  CBC  Recent Labs Lab 12/03/12 0530  HGB 8.1*  HCT 25.2*  WBC 7.6  PLT 303   CHEMISTRY  Recent Labs Lab 12/03/12 0530 12/04/12 0420 12/05/12 0610 12/07/12 0450  NA 137 139 139 138  K 3.8 3.3* 4.1 3.6  CL 102 102 104 102  CO2 28 30 29 27   GLUCOSE 145* 154* 146* 154*  BUN 17 19 15 18   CREATININE 0.33* 0.36* 0.35*  0.39*  CALCIUM 8.2* 8.4 8.2* 8.3*  MG  --  1.7 2.3  --   PHOS  --  3.3  --   --    Estimated Creatinine Clearance: 52.8 ml/min (by C-G formula based on Cr of 0.39).  ENDOCRINE CBG (last 3)   Recent Labs  12/07/12 0412 12/07/12 0744 12/07/12 1153  GLUCAP 106* 212* 74    Ct Abdomen Pelvis Wo Contrast  12/05/2012   *RADIOLOGY REPORT*  Clinical Data:  Swelling and redness surrounding PEG tube site question infection  CT ABDOMEN AND PELVIS WITHOUT CONTRAST  Technique:  Multidetector CT imaging of the abdomen and pelvis was performed following the standard protocol without intravenous contrast. Sagittal and coronal MPR images reconstructed from axial data set.  Oral contrast was not administered.  IV contrast not utilized due to contrast allergy.  Sagittal and coronal MPR  images reconstructed from axial data set.  Comparison: 09/16/2012  Findings: Bibasilar atelectasis. Scattered atherosclerotic calcifications aorta and coronary arteries. Percutaneous gastrostomy tube balloon is located within the subcutaneous tissues, not within the stomach; the patient's nurse was immediately notified of this finding prior to completion of this report at 1610 hours on 12/05/2012. Small amount of fluid and gas is seen deep to the balloon within the subcutaneous soft tissue in the abdominal wall, 2.5 x 2.1 cm in size, could be abscess or simply air / fluid from attempted tube usage.  Within limits of a nonenhanced exam no focal abnormalities of the liver, spleen, pancreas, or adrenal glands. Kidneys symmetric in size with normal size left renal collecting system and minimal dilatation of the right renal collecting system, though both collecting systems appear decreased in degree of dilatation versus prior exam. Urinary bladder decompressed by Foley catheter. Scattered colonic diverticulosis without evidence of diverticulitis or gross mass. Stomach and small bowel loops unremarkable.  Normal-appearing appendix, uterus and adnexae. Prominent stool ball in rectum. No mass, adenopathy, or free fluid. Stranding of the presacral space unchanged. No gross evidence of hernia. Bones markedly demineralized.  IMPRESSION: Gastrostomy tube has been partially withdrawn with the balloon now located within the subcutaneous soft tissues external to the stomach; recommend replacement (patient's nurse notified as above). Small amount of fluid and gas surrounding the balloon at the gastrostomy tube tract could represent abscess or simply air and fluid from attempted tube usage. Diffuse colonic diverticulosis without evidence of diverticulitis. Decreased renal collecting system dilatation bilaterally versus previous exam.   Original Report Authenticated By: Ulyses Southward, M.D.   Ir Replc Gastro/colonic Tube Percut  W/fluoro  12/06/2012   *RADIOLOGY REPORT*  Clinical Data: Retracted malpositioned balloon retention gastrostomy  GASTROSTOMY CATHETER REPLACEMENT  Comparison: 11/14/2012  Findings: Existing gastrostomy was injected with contrast under fluoroscopy.  This demonstrates retraction of the balloon retention tube within the intra-abdominal wall subcutaneous tissues. Percutaneous tract to the stomach remains patent.  Kumpe catheter and guide wire were advanced through the percutaneous tract following removal of the malpositioned tube.  Contrast injection reconfirms intragastric position.  A new 20-French balloon retention gastrostomy was advanced into the stomach over the guide wire.  Balloon tip was inflated with 12 ml saline containing 1 ml contrast.  This appeared to remain within the stomach with gentle retraction.  Contrast injection reconfirms position in the stomach. Access ready for use.  IMPRESSION: Replacement of retracted gastrostomy back into the stomach.   Original Report Authenticated By: Judie Petit. Shick, M.D.    ASSESSMENT / PLAN:  PULMONARY A:   Acute respiratory failure -aspiration 8/12  stridor, cord edema-->8/14 trach -This should be a LIFE long trach MRSA tracheobronchitis  chf component P:   - Cont ATC as tolerated - suctioning improved -allow continued neg balance, as renal tolerates -PMV attempt now as secretions better  CARDIOVASCULAR A:  Hyperlipidemia  P:  - simvastatin follow BP as neg balance continued  RENAL A Hypokalemia, hypomag P:   - h/o chf, allow neg balance -goal 500 cc daily neg   GASTROINTESTINAL A:   Malnutrition.   Dysphagia - S/p PEG Malfunctioning PEG - replaced 8/7 and 8/28 P:   - Cont Acid SUP Npo, consider start Tf again today  HEMATOLOGIC   A: Mild anemia P:  - avoid phlebotomy when able  INFECTIOUS A:   Fever - low grade, tmax 99, WBC wnl MRSA tracheobronchitis MRSA PEG site 8/30 phone report P:   -CT findings around balloon  unlikely abscess as was balloon on exam in skin -Will resume Vanc pending reassessment.  ENDOCRINE  A:  Hyperglycemia resolved Hypothyroidism   P:   -Levothyroxine  NEUROLOGIC A:   H/o CVA, dementia Profound debilitation P:   -will re cal back wound care - they recommend same treatment to continue, no role debridement escar -optho to see eye left -avoid benzo  DISPO [facility] sept 1  Mcarthur Rossetti. Tyson Alias, MD, FACP Pgr: [phone] [facility]

## 2012-12-07 NOTE — Progress Notes (Signed)
Call from lab that pt culture of abscess near peg tube indicates MRSA at site; notified Dr. Maple Hudson; will continue to monitor

## 2012-12-07 NOTE — Progress Notes (Signed)
ANTIBIOTIC CONSULT NOTE - INITIAL  Pharmacy Consult for Vancomycin Indication: MRSA PEG tube site abscess  Allergies  Allergen Reactions  . Hyoscyamine Other (See Comments)    Per MAR  . Ivp Dye [Iodinated Diagnostic Agents] Other (See Comments)    Per MAR  . Septra [Sulfamethoxazole W-Trimethoprim] Other (See Comments)    Per [facility]    Patient Measurements: Height: 5' 8.5" (174 cm) Weight: 163 lb 5.8 oz (74.1 kg) IBW/kg (Calculated) : 65.05 Adjusted Body Weight:   Vital Signs: Temp: 98.7 F (37.1 C) (08/30 0800) Temp src: Axillary (08/30 0800) BP: 117/66 mmHg (08/30 1200) Pulse Rate: 89 (08/30 1200) Intake/Output from previous day: 08/29 0701 - 08/30 0700 In: 2900 [NG/GT:2900] Out: 850 [Urine:850] Intake/Output from this shift: Total I/O In: 30 [I.V.:10; Other:20] Out: -   Labs:  Recent Labs  12/05/12 0610 12/07/12 0450  CREATININE 0.35* 0.39*   Estimated Creatinine Clearance: 52.8 ml/min (by C-G formula based on Cr of 0.39). No results found for this basename: VANCOTROUGH, VANCOPEAK, VANCORANDOM, GENTTROUGH, GENTPEAK, GENTRANDOM, TOBRATROUGH, TOBRAPEAK, TOBRARND, AMIKACINPEAK, AMIKACINTROU, AMIKACIN,  in the last 72 hours   Microbiology: Recent Results (from the past 720 hour(s))  CULTURE, BLOOD (ROUTINE X 2)     Status: None   Collection Time    11/11/12 10:10 PM      Result Value Range Status   Specimen Description BLOOD RIGHT WRIST   Final   Special Requests BOTTLES DRAWN AEROBIC ONLY 5CC   Final   Culture  Setup Time     Final   Value: 11/12/2012 05:56     Performed at [facility]   Culture     Final   Value: NO GROWTH 5 DAYS     Performed at [facility]   Report Status 11/18/2012 FINAL   Final  CULTURE, BLOOD (ROUTINE X 2)     Status: None   Collection Time    11/11/12 10:10 PM      Result Value Range Status   Specimen Description BLOOD HAND LEFT   Final   Special Requests BOTTLES DRAWN AEROBIC ONLY 5CC   Final   Culture  Setup  Time     Final   Value: 11/12/2012 05:56     Performed at [facility]   Culture     Final   Value: NO GROWTH 5 DAYS     Performed at [facility]   Report Status 11/18/2012 FINAL   Final  URINE CULTURE     Status: None   Collection Time    11/11/12 10:34 PM      Result Value Range Status   Specimen Description URINE, CATHETERIZED   Final   Special Requests CX ADDED AT 2300 ON [id number]   Final   Culture  Setup Time     Final   Value: 11/12/2012 05:57     Performed at [facility]   Colony Count     Final   Value: NO GROWTH     Performed at [facility]   Culture     Final   Value: NO GROWTH     Performed at [facility]   Report Status 11/13/2012 FINAL   Final  MRSA PCR SCREENING     Status: None   Collection Time    11/12/12  1:43 AM      Result Value Range Status   MRSA by PCR NEGATIVE  NEGATIVE Final   Comment:  The GeneXpert MRSA Assay (FDA     approved for NASAL specimens     only), is one component of a     comprehensive MRSA colonization     surveillance program. It is not     intended to diagnose MRSA     infection nor to guide or     monitor treatment for     MRSA infections.  URINE CULTURE     Status: None   Collection Time    11/12/12  1:43 AM      Result Value Range Status   Specimen Description URINE, CATHETERIZED   Final   Special Requests CX ADDED AT 0204 ON [id number]   Final   Culture  Setup Time     Final   Value: 11/12/2012 02:19     Performed at [facility]   Colony Count     Final   Value: NO GROWTH     Performed at [facility]   Culture     Final   Value: NO GROWTH     Performed at [facility]   Report Status 11/13/2012 FINAL   Final  URINE CULTURE     Status: None   Collection Time    11/24/12 12:22 AM      Result Value Range Status   Specimen Description URINE, CATHETERIZED   Final   Special Requests CX ADDED AT 0045 ON [id number]   Final   Culture  Setup Time      Final   Value: 11/24/2012 05:49     Performed at [facility]   Colony Count     Final   Value: >=100,000 COLONIES/ML     Performed at [facility]   Culture     Final   Value: YEAST     Performed at [facility]   Report Status 11/25/2012 FINAL   Final  CULTURE, BLOOD (ROUTINE X 2)     Status: None   Collection Time    11/24/12  1:45 AM      Result Value Range Status   Specimen Description BLOOD RIGHT HAND   Final   Special Requests BOTTLES DRAWN AEROBIC ONLY 1.5CC   Final   Culture  Setup Time     Final   Value: 11/24/2012 12:10     Performed at [facility]   Culture     Final   Value: NO GROWTH 5 DAYS     Performed at [facility]   Report Status 11/30/2012 FINAL   Final  CULTURE, RESPIRATORY (NON-EXPECTORATED)     Status: None   Collection Time    11/24/12 12:35 PM      Result Value Range Status   Specimen Description TRACHEAL ASPIRATE   Final   Special Requests Normal   Final   Gram Stain     Final   Value: ABUNDANT WBC PRESENT, PREDOMINANTLY PMN     NO SQUAMOUS EPITHELIAL CELLS SEEN     RARE GRAM POSITIVE COCCI IN PAIRS     Performed at [facility]   Culture     Final   Value: MODERATE METHICILLIN RESISTANT STAPHYLOCOCCUS AUREUS     Note: RIFAMPIN AND GENTAMICIN SHOULD NOT BE USED AS SINGLE DRUGS FOR TREATMENT OF STAPH INFECTIONS. CRITICAL RESULT CALLED TO, READ BACK BY AND VERIFIED WITH: ANITA M. @1230  ON 8.19.14 BY BUONO     Performed at [facility]   Report Status 11/26/2012 FINAL  Final   Organism ID, Bacteria METHICILLIN RESISTANT STAPHYLOCOCCUS AUREUS   Final  CULTURE, ROUTINE-ABSCESS     Status: None   Collection Time    12/04/12  6:32 AM      Result Value Range Status   Specimen Description ABSCESS   Final   Special Requests PEG TUBE INSERTION SITE   Final   Gram Stain     Final   Value: NO WBC SEEN     NO SQUAMOUS EPITHELIAL CELLS SEEN     RARE GRAM POSITIVE RODS     Performed at [facility]   Culture     Final   Value: MODERATE METHICILLIN RESISTANT STAPHYLOCOCCUS AUREUS     Note: RIFAMPIN AND GENTAMICIN SHOULD NOT BE USED AS SINGLE DRUGS FOR TREATMENT OF STAPH INFECTIONS. This organism DOES NOT demonstrate inducible Clindamycin resistance in vitro. CRITICAL RESULT CALLED TO, READ BACK BY AND VERIFIED WITH: BOBBIE 8/30 @ 950      BY REAMM     Performed at [facility]   Report Status 12/07/2012 FINAL   Final   Organism ID, Bacteria METHICILLIN RESISTANT STAPHYLOCOCCUS AUREUS   Final    Medical History: Past Medical History  Diagnosis Date  . Hyperthyroidism     had RAI-is on thyroid replacement, happened maybe this summer  . Coronary artery disease ?, before 2005    Had a catheterization per son by Dr. [name] in 1991 no reports in  [place].  note from 2005 mentions hx of coronary stent  . GERD (gastroesophageal reflux disease)   . Stroke 07/2012    Right hemiplegia, dysphagia, aphasia.  . Dementia   . Aphasia 07/2012  . Chronic systolic heart failure     07/2012 the EF was 15%  . Mural thrombus of cardiac apex   . Obesity   . IBS (irritable bowel syndrome)   . DVT (deep venous thrombosis) before 2005    occurred after trauma, treated for a while with Coumadin.   Marland Kitchen Dysphagia due to recent stroke 07/2012    ok'd for purree/pudding thick liquids but G tube placed by IR  . OSA (obstructive sleep apnea)     refused CPAP per notes.     Medications:  Prescriptions prior to admission  Medication Sig Dispense Refill  . acetaminophen (TYLENOL) 325 MG tablet Place 650 mg into feeding tube every 4 (four) hours as needed for pain or fever.      Marland Kitchen albuterol (PROVENTIL) (2.5 MG/3ML) 0.083% nebulizer solution Take 2.5 mg by nebulization every 4 (four) hours as needed for wheezing.      Marland Kitchen antiseptic oral rinse (BIOTENE) LIQD 15 mLs by Mouth Rinse route 2 (two) times daily.      . cholecalciferol (VITAMIN D) 1000 UNITS tablet Place 2,000 Units into feeding tube  daily.      . cyanocobalamin 500 MCG tablet Place 500 mcg into feeding tube daily.      Marland Kitchen docusate (COLACE) 50 MG/5ML liquid Take 100 mg by mouth 2 (two) times daily as needed (for constipation).      . feeding supplement (PRO-STAT SUGAR FREE 64) LIQD Take 30 mLs by mouth daily.      . furosemide (LASIX) 40 MG tablet Place 40 mg into feeding tube daily.      . heparin 5000 UNIT/ML injection Inject 5,000 Units into the skin every 8 (eight) hours.      . lansoprazole (PREVACID) 30 MG capsule Take 30 mg by mouth daily.  Dissolve contents of 1 capsule in 10ml of water or juice      . levETIRAcetam (KEPPRA) 100 MG/ML solution Take 500 mg by mouth 2 (two) times daily.      Marland Kitchen levothyroxine (SYNTHROID, LEVOTHROID) 88 MCG tablet Place 88 mcg into feeding tube daily before breakfast.      . memantine (NAMENDA) 5 MG tablet Place 5 mg into feeding tube 2 (two) times daily.       . Nutritional Supplements (FEEDING SUPPLEMENT, JEVITY 1.5 CAL,) LIQD Place 1,000 mLs into feeding tube continuous. 65 ml/hr; on at 6am off at 2am    0  . polyethylene glycol (MIRALAX / GLYCOLAX) packet Take 17 g by mouth 2 (two) times daily as needed (for constipation).      . pravastatin (PRAVACHOL) 40 MG tablet Place 40 mg into feeding tube daily.      . Water For Irrigation, Sterile (FREE WATER) SOLN Place 100 mLs into feeding tube every 8 (eight) hours.       Assessment: AMS and hypoxia. R heel hematoma 79 y/o F with recent CVA (7/14), R hemiplegia and aphagia with PEG placement and chronic foley. Trached 11/21/12  Anticoagulation: SQ heparin/8h  Infectious Disease: Tmax 99. WBC 7.6. Scr 0.38 with CrCl 52 to start IV Vanco for MRSA around PEG tube site. Vanco 8/30>>  Cardiovascular: HLD,CHF, VSS on Zocor  Endocrinology: hypothyroid on levothyroxine, SSI with CBGs 74-212  Gastrointestinal / Nutrition: PCM with PEG, Vit D, B12, Jevity, Prostat, MV, tube PPI  Neurology: h/o CVA, dementia, on [place]  Nephrology: Scr 0.39  with CrCl 52  Pulmonary: Trached  Hematology / Oncology: anemia  PTA Medication Issues: Lasix, Keppra,   Best Practices: Mouth care, SQ heparin, PPI  Goal of Therapy:  Vancomycin trough level 10-15 mcg/ml  Plan:  Vancomycin 1250mg  IV q24h.  Trough after 3-5 doses at steady state.   Kip Kautzman S. Merilynn Finland, PharmD, BCPS Clinical Staff Pharmacist Pager [phone]  Misty Stanley Stillinger 12/07/2012,12:55 PM

## 2012-12-08 LAB — CBC WITH DIFFERENTIAL/PLATELET
Eosinophils Absolute: 0.2 10*3/uL (ref 0.0–0.7)
Lymphocytes Relative: 17 % (ref 12–46)
Lymphs Abs: 1.1 10*3/uL (ref 0.7–4.0)
Neutrophils Relative %: 74 % (ref 43–77)
Platelets: 388 10*3/uL (ref 150–400)
RBC: 2.79 MIL/uL — ABNORMAL LOW (ref 3.87–5.11)
WBC: 6.7 10*3/uL (ref 4.0–10.5)

## 2012-12-08 LAB — GLUCOSE, CAPILLARY
Glucose-Capillary: 106 mg/dL — ABNORMAL HIGH (ref 70–99)
Glucose-Capillary: 112 mg/dL — ABNORMAL HIGH (ref 70–99)
Glucose-Capillary: 132 mg/dL — ABNORMAL HIGH (ref 70–99)
Glucose-Capillary: 138 mg/dL — ABNORMAL HIGH (ref 70–99)
Glucose-Capillary: 140 mg/dL — ABNORMAL HIGH (ref 70–99)

## 2012-12-08 LAB — BASIC METABOLIC PANEL
Calcium: 8.2 mg/dL — ABNORMAL LOW (ref 8.4–10.5)
GFR calc non Af Amer: >90 mL/min (ref >90)
Sodium: 139 mEq/L (ref 135–145)

## 2012-12-08 NOTE — Progress Notes (Signed)
PULMONARY  / CRITICAL CARE MEDICINE  Name: Jenny Brady MRN: [id number] DOB: 1925-07-12    ADMISSION DATE:  11/11/2012 CONSULTATION DATE:  11/11/2012  REFERRING MD :  EDP PRIMARY SERVICE:  PCCM  CHIEF COMPLAINT:  Acute respiratory failure  BRIEF PATIENT DESCRIPTION: 79 y/o with past medical history of recent CVA with resulting R hemiplegia and aphagia, s/p PEG placement and chronic Foley cath brought to ED with altered mental status and hypoxia.  In ED intubated for airway protection / hypoxemia.  Large amount of pooled secretions noted obstructing oropharynx during intubation. R heel hematoma debrided by EDP. Metabolic disarray.  PCCM was consulted for admit.   SIGNIFICANT EVENTS / STUDIES:  8/04 - Admit, acute on chronic resp fx in setting of CVA 8/07 - Replaced malpositioned PEG  8/12 - extubated, reintubnated in 30 min , severe cord edema 8/13 - weaning in chair 8/15 - trach 8/25 - mod secretions, pending bed [facility] 8/28- concern peg fxn 8/28 CT abdo-Gastrostomy tube has been partially withdrawn with the balloon now<BR>located within the subcutaneous soft tissues external to the<BR>stomach 8/29- peg replaced (again)  LINES / TUBES: ETT 8/4 >>>8/12, 8/12 (stridor) >> 8/14  G tube PTA >> 8/7 Trach 8/14 (DF) >>  CULTURES: 8/4 Blood >>>Neg 8/4 Urine >>> negative Urine 8/17 >>  00 K YEAST Resp  8/17 >  MRSA, vanc sens  Blood 8/17 >>  neg   PEG site wound 8/30 MRSA  ANTIBIOTICS: Zosyn 8/4 >>>off Vancomycin 8/4 >  8/8 Vanc 8/16 >>8/25 Vanc 8/31>> Ceftaz 8/16 >  8./8  INTERVAL HISTORY:  Nurse reports a little sleepier today.   VITAL SIGNS: Temp:  [97.6 F (36.4 C)-98.9 F (37.2 C)] 97.6 F (36.4 C) (08/31 0352) Pulse Rate:  [76-102] 92 (08/31 0835) Resp:  [14-22] 15 (08/31 0835) BP: (96-117)/(46-66) 100/47 mmHg (08/31 0835) SpO2:  [96 %-100 %] 100 % (08/31 0835) FiO2 (%):  [28 %] 28 % (08/31 0835) Weight:  [76 kg (167 lb 8.8 oz)] 76 kg (167 lb 8.8 oz)  (08/31 0352)  VENTILATOR SETTINGS: Vent Mode:  [-]  FiO2 (%):  [28 %] 28 %  INTAKE / OUTPUT: Intake/Output     08/30 0701 - 08/31 0700 08/31 0701 - 09/01 0700   I.V. (mL/kg) 10 (0.1)    Other 20    NG/GT     IV Piggyback 120    Total Intake(mL/kg) 150 (2)    Urine (mL/kg/hr) 950 (0.5) 500 (1.7)   Total Output 950 500   Net -800 -500        Stool Occurrence 2 x      PHYSICAL EXAMINATION: General:  No distress Neuro:  Chronic right sided hemiplegia, aphasic Eyes: She favors left eye closed and resists exam, see prior exams Neck : Trach site clean, no secretions at all this am. ATC Cardiovascular:  RRR s M Lungs: CTA anterior Abdomen:  Soft, nontender, bowel sounds wnl , PEG site minor redness Musculoskeletal:  No edema Skin:  Debrided hematoma R heel w/ dsg, sacral ulcer stg 1-2   LABS: PULMONARY  CBC  Recent Labs Lab 12/03/12 0530 12/08/12 0500  HGB 8.1* 8.3*  HCT 25.2* 26.2*  WBC 7.6 6.7  PLT 303 388   CHEMISTRY  Recent Labs Lab 12/03/12 0530 12/04/12 0420 12/05/12 0610 12/07/12 0450 12/08/12 0500  NA 137 139 139 138 139  K 3.8 3.3* 4.1 3.6 3.6  CL 102 102 104 102 104  CO2 28 30 29  27  28  GLUCOSE 145* 154* 146* 154* 154*  BUN 17 19 15 18 16   CREATININE 0.33* 0.36* 0.35* 0.39* 0.36*  CALCIUM 8.2* 8.4 8.2* 8.3* 8.2*  MG  --  1.7 2.3  --   --   PHOS  --  3.3  --   --   --    Estimated Creatinine Clearance: 52.8 ml/min (by C-G formula based on Cr of 0.36).  ENDOCRINE CBG (last 3)   Recent Labs  12/08/12 0007 12/08/12 0354 12/08/12 0738  GLUCAP 132* 140* 138*    No results found.  ASSESSMENT / PLAN:  PULMONARY A:   Acute respiratory failure -aspiration 8/12 stridor, cord edema-->8/14 trach -This should be a LIFE long trach MRSA tracheobronchitis  chf component P:   - Cont ATC as tolerated - suctioning improved -allow continued neg balance, as renal tolerates -PMV per RT/ SLP  CARDIOVASCULAR A:  Hyperlipidemia  P:  -  simvastatin follow BP as neg balance continued  RENAL A Hypokalemia, hypomag P:   - h/o chf, allow neg balance -goal 500 cc daily neg 8/31- BMET for tomorrow   GASTROINTESTINAL A:   Malnutrition.   Dysphagia - S/p PEG Malfunctioning PEG - replaced 8/7 and 8/28 P:   - Cont Acid SUP Npo, consider start Tf again today  HEMATOLOGIC   A: Mild anemia P:  - avoid phlebotomy when able  INFECTIOUS A:   Fever - low grade, tmax 99, WBC wnl MRSA tracheobronchitis MRSA PEG site 8/30 phone report P:   -CT findings around balloon unlikely abscess as was balloon on exam in skin -Vanco resumed 8/30  ENDOCRINE  A:  Hyperglycemia resolved Hypothyroidism   P:   -Levothyroxine  NEUROLOGIC A:   H/o CVA, dementia Profound debilitation P:   -will re call back wound care - they recommend same treatment to continue, no role debridement escar -optho to see eye left -avoid benzo  DISPO [facility] sept 1  CD Lakrista Scaduto, MD PCCM m [phone]

## 2012-12-09 DIAGNOSIS — R413 Other amnesia: Secondary | ICD-10-CM

## 2012-12-09 LAB — GLUCOSE, CAPILLARY
Glucose-Capillary: 136 mg/dL — ABNORMAL HIGH (ref 70–99)
Glucose-Capillary: 122 mg/dL — ABNORMAL HIGH (ref 70–99)
Glucose-Capillary: 111 mg/dL — ABNORMAL HIGH (ref 70–99)

## 2012-12-09 LAB — BASIC METABOLIC PANEL
BUN: 15 mg/dL (ref 6–23)
Calcium: 8.3 mg/dL — ABNORMAL LOW (ref 8.4–10.5)
Creatinine, Ser: 0.34 mg/dL — ABNORMAL LOW (ref 0.50–1.10)
GFR calc Af Amer: >90 mL/min (ref >90)
GFR calc non Af Amer: >90 mL/min (ref >90)

## 2012-12-09 MED ORDER — POTASSIUM CHLORIDE 20 MEQ/15ML (10%) PO LIQD
40.0000 meq | Freq: Once | ORAL | Status: AC
  Administered 2012-12-09: 40 meq
  Filled 2012-12-09: qty 30

## 2012-12-09 NOTE — Progress Notes (Signed)
PULMONARY  / CRITICAL CARE MEDICINE  Name: Jenny Brady MRN: [id number] DOB: 1925/05/24    ADMISSION DATE:  11/11/2012 CONSULTATION DATE:  11/11/2012  REFERRING MD :  EDP PRIMARY SERVICE:  PCCM  CHIEF COMPLAINT:  Acute respiratory failure  BRIEF PATIENT DESCRIPTION: 79 y/o with past medical history of recent CVA with resulting R hemiplegia and aphagia, s/p PEG placement and chronic Foley cath brought to ED with altered mental status and hypoxia.  In ED intubated for airway protection / hypoxemia.  Large amount of pooled secretions noted obstructing oropharynx during intubation. R heel hematoma debrided by EDP. Metabolic disarray.  PCCM was consulted for admit.   SIGNIFICANT EVENTS / STUDIES:  8/04 - Admit, acute on chronic resp fx in setting of CVA 8/07 - Replaced malpositioned PEG  8/12 - extubated, reintubnated in 30 min , severe cord edema 8/13 - weaning in chair 8/15 - trach 8/25 - mod secretions, pending bed placement at [facility] 8/28- concern peg fxn 8/28 CT abdo-Gastrostomy tube has been partially withdrawn with the balloon now<BR>located within the subcutaneous soft tissues external to the<BR>stomach 8/29- peg replaced (again)  LINES / TUBES: ETT 8/4 >>>8/12, 8/12 (stridor) >> 8/14  G tube PTA >> 8/7 Trach 8/14 (DF) >>  CULTURES: 8/4 Blood >>>Neg 8/4 Urine >>> negative Urine 8/17 >>  00 K YEAST Resp  8/17 >  MRSA, vanc sens  Blood 8/17 >>  neg   PEG site wound 8/30 MRSA  ANTIBIOTICS: Zosyn 8/4 >>>off Vancomycin 8/4 >  8/8 Vanc 8/16 >>8/25 Vanc 8/31>> Ceftaz 8/16 >  8./8  INTERVAL HISTORY:  Nurse reports a little sleepier today.   VITAL SIGNS: Temp:  [97.7 F (36.5 C)-98.8 F (37.1 C)] 98.7 F (37.1 C) (09/01 0759) Pulse Rate:  [81-110] 91 (09/01 0759) Resp:  [16-23] 20 (09/01 0759) BP: (88-113)/(41-62) 89/46 mmHg (09/01 0759) SpO2:  [97 %-100 %] 97 % (09/01 0759) FiO2 (%):  [28 %] 28 % (09/01 0759) Weight:  [75.3 kg (166 lb 0.1 oz)] 75.3 kg (166 lb 0.1 oz)  (09/01 0350)  VENTILATOR SETTINGS: Vent Mode:  [-]  FiO2 (%):  [28 %] 28 %  INTAKE / OUTPUT: Intake/Output     08/31 0701 - 09/01 0700 09/01 0701 - 09/02 0700   I.V. (mL/kg)  10 (0.1)   Other     NG/GT 550    IV Piggyback     Total Intake(mL/kg) 550 (7.3) 10 (0.1)   Urine (mL/kg/hr) 800 (0.4)    Total Output 800     Net -250 +10        Stool Occurrence 2 x      PHYSICAL EXAMINATION: General:  No distress Neuro:  Chronic right sided hemiplegia, aphasic Eyes: She favors left eye closed and resists exam, see prior exams Neck : Trach site clean, no secretions at all this am. ATC Cardiovascular:  RRR s M Lungs: CTA anterior Abdomen:  Soft, nontender, bowel sounds wnl , PEG site minor redness Musculoskeletal:  No edema Skin:  Debrided hematoma R heel w/ dsg, sacral ulcer stg 1-2   LABS: PULMONARY  CBC  Recent Labs Lab 12/03/12 0530 12/08/12 0500  HGB 8.1* 8.3*  HCT 25.2* 26.2*  WBC 7.6 6.7  PLT 303 388   CHEMISTRY  Recent Labs Lab 12/04/12 0420 12/05/12 0610 12/07/12 0450 12/08/12 0500 12/09/12 0435  NA 139 139 138 139 140  K 3.3* 4.1 3.6 3.6 3.4*  CL 102 104 102 104 105  CO2 30 29 27  28 28  GLUCOSE 154* 146* 154* 154* 133*  BUN 19 15 18 16 15   CREATININE 0.36* 0.35* 0.39* 0.36* 0.34*  CALCIUM 8.4 8.2* 8.3* 8.2* 8.3*  MG 1.7 2.3  --   --   --   PHOS 3.3  --   --   --   --    Estimated Creatinine Clearance: 52.8 ml/min (by C-G formula based on Cr of 0.34).  ENDOCRINE CBG (last 3)   Recent Labs  12/09/12 0023 12/09/12 0352 12/09/12 0754  GLUCAP 127* 138* 136*    No results found.  ASSESSMENT / PLAN:  PULMONARY A:   Acute respiratory failure -aspiration 8/12 stridor, cord edema-->8/14 trach -This should be a LIFE long trach MRSA tracheobronchitis  chf component P:   - Cont ATC as tolerated - suctioning improved -allow continued neg balance, as renal tolerates -PMV per RT/ SLP  CARDIOVASCULAR A:  Hyperlipidemia  P:  -  simvastatin follow BP as neg balance continued  RENAL A Hypokalemia, hypomag P:   - h/o chf, allow neg balance -goal 500 cc daily neg 8/31- BMET for tomorrow   GASTROINTESTINAL A:   Malnutrition.   Dysphagia - S/p PEG Malfunctioning PEG - replaced 8/7 and 8/28 P:   - Cont Acid SUP Npo, consider start Tf again today  HEMATOLOGIC   A: Mild anemia P:  - avoid phlebotomy when able  INFECTIOUS A:   Fever - low grade, tmax 99, WBC wnl MRSA tracheobronchitis MRSA PEG site 8/30 phone report P:   -CT findings around balloon unlikely abscess as was balloon on exam in skin -Vanco resumed 8/30  ENDOCRINE  A:  Hyperglycemia resolved Hypothyroidism   P:   -Levothyroxine  NEUROLOGIC A:   H/o CVA, dementia Profound debilitation P:   -will re call back wound care - they recommend same treatment to continue, no role debridement escar -optho to see eye left -avoid benzo  DISPO [facility] sept 2   Patrice Matthew M. Kriste Basque, MD PCCM 12/09/12 @ 9:30AM

## 2012-12-09 NOTE — Progress Notes (Signed)
Clinical Social Worker continuing to follow patient and family for support and discharge planning needs. Will update when new information arises  Maree Krabbe, MSW, [place] [phone]

## 2012-12-10 LAB — GLUCOSE, CAPILLARY: Glucose-Capillary: 127 mg/dL — ABNORMAL HIGH (ref 70–99)

## 2012-12-10 MED ORDER — DOXYCYCLINE HYCLATE 100 MG PO TABS
100.0000 mg | ORAL_TABLET | Freq: Two times a day (BID) | ORAL | Status: DC
  Administered 2012-12-10 – 2012-12-12 (×5): 100 mg via ORAL
  Filled 2012-12-10 (×6): qty 1

## 2012-12-10 MED ORDER — ENOXAPARIN SODIUM 30 MG/0.3ML ~~LOC~~ SOLN
30.0000 mg | SUBCUTANEOUS | Status: DC
  Administered 2012-12-10 – 2012-12-12 (×3): 30 mg via SUBCUTANEOUS
  Filled 2012-12-10 (×3): qty 0.3

## 2012-12-10 NOTE — Progress Notes (Signed)
PULMONARY  / CRITICAL CARE MEDICINE  Name: Jenny Brady MRN: [id number] DOB: 1925/05/17    ADMISSION DATE:  11/11/2012 CONSULTATION DATE:  11/11/2012  REFERRING MD :  EDP PRIMARY SERVICE:  PCCM  CHIEF COMPLAINT:  Acute respiratory failure  BRIEF PATIENT DESCRIPTION: 79 y/o with past medical history of recent CVA with resulting R hemiplegia and aphagia, s/p PEG placement and chronic Foley cath brought to ED with altered mental status and hypoxia.  In ED intubated for airway protection / hypoxemia.  Large amount of pooled secretions noted obstructing oropharynx during intubation. R heel hematoma debrided by EDP. Metabolic disarray.  PCCM was consulted for admit.   SIGNIFICANT EVENTS / STUDIES:  8/04 - Admit, acute on chronic resp fx in setting of CVA 8/07 - Replaced malpositioned PEG  8/12 - extubated, reintubnated in 30 min , severe cord edema 8/13 - weaning in chair 8/15 - trach 8/25 - mod secretions, pending bed placement at [facility] 8/28- concern peg fxn 8/28 CT abdo-Gastrostomy tube has been partially withdrawn with the balloon now<BR>located within the subcutaneous soft tissues external to the<BR>stomach 8/29- peg replaced (again)  LINES / TUBES: ETT 8/4 >>>8/12, 8/12 (stridor) >> 8/14  G tube PTA >> 8/7 Trach 8/14 (DF) >>  CULTURES: 8/4 Blood >>>Neg 8/4 Urine >>> negative Urine 8/17 >>  00 K YEAST Resp  8/17 >  MRSA, vanc sens  Blood 8/17 >>  neg   PEG site wound 8/30 MRSA  ANTIBIOTICS: Zosyn 8/4 >>>off Vancomycin 8/4 >  8/8 Vanc 8/16 >>8/25 Vanc 8/31>> 9/02 Ceftaz 8/16 >  8/8 Doxy 9/02 >> (14 days planned)  INTERVAL HISTORY:  No new issues. Awaiting LTACH transfer   VITAL SIGNS: Temp:  [97.6 F (36.4 C)-99.3 F (37.4 C)] 99.3 F (37.4 C) (09/02 0736) Pulse Rate:  [84-110] 94 (09/02 0736) Resp:  [16-23] 20 (09/02 0736) BP: (82-110)/(33-60) 104/52 mmHg (09/02 0736) SpO2:  [94 %-100 %] 98 % (09/02 0736) FiO2 (%):  [28 %] 28 % (09/02 0736) Weight:  [75.3 kg (166  lb 0.1 oz)] 75.3 kg (166 lb 0.1 oz) (09/02 0400)  VENTILATOR SETTINGS: Vent Mode:  [-]  FiO2 (%):  [28 %] 28 %  INTAKE / OUTPUT: Intake/Output     09/01 0701 - 09/02 0700 09/02 0701 - 09/03 0700   I.V. (mL/kg) 10 (0.1)    NG/GT 984.2    IV Piggyback 250    Total Intake(mL/kg) 1244.2 (16.5)    Urine (mL/kg/hr) 1800 (1) 175 (0.9)   Total Output 1800 175   Net -555.8 -175        Stool Occurrence 4 x      PHYSICAL EXAMINATION: General:  No distress on TC Neuro:  Chronic right sided hemiplegia, aphasic Neck : Trach site clean, no secretions at all this am.  Cardiovascular:  RRR s M Lungs: CTA anterior Abdomen:  Soft, nontender, bowel sounds wnl , PEG site minor redness Musculoskeletal:  No edema  LABS: PULMONARY  CBC  Recent Labs Lab 12/08/12 0500  HGB 8.3*  HCT 26.2*  WBC 6.7  PLT 388   CHEMISTRY  Recent Labs Lab 12/04/12 0420 12/05/12 0610 12/07/12 0450 12/08/12 0500 12/09/12 0435  NA 139 139 138 139 140  K 3.3* 4.1 3.6 3.6 3.4*  CL 102 104 102 104 105  CO2 30 29 27 28 28   GLUCOSE 154* 146* 154* 154* 133*  BUN 19 15 18 16 15   CREATININE 0.36* 0.35* 0.39* 0.36* 0.34*  CALCIUM 8.4 8.2* 8.3*  8.2* 8.3*  MG 1.7 2.3  --   --   --   PHOS 3.3  --   --   --   --    Estimated Creatinine Clearance: 52.8 ml/min (by C-G formula based on Cr of 0.34).  ENDOCRINE CBG (last 3)   Recent Labs  12/10/12 0020 12/10/12 0439 12/10/12 0739  GLUCAP 126* 133* 127*    No results found.  ASSESSMENT / PLAN:  PULMONARY A:   Acute, now chronic respiratory failure -aspiration 8/12 stridor, cord edema-->8/14 trach Tracheostomy dependence MRSA tracheobronchitis  CHF component P:   - Cont ATC as tolerated -PMV per RT/ SLP  CARDIOVASCULAR A:  Hyperlipidemia  - there is no real benefit to be gained from statins in this setting P:  D/C simvastatin  RENAL A Hypokalemia P:   Monitor BMET intermittently Correct electrolytes as indicated  GASTROINTESTINAL A:    Malnutrition.   Dysphagia - S/p PEG Malfunctioning PEG - replaced 8/7 and 8/28 P:   Cont SUP Cont TFs  HEMATOLOGIC   A: Mild anemia P:  CBC imtermittently  INFECTIOUS A:   Fever, resolved MRSA tracheobronchitis MRSA PEG site infection Neither of these are invasive or life-threatening P:   D/C Vanc Doxy per tube X 14 days  ENDOCRINE  A:  Hyperglycemia resolved Hypothyroidism   P:   D/C SSI Cont Levothyroxine  NEUROLOGIC A:   H/o CVA, dementia Profound debilitation P:   LTACH transfer planned    Billy Fischer, MD ; [facility] service Mobile [phone].  After 5:30 PM or weekends, call [phone]

## 2012-12-10 NOTE — Progress Notes (Signed)
ANTIBIOTIC CONSULT NOTE - Follow-Up  Pharmacy Consult for Vancomycin Indication: MRSA PEG tube site abscess  Allergies  Allergen Reactions  . Hyoscyamine Other (See Comments)    Per MAR  . Ivp Dye [Iodinated Diagnostic Agents] Other (See Comments)    Per MAR  . Septra [Sulfamethoxazole W-Trimethoprim] Other (See Comments)    Per [facility]    Patient Measurements: Height: 5' 8.5" (174 cm) Weight: 166 lb 0.1 oz (75.3 kg) IBW/kg (Calculated) : 65.05 Adjusted Body Weight:   Vital Signs: Temp: 99.3 F (37.4 C) (09/02 0736) Temp src: Axillary (09/02 0736) BP: 104/52 mmHg (09/02 0736) Pulse Rate: 94 (09/02 0736) Intake/Output from previous day: 09/01 0701 - 09/02 0700 In: 1244.2 [I.V.:10; NG/GT:984.2; IV Piggyback:250] Out: 1800 [Urine:1800] Intake/Output from this shift: Total I/O In: -  Out: 175 [Urine:175]  Labs:  Recent Labs  12/08/12 0500 12/09/12 0435  WBC 6.7  --   HGB 8.3*  --   PLT 388  --   CREATININE 0.36* 0.34*   Estimated Creatinine Clearance: 52.8 ml/min (by C-G formula based on Cr of 0.34). No results found for this basename: VANCOTROUGH, VANCOPEAK, VANCORANDOM, GENTTROUGH, GENTPEAK, GENTRANDOM, TOBRATROUGH, TOBRAPEAK, TOBRARND, AMIKACINPEAK, AMIKACINTROU, AMIKACIN,  in the last 72 hours   Microbiology: Recent Results (from the past 720 hour(s))  CULTURE, BLOOD (ROUTINE X 2)     Status: None   Collection Time    11/11/12 10:10 PM      Result Value Range Status   Specimen Description BLOOD RIGHT WRIST   Final   Special Requests BOTTLES DRAWN AEROBIC ONLY 5CC   Final   Culture  Setup Time     Final   Value: 11/12/2012 05:56     Performed at [facility]   Culture     Final   Value: NO GROWTH 5 DAYS     Performed at [facility]   Report Status 11/18/2012 FINAL   Final  CULTURE, BLOOD (ROUTINE X 2)     Status: None   Collection Time    11/11/12 10:10 PM      Result Value Range Status   Specimen Description BLOOD HAND LEFT   Final   Special Requests BOTTLES DRAWN AEROBIC ONLY 5CC   Final   Culture  Setup Time     Final   Value: 11/12/2012 05:56     Performed at [facility]   Culture     Final   Value: NO GROWTH 5 DAYS     Performed at [facility]   Report Status 11/18/2012 FINAL   Final  URINE CULTURE     Status: None   Collection Time    11/11/12 10:34 PM      Result Value Range Status   Specimen Description URINE, CATHETERIZED   Final   Special Requests CX ADDED AT 2300 ON [id number]   Final   Culture  Setup Time     Final   Value: 11/12/2012 05:57     Performed at [facility]   Colony Count     Final   Value: NO GROWTH     Performed at [facility]   Culture     Final   Value: NO GROWTH     Performed at [facility]   Report Status 11/13/2012 FINAL   Final  MRSA PCR SCREENING     Status: None   Collection Time    11/12/12  1:43 AM      Result Value  Range Status   MRSA by PCR NEGATIVE  NEGATIVE Final   Comment:            The GeneXpert MRSA Assay (FDA     approved for NASAL specimens     only), is one component of a     comprehensive MRSA colonization     surveillance program. It is not     intended to diagnose MRSA     infection nor to guide or     monitor treatment for     MRSA infections.  URINE CULTURE     Status: None   Collection Time    11/12/12  1:43 AM      Result Value Range Status   Specimen Description URINE, CATHETERIZED   Final   Special Requests CX ADDED AT 0204 ON [id number]   Final   Culture  Setup Time     Final   Value: 11/12/2012 02:19     Performed at [facility]   Colony Count     Final   Value: NO GROWTH     Performed at [facility]   Culture     Final   Value: NO GROWTH     Performed at [facility]   Report Status 11/13/2012 FINAL   Final  URINE CULTURE     Status: None   Collection Time    11/24/12 12:22 AM      Result Value Range Status   Specimen Description URINE, CATHETERIZED   Final   Special  Requests CX ADDED AT 0045 ON [id number]   Final   Culture  Setup Time     Final   Value: 11/24/2012 05:49     Performed at [facility]   Colony Count     Final   Value: >=100,000 COLONIES/ML     Performed at [facility]   Culture     Final   Value: YEAST     Performed at [facility]   Report Status 11/25/2012 FINAL   Final  CULTURE, BLOOD (ROUTINE X 2)     Status: None   Collection Time    11/24/12  1:45 AM      Result Value Range Status   Specimen Description BLOOD RIGHT HAND   Final   Special Requests BOTTLES DRAWN AEROBIC ONLY 1.5CC   Final   Culture  Setup Time     Final   Value: 11/24/2012 12:10     Performed at [facility]   Culture     Final   Value: NO GROWTH 5 DAYS     Performed at [facility]   Report Status 11/30/2012 FINAL   Final  CULTURE, RESPIRATORY (NON-EXPECTORATED)     Status: None   Collection Time    11/24/12 12:35 PM      Result Value Range Status   Specimen Description TRACHEAL ASPIRATE   Final   Special Requests Normal   Final   Gram Stain     Final   Value: ABUNDANT WBC PRESENT, PREDOMINANTLY PMN     NO SQUAMOUS EPITHELIAL CELLS SEEN     RARE GRAM POSITIVE COCCI IN PAIRS     Performed at [facility]   Culture     Final   Value: MODERATE METHICILLIN RESISTANT STAPHYLOCOCCUS AUREUS     Note: RIFAMPIN AND GENTAMICIN SHOULD NOT BE USED AS SINGLE DRUGS FOR TREATMENT OF STAPH INFECTIONS. CRITICAL RESULT CALLED TO, READ BACK BY AND  VERIFIED WITH: ANITA M. @1230  ON 8.19.14 BY BUONO     Performed at [facility]   Report Status 11/26/2012 FINAL   Final   Organism ID, Bacteria METHICILLIN RESISTANT STAPHYLOCOCCUS AUREUS   Final  CULTURE, ROUTINE-ABSCESS     Status: None   Collection Time    12/04/12  6:32 AM      Result Value Range Status   Specimen Description ABSCESS   Final   Special Requests PEG TUBE INSERTION SITE   Final   Gram Stain     Final   Value: NO WBC SEEN     NO SQUAMOUS EPITHELIAL  CELLS SEEN     RARE GRAM POSITIVE RODS     Performed at [facility]   Culture     Final   Value: MODERATE METHICILLIN RESISTANT STAPHYLOCOCCUS AUREUS     Note: RIFAMPIN AND GENTAMICIN SHOULD NOT BE USED AS SINGLE DRUGS FOR TREATMENT OF STAPH INFECTIONS. This organism DOES NOT demonstrate inducible Clindamycin resistance in vitro. CRITICAL RESULT CALLED TO, READ BACK BY AND VERIFIED WITH: BOBBIE 8/30 @ 950      BY REAMM     Performed at [facility]   Report Status 12/07/2012 FINAL   Final   Organism ID, Bacteria METHICILLIN RESISTANT STAPHYLOCOCCUS AUREUS   Final    Medical History: Past Medical History  Diagnosis Date  . Hyperthyroidism     had RAI-is on thyroid replacement, happened maybe this summer  . Coronary artery disease ?, before 2005    Had a catheterization per son by Dr. [name] in 1991 no reports in  [place].  note from 2005 mentions hx of coronary stent  . GERD (gastroesophageal reflux disease)   . Stroke 07/2012    Right hemiplegia, dysphagia, aphasia.  . Dementia   . Aphasia 07/2012  . Chronic systolic heart failure     07/2012 the EF was 15%  . Mural thrombus of cardiac apex   . Obesity   . IBS (irritable bowel syndrome)   . DVT (deep venous thrombosis) before 2005    occurred after trauma, treated for a while with Coumadin.   Marland Kitchen Dysphagia due to recent stroke 07/2012    ok'd for purree/pudding thick liquids but G tube placed by IR  . OSA (obstructive sleep apnea)     refused CPAP per notes.     Medications:  . antiseptic oral rinse  1 application Mouth Rinse QID  . artificial tears   Both Eyes QHS  . chlorhexidine  15 mL Mouth/Throat BID  . cholecalciferol  2,000 Units Oral Daily  . cyanocobalamin  500 mcg Per Tube Daily  . feeding supplement (JEVITY 1.2 CAL)  1,000 mL Per Tube Q24H  . feeding supplement  30 mL Per Tube BID  . heparin subcutaneous  5,000 Units Subcutaneous Q8H  . insulin aspart  0-24 Units Subcutaneous Q4H  .  levothyroxine  88 mcg Per Tube QAC breakfast  . memantine  5 mg Per Tube BID  . multivitamin with minerals  1 tablet Per Tube Daily  . pantoprazole sodium  40 mg Per Tube Daily  . polyvinyl alcohol  1 drop Both Eyes QID  . prednisoLONE acetate  1 drop Left Eye QID  . scopolamine  1 patch Transdermal Q72H  . simvastatin  20 mg Oral q1800  . sodium chloride  10-40 mL Intracatheter Q12H  . vancomycin  1,250 mg Intravenous Q24H   Assessment:  79 yo female with MRSA  tracheobronchitis started on vancomycin 8/30.  Growing MRSA in trach aspirate and PEG site abscess.  Both cultures sensitive to vancomycin (MIC = 1).  Will increase vancomycin trough goal to 15-20 for better abscess penetration.  Goal of Therapy:  Vancomycin trough 15-20  Plan:  1. Continue vancomycin at current dosage of 1250 mg IV q 24 hrs. 2. Will check a vancomycin trough tomorrow. 3. Continue to monitor renal function and clinical course.  Tad Moore, BCPS  Clinical Pharmacist Pager [phone]  12/10/2012 9:38 AM

## 2012-12-11 DIAGNOSIS — J40 Bronchitis, not specified as acute or chronic: Secondary | ICD-10-CM

## 2012-12-11 LAB — GLUCOSE, CAPILLARY: Glucose-Capillary: 139 mg/dL — ABNORMAL HIGH (ref 70–99)

## 2012-12-11 NOTE — Progress Notes (Signed)
[facility] Speaking Valve - Treatment Patient Details  Name: Jenny Brady MRN: [id number] Date of Birth: 1925-10-13  Today's Date: 12/11/2012 Time: 1100-1136 SLP Time Calculation (min): 36 min  Past Medical History:  Past Medical History  Diagnosis Date  . Hyperthyroidism     had RAI-is on thyroid replacement, happened maybe this summer  . Coronary artery disease ?, before 2005    Had a catheterization per son by Dr. [name] in 1991 no reports in  [place].  note from 2005 mentions hx of coronary stent  . GERD (gastroesophageal reflux disease)   . Stroke 07/2012    Right hemiplegia, dysphagia, aphasia.  . Dementia   . Aphasia 07/2012  . Chronic systolic heart failure     07/2012 the EF was 15%  . Mural thrombus of cardiac apex   . Obesity   . IBS (irritable bowel syndrome)   . DVT (deep venous thrombosis) before 2005    occurred after trauma, treated for a while with Coumadin.   Marland Kitchen Dysphagia due to recent stroke 07/2012    ok'd for purree/pudding thick liquids but G tube placed by IR  . OSA (obstructive sleep apnea)     refused CPAP per notes.    Past Surgical History:  Past Surgical History  Procedure Laterality Date  . Tee without cardioversion N/A 07/26/2012    Procedure: TRANSESOPHAGEAL ECHOCARDIOGRAM (TEE);  Surgeon: Laurey Morale, MD;  Location: [facility] ENDOSCOPY;  Service: Cardiovascular;  Laterality: N/A;  . Gastric feeding tube  08/02/2012    placed in radiology    Assessment / Plan / Recommendation Clinical Impression  Pt wore the PMSV for 25 minutes with no overt signs of intolerance. VS remained stable, no evidence of CO2 retention, and no increased WOB was observed. Pt did have facial grimace prior to placement of PMSV which persisted through trial. Minimal vocalizations were heard, with two instances of clear phonation with adequate volume. RN/RT report that she has been requiring frequent deep suctioning (every hour), with no attempts at self-managing secretions  observed today. Per RN request, PMSV was doffed prior to SLP leaving. Continue plan of care.    Plan  Continue with current plan of care    Follow Up Recommendations  LTACH;Skilled Nursing facility;24 hour supervision/assistance    Pertinent Vitals/Pain N/A    SLP Goals Potential to Achieve Goals: Fair Potential Considerations: Ability to learn/carryover information;Previous level of function Progress/Goals/Alternative treatment plan discussed with pt/caregiver and they: No caregivers available;Patient unable to parrticipate in goal setting SLP Goal #1: Pt will tolerate PMSV during waking hours, except during breathing treatments with respiratory therapy SLP Goal #1 - Progress: Progressing toward goal   PMSV Trial  PMSV was placed for: 25 minutes Able to redirect subglottic air through upper airway: Yes Able to Attain Phonation: Yes Voice Quality: Normal Able to Expectorate Secretions: No Breath Support for Phonation: Other (comment) (difficult to assess given minimal phonation) Intelligibility: Unable to assess (comment) Respirations During Trial: 20 SpO2 During Trial: 98 % Pulse During Trial: 90 Behavior: Alert;Poor eye contact;Quiet;Other (comment) (not communicative)   Tracheostomy Tube  Additional Tracheostomy Tube Assessment Frequency of Tracheal Suctioning: hourly per RN and RT Level of Secretion Expectoration: Tracheal    Vent Dependency  Vent Dependent: No FiO2 (%): 28 %    Cuff Deflation Trial  GO     Tolerated Cuff Deflation:  (cuff already deflated at baseline) Behavior: Alert;Poor eye contact;Other (comment);Quiet (not communicative)   Maxcine Ham 12/11/2012, 11:37 AM  Vernona Rieger  [place]ky, M.A. CCC-SLP

## 2012-12-11 NOTE — Progress Notes (Signed)
PULMONARY  / CRITICAL CARE MEDICINE  Name: Jenny Brady MRN: [id number] DOB: 08/04/1925    ADMISSION DATE:  11/11/2012 CONSULTATION DATE:  11/11/2012  REFERRING MD :  EDP PRIMARY SERVICE:  PCCM  CHIEF COMPLAINT:  Acute respiratory failure  BRIEF PATIENT DESCRIPTION: 79 y/o with past medical history of recent CVA with resulting R hemiplegia and aphagia, s/p PEG placement and chronic Foley cath brought to ED with altered mental status and hypoxia.  In ED intubated for airway protection / hypoxemia.  Large amount of pooled secretions noted obstructing oropharynx during intubation. R heel hematoma debrided by EDP. Metabolic disarray.  PCCM was consulted for admit.   SIGNIFICANT EVENTS / STUDIES:  8/04 - Admit, acute on chronic resp fx in setting of CVA 8/07 - Replaced malpositioned PEG  8/12 - extubated, reintubnated in 30 min , severe cord edema 8/13 - weaning in chair 8/15 - trach 8/25 - mod secretions, pending bed placement at [facility] 8/28- concern peg fxn 8/28 CT abdo-Gastrostomy tube has been partially withdrawn with the balloon now<BR>located within the subcutaneous soft tissues external to the<BR>stomach 8/29- peg replaced (again)  LINES / TUBES: ETT 8/4 >>>8/12, 8/12 (stridor) >> 8/14  G tube PTA >> 8/7 Trach 8/14 (DF) >>  CULTURES: 8/4 Blood >>>Neg 8/4 Urine >>> negative Urine 8/17 >>  00 K YEAST Resp  8/17 >  MRSA, vanc sens  Blood 8/17 >>  neg   PEG site wound 8/30 MRSA  ANTIBIOTICS: Zosyn 8/4 >>>off Vancomycin 8/4 >  8/8 Vanc 8/16 >>8/25 Vanc 8/31>> 9/02 Ceftaz 8/16 >  8/8 Doxy 9/02 >> (14 days planned)  INTERVAL HISTORY:  NSC. No new issues   VITAL SIGNS: Temp:  [98.6 F (37 C)-99.9 F (37.7 C)] 98.8 F (37.1 C) (09/03 1107) Pulse Rate:  [88-124] 92 (09/03 1107) Resp:  [18-31] 21 (09/03 1107) BP: (89-123)/(42-73) 89/44 mmHg (09/03 1107) SpO2:  [95 %-99 %] 97 % (09/03 1107) FiO2 (%):  [28 %] 28 % (09/03 1107) Weight:  [75.3 kg (166 lb 0.1 oz)] 75.3 kg  (166 lb 0.1 oz) (09/03 0404)  VENTILATOR SETTINGS: Vent Mode:  [-]  FiO2 (%):  [28 %] 28 %  INTAKE / OUTPUT: Intake/Output     09/02 0701 - 09/03 0700 09/03 0701 - 09/04 0700   I.V. (mL/kg) 10 (0.1) 10 (0.1)   NG/GT 817.5 150   IV Piggyback     Total Intake(mL/kg) 827.5 (11) 160 (2.1)   Urine (mL/kg/hr) 1000 (0.6) 400 (0.7)   Total Output 1000 400   Net -172.5 -240        Stool Occurrence 4 x 1 x     PHYSICAL EXAMINATION: General:  No distress on TC Neuro:  Chronic right sided hemiplegia, aphasic Neck : Trach site clean, no secretions at all this am.  Cardiovascular:  RRR s M Lungs: CTA anterior Abdomen:  Soft, nontender, bowel sounds wnl , PEG site minor redness Musculoskeletal:  No edema  LABS: PULMONARY  CBC  Recent Labs Lab 12/08/12 0500  HGB 8.3*  HCT 26.2*  WBC 6.7  PLT 388   CHEMISTRY  Recent Labs Lab 12/05/12 0610 12/07/12 0450 12/08/12 0500 12/09/12 0435  NA 139 138 139 140  K 4.1 3.6 3.6 3.4*  CL 104 102 104 105  CO2 29 27 28 28   GLUCOSE 146* 154* 154* 133*  BUN 15 18 16 15   CREATININE 0.35* 0.39* 0.36* 0.34*  CALCIUM 8.2* 8.3* 8.2* 8.3*  MG 2.3  --   --   --  Estimated Creatinine Clearance: 52.8 ml/min (by C-G formula based on Cr of 0.34).  ENDOCRINE CBG (last 3)   Recent Labs  12/10/12 0439 12/10/12 0739 12/11/12 0740  GLUCAP 133* 127* 139*    No results found.  ASSESSMENT / PLAN:  PULMONARY A:   Acute, now chronic respiratory failure -aspiration 8/12 stridor, cord edema-->8/14 trach Tracheostomy dependence MRSA tracheobronchitis  CHF component P:   Cont ATC as tolerated PMV per RT/ SLP  CARDIOVASCULAR A:  Hyperlipidemia   P:  Monitoring off statins  RENAL A Hypokalemia P:   Monitor BMET intermittently Correct electrolytes as indicated  GASTROINTESTINAL A:   Malnutrition.   Dysphagia - S/p PEG Malfunctioning PEG - replaced 8/7 and 8/28 P:   Cont SUP Cont TFs  HEMATOLOGIC   A: Mild anemia P:   CBC imtermittently  INFECTIOUS A:   Fever, resolved MRSA tracheobronchitis MRSA PEG site infection Neither of these are invasive or life-threatening P:   D/C Vanc Complete doxy per tube X 14 days  ENDOCRINE  A:  Hyperglycemia resolved Hypothyroidism   P:   D/C SSI Cont Levothyroxine  NEUROLOGIC A:   H/o CVA, dementia Profound debilitation P:   LTACH transfer planned    Billy Fischer, MD ; [facility] service Mobile [phone].  After 5:30 PM or weekends, call [phone]

## 2012-12-11 NOTE — Progress Notes (Signed)
NUTRITION FOLLOW UP  Intervention:    Continue current EN regimen RD to follow for nutrition care plan  Nutrition Dx:   Inadequate oral intake related to inability to eat as evidenced by NPO status, ongoing  Goal:   EN to meet > 90% of estimated nutrition needs, met  Monitor:   EN regimen & tolerance, respiratory status, weight, labs, I/O's  Assessment: Pt with past medical history of recent CVA with resulting R hemiplegia and aphagia, s/p PEG placement (07/2012) and chronic Foley cath brought to ED with altered mental status and hypoxia; s/p trach placement 8/14.   Patient remains on trach collar. Jevity 1.2 formula infusing at 50 ml/hr via PEG tube with Prostat liquid protein 30 ml twice daily providing 1640 total kcals, 97 gm protein, 968 ml of free water.  MVI tablet via tube discontinued 9/2 per MD.  CWOCN note reviewed 8/28.  Noted new abscess to PEG site; positive for MRSA.  S/p Passy-Muir speaking valve evaluation completed 8/30.  Disposition: LTACH transfer.  Height: Ht Readings from Last 1 Encounters:  12/05/12 5' 8.5" (1.74 m)    Weight Status:   Wt Readings from Last 1 Encounters:  12/11/12 166 lb 0.1 oz (75.3 kg)    Re-estimated needs:  Kcal: 1500-1700 Protein: 90-100 gm Fluid: 1.5-1.7 L  Skin: Stage II pressure ulcer to sacrum, unstageable to R heel  Diet Order: NPO   Intake/Output Summary (Last 24 hours) at 12/11/12 1611 Last data filed at 12/11/12 1216  Gross per 24 hour  Intake  752.5 ml  Output    800 ml  Net  -47.5 ml    Labs:   Recent Labs Lab 12/05/12 0610 12/07/12 0450 12/08/12 0500 12/09/12 0435  NA 139 138 139 140  K 4.1 3.6 3.6 3.4*  CL 104 102 104 105  CO2 29 27 28 28   BUN 15 18 16 15   CREATININE 0.35* 0.39* 0.36* 0.34*  CALCIUM 8.2* 8.3* 8.2* 8.3*  MG 2.3  --   --   --   GLUCOSE 146* 154* 154* 133*    CBG (last 3)   Recent Labs  12/10/12 0439 12/10/12 0739 12/11/12 0740  GLUCAP 133* 127* 139*    Scheduled  Meds: . antiseptic oral rinse  1 application Mouth Rinse QID  . chlorhexidine  15 mL Mouth/Throat BID  . cholecalciferol  2,000 Units Oral Daily  . cyanocobalamin  500 mcg Per Tube Daily  . doxycycline  100 mg Oral Q12H  . enoxaparin (LOVENOX) injection  30 mg Subcutaneous Q24H  . feeding supplement (JEVITY 1.2 CAL)  1,000 mL Per Tube Q24H  . feeding supplement  30 mL Per Tube BID  . levothyroxine  88 mcg Per Tube QAC breakfast  . memantine  5 mg Per Tube BID  . pantoprazole sodium  40 mg Per Tube Daily  . polyvinyl alcohol  1 drop Both Eyes QID  . prednisoLONE acetate  1 drop Left Eye QID  . scopolamine  1 patch Transdermal Q72H  . sodium chloride  10-40 mL Intracatheter Q12H    Continuous Infusions:   Maureen Chatters, RD, LDN Pager #: [phone] After-Hours Pager #: [phone]

## 2012-12-12 ENCOUNTER — Inpatient Hospital Stay
Admission: AD | Admit: 2012-12-12 | Discharge: 2013-01-06 | Disposition: A | Discharge status: Rehabilitation Facility | Payer: Self-pay | Source: Ambulatory Visit | Attending: Internal Medicine | Admitting: Internal Medicine

## 2012-12-12 ENCOUNTER — Other Ambulatory Visit (HOSPITAL_COMMUNITY): Payer: Self-pay

## 2012-12-12 MED ORDER — BIOTENE DRY MOUTH MT LIQD: 1.0000 "application " | Freq: Four times a day (QID) | OROMUCOSAL | Status: DC

## 2012-12-12 MED ORDER — MEMANTINE HCL 5 MG PO TABS: 5.0000 mg | ORAL_TABLET | Freq: Two times a day (BID) | ORAL | Status: DC

## 2012-12-12 MED ORDER — DOXYCYCLINE HYCLATE 100 MG PO TABS: 100.0000 mg | ORAL_TABLET | Freq: Two times a day (BID) | ORAL | Status: DC

## 2012-12-12 MED ORDER — CHLORHEXIDINE GLUCONATE 0.12 % MT SOLN: 15.0000 mL | Freq: Two times a day (BID) | OROMUCOSAL | Status: AC

## 2012-12-12 MED ORDER — PREDNISOLONE ACETATE 1 % OP SUSP: 1.0000 [drp] | Freq: Four times a day (QID) | OPHTHALMIC | Status: DC

## 2012-12-12 MED ORDER — SODIUM CHLORIDE 0.9 % IJ SOLN: 10.0000 mL | Freq: Two times a day (BID) | INTRAMUSCULAR | Status: DC

## 2012-12-12 MED ORDER — POLYVINYL ALCOHOL 1.4 % OP SOLN: 1.0000 [drp] | OPHTHALMIC | Status: DC | PRN

## 2012-12-12 MED ORDER — PANTOPRAZOLE SODIUM 40 MG PO PACK: 40.0000 mg | PACK | Freq: Every day | ORAL | Status: DC

## 2012-12-12 MED ORDER — ENOXAPARIN SODIUM 30 MG/0.3ML ~~LOC~~ SOLN: 30.0000 mg | SUBCUTANEOUS | Status: DC

## 2012-12-12 MED ORDER — CYANOCOBALAMIN 500 MCG PO TABS: 500.0000 ug | ORAL_TABLET | Freq: Every day | ORAL | Status: AC

## 2012-12-12 MED ORDER — POLYVINYL ALCOHOL 1.4 % OP SOLN: 1.0000 [drp] | Freq: Four times a day (QID) | OPHTHALMIC | Status: DC

## 2012-12-12 MED ORDER — SCOPOLAMINE 1 MG/3DAYS TD PT72: 1.0000 | MEDICATED_PATCH | TRANSDERMAL | Status: DC

## 2012-12-12 MED ORDER — ACETAMINOPHEN 160 MG/5ML PO SOLN: 650.0000 mg | ORAL | Status: DC | PRN

## 2012-12-12 MED ORDER — ALBUTEROL SULFATE (5 MG/ML) 0.5% IN NEBU: 2.5000 mg | INHALATION_SOLUTION | RESPIRATORY_TRACT | Status: DC | PRN

## 2012-12-12 MED ORDER — JEVITY 1.2 CAL PO LIQD: 1000.0000 mL | ORAL | Status: DC

## 2012-12-12 MED ORDER — POTASSIUM CHLORIDE 20 MEQ/15ML (10%) PO LIQD
ORAL | Status: AC
  Filled 2012-12-12: qty 30

## 2012-12-12 MED ORDER — LEVOTHYROXINE SODIUM 88 MCG PO TABS: 88.0000 ug | ORAL_TABLET | Freq: Every day | ORAL | Status: DC

## 2012-12-12 MED ORDER — PRO-STAT SUGAR FREE PO LIQD: 30.0000 mL | Freq: Two times a day (BID) | ORAL | Status: DC

## 2012-12-12 MED ORDER — SODIUM CHLORIDE 0.9 % IJ SOLN: 10.0000 mL | INTRAMUSCULAR | Status: DC | PRN

## 2012-12-12 MED ORDER — POTASSIUM CHLORIDE 20 MEQ/15ML (10%) PO LIQD
40.0000 meq | Freq: Once | ORAL | Status: AC
  Administered 2012-12-12: 40 meq

## 2012-12-12 MED ORDER — VITAMIN D3 25 MCG (1000 UNIT) PO TABS: 2000.0000 [IU] | ORAL_TABLET | Freq: Every day | ORAL | Status: AC

## 2012-12-12 NOTE — Progress Notes (Signed)
Patient discussed in Long Length of Stay meeting today- advised that the family has cancelled her [facility] Medicare policy and should be transitioned to Medicare which would an LTACH admission - RNCM is working to confirm the status of her Medicare and this possibility- will follow  Reece Levy, MSW [phone]

## 2012-12-12 NOTE — Progress Notes (Signed)
Message left for patient's niece/POA to return my call regarding d/c plans- RNCM has also left messages in an attempt to reach her today. Reece Levy, MSW [phone]

## 2012-12-12 NOTE — Discharge Summary (Signed)
Physician Discharge Summary  Patient ID: Jenny Brady MRN: [id number] DOB/AGE: Oct 07, 1925 79 y.o.  Admit date: 11/11/2012 Discharge date: 12/12/2012    Discharge Diagnoses:  Acute Respiratory Failure Vocal Cord Edema (post extubation) Fever Tracheostomy Status MRSA Tracheobronchitis CHF Hyperlipidemia Hypokalemia Malnutrition Dysphagia Malfunctioning PEG MRSA PEG Site Infection Anemia Hypothyroidism Hyperglycemia Hx of CVA Profound Debilitation L Eye Closure R Heel Wound                                                                     DISCHARGE PLAN BY DIAGNOSIS     Acute on chronic respiratory failure - aspiration  Vocal Cord Edema - post extubation, required tracheostomy 8/14  Tracheostomy Dependence  MRSA tracheobronchitis   Discharge Plan:  Cont ATC as tolerated  PMV per RT/ SLP  Aspiration Precautions Pulmonary hygiene as able, upright positioning Trach care BID with inner cannula change QD PMV with full supervision only    CHF  Hyperlipidemia   Discharge Plan:  Monitoring off statins Keep Euvolemic   Hypokalemia   Discharge Plan: Monitor BMET intermittently  Correct electrolytes as indicated    Malnutrition Dysphagia - S/p PEG  Malfunctioning PEG - replaced 8/7 and 8/28   Discharge Plan: Cont stress ulcer prophalyxis Continue TFs -->Jevity 1.2 formula infusing at 50 ml/hr via PEG tube with Prostat liquid protein 30 ml twice daily providing 1640 total kcals, 97 gm protein, 968 ml of free water. Nutrition consult upon transfer PEG care BID NPO   Mild anemia   Discharge Plan: CBC imtermittently     Fever - resolved  MRSA tracheobronchitis  MRSA PEG site infection  Neither of these are invasive or life-threatening   Discharge Plan: D/C Vanc  Complete doxy per tube X 14 days - first dose on 9/2   Hyperglycemia - resolved  Hypothyroidism   Discharge Plan: D/C SSI  Cont Levothyroxine     H/o CVA Dementia  Profound  debilitation   Discharge Plan: Rehab efforts per LTAC Was on Keppra prior to d/c but has been off since admit, no seizure activity or known hx.     L Eye Closure - patient keeps left eye closed, favors R.  Evaluated by Opthalmology with rec's below.   Discharge Plan: Pred Mild OS QID for 2 weeks  Continue Artificial Tears QID OU  Start Lacrilube/Refresh PM OU QHS   R Heel Wound   Discharge Plan: Keep site clean / dry. Aquacel dressing QD with wet to dry over and then dry wrap WOC for eval on transfer                DISCHARGE SUMMARY  Jenny Brady is a 79 y.o. y/o female with a PMH of Hyperthyroidism, CAD, GERD, Dementia, CHF (EF 15% 07/2012), Mural thrombus of cardiac apex, obesity, IBS, DVT (remote / 2005, post trauma), OSA and CVA in 07/2012 with resultant R hemiplegia & aphasia.  Patient is s/p PEG placement with chronic indwelling foley at baseline.  Presented to [facility] ER on 11/11/12 with altered mental status & hypoxia.  Patient required intubation / mechanical ventilation in ER for airway protection.  On admit, patient was noted to have a R heel hematoma that was debrided by EDP.  Patient had multiple metabolic  derangements on admit.  She was treated empirically for HCAP with abx (see below) given noted pooled secretions in oropharynx during intubation.  Patient was pan cultured with findings of MRSA in sputum (sens vanco) and MRSA at PEG site.  Patient was extubated 8/12 and had significant vocal cord edema with stridor requiring reintubation and subsequent tracheostomy on 8/15.  Hospital course further complicated by PEG malfunctions requiring replacement of PEG per IR x2, most recent on 8/29.   She has been transitioned to doxycycline for MRSA infection.  Home synthroid maintained for replacement.   Currently, patient has been working with SLP for PMV use and recommended to only use with supervision.               SIGNIFICANT EVENTS / STUDIES:  8/04 - Admit, acute on  chronic resp fx in setting of CVA  8/07 - Replaced malpositioned PEG  8/12 - extubated, reintubnated in 30 min , severe cord edema  8/13 - weaning in chair  8/15 - trach  8/25 - mod secretions, pending bed placement at [facility]  8/28- concern peg fxn  8/28 - CT abdo-Gastrostomy tube has been partially withdrawn with the balloon now<BR>located within the subcutaneous soft tissues external to the<BR>stomach  8/29 - peg replaced (again)   LINES / TUBES:  ETT 8/4 >>>8/12, 8/12 (stridor) >> 8/14  G tube PTA >> 8/7  Trach 8/14 (DF) >>   CULTURES:  8/4 Blood >>>Neg  8/4 Urine >>> negative  Urine 8/17 >> 00 K YEAST  Resp 8/17 > MRSA, vanc sens  Blood 8/17 >> neg  PEG site wound 8/30 MRSA   ANTIBIOTICS:  Zosyn 8/4 >>>off  Vancomycin 8/4 > 8/8  Vanc 8/16 >>8/25  Vanc 8/31>> 9/02  Ceftaz 8/16 > 8/8  Doxy 9/02 >> (14 days planned)    Discharge Exam: General: No distress on TC  Neuro: Chronic right sided hemiplegia, aphasic  Neck : Trach site clean, no secretions at all this am.  Cardiovascular: RRR s M  Lungs: CTA anterior  Abdomen: Soft, nontender, bowel sounds wnl , PEG site minor redness  Musculoskeletal: No edema      Filed Vitals:   12/12/12 0800 12/12/12 1000 12/12/12 1122 12/12/12 1143  BP: 107/55 120/60 120/60 110/57  Pulse: 84 88 86 92  Temp:    97.1 F (36.2 C)  TempSrc:    Axillary  Resp: 15 20 22 21   Height:      Weight:      SpO2: 99% 96% 100% 98%     Discharge Labs  BMET  Recent Labs Lab 12/07/12 0450 12/08/12 0500 12/09/12 0435  NA 138 139 140  K 3.6 3.6 3.4*  CL 102 104 105  CO2 27 28 28   GLUCOSE 154* 154* 133*  BUN 18 16 15   CREATININE 0.39* 0.36* 0.34*  CALCIUM 8.3* 8.2* 8.3*   CBC  Recent Labs Lab 12/08/12 0500  HGB 8.3*  HCT 26.2*  WBC 6.7  PLT 388        Discharge Orders   Future Appointments Provider Department Dept Phone   01/30/2013 2:00 PM Ronal Fear, NP [facility] [phone]   Future Orders  Complete By Expires   Diet general  As directed    Comments:     Tube feeds        Medication List    STOP taking these medications       acetaminophen 325 MG tablet  Commonly known as:  TYLENOL  Replaced by:  acetaminophen 160 MG/5ML solution     albuterol (2.5 MG/3ML) 0.083% nebulizer solution  Commonly known as:  PROVENTIL  Replaced by:  albuterol (5 MG/ML) 0.5% nebulizer solution     docusate 50 MG/5ML liquid  Commonly known as:  COLACE     free water Soln     furosemide 40 MG tablet  Commonly known as:  LASIX     heparin 5000 UNIT/ML injection     lansoprazole 30 MG capsule  Commonly known as:  PREVACID  Replaced by:  pantoprazole sodium 40 mg/20 mL Pack     levETIRAcetam 100 MG/ML solution  Commonly known as:  KEPPRA     polyethylene glycol packet  Commonly known as:  MIRALAX / GLYCOLAX     pravastatin 40 MG tablet  Commonly known as:  PRAVACHOL      TAKE these medications       acetaminophen 160 MG/5ML solution  Commonly known as:  TYLENOL  Place 20.3 mLs (650 mg total) into feeding tube every 4 (four) hours as needed for fever.     albuterol (5 MG/ML) 0.5% nebulizer solution  Commonly known as:  PROVENTIL  Take 0.5 mLs (2.5 mg total) by nebulization every 3 (three) hours as needed for wheezing or shortness of breath.     antiseptic oral rinse Liqd  15 mLs by Mouth Rinse route QID.     chlorhexidine 0.12 % solution  Commonly known as:  PERIDEX  Use as directed 15 mLs in the mouth or throat 2 (two) times daily.     cholecalciferol 1000 UNITS tablet  Commonly known as:  VITAMIN D  Take 2 tablets (2,000 Units total) by mouth daily.     cyanocobalamin 500 MCG tablet  Place 1 tablet (500 mcg total) into feeding tube daily.     doxycycline 100 MG tablet  Commonly known as:  VIBRA-TABS  Take 1 tablet (100 mg total) by mouth every 12 (twelve) hours.     enoxaparin 30 MG/0.3ML injection  Commonly known as:  LOVENOX  Inject 0.3 mLs (30 mg total)  into the skin daily.     feeding supplement (JEVITY 1.2 CAL) Liqd  Place 1,000 mLs into feeding tube daily.     feeding supplement Liqd  Place 30 mLs into feeding tube 2 (two) times daily.     levothyroxine 88 MCG tablet  Commonly known as:  SYNTHROID, LEVOTHROID  Place 1 tablet (88 mcg total) into feeding tube daily before breakfast.     memantine 5 MG tablet  Commonly known as:  NAMENDA  Place 1 tablet (5 mg total) into feeding tube 2 (two) times daily.     pantoprazole sodium 40 mg/20 mL Pack  Commonly known as:  PROTONIX  Place 20 mLs (40 mg total) into feeding tube daily.     polyvinyl alcohol 1.4 % ophthalmic solution  Commonly known as:  LIQUIFILM TEARS  Place 1 drop into both eyes as needed (eye discomfort).     polyvinyl alcohol 1.4 % ophthalmic solution  Commonly known as:  LIQUIFILM TEARS  Place 1 drop into both eyes 4 (four) times daily.     prednisoLONE acetate 1 % ophthalmic suspension  Commonly known as:  PRED FORTE  Place 1 drop into the left eye 4 (four) times daily.     scopolamine 1.5 MG  Commonly known as:  TRANSDERM-SCOP  Place 1 patch (1.5 mg total) onto the skin every 3 (three) days.     sodium  chloride 0.9 % injection  10-40 mLs by Intracatheter route every 12 (twelve) hours.     sodium chloride 0.9 % injection  10-40 mLs by Intracatheter route as needed (flush).          Disposition: Pending discharge to [facility] for further rehab.   Discharged Condition: Aziyah Provencal Hindle has met maximum benefit of inpatient care and is medically stable and cleared for discharge.  Patient is pending follow up as above.      Time spent on disposition:  Greater than 35 minutes.   Signed: Canary Brim, NP-C  Pulmonary & Critical Care Pgr: [phone] Office: [phone]    Agree with above  Billy Fischer, MD ; [facility] service Mobile [phone].  After 5:30 PM or weekends, call [phone]

## 2012-12-13 ENCOUNTER — Other Ambulatory Visit (HOSPITAL_COMMUNITY): Payer: Self-pay

## 2012-12-13 LAB — CBC WITH DIFFERENTIAL/PLATELET
Basophils Relative: 0 % (ref 0–1)
Eosinophils Relative: 4 % (ref 0–5)
HCT: 33.8 % — ABNORMAL LOW (ref 36.0–46.0)
Hemoglobin: 11.1 g/dL — ABNORMAL LOW (ref 12.0–15.0)
MCH: 25.4 pg — ABNORMAL LOW (ref 26.0–34.0)
MCV: 77.3 fL — ABNORMAL LOW (ref 78.0–100.0)
Monocytes Absolute: 0.9 10*3/uL (ref 0.1–1.0)
Neutro Abs: 4.3 10*3/uL (ref 1.7–7.7)
Neutrophils Relative %: 59 % (ref 43–77)
RBC: 4.37 MIL/uL (ref 3.87–5.11)

## 2012-12-13 LAB — BASIC METABOLIC PANEL
CO2: 27 mEq/L (ref 19–32)
Calcium: 9 mg/dL (ref 8.4–10.5)
Glucose, Bld: 185 mg/dL — ABNORMAL HIGH (ref 70–99)
Potassium: 3.9 mEq/L (ref 3.5–5.1)
Sodium: 138 mEq/L (ref 135–145)

## 2012-12-13 LAB — PROCALCITONIN: Procalcitonin: <0.1 ng/mL

## 2012-12-13 LAB — PHOSPHORUS: Phosphorus: 4 mg/dL (ref 2.3–4.6)

## 2012-12-13 LAB — HEPATIC FUNCTION PANEL
AST: 12 U/L (ref 0–37)
Albumin: 2.8 g/dL — ABNORMAL LOW (ref 3.5–5.2)
Total Bilirubin: 0.2 mg/dL — ABNORMAL LOW (ref 0.3–1.2)
Total Protein: 6.5 g/dL (ref 6.0–8.3)

## 2012-12-13 LAB — PRO B NATRIURETIC PEPTIDE: Pro B Natriuretic peptide (BNP): 78.7 pg/mL (ref 0–450)

## 2012-12-13 LAB — PREALBUMIN: Prealbumin: 18.7 mg/dL (ref 17.0–34.0)

## 2012-12-13 LAB — HEMOGLOBIN A1C: Hgb A1c MFr Bld: 8.2 % — ABNORMAL HIGH (ref <5.7)

## 2012-12-14 ENCOUNTER — Other Ambulatory Visit (HOSPITAL_COMMUNITY): Payer: Medicare Other

## 2012-12-14 DIAGNOSIS — L97909 Non-pressure chronic ulcer of unspecified part of unspecified lower leg with unspecified severity: Secondary | ICD-10-CM

## 2012-12-14 LAB — MAGNESIUM: Magnesium: 1.8 mg/dL (ref 1.5–2.5)

## 2012-12-14 LAB — TROPONIN I: Troponin I: <0.3 ng/mL (ref <0.30)

## 2012-12-14 LAB — BASIC METABOLIC PANEL
CO2: 28 mEq/L (ref 19–32)
Calcium: 8.8 mg/dL (ref 8.4–10.5)
GFR calc Af Amer: >90 mL/min (ref >90)
GFR calc non Af Amer: >90 mL/min (ref >90)
Sodium: 138 mEq/L (ref 135–145)

## 2012-12-14 NOTE — Progress Notes (Signed)
VASCULAR LAB PRELIMINARY  ARTERIAL  ABI completed: Study was technically difficult/limited due to poor patient cooperation.   RIGHT    LEFT    PRESSURE WAVEFORM  PRESSURE WAVEFORM  BRACHIAL 109 Triphasic BRACHIAL Unable to obtain Unable to obtain  DP 129 Triphasic DP Unable to obtain Triphasic  AT   AT    PT 114 Triphasic PT Unable to obtain Unable to obtain  PER   PER    GREAT TOE  NA GREAT TOE  NA    RIGHT LEFT  ABI 1.18     The right ABI is within normal limits. The left dorsalis pedis artery waveform is within normal limits. Unable to evaluate left posterior tibial and brachial arteries, and unable to obtain left dorsalis pedis artery pressure, therefore unable to calculate left ABI.  12/14/2012 2:47 PM Gertie Fey, RVT, RDCS, RDMS

## 2012-12-15 ENCOUNTER — Other Ambulatory Visit (HOSPITAL_COMMUNITY): Payer: Self-pay

## 2012-12-16 ENCOUNTER — Other Ambulatory Visit (HOSPITAL_COMMUNITY): Payer: Medicare Other

## 2012-12-17 LAB — BASIC METABOLIC PANEL
BUN: 17 mg/dL (ref 6–23)
Calcium: 9.1 mg/dL (ref 8.4–10.5)
GFR calc non Af Amer: >90 mL/min (ref >90)
Glucose, Bld: 137 mg/dL — ABNORMAL HIGH (ref 70–99)
Sodium: 137 mEq/L (ref 135–145)

## 2012-12-17 LAB — CBC
MCH: 29.5 pg (ref 26.0–34.0)
MCHC: 31.6 g/dL (ref 30.0–36.0)
Platelets: 361 10*3/uL (ref 150–400)

## 2012-12-18 LAB — WOUND CULTURE: Gram Stain: NONE SEEN

## 2012-12-23 LAB — URINALYSIS, ROUTINE W REFLEX MICROSCOPIC
Ketones, ur: NEGATIVE mg/dL
Nitrite: NEGATIVE
Protein, ur: NEGATIVE mg/dL
Urobilinogen, UA: 1 mg/dL (ref 0.0–1.0)

## 2012-12-23 LAB — URINE MICROSCOPIC-ADD ON

## 2012-12-24 ENCOUNTER — Other Ambulatory Visit (HOSPITAL_COMMUNITY): Payer: Medicare Other

## 2012-12-24 LAB — CBC
HCT: 29.3 % — ABNORMAL LOW (ref 36.0–46.0)
MCHC: 32.1 g/dL (ref 30.0–36.0)
RDW: 16.9 % — ABNORMAL HIGH (ref 11.5–15.5)

## 2012-12-24 LAB — BASIC METABOLIC PANEL
BUN: 16 mg/dL (ref 6–23)
GFR calc Af Amer: >90 mL/min (ref >90)
GFR calc non Af Amer: >90 mL/min (ref >90)
Potassium: 3.4 mEq/L — ABNORMAL LOW (ref 3.5–5.1)
Sodium: 138 mEq/L (ref 135–145)

## 2012-12-26 LAB — CBC WITH DIFFERENTIAL/PLATELET
Basophils Absolute: 0 10*3/uL (ref 0.0–0.1)
HCT: 28.9 % — ABNORMAL LOW (ref 36.0–46.0)
Lymphocytes Relative: 33 % (ref 12–46)
Neutro Abs: 2.6 10*3/uL (ref 1.7–7.7)
Neutrophils Relative %: 51 % (ref 43–77)
Platelets: 285 10*3/uL (ref 150–400)
RDW: 17 % — ABNORMAL HIGH (ref 11.5–15.5)
WBC: 5 10*3/uL (ref 4.0–10.5)

## 2012-12-26 LAB — BASIC METABOLIC PANEL
Chloride: 103 mEq/L (ref 96–112)
Creatinine, Ser: 0.42 mg/dL — ABNORMAL LOW (ref 0.50–1.10)
GFR calc Af Amer: >90 mL/min (ref >90)
GFR calc non Af Amer: >90 mL/min (ref >90)

## 2013-01-02 LAB — BASIC METABOLIC PANEL
BUN: 23 mg/dL (ref 6–23)
Chloride: 105 mEq/L (ref 96–112)
Glucose, Bld: 191 mg/dL — ABNORMAL HIGH (ref 70–99)
Potassium: 4.3 mEq/L (ref 3.5–5.1)
Sodium: 140 mEq/L (ref 135–145)

## 2013-01-02 LAB — CBC
HCT: 31.5 % — ABNORMAL LOW (ref 36.0–46.0)
Hemoglobin: 10.1 g/dL — ABNORMAL LOW (ref 12.0–15.0)
RBC: 3.4 MIL/uL — ABNORMAL LOW (ref 3.87–5.11)
WBC: 6.2 10*3/uL (ref 4.0–10.5)

## 2013-01-28 ENCOUNTER — Telehealth: Payer: Self-pay | Admitting: Nurse Practitioner

## 2013-01-28 NOTE — Telephone Encounter (Signed)
Called patient to remind her of an appointment 01/30/13 with lynn lam. Could not reach patient, Invalid phone#

## 2013-01-30 ENCOUNTER — Ambulatory Visit: Payer: Medicare Other | Admitting: Nurse Practitioner

## 2013-07-13 ENCOUNTER — Emergency Department (HOSPITAL_COMMUNITY)
Admission: EM | Admit: 2013-07-13 | Discharge: 2013-07-13 | Disposition: A | Discharge status: Other Acute Inpatient Hospital | Payer: Medicare Other | Attending: Emergency Medicine | Admitting: Emergency Medicine

## 2013-07-13 ENCOUNTER — Emergency Department (HOSPITAL_COMMUNITY): Payer: Medicare Other

## 2013-07-13 ENCOUNTER — Encounter (HOSPITAL_COMMUNITY): Payer: Self-pay | Admitting: Emergency Medicine

## 2013-07-13 DIAGNOSIS — R0603 Acute respiratory distress: Secondary | ICD-10-CM

## 2013-07-13 DIAGNOSIS — I5022 Chronic systolic (congestive) heart failure: Secondary | ICD-10-CM | POA: Insufficient documentation

## 2013-07-13 DIAGNOSIS — Z79899 Other long term (current) drug therapy: Secondary | ICD-10-CM | POA: Insufficient documentation

## 2013-07-13 DIAGNOSIS — E669 Obesity, unspecified: Secondary | ICD-10-CM | POA: Insufficient documentation

## 2013-07-13 DIAGNOSIS — F0390 Unspecified dementia without behavioral disturbance: Secondary | ICD-10-CM | POA: Insufficient documentation

## 2013-07-13 DIAGNOSIS — I252 Old myocardial infarction: Secondary | ICD-10-CM | POA: Insufficient documentation

## 2013-07-13 DIAGNOSIS — R143 Flatulence: Secondary | ICD-10-CM

## 2013-07-13 DIAGNOSIS — Y833 Surgical operation with formation of external stoma as the cause of abnormal reaction of the patient, or of later complication, without mention of misadventure at the time of the procedure: Secondary | ICD-10-CM | POA: Insufficient documentation

## 2013-07-13 DIAGNOSIS — R142 Eructation: Secondary | ICD-10-CM | POA: Insufficient documentation

## 2013-07-13 DIAGNOSIS — Z8673 Personal history of transient ischemic attack (TIA), and cerebral infarction without residual deficits: Secondary | ICD-10-CM | POA: Insufficient documentation

## 2013-07-13 DIAGNOSIS — R141 Gas pain: Secondary | ICD-10-CM | POA: Insufficient documentation

## 2013-07-13 DIAGNOSIS — J811 Chronic pulmonary edema: Secondary | ICD-10-CM | POA: Insufficient documentation

## 2013-07-13 DIAGNOSIS — J9503 Malfunction of tracheostomy stoma: Secondary | ICD-10-CM | POA: Insufficient documentation

## 2013-07-13 DIAGNOSIS — I2510 Atherosclerotic heart disease of native coronary artery without angina pectoris: Secondary | ICD-10-CM | POA: Insufficient documentation

## 2013-07-13 DIAGNOSIS — Z8669 Personal history of other diseases of the nervous system and sense organs: Secondary | ICD-10-CM | POA: Insufficient documentation

## 2013-07-13 DIAGNOSIS — E0590 Thyrotoxicosis, unspecified without thyrotoxic crisis or storm: Secondary | ICD-10-CM | POA: Insufficient documentation

## 2013-07-13 DIAGNOSIS — Z86718 Personal history of other venous thrombosis and embolism: Secondary | ICD-10-CM | POA: Insufficient documentation

## 2013-07-13 DIAGNOSIS — Z8719 Personal history of other diseases of the digestive system: Secondary | ICD-10-CM | POA: Insufficient documentation

## 2013-07-13 LAB — I-STAT ARTERIAL BLOOD GAS, ED
Acid-Base Excess: 2 mmol/L (ref 0.0–2.0)
Bicarbonate: 29.9 mEq/L — ABNORMAL HIGH (ref 20.0–24.0)
O2 Saturation: 94 %
PH ART: 7.306 — AB (ref 7.350–7.450)
TCO2: 32 mmol/L (ref 0–100)
pCO2 arterial: 60 mmHg (ref 35.0–45.0)
pO2, Arterial: 78 mmHg — ABNORMAL LOW (ref 80.0–100.0)

## 2013-07-13 LAB — CBC WITH DIFFERENTIAL/PLATELET
BASOS ABS: 0 10*3/uL (ref 0.0–0.1)
BASOS PCT: 0 % (ref 0–1)
EOS PCT: 0 % (ref 0–5)
Eosinophils Absolute: 0 10*3/uL (ref 0.0–0.7)
HEMATOCRIT: 34.5 % — AB (ref 36.0–46.0)
Hemoglobin: 10.3 g/dL — ABNORMAL LOW (ref 12.0–15.0)
Lymphocytes Relative: 4 % — ABNORMAL LOW (ref 12–46)
Lymphs Abs: 0.6 10*3/uL — ABNORMAL LOW (ref 0.7–4.0)
MCH: 25 pg — ABNORMAL LOW (ref 26.0–34.0)
MCHC: 29.9 g/dL — AB (ref 30.0–36.0)
MCV: 83.7 fL (ref 78.0–100.0)
MONO ABS: 0.7 10*3/uL (ref 0.1–1.0)
Monocytes Relative: 4 % (ref 3–12)
NEUTROS ABS: 15.2 10*3/uL — AB (ref 1.7–7.7)
Neutrophils Relative %: 92 % — ABNORMAL HIGH (ref 43–77)
Platelets: 289 10*3/uL (ref 150–400)
RBC: 4.12 MIL/uL (ref 3.87–5.11)
RDW: 15.2 % (ref 11.5–15.5)
WBC: 16.5 10*3/uL — ABNORMAL HIGH (ref 4.0–10.5)

## 2013-07-13 LAB — COMPREHENSIVE METABOLIC PANEL
ALBUMIN: 2.4 g/dL — AB (ref 3.5–5.2)
ALT: 9 U/L (ref 0–35)
AST: 22 U/L (ref 0–37)
Alkaline Phosphatase: 88 U/L (ref 39–117)
BUN: 22 mg/dL (ref 6–23)
CALCIUM: 8.7 mg/dL (ref 8.4–10.5)
CHLORIDE: 100 meq/L (ref 96–112)
CO2: 25 mEq/L (ref 19–32)
CREATININE: 0.58 mg/dL (ref 0.50–1.10)
GFR calc Af Amer: >90 mL/min (ref >90)
GFR calc non Af Amer: 82 mL/min — ABNORMAL LOW (ref >90)
Glucose, Bld: 145 mg/dL — ABNORMAL HIGH (ref 70–99)
Potassium: 4.8 mEq/L (ref 3.7–5.3)
Sodium: 139 mEq/L (ref 137–147)
Total Bilirubin: 0.5 mg/dL (ref 0.3–1.2)
Total Protein: 7.3 g/dL (ref 6.0–8.3)

## 2013-07-13 LAB — PRO B NATRIURETIC PEPTIDE: Pro B Natriuretic peptide (BNP): 19140 pg/mL — ABNORMAL HIGH (ref 0–450)

## 2013-07-13 LAB — I-STAT TROPONIN, ED: TROPONIN I, POC: 0.03 ng/mL (ref 0.00–0.08)

## 2013-07-13 LAB — I-STAT CG4 LACTIC ACID, ED: Lactic Acid, Venous: 1.53 mmol/L (ref 0.5–2.2)

## 2013-07-13 NOTE — ED Notes (Signed)
Dr. Ranae Palms and respiratory therapist attempting to reinsert trach at this time. Patient continues to be bagged by BVM. O2 at 100%.

## 2013-07-13 NOTE — ED Notes (Signed)
Dr. Ranae Palms aware of hypotension- verbal order to give NS bolus.

## 2013-07-13 NOTE — ED Notes (Signed)
[facility] at bedside for transport. 

## 2013-07-13 NOTE — Progress Notes (Signed)
Patient came in via EMS with trach dislodged. Patient brought into emergency department with [facility] airway intact. A number 5.5 shiley cuffed trach was inserted into stoma sight with difficulty. [name] was confirmed with EZ cap positive color change and suction catheter was passed without difficulty. Arterial blood gas was drawn while patient was wearing 100% trach collar. Dr. Bobbie Stack notified of blood gas results. Placed patient on the ventilator.

## 2013-07-13 NOTE — ED Notes (Signed)
Trach placed at this time by respiratory therapist. Dr. Ranae Palms assisting.

## 2013-07-13 NOTE — ED Notes (Signed)
Patient presents to ED via [facility]. Patient is from [facility] where her trach became dislodged per staff. Unknown time that trach became dislodged- staff at [facility] attempted to reinsert without success. [facility] staff placed a king airway and ventilated patient by BVM. 6mg  Ativan and 50 mcg Fentanyl given during king airway insertion. Upon arrival to ED patient is being ventilated by BVM- remains sedated at this time. VSS.

## 2013-07-13 NOTE — ED Provider Notes (Signed)
CSN: [id number]     Arrival date & time 07/13/13  7829 History   First MD Initiated Contact with Patient 07/13/13 0559     Chief Complaint  Patient presents with  . Respiratory Distress     (Consider location/radiation/quality/duration/timing/severity/associated sxs/prior Treatment) HPI Patient is brought in by EMS from [facility] for dislodged trach. Trach dislodged sometime last evening. Staff was unable to replace trach. Staff then attempted to intubate the patient orally and were unable. They placed a cane airway emesis bagging patient on arrival to the emergency department. 6 mg of Ativan and 50 mcg normal for given during the HEENT airway insertion. Patient is not able to answer questions. Past Medical History  Diagnosis Date  . Hyperthyroidism     had RAI-is on thyroid replacement, happened maybe this summer  . Coronary artery disease ?, before 2005    Had a catheterization per son by Dr. [name] in 1991 no reports in  [place].  note from 2005 mentions hx of coronary stent  . GERD (gastroesophageal reflux disease)   . Stroke 07/2012    Right hemiplegia, dysphagia, aphasia.  . Dementia   . Aphasia 07/2012  . Chronic systolic heart failure     07/2012 the EF was 15%  . Mural thrombus of cardiac apex   . Obesity   . IBS (irritable bowel syndrome)   . DVT (deep venous thrombosis) before 2005    occurred after trauma, treated for a while with Coumadin.   Marland Kitchen Dysphagia due to recent stroke 07/2012    ok'd for purree/pudding thick liquids but G tube placed by IR  . OSA (obstructive sleep apnea)     refused CPAP per notes.    Past Surgical History  Procedure Laterality Date  . Tee without cardioversion N/A 07/26/2012    Procedure: TRANSESOPHAGEAL ECHOCARDIOGRAM (TEE);  Surgeon: Laurey Morale, MD;  Location: [facility] ENDOSCOPY;  Service: Cardiovascular;  Laterality: N/A;  . Gastric feeding tube  08/02/2012    placed in radiology   Family History  Problem Relation Age of Onset  . Acne  Mother     died giving birth to 8th kid  . Dementia Father     died 81  . Acne Brother     died 2-3 yrs ago  . Cervical cancer Sister    History  Substance Use Topics  . Smoking status: Never Smoker   . Smokeless tobacco: Never Used  . Alcohol Use: No   OB History   Grav Para Term Preterm Abortions TAB SAB Ect Mult Living                 Review of Systems  Unable to perform ROS: Intubated      Allergies  Hyoscyamine; Ivp dye; and Septra  Home Medications   Current Outpatient Rx  Name  Route  Sig  Dispense  Refill  . acetaminophen (TYLENOL) 160 MG/5ML solution   Per Tube   Place 20.3 mLs (650 mg total) into feeding tube every 4 (four) hours as needed for fever.   120 mL   0   . amoxicillin (AMOXIL) 875 MG tablet   Oral   Take 875 mg by mouth 2 (two) times daily. For 14 days beginning 07/03/13 UTI         . chlorhexidine (PERIDEX) 0.12 % solution   Mouth/Throat   Use as directed 15 mLs in the mouth or throat 2 (two) times daily.   120 mL   0   .  cholecalciferol (VITAMIN D) 1000 UNITS tablet   Oral   Take 2 tablets (2,000 Units total) by mouth daily.         . cyanocobalamin 500 MCG tablet   Per Tube   Place 1 tablet (500 mcg total) into feeding tube daily.         . feeding supplement (PRO-STAT SUGAR FREE 64) LIQD   Per Tube   Place 30 mLs into feeding tube 2 (two) times daily.   900 mL   0   . ipratropium-albuterol (DUONEB) 0.5-2.5 (3) MG/3ML SOLN   Nebulization   Take 3 mLs by nebulization every 6 (six) hours.         Marland Kitchen. levothyroxine (SYNTHROID, LEVOTHROID) 100 MCG tablet   Oral   Take 100 mcg by mouth daily before breakfast.         . losartan (COZAAR) 25 MG tablet   Tube   Give 12.5 mg by tube daily.         . memantine (NAMENDA) 10 MG tablet   Tube   Give 10 mg by tube 2 (two) times daily.         . Multiple Vitamin (MULTIVITAMIN WITH MINERALS) TABS tablet   Tube   Give 1 tablet by tube daily.         Marland Kitchen. nystatin  (MYCOSTATIN/NYSTOP) 100000 UNIT/GM POWD   Topical   Apply 1 g topically 2 (two) times daily. Apply to groin and abdomenal folds         . polyvinyl alcohol (LIQUIFILM TEARS) 1.4 % ophthalmic solution   Both Eyes   Place 1 drop into both eyes 4 (four) times daily.   15 mL   0   . Nutritional Supplements (FEEDING SUPPLEMENT, JEVITY 1.2 CAL,) LIQD   Per Tube   Place 1,000 mLs into feeding tube daily.      0    BP 94/52  Pulse 76  Resp 18  SpO2 100% Physical Exam  Nursing note and vitals reviewed. Constitutional:  Patient sedated and being bagged by EMS  HENT:  Mouth/Throat: Oropharynx is clear and moist.  King airway in place.  Eyes: EOM are normal. Pupils are equal, round, and reactive to light.  Neck: No tracheal deviation present.  Trach stoma is open and visualized. No active bleeding.  Cardiovascular: Normal rate and regular rhythm.   Pulmonary/Chest: She is in respiratory distress. She has rales (diffuse rales).  Crepitance to anterior chest and neck  Abdominal: Soft. Bowel sounds are normal. She exhibits distension. She exhibits no mass. There is no tenderness. There is no rebound and no guarding.  Musculoskeletal: Normal range of motion. She exhibits no edema and no tenderness.  Neurological:  Patient sedated but responds to painful stimuli.  Skin: Skin is warm and dry. No rash noted. No erythema.    ED Course  Procedures (including critical care time) Labs Review Labs Reviewed  CBC WITH DIFFERENTIAL - Abnormal; Notable for the following:    WBC 16.5 (*)    Hemoglobin 10.3 (*)    HCT 34.5 (*)    MCH 25.0 (*)    MCHC 29.9 (*)    Neutrophils Relative % 92 (*)    Neutro Abs 15.2 (*)    Lymphocytes Relative 4 (*)    Lymphs Abs 0.6 (*)    All other components within normal limits  COMPREHENSIVE METABOLIC PANEL - Abnormal; Notable for the following:    Glucose, Bld 145 (*)    Albumin 2.4 (*)  GFR calc non Af Amer 82 (*)    All other components within  normal limits  PRO B NATRIURETIC PEPTIDE - Abnormal; Notable for the following:    Pro B Natriuretic peptide (BNP) 19140.0 (*)    All other components within normal limits  I-STAT ARTERIAL BLOOD GAS, ED - Abnormal; Notable for the following:    pH, Arterial 7.306 (*)    pCO2 arterial 60.0 (*)    pO2, Arterial 78.0 (*)    Bicarbonate 29.9 (*)    All other components within normal limits  CULTURE, BLOOD (ROUTINE X 2)  CULTURE, BLOOD (ROUTINE X 2)  I-STAT CG4 LACTIC ACID, ED  Rosezena Sensor, ED   Imaging Review Dg Chest Portable 1 View  07/13/2013   CLINICAL DATA:  Respiratory distress.  EXAM: PORTABLE CHEST - 1 VIEW  COMPARISON:  Chest radiograph performed 12/24/2012  FINDINGS: The patient's tracheostomy tube is seen ending 7-8 cm above the carina.  Hazy bilateral airspace opacities may reflect pulmonary edema or multifocal pneumonia. Retrocardiac airspace opacification is seen. Small bilateral pleural effusions may be present. No definite pneumothorax is seen.  The cardiomediastinal silhouette is mildly enlarged. No acute osseous abnormalities are identified.  A large amount of soft tissue air is noted about the chest and neck, new from the prior study. Per clinical correlation, this most likely reflects air introduced during attempted tracheostomy tube replacements at the patient's long-term care facility.  IMPRESSION: 1. Tracheostomy tube seen ending 7-8 cm above the carina. 2. Hazy bilateral airspace opacities may reflect pulmonary edema or multifocal pneumonia. Small bilateral pleural effusions may be present. 3. Mild cardiomegaly. 4. Large amount of soft tissue air about the chest and neck, new from the prior study. Per clinical correlation, this most likely reflects air introduced during prior attempted tracheostomy tube replacements, as described above.   Electronically Signed   By: Roanna Raider M.D.   On: 07/13/2013 06:53     EKG Interpretation   Date/Time:  Sunday July 13 2013  06:28:51 EDT Ventricular Rate:  93 PR Interval:  140 QRS Duration: 142 QT Interval:  415 QTC Calculation: 516 R Axis:   -124 Text Interpretation:  Age not entered, assumed to be  80 years old for  purpose of ECG interpretation Sinus rhythm Nonspecific intraventricular  conduction delay Consider anterior infarct Confirmed by Ranae Palms  MD,  Tykia Mellone ([id number]) on 07/13/2013 8:15:53 AM     CRITICAL CARE Performed by: Ranae Palms, Brecklyn Galvis Total critical care time: 45 min  Critical care time was exclusive of separately billable procedures and treating other patients. Critical care was necessary to treat or prevent imminent or life-threatening deterioration. Critical care was time spent personally by me on the following activities: development of treatment plan with patient and/or surrogate as well as nursing, discussions with consultants, evaluation of patient's response to treatment, examination of patient, obtaining history from patient or surrogate, ordering and performing treatments and interventions, ordering and review of laboratory studies, ordering and review of radiographic studies, pulse oximetry and re-evaluation of patient's condition. MDM   Final diagnoses:  None    With respiratory therapy, 5.5 shiley cuffed trach placed with some difficulty.  Patient with normal lactic acid. Infiltrates and lungs likely do to pulmonary edema and not infectious process. Blood cultures drawn but no antibiotics given. Patient remains borderline hypotensive and unresponsive. Will discuss with critical care team.   Discussed with critical care and suggested talking with M.D. on call at [facility] Dr. Angelina Ok is on call at  [facility] and states she is capable of managing the patient at this point and accepts patient in transfer back.  Loren Racer, MD 07/13/13 [phone]

## 2013-07-14 ENCOUNTER — Telehealth (HOSPITAL_BASED_OUTPATIENT_CLINIC_OR_DEPARTMENT_OTHER): Payer: Self-pay

## 2013-07-14 NOTE — Telephone Encounter (Addendum)
[facility] calling w/(+) BC.  Informed pt transferred back to [facility].  [facility] contact # provided

## 2013-07-15 LAB — CULTURE, BLOOD (ROUTINE X 2)

## 2013-07-19 LAB — CULTURE, BLOOD (ROUTINE X 2): Culture: NO GROWTH

## 2013-10-14 ENCOUNTER — Inpatient Hospital Stay (HOSPITAL_COMMUNITY): Payer: Medicare Other

## 2013-10-14 ENCOUNTER — Inpatient Hospital Stay (HOSPITAL_COMMUNITY)
Admission: EM | Admit: 2013-10-14 | Discharge: 2013-10-17 | DRG: 871 | Disposition: A | Discharge status: Other Acute Inpatient Hospital | Payer: Medicare Other | Source: Other Acute Inpatient Hospital | Attending: Internal Medicine | Admitting: Internal Medicine

## 2013-10-14 DIAGNOSIS — N183 Chronic kidney disease, stage 3 unspecified: Secondary | ICD-10-CM | POA: Diagnosis present

## 2013-10-14 DIAGNOSIS — G9340 Encephalopathy, unspecified: Secondary | ICD-10-CM

## 2013-10-14 DIAGNOSIS — I5023 Acute on chronic systolic (congestive) heart failure: Secondary | ICD-10-CM | POA: Diagnosis present

## 2013-10-14 DIAGNOSIS — D638 Anemia in other chronic diseases classified elsewhere: Secondary | ICD-10-CM | POA: Diagnosis present

## 2013-10-14 DIAGNOSIS — R1310 Dysphagia, unspecified: Secondary | ICD-10-CM | POA: Diagnosis present

## 2013-10-14 DIAGNOSIS — J9621 Acute and chronic respiratory failure with hypoxia: Secondary | ICD-10-CM

## 2013-10-14 DIAGNOSIS — I429 Cardiomyopathy, unspecified: Secondary | ICD-10-CM

## 2013-10-14 DIAGNOSIS — J9601 Acute respiratory failure with hypoxia: Secondary | ICD-10-CM | POA: Diagnosis present

## 2013-10-14 DIAGNOSIS — J189 Pneumonia, unspecified organism: Secondary | ICD-10-CM

## 2013-10-14 DIAGNOSIS — F0390 Unspecified dementia without behavioral disturbance: Secondary | ICD-10-CM | POA: Diagnosis present

## 2013-10-14 DIAGNOSIS — Z930 Tracheostomy status: Secondary | ICD-10-CM

## 2013-10-14 DIAGNOSIS — K219 Gastro-esophageal reflux disease without esophagitis: Secondary | ICD-10-CM | POA: Diagnosis present

## 2013-10-14 DIAGNOSIS — E669 Obesity, unspecified: Secondary | ICD-10-CM | POA: Diagnosis present

## 2013-10-14 DIAGNOSIS — E119 Type 2 diabetes mellitus without complications: Secondary | ICD-10-CM | POA: Diagnosis present

## 2013-10-14 DIAGNOSIS — J90 Pleural effusion, not elsewhere classified: Secondary | ICD-10-CM

## 2013-10-14 DIAGNOSIS — I69959 Hemiplegia and hemiparesis following unspecified cerebrovascular disease affecting unspecified side: Secondary | ICD-10-CM

## 2013-10-14 DIAGNOSIS — I69991 Dysphagia following unspecified cerebrovascular disease: Secondary | ICD-10-CM

## 2013-10-14 DIAGNOSIS — Z9911 Dependence on respirator [ventilator] status: Secondary | ICD-10-CM

## 2013-10-14 DIAGNOSIS — I2510 Atherosclerotic heart disease of native coronary artery without angina pectoris: Secondary | ICD-10-CM | POA: Diagnosis present

## 2013-10-14 DIAGNOSIS — A419 Sepsis, unspecified organism: Secondary | ICD-10-CM | POA: Diagnosis not present

## 2013-10-14 DIAGNOSIS — R413 Other amnesia: Secondary | ICD-10-CM | POA: Diagnosis present

## 2013-10-14 DIAGNOSIS — R6521 Severe sepsis with septic shock: Secondary | ICD-10-CM

## 2013-10-14 DIAGNOSIS — J9600 Acute respiratory failure, unspecified whether with hypoxia or hypercapnia: Secondary | ICD-10-CM

## 2013-10-14 DIAGNOSIS — J9620 Acute and chronic respiratory failure, unspecified whether with hypoxia or hypercapnia: Secondary | ICD-10-CM | POA: Diagnosis present

## 2013-10-14 DIAGNOSIS — T83511S Infection and inflammatory reaction due to indwelling urethral catheter, sequela: Secondary | ICD-10-CM

## 2013-10-14 DIAGNOSIS — I509 Heart failure, unspecified: Secondary | ICD-10-CM | POA: Diagnosis present

## 2013-10-14 DIAGNOSIS — I428 Other cardiomyopathies: Secondary | ICD-10-CM | POA: Diagnosis present

## 2013-10-14 DIAGNOSIS — B961 Klebsiella pneumoniae [K. pneumoniae] as the cause of diseases classified elsewhere: Secondary | ICD-10-CM | POA: Diagnosis present

## 2013-10-14 DIAGNOSIS — J9690 Respiratory failure, unspecified, unspecified whether with hypoxia or hypercapnia: Secondary | ICD-10-CM | POA: Diagnosis present

## 2013-10-14 DIAGNOSIS — R6520 Severe sepsis without septic shock: Secondary | ICD-10-CM

## 2013-10-14 DIAGNOSIS — N390 Urinary tract infection, site not specified: Secondary | ICD-10-CM

## 2013-10-14 DIAGNOSIS — I69920 Aphasia following unspecified cerebrovascular disease: Secondary | ICD-10-CM

## 2013-10-14 DIAGNOSIS — Z6836 Body mass index (BMI) 36.0-36.9, adult: Secondary | ICD-10-CM

## 2013-10-14 DIAGNOSIS — G4733 Obstructive sleep apnea (adult) (pediatric): Secondary | ICD-10-CM | POA: Diagnosis present

## 2013-10-14 DIAGNOSIS — Z79899 Other long term (current) drug therapy: Secondary | ICD-10-CM

## 2013-10-14 DIAGNOSIS — Z86718 Personal history of other venous thrombosis and embolism: Secondary | ICD-10-CM

## 2013-10-14 DIAGNOSIS — Z931 Gastrostomy status: Secondary | ICD-10-CM

## 2013-10-14 LAB — PRO B NATRIURETIC PEPTIDE: PRO B NATRI PEPTIDE: 31836 pg/mL — AB (ref 0–450)

## 2013-10-14 LAB — URINALYSIS, ROUTINE W REFLEX MICROSCOPIC
Bilirubin Urine: NEGATIVE
Glucose, UA: NEGATIVE mg/dL
Ketones, ur: NEGATIVE mg/dL
NITRITE: NEGATIVE
PH: 5 (ref 5.0–8.0)
Protein, ur: >300 mg/dL — AB
SPECIFIC GRAVITY, URINE: 1.03 (ref 1.005–1.030)
Urobilinogen, UA: 2 mg/dL — ABNORMAL HIGH (ref 0.0–1.0)

## 2013-10-14 LAB — CARBOXYHEMOGLOBIN
Carboxyhemoglobin: 2.1 % — ABNORMAL HIGH (ref 0.5–1.5)
Methemoglobin: 0.5 % (ref 0.0–1.5)
O2 SAT: 73.7 %
Total hemoglobin: 9.1 g/dL — ABNORMAL LOW (ref 12.0–16.0)

## 2013-10-14 LAB — COMPREHENSIVE METABOLIC PANEL
ALBUMIN: 2.6 g/dL — AB (ref 3.5–5.2)
ALK PHOS: 137 U/L — AB (ref 39–117)
ALT: 23 U/L (ref 0–35)
AST: 43 U/L — ABNORMAL HIGH (ref 0–37)
Anion gap: 18 — ABNORMAL HIGH (ref 5–15)
BUN: 26 mg/dL — ABNORMAL HIGH (ref 6–23)
CO2: 23 mEq/L (ref 19–32)
Calcium: 8.5 mg/dL (ref 8.4–10.5)
Chloride: 96 mEq/L (ref 96–112)
Creatinine, Ser: 0.56 mg/dL (ref 0.50–1.10)
GFR calc non Af Amer: 83 mL/min — ABNORMAL LOW (ref >90)
GLUCOSE: 113 mg/dL — AB (ref 70–99)
POTASSIUM: 4.5 meq/L (ref 3.7–5.3)
SODIUM: 137 meq/L (ref 137–147)
Total Bilirubin: 2 mg/dL — ABNORMAL HIGH (ref 0.3–1.2)
Total Protein: 7.6 g/dL (ref 6.0–8.3)

## 2013-10-14 LAB — POCT I-STAT 3, ART BLOOD GAS (G3+)
ACID-BASE EXCESS: 1 mmol/L (ref 0.0–2.0)
Bicarbonate: 25.9 mEq/L — ABNORMAL HIGH (ref 20.0–24.0)
O2 Saturation: 100 %
PO2 ART: 390 mmHg — AB (ref 80.0–100.0)
TCO2: 27 mmol/L (ref 0–100)
pCO2 arterial: 44.9 mmHg (ref 35.0–45.0)
pH, Arterial: 7.376 (ref 7.350–7.450)

## 2013-10-14 LAB — GLUCOSE, CAPILLARY
Glucose-Capillary: 174 mg/dL — ABNORMAL HIGH (ref 70–99)
Glucose-Capillary: 175 mg/dL — ABNORMAL HIGH (ref 70–99)
Glucose-Capillary: 196 mg/dL — ABNORMAL HIGH (ref 70–99)

## 2013-10-14 LAB — PHOSPHORUS: Phosphorus: 4.9 mg/dL — ABNORMAL HIGH (ref 2.3–4.6)

## 2013-10-14 LAB — URINE MICROSCOPIC-ADD ON

## 2013-10-14 LAB — LACTIC ACID, PLASMA: LACTIC ACID, VENOUS: 2.9 mmol/L — AB (ref 0.5–2.2)

## 2013-10-14 LAB — TROPONIN I: Troponin I: <0.3 ng/mL (ref <0.30)

## 2013-10-14 LAB — CBC
HCT: 33.5 % — ABNORMAL LOW (ref 36.0–46.0)
HEMOGLOBIN: 9.9 g/dL — AB (ref 12.0–15.0)
MCH: 23.3 pg — ABNORMAL LOW (ref 26.0–34.0)
MCHC: 29.6 g/dL — ABNORMAL LOW (ref 30.0–36.0)
MCV: 79 fL (ref 78.0–100.0)
Platelets: 178 10*3/uL (ref 150–400)
RBC: 4.24 MIL/uL (ref 3.87–5.11)
RDW: 18.4 % — ABNORMAL HIGH (ref 11.5–15.5)
WBC: 14.5 10*3/uL — AB (ref 4.0–10.5)

## 2013-10-14 LAB — HEMOGLOBIN A1C
HEMOGLOBIN A1C: 6.5 % — AB (ref <5.7)
MEAN PLASMA GLUCOSE: 140 mg/dL — AB (ref <117)

## 2013-10-14 LAB — APTT: aPTT: 21 seconds — ABNORMAL LOW (ref 24–37)

## 2013-10-14 LAB — PROTIME-INR
INR: 1.31 (ref 0.00–1.49)
PROTHROMBIN TIME: 16.3 s — AB (ref 11.6–15.2)

## 2013-10-14 LAB — STREP PNEUMONIAE URINARY ANTIGEN: Strep Pneumo Urinary Antigen: NEGATIVE

## 2013-10-14 LAB — MRSA PCR SCREENING: MRSA BY PCR: POSITIVE — AB

## 2013-10-14 LAB — PROCALCITONIN: PROCALCITONIN: 0.5 ng/mL

## 2013-10-14 LAB — CLOSTRIDIUM DIFFICILE BY PCR: Toxigenic C. Difficile by PCR: NEGATIVE

## 2013-10-14 LAB — MAGNESIUM: Magnesium: 2.1 mg/dL (ref 1.5–2.5)

## 2013-10-14 MED ORDER — CIPROFLOXACIN IN D5W 400 MG/200ML IV SOLN
400.0000 mg | Freq: Two times a day (BID) | INTRAVENOUS | Status: DC
  Administered 2013-10-15 – 2013-10-17 (×5): 400 mg via INTRAVENOUS
  Filled 2013-10-14 (×6): qty 200

## 2013-10-14 MED ORDER — PANTOPRAZOLE SODIUM 40 MG IV SOLR
40.0000 mg | Freq: Every day | INTRAVENOUS | Status: DC
  Administered 2013-10-14: 40 mg via INTRAVENOUS
  Filled 2013-10-14 (×2): qty 40

## 2013-10-14 MED ORDER — MIDAZOLAM HCL 2 MG/2ML IJ SOLN
1.0000 mg | INTRAMUSCULAR | Status: DC | PRN
  Administered 2013-10-14 – 2013-10-16 (×6): 1 mg via INTRAVENOUS
  Filled 2013-10-14 (×6): qty 2

## 2013-10-14 MED ORDER — HEPARIN SODIUM (PORCINE) 5000 UNIT/ML IJ SOLN
5000.0000 [IU] | Freq: Three times a day (TID) | INTRAMUSCULAR | Status: DC
  Administered 2013-10-14 – 2013-10-17 (×10): 5000 [IU] via SUBCUTANEOUS
  Filled 2013-10-14 (×12): qty 1

## 2013-10-14 MED ORDER — MIDAZOLAM HCL 2 MG/2ML IJ SOLN
1.0000 mg | INTRAMUSCULAR | Status: DC | PRN
  Administered 2013-10-16: 1 mg via INTRAVENOUS
  Filled 2013-10-14: qty 2

## 2013-10-14 MED ORDER — CIPROFLOXACIN IN D5W 400 MG/200ML IV SOLN
400.0000 mg | Freq: Once | INTRAVENOUS | Status: AC
  Administered 2013-10-14: 400 mg via INTRAVENOUS
  Filled 2013-10-14: qty 200

## 2013-10-14 MED ORDER — SODIUM CHLORIDE 0.9 % IV SOLN
1250.0000 mg | Freq: Once | INTRAVENOUS | Status: AC
  Administered 2013-10-14: 1250 mg via INTRAVENOUS
  Filled 2013-10-14: qty 1250

## 2013-10-14 MED ORDER — INSULIN ASPART 100 UNIT/ML ~~LOC~~ SOLN
0.0000 [IU] | SUBCUTANEOUS | Status: DC
Start: 2013-10-14 — End: 2013-10-17
  Administered 2013-10-14 – 2013-10-15 (×3): 2 [IU] via SUBCUTANEOUS
  Administered 2013-10-15: 3 [IU] via SUBCUTANEOUS
  Administered 2013-10-15: 1 [IU] via SUBCUTANEOUS
  Administered 2013-10-15: 3 [IU] via SUBCUTANEOUS
  Administered 2013-10-15 (×2): 2 [IU] via SUBCUTANEOUS
  Administered 2013-10-16 (×2): 1 [IU] via SUBCUTANEOUS
  Administered 2013-10-16: 2 [IU] via SUBCUTANEOUS
  Administered 2013-10-16 – 2013-10-17 (×7): 1 [IU] via SUBCUTANEOUS

## 2013-10-14 MED ORDER — IPRATROPIUM-ALBUTEROL 0.5-2.5 (3) MG/3ML IN SOLN
3.0000 mL | Freq: Four times a day (QID) | RESPIRATORY_TRACT | Status: DC
  Administered 2013-10-14 – 2013-10-17 (×13): 3 mL via RESPIRATORY_TRACT
  Filled 2013-10-14 (×13): qty 3

## 2013-10-14 MED ORDER — BIOTENE DRY MOUTH MT LIQD
15.0000 mL | Freq: Four times a day (QID) | OROMUCOSAL | Status: DC
  Administered 2013-10-14 – 2013-10-17 (×13): 15 mL via OROMUCOSAL

## 2013-10-14 MED ORDER — SODIUM CHLORIDE 0.9 % IV SOLN
INTRAVENOUS | Status: DC
Start: 2013-10-14 — End: 2013-10-17
  Administered 2013-10-14 – 2013-10-17 (×3): via INTRAVENOUS

## 2013-10-14 MED ORDER — PIPERACILLIN-TAZOBACTAM 3.375 G IVPB 30 MIN
3.3750 g | Freq: Once | INTRAVENOUS | Status: AC
  Administered 2013-10-14: 3.375 g via INTRAVENOUS
  Filled 2013-10-14: qty 50

## 2013-10-14 MED ORDER — MUPIROCIN 2 % EX OINT
1.0000 "application " | TOPICAL_OINTMENT | Freq: Two times a day (BID) | CUTANEOUS | Status: DC
  Administered 2013-10-14 – 2013-10-17 (×7): 1 via NASAL
  Filled 2013-10-14 (×2): qty 22

## 2013-10-14 MED ORDER — VITAL HIGH PROTEIN PO LIQD
1000.0000 mL | ORAL | Status: DC
  Administered 2013-10-14 – 2013-10-17 (×4): 1000 mL
  Filled 2013-10-14 (×7): qty 1000

## 2013-10-14 MED ORDER — SODIUM CHLORIDE 0.9 % IV BOLUS (SEPSIS): 500.0000 mL | INTRAVENOUS | Status: DC | PRN

## 2013-10-14 MED ORDER — CHLORHEXIDINE GLUCONATE CLOTH 2 % EX PADS
6.0000 | MEDICATED_PAD | Freq: Every day | CUTANEOUS | Status: DC
  Administered 2013-10-14 – 2013-10-17 (×4): 6 via TOPICAL

## 2013-10-14 MED ORDER — NOREPINEPHRINE BITARTRATE 1 MG/ML IV SOLN
2.0000 ug/min | INTRAVENOUS | Status: DC
  Administered 2013-10-14 – 2013-10-15 (×3): 5 ug/min via INTRAVENOUS
  Filled 2013-10-14 (×5): qty 4

## 2013-10-14 MED ORDER — SODIUM CHLORIDE 0.9 % IV SOLN
1250.0000 mg | INTRAVENOUS | Status: DC
  Administered 2013-10-15 – 2013-10-16 (×2): 1250 mg via INTRAVENOUS
  Filled 2013-10-14 (×2): qty 1250

## 2013-10-14 MED ORDER — PIPERACILLIN-TAZOBACTAM 3.375 G IVPB
3.3750 g | Freq: Three times a day (TID) | INTRAVENOUS | Status: DC
  Administered 2013-10-14 – 2013-10-17 (×8): 3.375 g via INTRAVENOUS
  Filled 2013-10-14 (×10): qty 50

## 2013-10-14 MED ORDER — METRONIDAZOLE IN NACL 5-0.79 MG/ML-% IV SOLN
500.0000 mg | Freq: Three times a day (TID) | INTRAVENOUS | Status: DC
  Administered 2013-10-14: 500 mg via INTRAVENOUS
  Filled 2013-10-14 (×2): qty 100

## 2013-10-14 MED ORDER — FENTANYL CITRATE 0.05 MG/ML IJ SOLN
50.0000 ug | INTRAMUSCULAR | Status: DC | PRN
  Administered 2013-10-14 – 2013-10-17 (×16): 50 ug via INTRAVENOUS
  Filled 2013-10-14 (×16): qty 2

## 2013-10-14 MED ORDER — CHLORHEXIDINE GLUCONATE 0.12 % MT SOLN
15.0000 mL | Freq: Two times a day (BID) | OROMUCOSAL | Status: DC
  Administered 2013-10-14 – 2013-10-17 (×6): 15 mL via OROMUCOSAL
  Filled 2013-10-14 (×7): qty 15

## 2013-10-14 NOTE — H&P (Signed)
PULMONARY / CRITICAL CARE MEDICINE   Name: Jenny Brady MRN: [id number] DOB: November 07, 1925    ADMISSION DATE:  10/14/2013   REFERRING MD : Lemar Lofty PRIMARY SERVICE: PCCM  CHIEF COMPLAINT: Hypoxia  BRIEF PATIENT DESCRIPTION:  80 yo WF with an extensive PMH who was discharged from [facility] 12/12/12 to [facility] as a tracheostomy/vent dependent, post cva, severe dementia and returns to [facility] 10/14/13 with severe hypoxic resp failure refractory to current ventilation stratgies available at [facility] and is transferred back to [facility].  SIGNIFICANT EVENTS / STUDIES:  7/7 tx to [facility] from [facility]  LINES / TUBES: Trach>>  CULTURES: 7/7 bc x 2>> 7/7 uc>> 7/7 sputum>>  ANTIBIOTICS: 7/7 vanc>> 7/7 pip tazo>> 7/7 cipro >>   HISTORY OF PRESENT ILLNESS:  80 yo WF with an extensive PMH who was discharged from [facility] 12/12/12 to [facility] as a tracheostomy/vent dependent, post cva, severe dementia and returns to [facility] 10/14/13 with severe hypoxic resp failure refractory to current ventilation stratgies available at [facility] and is transferred back to [facility].  PAST MEDICAL HISTORY :  Past Medical History  Diagnosis Date  . Hyperthyroidism     had RAI-is on thyroid replacement, happened maybe this summer  . Coronary artery disease ?, before 2005    Had a catheterization per son by Dr. [name] in 1991 no reports in  [place].  note from 2005 mentions hx of coronary stent  . GERD (gastroesophageal reflux disease)   . Stroke 07/2012    Right hemiplegia, dysphagia, aphasia.  . Dementia   . Aphasia 07/2012  . Chronic systolic heart failure     07/2012 the EF was 15%  . Mural thrombus of cardiac apex   . Obesity   . IBS (irritable bowel syndrome)   . DVT (deep venous thrombosis) before 2005    occurred after trauma, treated for a while with Coumadin.   Marland Kitchen Dysphagia due to recent stroke 07/2012    ok'd for purree/pudding thick liquids but G tube placed by IR  . OSA (obstructive  sleep apnea)     refused CPAP per notes.    Past Surgical History  Procedure Laterality Date  . Tee without cardioversion N/A 07/26/2012    Procedure: TRANSESOPHAGEAL ECHOCARDIOGRAM (TEE);  Surgeon: Laurey Morale, MD;  Location: [facility] ENDOSCOPY;  Service: Cardiovascular;  Laterality: N/A;  . Gastric feeding tube  08/02/2012    placed in radiology   Prior to Admission medications   Medication Sig Start Date End Date Taking? Authorizing Provider  acetaminophen (TYLENOL) 160 MG/5ML solution Place 20.3 mLs (650 mg total) into feeding tube every 4 (four) hours as needed for fever. 12/12/12   Bernadene Person, NP  amoxicillin (AMOXIL) 875 MG tablet Take 875 mg by mouth 2 (two) times daily. For 14 days beginning 07/03/13 UTI    Historical Provider, MD  chlorhexidine (PERIDEX) 0.12 % solution Use as directed 15 mLs in the mouth or throat 2 (two) times daily. 12/12/12   Bernadene Person, NP  cholecalciferol (VITAMIN D) 1000 UNITS tablet Take 2 tablets (2,000 Units total) by mouth daily. 12/12/12   Bernadene Person, NP  cyanocobalamin 500 MCG tablet Place 1 tablet (500 mcg total) into feeding tube daily. 12/12/12   Bernadene Person, NP  feeding supplement (PRO-STAT SUGAR FREE 64) LIQD Place 30 mLs into feeding tube 2 (two) times daily. 12/12/12   Bernadene Person, NP  ipratropium-albuterol (DUONEB) 0.5-2.5 (3) MG/3ML SOLN Take 3 mLs by nebulization  every 6 (six) hours.    Historical Provider, MD  levothyroxine (SYNTHROID, LEVOTHROID) 100 MCG tablet Take 100 mcg by mouth daily before breakfast.    Historical Provider, MD  losartan (COZAAR) 25 MG tablet Give 12.5 mg by tube daily.    Historical Provider, MD  memantine (NAMENDA) 10 MG tablet Give 10 mg by tube 2 (two) times daily.    Historical Provider, MD  Multiple Vitamin (MULTIVITAMIN WITH MINERALS) TABS tablet Give 1 tablet by tube daily.    Historical Provider, MD  Nutritional Supplements (FEEDING SUPPLEMENT, JEVITY 1.2 CAL,) LIQD Place 1,000  mLs into feeding tube daily. 12/12/12   Bernadene PersonKathryn A Whiteheart, NP  nystatin (MYCOSTATIN/NYSTOP) 100000 UNIT/GM POWD Apply 1 g topically 2 (two) times daily. Apply to groin and abdomenal folds    Historical Provider, MD  polyvinyl alcohol (LIQUIFILM TEARS) 1.4 % ophthalmic solution Place 1 drop into both eyes 4 (four) times daily. 12/12/12   Bernadene PersonKathryn A Whiteheart, NP   Allergies  Allergen Reactions  . Hyoscyamine Other (See Comments)    Per MAR  . Ivp Dye [Iodinated Diagnostic Agents] Other (See Comments)    Per MAR  . Septra [Sulfamethoxazole-Trimethoprim] Other (See Comments)    Per MAR    FAMILY HISTORY:  Family History  Problem Relation Age of Onset  . Acne Mother     died giving birth to 8th kid  . Dementia Father     died [id number]  . Acne Brother     died 2-3 yrs ago  . Cervical cancer Sister    SOCIAL HISTORY:  reports that she has never smoked. She has never used smokeless tobacco. She reports that she does not drink alcohol or use illicit drugs.  REVIEW OF SYSTEMS:  na  SUBJECTIVE:   VITAL SIGNS:   HEMODYNAMICS:   VENTILATOR SETTINGS:   INTAKE / OUTPUT: Intake/Output   None     PHYSICAL EXAMINATION: General: Obese , elderly , WF in resp distress. Neuro:  No follows commands, opens eyes but no track or follow HEENT:Trach shiley #6 xlt in palce Cardiovascular:  HSR RRR Lungs:  Coarse rhonchi bilat Abdomen:  Peg in place Musculoskeletal:  All extremities cool and edematous Skin:  ++edema, cool ext, toes , fingers cyanotic.  LABS:  CBC No results found for this basename: WBC, HGB, HCT, PLT,  in the last 168 hours Coag's No results found for this basename: APTT, INR,  in the last 168 hours BMET No results found for this basename: NA, K, CL, CO2, BUN, CREATININE, GLUCOSE,  in the last 168 hours Electrolytes No results found for this basename: CALCIUM, MG, PHOS,  in the last 168 hours Sepsis Markers No results found for this basename: LATICACIDVEN, PROCALCITON,  O2SATVEN,  in the last 168 hours ABG No results found for this basename: PHART, PCO2ART, PO2ART,  in the last 168 hours Liver Enzymes No results found for this basename: AST, ALT, ALKPHOS, BILITOT, ALBUMIN,  in the last 168 hours Cardiac Enzymes No results found for this basename: TROPONINI, PROBNP,  in the last 168 hours Glucose No results found for this basename: GLUCAP,  in the last 168 hours  Imaging No results found.   CXR:   ASSESSMENT / PLAN:  PULMONARY A: Acute on chronic resp failure due to HCAP, complicated by CHF,  Chronic respiratory failure: as I understand it from notes, she remains vent dependent P:   Vent stabilization protocol Check abg/CXR BD Abx > see ID  CARDIOVASCULAR A:  Shock >  presumably septic CAD CHF ef 15% 09/2012 P:  12 lead CE for completeness May need pressors May be sepsis, will check markers Coox, CVP Consider echo Check cortisol, lactic acid, proBNP  RENAL A:  No acute issue P:   Check BMET  GASTROINTESTINAL A:  Dysphagia Chronic peg P:   Tube feeds Pantoprazole for stress ulcer prophylaxis  HEMATOLOGIC A:   Chronic anemia P:  Follow h/h  INFECTIOUS A:   Presumed HCAP P:   See flows but aggressive abx along with c diff coverage  ENDOCRINE A:   DM P:   SSI  NEUROLOGIC A:   Dementia CVA AMS Pain P:   RASS goal: 0-1 Fet/versed drip for pain/sedation No need for CT head at this time  Goals of care : This lady has lived in [facility] for over 6 months with very little progress with vent weaning after a large stroke in 2014.  It does not appear that we are providing medically beneficial care at this point.  Pursue goal of comfort care if possible  Code status: Limited code blue, no CPR or shocks full support otherwise  Family: updated Niece Lynnell Chakrabarti 7/7 at noon  TODAY'S SUMMARY:  80 yo WF with an extensive PMH who was discharged from [facility] 12/12/12 to [facility] as a tracheostomy/vent dependent,  post cva, severe dementia and returns to [facility] 10/14/13 with severe hypoxic resp failure refractory to current ventilation stratgies available at [facility] and is transferred back to [facility].  Brett Canales Minor ACNP Adolph Pollack PCCM Pager [phone] till 3 pm If no answer page [phone] 10/14/2013, 10:26 AM  Attending:  I have seen and examined the patient with nurse practitioner/resident and agree with and have edited the note above.    I have personally obtained a history, examined the patient, evaluated laboratory and imaging results, formulated the assessment and plan and placed orders. CRITICAL CARE: The patient is critically ill with multiple organ systems failure and requires high complexity decision making for assessment and support, frequent evaluation and titration of therapies, application of advanced monitoring technologies and extensive interpretation of multiple databases. Critical Care Time devoted to patient care services described in this note is 60 minutes.   Heber Summerside, MD [place] PCCM Pager: [phone] Cell: [phone] If no response, call [phone]   10/14/2013, 10:26 AM

## 2013-10-14 NOTE — Procedures (Signed)
Central Venous Catheter Insertion Procedure Note Jenny Brady [id number] 03-14-1926  Procedure: Insertion of Central Venous Catheter Indications: Assessment of intravascular volume, Drug and/or fluid administration and Frequent blood sampling  Procedure Details Consent: Unable to obtain consent because of emergent medical necessity. Time Out: Verified patient identification, verified procedure, site/side was marked, verified correct patient position, special equipment/implants available, medications/allergies/relevent history reviewed, required imaging and test results available.  Performed  Maximum sterile technique was used including antiseptics, cap, gloves, gown, hand hygiene, mask and sheet. Skin prep: Chlorhexidine; local anesthetic administered A antimicrobial bonded/coated triple lumen catheter was placed in the left subclavian vein using the Seldinger technique. Ultrasound guidance used.Yes.   Catheter placed to 20 cm. Blood aspirated via all 3 ports and then flushed x 3. Line sutured x 2 and dressing applied.  Evaluation Blood flow good Complications: No apparent complications Patient did tolerate procedure well. Chest X-ray ordered to verify placement.  CXR: pending.  Brett Canales Minor ACNP Adolph Pollack PCCM Pager [phone] till 3 pm If no answer page [phone] 10/14/2013, 11:39 AM   Attending  I supervised the procedure  Heber Stockholm, MD [place] PCCM Pager: [phone] Cell: [phone] If no response, call [phone]

## 2013-10-14 NOTE — Progress Notes (Addendum)
ANTIBIOTIC CONSULT NOTE - INITIAL  Pharmacy Consult:  Vancomycin / Zosyn / Cipro Indication:  Sepsis + HCAP  Allergies  Allergen Reactions  . Hyoscyamine Other (See Comments)    Per MAR  . Ivp Dye [Iodinated Diagnostic Agents] Other (See Comments)    Per MAR  . Septra [Sulfamethoxazole-Trimethoprim] Other (See Comments)    Per [facility]    Patient Measurements: Height: 5' 1.02" (155 cm) Weight: 171 lb 8.3 oz (77.8 kg) IBW/kg (Calculated) : 47.86  Vital Signs: Temp: 101 F (38.3 C) (07/07 1030) Temp src: Core (Comment) (07/07 1030) BP: 103/22 mmHg (07/07 1030) Pulse Rate: 118 (07/07 1030)  Labs: No results found for this basename: WBC, HGB, PLT, LABCREA, CREATININE,  in the last 72 hours Estimated Creatinine Clearance: 48.6 ml/min (by C-G formula based on Cr of 0.58). No results found for this basename: VANCOTROUGH, VANCOPEAK, VANCORANDOM, GENTTROUGH, GENTPEAK, GENTRANDOM, TOBRATROUGH, TOBRAPEAK, TOBRARND, AMIKACINPEAK, AMIKACINTROU, AMIKACIN,  in the last 72 hours   Microbiology: No results found for this or any previous visit (from the past 720 hour(s)).  Medical History: Past Medical History  Diagnosis Date  . Hyperthyroidism     had RAI-is on thyroid replacement, happened maybe this summer  . Coronary artery disease ?, before 2005    Had a catheterization per son by Dr. [name] in 1991 no reports in  [place].  note from 2005 mentions hx of coronary stent  . GERD (gastroesophageal reflux disease)   . Stroke 07/2012    Right hemiplegia, dysphagia, aphasia.  . Dementia   . Aphasia 07/2012  . Chronic systolic heart failure     07/2012 the EF was 15%  . Mural thrombus of cardiac apex   . Obesity   . IBS (irritable bowel syndrome)   . DVT (deep venous thrombosis) before 2005    occurred after trauma, treated for a while with Coumadin.   Marland Kitchen Dysphagia due to recent stroke 07/2012    ok'd for purree/pudding thick liquids but G tube placed by IR  . OSA (obstructive sleep  apnea)     refused CPAP per notes.       Assessment: 27 YOF transferred from [facility] with hypoxia.  Pharmacy consulted to manage vancomycin, Cipro and Zosyn for sepsis and HCAP.  Baseline labs reviewed.  Vanc 7/7 >> Zosyn 7/7 >> Cipro 7/7 >>  7/7 BCx x2 - 7/7 C.diff - negative 7/7 UCx -  7/7 MRSA PCR - positive   Goal of Therapy:  Vancomycin trough level 15-20 mcg/ml   Plan:  - Vanc 1250mg  IV x 1, then Q24H - Zosyn 3.375gm IV x1 over 30 min, then 3.375gm IV Q8H (4 hr infusion) - Cipro 400mg  IV Q12H - Monitor renal fxn, clinical progress, vanc trough at Css    Shenoa Hattabaugh D. Laney Potash, PharmD, BCPS Pager:  [phone] 10/14/2013, 1:34 PM

## 2013-10-14 NOTE — Progress Notes (Addendum)
INITIAL NUTRITION ASSESSMENT  DOCUMENTATION CODES Per approved criteria  -Obesity Unspecified   INTERVENTION:  Utilize 43M PEPuP Protocol: initiate TF via PEG with Vital High Protein at 25 ml/h on day 1; on day 2, increase to goal rate of 50 ml/h (1200 ml per day) to provide 1200 kcals (65% of estimated needs and 25 kcals/kg ideal weight), 105 gm protein, 1003 ml free water daily.  NUTRITION DIAGNOSIS: Inadequate oral intake related to inability to eat as evidenced by NPO status.   Goal: Enteral nutrition to provide 60-70% of estimated calorie needs (22-25 kcals/kg ideal body weight) and 100% of estimated protein needs, based on ASPEN guidelines for permissive underfeeding in critically ill obese individuals  Monitor:  TF tolerance/adequacy, weight trend, labs, vent status.  Reason for Assessment: MD Consult for TF initiation and management.  80 y.o. female  Admitting Dx: Hypoxia  ASSESSMENT: 80 yo WF with an extensive PMH who was discharged from [facility] 12/12/12 to [facility] as a tracheostomy/vent dependent, post cva, severe dementia and returns to [facility] 10/14/13 with severe hypoxic resp failure refractory to current ventilation stratgies available at [facility] and is transferred back to [facility].  Nutrition focused physical exam completed.  No muscle or subcutaneous fat depletion noticed. History of trach dependency and dysphagia. Patient has a PEG. Received TF via PEG PTA, unsure of usual regimen. RD to order TF.   Patient is currently intubated on ventilator support MV: 13.2 L/min Temp (24hrs), Avg:100.3 F (37.9 C), Min:98.9 F (37.2 C), Max:101 F (38.3 C)   Height: Ht Readings from Last 1 Encounters:  10/14/13 5' 1.02" (1.55 m)    Weight: Wt Readings from Last 1 Encounters:  10/14/13 171 lb 8.3 oz (77.8 kg)    Ideal Body Weight: 47.7 kg  % Ideal Body Weight: 163%  Wt Readings from Last 10 Encounters:  10/14/13 171 lb 8.3 oz (77.8 kg)  12/12/12 166 lb 0.1  oz (75.3 kg)  10/24/12 154 lb 8 oz (70.081 kg)  09/24/12 176 lb 9.4 oz (80.1 kg)  09/24/12 176 lb 9.4 oz (80.1 kg)  11/14/12 165 lb (74.844 kg)  08/15/12 164 lb (74.39 kg)  08/13/12 177 lb 4 oz (80.4 kg)  08/13/12 177 lb 4 oz (80.4 kg)  04/06/11 168 lb 10.4 oz (76.5 kg)    Usual Body Weight: 154 lb (1 year ago)  % Usual Body Weight: 111%  BMI:  Body mass index is 32.38 kg/(m^2). class 1 obesity  Estimated Nutritional Needs: Kcal: 1844 Protein: >/= 95 gm Fluid: 1.8-2 L  Skin: intact  Diet Order: NPO  EDUCATION NEEDS: -Education not appropriate at this time   Intake/Output Summary (Last 24 hours) at 10/14/13 1505 Last data filed at 10/14/13 1115  Gross per 24 hour  Intake    415 ml  Output      0 ml  Net    415 ml    Last BM: None documented since admission   Labs:   Recent Labs Lab 10/14/13 1014  NA 137  K 4.5  CL 96  CO2 23  BUN 26*  CREATININE 0.56  CALCIUM 8.5  MG 2.1  PHOS 4.9*  GLUCOSE 113*    CBG (last 3)   Recent Labs  10/14/13 1449  GLUCAP 174*    Scheduled Meds: . antiseptic oral rinse  15 mL Mouth Rinse QID  . chlorhexidine  15 mL Mouth Rinse BID  . Chlorhexidine Gluconate Cloth  6 each Topical Q0600  . [START ON 10/15/2013]  ciprofloxacin  400 mg Intravenous Q12H  . heparin  5,000 Units Subcutaneous 3 times per day  . insulin aspart  0-9 Units Subcutaneous 6 times per day  . ipratropium-albuterol  3 mL Nebulization Q6H  . mupirocin ointment  1 application Nasal BID  . pantoprazole (PROTONIX) IV  40 mg Intravenous QHS  . piperacillin-tazobactam (ZOSYN)  IV  3.375 g Intravenous Q8H  . [START ON 10/15/2013] vancomycin  1,250 mg Intravenous Q24H    Continuous Infusions: . sodium chloride 100 mL/hr at 10/14/13 1021  . norepinephrine (LEVOPHED) Adult infusion 5 mcg/min (10/14/13 1256)    Past Medical History  Diagnosis Date  . Hyperthyroidism     had RAI-is on thyroid replacement, happened maybe this summer  . Coronary artery  disease ?, before 2005    Had a catheterization per son by Dr. Glennon HamiltonJoe [place] in 1991 no reports in  [facility]-chart.  note from 2005 mentions hx of coronary stent  . GERD (gastroesophageal reflux disease)   . Stroke 07/2012    Right hemiplegia, dysphagia, aphasia.  . Dementia   . Aphasia 07/2012  . Chronic systolic heart failure     07/2012 the EF was 15%  . Mural thrombus of cardiac apex   . Obesity   . IBS (irritable bowel syndrome)   . DVT (deep venous thrombosis) before 2005    occurred after trauma, treated for a while with Coumadin.   Marland Kitchen. Dysphagia due to recent stroke 07/2012    ok'd for purree/pudding thick liquids but G tube placed by IR  . OSA (obstructive sleep apnea)     refused CPAP per notes.     Past Surgical History  Procedure Laterality Date  . Tee without cardioversion N/A 07/26/2012    Procedure: TRANSESOPHAGEAL ECHOCARDIOGRAM (TEE);  Surgeon: Laurey Moralealton S McLean, MD;  [facility];  Service: Cardiovascular;  Laterality: N/A;  . Gastric feeding tube  08/02/2012    placed in radiology    Joaquin CourtsKimberly Harris, RD, LDN, CNSC Pager [phone] After Hours Pager [phone]

## 2013-10-15 DIAGNOSIS — J9620 Acute and chronic respiratory failure, unspecified whether with hypoxia or hypercapnia: Secondary | ICD-10-CM

## 2013-10-15 DIAGNOSIS — R0902 Hypoxemia: Secondary | ICD-10-CM

## 2013-10-15 DIAGNOSIS — Z930 Tracheostomy status: Secondary | ICD-10-CM

## 2013-10-15 LAB — GLUCOSE, CAPILLARY
GLUCOSE-CAPILLARY: 131 mg/dL — AB (ref 70–99)
Glucose-Capillary: 159 mg/dL — ABNORMAL HIGH (ref 70–99)
Glucose-Capillary: 161 mg/dL — ABNORMAL HIGH (ref 70–99)
Glucose-Capillary: 167 mg/dL — ABNORMAL HIGH (ref 70–99)
Glucose-Capillary: 210 mg/dL — ABNORMAL HIGH (ref 70–99)
Glucose-Capillary: 216 mg/dL — ABNORMAL HIGH (ref 70–99)

## 2013-10-15 LAB — POCT I-STAT 3, ART BLOOD GAS (G3+)
Acid-Base Excess: 3 mmol/L — ABNORMAL HIGH (ref 0.0–2.0)
BICARBONATE: 26.3 meq/L — AB (ref 20.0–24.0)
O2 Saturation: 100 %
Patient temperature: 98.2
TCO2: 27 mmol/L (ref 0–100)
pCO2 arterial: 34.4 mmHg — ABNORMAL LOW (ref 35.0–45.0)
pH, Arterial: 7.491 — ABNORMAL HIGH (ref 7.350–7.450)
pO2, Arterial: 166 mmHg — ABNORMAL HIGH (ref 80.0–100.0)

## 2013-10-15 LAB — LEGIONELLA ANTIGEN, URINE: Legionella Antigen, Urine: NEGATIVE

## 2013-10-15 LAB — BASIC METABOLIC PANEL
Anion gap: 15 (ref 5–15)
BUN: 29 mg/dL — AB (ref 6–23)
CALCIUM: 8.5 mg/dL (ref 8.4–10.5)
CHLORIDE: 98 meq/L (ref 96–112)
CO2: 24 mEq/L (ref 19–32)
CREATININE: 0.56 mg/dL (ref 0.50–1.10)
GFR calc non Af Amer: 83 mL/min — ABNORMAL LOW (ref >90)
Glucose, Bld: 219 mg/dL — ABNORMAL HIGH (ref 70–99)
Potassium: 3.8 mEq/L (ref 3.7–5.3)
Sodium: 137 mEq/L (ref 137–147)

## 2013-10-15 LAB — CBC
HCT: 33.9 % — ABNORMAL LOW (ref 36.0–46.0)
Hemoglobin: 9.8 g/dL — ABNORMAL LOW (ref 12.0–15.0)
MCH: 23.2 pg — AB (ref 26.0–34.0)
MCHC: 28.9 g/dL — ABNORMAL LOW (ref 30.0–36.0)
MCV: 80.3 fL (ref 78.0–100.0)
Platelets: 194 10*3/uL (ref 150–400)
RBC: 4.22 MIL/uL (ref 3.87–5.11)
RDW: 19.2 % — AB (ref 11.5–15.5)
WBC: 15.2 10*3/uL — ABNORMAL HIGH (ref 4.0–10.5)

## 2013-10-15 LAB — CORTISOL: CORTISOL PLASMA: 72 ug/dL

## 2013-10-15 MED ORDER — NOREPINEPHRINE BITARTRATE 1 MG/ML IV SOLN: 2.0000 ug/min | INTRAVENOUS | Status: DC

## 2013-10-15 MED ORDER — VITAL HIGH PROTEIN PO LIQD: 1000.0000 mL | ORAL | Status: DC

## 2013-10-15 MED ORDER — FENTANYL CITRATE 0.05 MG/ML IJ SOLN: 50.0000 ug | INTRAMUSCULAR | Status: DC | PRN

## 2013-10-15 MED ORDER — SODIUM CHLORIDE 0.9 % IV SOLN: 100.0000 mL | INTRAVENOUS | Status: DC

## 2013-10-15 MED ORDER — MIDAZOLAM HCL 2 MG/2ML IJ SOLN: 1.0000 mg | INTRAMUSCULAR | Status: DC | PRN

## 2013-10-15 MED ORDER — CIPROFLOXACIN IN D5W 400 MG/200ML IV SOLN: 400.0000 mg | Freq: Two times a day (BID) | INTRAVENOUS | Status: DC

## 2013-10-15 MED ORDER — PANTOPRAZOLE SODIUM 40 MG PO PACK: 40.0000 mg | PACK | Freq: Every day | ORAL | Status: DC

## 2013-10-15 MED ORDER — PANTOPRAZOLE SODIUM 40 MG PO PACK
40.0000 mg | PACK | Freq: Every day | ORAL | Status: DC
  Administered 2013-10-15 – 2013-10-17 (×3): 40 mg
  Filled 2013-10-15 (×3): qty 20

## 2013-10-15 MED ORDER — HEPARIN SODIUM (PORCINE) 5000 UNIT/ML IJ SOLN: 5000.0000 [IU] | Freq: Three times a day (TID) | INTRAMUSCULAR | Status: AC

## 2013-10-15 MED ORDER — INSULIN ASPART 100 UNIT/ML ~~LOC~~ SOLN: 0.0000 [IU] | SUBCUTANEOUS | Status: DC

## 2013-10-15 MED ORDER — PIPERACILLIN-TAZOBACTAM 3.375 G IVPB: 3.3750 g | Freq: Three times a day (TID) | INTRAVENOUS | Status: DC

## 2013-10-15 MED ORDER — VANCOMYCIN HCL 10 G IV SOLR: 1250.0000 mg | INTRAVENOUS | Status: DC

## 2013-10-15 NOTE — Discharge Summary (Signed)
Physician Discharge Summary  Patient ID: Jenny Brady MRN: [id number] DOB/AGE: 05/19/1925 80 y.o.  Admit date: 10/14/2013 Discharge date: 10/15/2013  Problem List Active Problems:   CAD (?stent) cath 90's [place]   SYMPTOM, MEMORY LOSS   CKD (chronic kidney disease) stage 3, GFR 30-59 ml/min   Dysphagia, unspecified(787.20)   Acute respiratory failure with hypoxia   Anemia of chronic disease   Acute on chronic systolic heart failure   Acute-on-chronic respiratory failure   Respiratory failure  HPI: 80 yo WF with an extensive PMH who was discharged from [facility] 12/12/12 to [facility] as a tracheostomy/vent dependent, post cva, severe dementia and returns to [facility] 10/14/13 with severe hypoxic resp failure refractory to current ventilation stratgies available at [facility] and is transferred back to [facility].  Hospital Course: SIGNIFICANT EVENTS / STUDIES:  7/7 tx to [facility] from [facility]  LINES / TUBES:  Trach>>   CULTURES:  7/7 bc x 2>>  7/7 uc>>  7/7 sputum>>   ANTIBIOTICS:  7/7 vanc>>  7/7 pip tazo>>  7/7 cipro >>     ASSESSMENT / PLAN:  PULMONARY  A:  Acute on chronic resp failure due to HCAP, complicated by CHF,  Chronic respiratory failure: as I understand it from notes, she remains vent dependent  P:  Vent stabilization protocol. 8 cc per kg Check abg/CXR  BD  Abx > see ID   CARDIOVASCULAR  A:  Shock > presumably septic  CAD  CHF ef 15% 09/2012  P:  12 lead  CE for completeness , tropi negative  pressors , on levophed at time of DC. CVP 17(last 2 d echo 2014 with EF 15%)   sepsis, procal .5, CVP 17 Consider echo although may not change treatment regime.   Check cortisol 72, lactic acid  2.9, proBNP 31836  RENAL  Lab Results  Component Value Date   CREATININE 0.56 10/15/2013   CREATININE 0.56 10/14/2013   CREATININE 0.58 07/13/2013    A:  No acute issue  P:  Follow BMET   GASTROINTESTINAL  A:  Dysphagia  Chronic peg  P:  Tube feeds   Pantoprazole for stress ulcer prophylaxis   HEMATOLOGIC   Recent Labs  10/14/13 1014 10/15/13 0430  HGB 9.9* 9.8*    A:  Chronic anemia  P:  Follow h/h  INFECTIOUS  A:  Presumed HCAP  P:  See flows but aggressive abx along with c diff coverage   ENDOCRINE  A:  DM  P:  SSI   NEUROLOGIC  A:  Dementia  CVA  AMS  Pain  P:  RASS goal: 0-1  Fet/versed drip for pain/sedation  No need for CT head at this time  Goals of care : This lady has lived in [facility] for over 6 months with very little progress with vent weaning after a large stroke in 2014. It does not appear that we are providing medically beneficial care at this point.   Pursue goal of comfort care if possible  Code status: Limited code blue, no CPR or shocks full support otherwise   7/8 Transfer back to [facility] hospital acute care side. Remains on ARDS /sepsis protocols.    Labs at discharge Lab Results  Component Value Date   CREATININE 0.56 10/15/2013   BUN 29* 10/15/2013   NA 137 10/15/2013   K 3.8 10/15/2013   CL 98 10/15/2013   CO2 24 10/15/2013   Lab Results  Component Value Date   WBC 15.2* 10/15/2013  HGB 9.8* 10/15/2013   HCT 33.9* 10/15/2013   MCV 80.3 10/15/2013   PLT 194 10/15/2013   Lab Results  Component Value Date   ALT 23 10/14/2013   AST 43* 10/14/2013   ALKPHOS 137* 10/14/2013   BILITOT 2.0* 10/14/2013   Lab Results  Component Value Date   INR 1.31 10/14/2013   INR 1.10 11/21/2012   INR 1.20 10/18/2012   ABG    Component Value Date/Time   PHART 7.491* 10/15/2013 0601   PCO2ART 34.4* 10/15/2013 0601   PO2ART 166.0* 10/15/2013 0601   HCO3 26.3* 10/15/2013 0601   TCO2 27 10/15/2013 0601   O2SAT 100.0 10/15/2013 0601    Current radiology studies Dg Chest Port 1 View  10/14/2013   CLINICAL DATA:  Central line placement.  EXAM: PORTABLE CHEST - 1 VIEW  COMPARISON:  10/14/2013.  FINDINGS: Interim placement central line, its tip is projected superior vena cava. Tracheostomy tube in stable position. Dense  bilateral pulmonary infiltrates are present. Cardiomegaly is present. Pleural effusions present. No pneumothorax. No acute osseous abnormality.  IMPRESSION: 1. Interval placement of left central line, tip is in good anatomic position. Tracheostomy tube in stable position. 2. Persistent dense bilateral pulmonary infiltrates and cardiomegaly. Bilateral pleural effusions are again noted.   Electronically Signed   By: Maisie Fus  Register   On: 10/14/2013 12:39   Dg Chest Port 1 View  10/14/2013   CLINICAL DATA:  Respiratory failure  EXAM: PORTABLE CHEST - 1 VIEW  COMPARISON:  July 13, 2013  FINDINGS: Tracheostomy tube tip is 4.9 cm above the carina. No pneumothorax. There is consolidation in the left lower lobe as well as more patchy infiltrate in both upper lobes. There is a small right effusion. Heart is mildly enlarged with normal pulmonary vascularity.  IMPRESSION: Areas of infiltrate bilaterally with consolidation in the left lower lobe, essentially stable. Given the persistence of these findings over a 3 month time span, it may be reasonable to consider chest CT and/or bronchoscopy to further assess. Small right effusion. Underlying emphysema. No pneumothorax.   Electronically Signed   By: Bretta Bang M.D.   On: 10/14/2013 10:35    Disposition:  [facility]     Medication List    STOP taking these medications       amoxicillin 875 MG tablet  Commonly known as:  AMOXIL     losartan 25 MG tablet  Commonly known as:  COZAAR     memantine 10 MG tablet  Commonly known as:  NAMENDA      TAKE these medications       acetaminophen 160 MG/5ML solution  Commonly known as:  TYLENOL  Place 20.3 mLs (650 mg total) into feeding tube every 4 (four) hours as needed for fever.     chlorhexidine 0.12 % solution  Commonly known as:  PERIDEX  Use as directed 15 mLs in the mouth or throat 2 (two) times daily.     cholecalciferol 1000 UNITS tablet  Commonly known as:  VITAMIN D  Take 2  tablets (2,000 Units total) by mouth daily.     ciprofloxacin 400 MG/200ML Soln  Commonly known as:  CIPRO  Inject 200 mLs (400 mg total) into the vein every 12 (twelve) hours.     cyanocobalamin 500 MCG tablet  Place 1 tablet (500 mcg total) into feeding tube daily.     feeding supplement (PRO-STAT SUGAR FREE 64) Liqd  Place 30 mLs into feeding tube 2 (two) times daily.  feeding supplement (VITAL HIGH PROTEIN) Liqd liquid  Place 1,000 mLs into feeding tube daily.     fentaNYL 0.05 MG/ML injection  Commonly known as:  SUBLIMAZE  Inject 1 mL (50 mcg total) into the vein every 2 (two) hours as needed for severe pain.     heparin 5000 UNIT/ML injection  Inject 1 mL (5,000 Units total) into the skin every 8 (eight) hours.     insulin aspart 100 UNIT/ML injection  Commonly known as:  novoLOG  Inject 0-9 Units into the skin every 4 (four) hours.     ipratropium-albuterol 0.5-2.5 (3) MG/3ML Soln  Commonly known as:  DUONEB  Take 3 mLs by nebulization every 6 (six) hours.     levothyroxine 100 MCG tablet  Commonly known as:  SYNTHROID, LEVOTHROID  Take 100 mcg by mouth daily before breakfast.     midazolam 2 MG/2ML Soln injection  Commonly known as:  VERSED  Inject 1 mL (1 mg total) into the vein every 15 (fifteen) minutes as needed for agitation (to achieve RASS goal).     multivitamin with minerals Tabs tablet  Give 1 tablet by tube daily.     norepinephrine 4 mg in dextrose 5 % 250 mL  Inject 2-20 mcg/min into the vein continuous.     nystatin 100000 UNIT/GM Powd  Apply 1 g topically 2 (two) times daily. Apply to groin and abdomenal folds     pantoprazole sodium 40 mg/20 mL Pack  Commonly known as:  PROTONIX  Place 20 mLs (40 mg total) into feeding tube daily.     piperacillin-tazobactam 3.375 GM/50ML IVPB  Commonly known as:  ZOSYN  Inject 50 mLs (3.375 g total) into the vein every 8 (eight) hours.     polyvinyl alcohol 1.4 % ophthalmic solution  Commonly known  as:  LIQUIFILM TEARS  Place 1 drop into both eyes 4 (four) times daily.     sodium chloride 0.9 % infusion  Inject 100 mLs into the vein continuous.     vancomycin 1,250 mg in sodium chloride 0.9 % 250 mL  Inject 1,250 mg into the vein daily.        Vent Mode:  [-] PRVC FiO2 (%):  [40 %-50 %] 40 % Set Rate:  [26 bmp-34 bmp] 26 bmp Vt Set:  [380 mL] 380 mL PEEP:  [5 cmH20] 5 cmH20 Plateau Pressure:  [18 cmH20-22 cmH20] 21 cmH20   Discharged Condition: poor  Time spent on discharge greater than 40 minutes.  Vital signs at Discharge. Temp:  [97.3 F (36.3 C)-99.4 F (37.4 C)] 97.3 F (36.3 C) (07/08 1224) Pulse Rate:  [45-87] 69 (07/08 1224) Resp:  [12-37] 26 (07/08 1224) BP: (91-114)/(49-67) 97/52 mmHg (07/08 1224) SpO2:  [97 %-100 %] 100 % (07/08 1224) FiO2 (%):  [40 %-50 %] 40 % (07/08 1225) Weight:  [178 lb 12.7 oz (81.1 kg)] 178 lb 12.7 oz (81.1 kg) (07/08 0200) Office follow up Special Information or instructions. Per [facility]. Signed: Brett CanalesSteve Minor ACNP [facility] PCCM Pager [phone] till 3 pm If no answer page [phone] 10/15/2013, 1:13 PM  PCCM ATTENDING: Pt is stable for DC back to [facility], LTACH side. However, family is reluctant as they were dissatisfied with the care @ [facility]. This note serves as today's progress note. 30 mins CCM time provided. I will meet with family tomorrow to discuss options.   Billy Fischeravid Aiyden Lauderback, MD ; [facility] service Mobile [phone].  After 5:30 PM or weekends, call [phone]

## 2013-10-15 NOTE — Clinical Social Work Psychosocial (Signed)
Clinical Social Work Department BRIEF PSYCHOSOCIAL ASSESSMENT 10/15/2013  Patient:  Jenny Brady, Jenny Brady     Account Number:  [id number]     Admit date:  10/14/2013  Clinical Social Worker:  Read Drivers  Date/Time:  10/15/2013 02:21 PM  Referred by:  Physician  Date Referred:  10/15/2013 Referred for  Psychosocial assessment  SNF Placement   Other Referral:   none   Interview type:  Other - See comment Other interview type:   Pt POA [name] and Guardian of assets, Marisue Ivan- both neices    PSYCHOSOCIAL DATA Living Status:  FACILITY Admitted from facility:  Other Level of care:  Skilled Nursing Facility Primary support name:  [name] and Marisue Ivan Primary support relationship to patient:  FAMILY Degree of support available:   adequate  Pt is from [facility] SN[facility]    CURRENT CONCERNS Current Concerns  Post-Acute Placement   Other Concerns:    SOCIAL WORK ASSESSMENT / PLAN Pt is chronically vent dependent.  CSW spoke with Marisue Ivan, guardian of pt assets Gillan Ann-HCPOA was tied up with a pt at work).  Pt is LTACH candidate.  CSW explored long term SNF/Vent options with Marisue Ivan.  Marisue Ivan was given [facility] #, but states it is too far for the family.  Pt feels that [facility] vent/SNF may be the best fit for location purposes, but feel that the facility has "given up" on the pt.  CSW provided comfort and support.   Assessment/plan status:  Psychosocial Support/Ongoing Assessment of Needs Other assessment/ plan:   none   Information/referral to community resources:   [facility]  RNCM    PATIENT'S/FAMILY'S RESPONSE TO PLAN OF CARE: Pt guardians appreciative of CSW assistance and support. Guardians both agreeable to [facility] disposition.       Vickii Penna, LCSWA [phone]  Clinical Social Work

## 2013-10-15 NOTE — Progress Notes (Signed)
CRITICAL VALUE ALERT  Critical value received:  MRSA +  Date of notification:  10/14/2013  Time of notification:  1225  Critical value read back:Yes.    Nurse who received alert:  Gerre Pebbles  MD notified (1st page):  Dr Sung Amabile  Time of first page:  1225  MD notified (2nd page):  Time of second page:  Responding MD:  Dr Sung Amabile at bedside/Protocol initiated  Time MD responded:  1225

## 2013-10-16 DIAGNOSIS — I428 Other cardiomyopathies: Secondary | ICD-10-CM

## 2013-10-16 DIAGNOSIS — R6521 Severe sepsis with septic shock: Secondary | ICD-10-CM

## 2013-10-16 DIAGNOSIS — A419 Sepsis, unspecified organism: Secondary | ICD-10-CM

## 2013-10-16 DIAGNOSIS — N390 Urinary tract infection, site not specified: Secondary | ICD-10-CM

## 2013-10-16 DIAGNOSIS — I429 Cardiomyopathy, unspecified: Secondary | ICD-10-CM

## 2013-10-16 LAB — CULTURE, RESPIRATORY W GRAM STAIN

## 2013-10-16 LAB — GLUCOSE, CAPILLARY
GLUCOSE-CAPILLARY: 129 mg/dL — AB (ref 70–99)
GLUCOSE-CAPILLARY: 150 mg/dL — AB (ref 70–99)
Glucose-Capillary: 117 mg/dL — ABNORMAL HIGH (ref 70–99)
Glucose-Capillary: 146 mg/dL — ABNORMAL HIGH (ref 70–99)
Glucose-Capillary: 148 mg/dL — ABNORMAL HIGH (ref 70–99)
Glucose-Capillary: 170 mg/dL — ABNORMAL HIGH (ref 70–99)

## 2013-10-16 LAB — CULTURE, RESPIRATORY

## 2013-10-16 NOTE — Progress Notes (Signed)
Physician Discharge Summary  Patient ID: Jenny Brady MRN: [id number] DOB/AGE: [birth year] 80 y.o.  Admit date: 10/14/2013   HPI: 80 yo WF with an extensive PMH who was discharged from [facility] 12/12/12 to [facility] as a tracheostomy/vent dependent, post cva, severe dementia and returns to [facility] 10/14/13 with severe hypoxic resp failure refractory to current ventilation stratgies available at [facility] and is transferred back to [facility].  SIGNIFICANT EVENTS / STUDIES:  7/07 tx to [facility] from [facility] 7/09 Weaning off vasopressors  LINES / TUBES:  Trach (chronic) >>  L [place] CVL 7/07 >>   CULTURES:  Urine 7/07 >> 100k GNRs >>  Resp 7/07 >> NOF Blood 7/07>>    ANTIBIOTICS:  vanc 7/07 >> 7/09 pip tazo 7/07 >>  cipro 7/07 >>    Filed Vitals:   10/16/13 1928 10/16/13 1954 10/16/13 2000 10/16/13 2030  BP: 125/85  122/87 91/52  Pulse:   126 88  Temp:  98.2 F (36.8 C)    TempSrc:  Oral    Resp:   24 12  Height:      Weight:      SpO2: 100%  96% 98%   PHYSICAL EXAMINATION:  General: grimacing, not F/C  Neuro: diffusely weak, not F/C, RASS -1 HEENT: WNL, trach site clean Cardiovascular: RRR, no M Lungs: Coarse rhonchi bilat  Abdomen: Obese, G tube site clean   Ext: Cool, symmetric edema, no cyanosis  I have reviewed all of today's lab results. Relevant abnormalities are discussed in the A/P section  CXR: NNF  ASSESSMENT / PLAN:  PULMONARY  A:  Chronic VDRF  Pulmonary infiltrates, possible HCAP and/or edema P:  Cont full vent support - settings reviewed and/or adjusted Cont vent bundle Daily SBT if/when meets criteria  CARDIOVASCULAR  A:  Septic shock  CAD  Cardiomyopathy (EF 15% 09/2012) P:  Cont to wean vasopressors to off for MAP > 65 mmHg  RENAL  A:  No acute issue  P:  Monitor BMET intermittently Monitor I/Os Correct electrolytes as indicated  GASTROINTESTINAL  A:  Dysphagia  Chronic G tube  P:  SUP: PPI Cont TFs  HEMATOLOGIC  A:   Chronic anemia  P:  DVT px: SQ heparin Monitor CBC intermittently Transfuse per usual ICU guidelines  INFECTIOUS  A:  Severe sepsis GNR UTI P:  Micro and abx as above   ENDOCRINE  A:  DM II P:  Cont SSI  NEUROLOGIC  A:  Dementia  CVA  AMS  Pain  P:  Sedation protocol RASS goal: -1 to -1  Code status: Limited code blue, no CPR or shocks full support otherwise   Discussed at length with HCPOA, Dr Sherald Hess. She understands that we are likely in the realm of medical futility here. However, she is trying to comply with what she believes were the directions given to her by the pt. We agree to no escalation of interventions from our current levels. We will pursue transfer to [facility] LTACH 7/10  40 mins CCM time  Billy Fischer, MD ; [facility] [phone].  After 5:30 PM or weekends, call [phone]

## 2013-10-16 NOTE — Trach Care Team (Signed)
Trach Care Progression Note   Patient Details Name: Jenny Brady MRN: [id number] DOB: 1925/08/22 Today's Date: 10/16/2013   Tracheostomy Assessment    Tracheostomy Shiley 6 mm Cuffed;Other (Comment) (Active)  Status Secured 10/16/2013 11:52 AM  Site Assessment Clean;Dry 10/16/2013 11:52 AM  Site Care Cleansed 10/15/2013 10:00 PM  Inner Cannula Care Changed/new 10/15/2013 10:00 PM  Ties Assessment Clean;Dry;Secure 10/16/2013 11:52 AM  Cuff pressure (cm) 24 cm 10/16/2013  8:35 AM  Emergency Equipment at bedside Yes 10/16/2013 11:52 AM     Care Needs     Respiratory Therapy O2 Device: Ventilator FiO2 (%): 40 % SpO2: 100 %    Speech Language Pathology  SLP chart review complete: Patient does not need SLP services at this time (vent dependent with dementia)   Physical Therapy      Occupational Therapy      Nutritional Patient's Current Diet: Tube feeding Tube Feeding: Vital High Protein Tube Feeding Frequency: Continuous Tube Feeding Strength: Full strength    Case Management/Social Work Level of patient care prior to hospitalization: SNF/Assisted  living facility ([facility]) Insurance payer: Medicare Anticipated discharge disposition: SNF/Assisted  living Engineer, agricultural Care Team Recommendations  [facility] Team Members -  [place], SLP, Nicolasa Ducking, CM, [place], SW, [place], RT and [place], [place]   Dirk Dress, NP  Awaiting disposition.         Ester Hilley, Silva Bandy (scribe of team) 10/16/2013, 2:37 PM

## 2013-10-16 NOTE — Care Management Note (Signed)
Page 1 of 2   10/21/2013     4:03:59 PM CARE MANAGEMENT NOTE 10/21/2013  Patient:  LEXIANN, ZOPFI   Account Number:  [id number]  Date Initiated:  10/14/2013  Documentation initiated by:  [facility]  Subjective/Objective Assessment:   Continued resp failure - tx from SNF - [facility]     Action/Plan:   Anticipated DC Date:  10/21/2013   Anticipated DC Plan:  SKILLED NURSING FACILITY  In-house referral  Clinical Social Worker      DC Planning Services  CM consult      Choice offered to / List presented to:             Status of service:  Completed, signed off Medicare Important Message given?  YES (If response is "NO", the following Medicare IM given date fields will be blank) Date Medicare IM given:  10/17/2013 Medicare IM given by:  [facility] Date Additional Medicare IM given:   Additional Medicare IM given by:    Discharge Disposition:  LONG TERM ACUTE CARE (LTAC)  Per UR Regulation:  Reviewed for med. necessity/level of care/duration of stay  If discussed at Long Length of Stay Meetings, dates discussed:    Comments:  ContactJonice, Lewkowicz Niece [phone]                 Contongiannis,Liz Niece [phone]                 Ritta Slot [phone]  10-17-13 3:50pm Avie Arenas, RNBSN - [phone] niece and her mother - patient sister in.  Spoke with Dr. Sung Amabile, both [facility].  Plan is for discharge to [facility] tonight.  Nurse updated.  10-16-13 10am Avie Arenas, RNBSN [phone] [facility] not offering a bed due to post [facility] no SNF insurance and except for pressors - which are weaning - back to baseline and IV antibiotics which can be given at SNF level. Checked with physician - has not called and would like to talk to her face to face - is she coming over today to see Aunt. Called office for [name] - left message.  returned call - explained [facility] had not offered a bed due reason above.  Unable  to answer why they would not take her but [facility] would - suggested she call [facility]. States she would have her sister look into that. States she would not be coming over till probably late today - may be around 6ish and excpects Physician would be gone. Informed physician she would not be coming in till 6.  Due to her office hours and patients and the need to have an uninterupted discussion with her - our physician requested we give her his personal cell to call at her convenience when she had 30 minutes of uninterupted time.  I personally talked to [name] and gave her this message with physician number.  3:00pm - dezerea qui has still not called physician - Physician given her number again to call. 4pm - [name] called Physician. 4:30pm - Cancelling discharge for today - Clarabella Noser still would like to make sure [facility] will not take her. Will be in to talk with physician tomorrow.  Is not opposed to [facility], just wants to make sure the door is completely closed to [facility]  before saying "yes" to [facility].   10-15-13 11am Avie Arenas, RNBSN [phone] Recieved notification that patient could go to [facility] - [facility] bed.  Verified  has days and they have a bed.  Called niece, Chales AbrahamsMary Ann, left message for her to call me. Recalled at 1:15pm for Chales AbrahamsMary Ann.  Talked with Chales AbrahamsMary Ann and informed her we had a bed for her in the Lt[facility] at [facility] and physician deems she is ready to go today.  Neice felt they would like another facility, - referral made to [facility].  Would like to talk to SW about insurance for SNF and would like to talk to the physician.  Notified physician and SW to call niece with phone number. Physician states will discharage tomorrow once all these things are completed.

## 2013-10-17 ENCOUNTER — Inpatient Hospital Stay (HOSPITAL_COMMUNITY): Payer: Medicare Other

## 2013-10-17 DIAGNOSIS — J90 Pleural effusion, not elsewhere classified: Secondary | ICD-10-CM

## 2013-10-17 LAB — COMPREHENSIVE METABOLIC PANEL
ALBUMIN: 2.2 g/dL — AB (ref 3.5–5.2)
ALK PHOS: 77 U/L (ref 39–117)
ALT: 18 U/L (ref 0–35)
ANION GAP: 12 (ref 5–15)
AST: 18 U/L (ref 0–37)
BUN: 33 mg/dL — AB (ref 6–23)
CALCIUM: 7.6 mg/dL — AB (ref 8.4–10.5)
CO2: 26 mEq/L (ref 19–32)
Chloride: 103 mEq/L (ref 96–112)
Creatinine, Ser: 0.49 mg/dL — ABNORMAL LOW (ref 0.50–1.10)
GFR calc Af Amer: >90 mL/min (ref >90)
GFR calc non Af Amer: 86 mL/min — ABNORMAL LOW (ref >90)
Glucose, Bld: 164 mg/dL — ABNORMAL HIGH (ref 70–99)
Potassium: 3.7 mEq/L (ref 3.7–5.3)
SODIUM: 141 meq/L (ref 137–147)
TOTAL PROTEIN: 6.5 g/dL (ref 6.0–8.3)
Total Bilirubin: 0.9 mg/dL (ref 0.3–1.2)

## 2013-10-17 LAB — URINE CULTURE

## 2013-10-17 LAB — CBC
HCT: 29.2 % — ABNORMAL LOW (ref 36.0–46.0)
Hemoglobin: 8.5 g/dL — ABNORMAL LOW (ref 12.0–15.0)
MCH: 23.4 pg — ABNORMAL LOW (ref 26.0–34.0)
MCHC: 29.1 g/dL — AB (ref 30.0–36.0)
MCV: 80.4 fL (ref 78.0–100.0)
Platelets: 187 10*3/uL (ref 150–400)
RBC: 3.63 MIL/uL — ABNORMAL LOW (ref 3.87–5.11)
RDW: 18.9 % — AB (ref 11.5–15.5)
WBC: 6.1 10*3/uL (ref 4.0–10.5)

## 2013-10-17 LAB — GLUCOSE, CAPILLARY
GLUCOSE-CAPILLARY: 126 mg/dL — AB (ref 70–99)
GLUCOSE-CAPILLARY: 143 mg/dL — AB (ref 70–99)
Glucose-Capillary: 147 mg/dL — ABNORMAL HIGH (ref 70–99)
Glucose-Capillary: 127 mg/dL — ABNORMAL HIGH (ref 70–99)
Glucose-Capillary: 111 mg/dL — ABNORMAL HIGH (ref 70–99)

## 2013-10-17 LAB — LACTATE DEHYDROGENASE, PLEURAL OR PERITONEAL FLUID: LD, Fluid: 154 U/L — ABNORMAL HIGH (ref 3–23)

## 2013-10-17 LAB — PROTEIN, BODY FLUID: Total protein, fluid: 3.2 g/dL

## 2013-10-17 LAB — BODY FLUID CELL COUNT WITH DIFFERENTIAL
Eos, Fluid: 0 %
LYMPHS FL: 18 %
Monocyte-Macrophage-Serous Fluid: 17 % — ABNORMAL LOW (ref 50–90)
Neutrophil Count, Fluid: 65 % — ABNORMAL HIGH (ref 0–25)
Total Nucleated Cell Count, Fluid: 390 cu mm (ref 0–1000)

## 2013-10-17 MED ORDER — MIDAZOLAM HCL 2 MG/2ML IJ SOLN
INTRAMUSCULAR | Status: AC
  Administered 2013-10-17: 2 mg
  Filled 2013-10-17: qty 2

## 2013-10-17 MED ORDER — LEVOFLOXACIN 25 MG/ML PO SOLN: 750.0000 mg | Freq: Every day | ORAL | Status: DC

## 2013-10-17 MED ORDER — LEVOFLOXACIN 750 MG PO TABS
750.0000 mg | ORAL_TABLET | Freq: Every day | ORAL | Status: DC
  Administered 2013-10-17: 750 mg
  Filled 2013-10-17: qty 1

## 2013-10-17 NOTE — Procedures (Signed)
Left Thoracentesis Procedure Note  Pre-operative Diagnosis: Pleural effusion  Post-operative Diagnosis: same  Indications: R/O empyema  Procedure Details  Consent: Informed consent was obtained. Risks of the procedure were discussed including: infection, bleeding, pain, pneumothorax.  Under sterile conditions the patient was positioned. Betadine solution and sterile drapes were utilized.  1% buffered lidocaine was used to anesthetize the 10th lateral rib space. Fluid was obtained without any difficulties and minimal blood loss.  A dressing was applied to the wound and wound care instructions were provided.   Findings 600 ml of amber, slightly cloudy pleural fluid was obtained. A sample was sent for cytology, micro, and cell counts.  Complications:  None; patient tolerated the procedure well.          Condition: stable  Plan A follow up chest x-ray was ordered.   Attending Attestation: I performed the procedure.  Billy Fischer, MD ; [facility] [phone].  After 5:30 PM or weekends, call [phone]

## 2013-10-17 NOTE — Discharge Summary (Signed)
Patient ID:  Jenny Brady  MRN: [id number]  DOB/AGE: Feb 02, 1926 80 y.o.   Admit date: 10/14/2013  Discharge date: 10/15/2013   Problem List  Active Problems:  Severe sepsis/septic shock, resolved Klebsiella UTI (pansensitive) Possible VAP Chronic VDRF/trach tube dependent CAD  Severe cardiomyopathy (LVEF 15% by Echo 09/19/12) Pulmonary edema CKD (chronic kidney disease) stage 3, GFR 30-59 ml/min  Chronic dysphagia, G tube dependent Anemia of chronic disease  DM II Dementia H/O CVA ICU/ventilator discomfort  HPI:  80 yo WF with an extensive PMH who was discharged from [facility] 12/12/12 to [facility] as a tracheostomy/vent dependent, post cva, severe dementia and returns to [facility] 10/14/13 with severe hypoxic resp failure refractory to current ventilation stratgies available at [facility] and is transferred back to [facility].   Hospital Course:  7/07 Admitted in transfer from [facility] with dx of severe sepsis/septic shock. Treated with volume resuscitation, vasopressors, broad spectrum abx. Made limited code on admission - no ACLS/CPR, no HD  7/08 Weaning off vasopressors. Continued on Vanc, pip-tazo, cipro 7/09 Urine cx positive for GNRs. Vancomycin discontinued. On minimal vasopressors. Extended conversation with HCPOA, Dr Nathanial Rancher. Goals of care addressed.  Dr Nathanial Rancher understands that we are likely in the realm of medical futility here. However, she is trying to comply with what she believes were the directions given to her by the pt. We agree to no escalation of interventions from our current levels.  7/10 Off vasopressors. Appears unvomfortable - grimacing. Moderate to large L pleural effusion - new. 600 cc thoracentesis > slightly cloudy, amber fluid sent for chemistries, cultures and cytology. Met with pt's sister Dorian Pod and niece, Dr Nathanial Rancher. Above goals and limitations confirmed. CXR post thoracentesis: No pneumothorax status post thoracentesis. Atelectasis  rightlung base. Atelectasis versus infiltrate left lung apex and left lung base.    LINES / TUBES:  Trach (chronic)   L [place] CVL 7/07 >>   CULTURES:  Urine  7/07 >> Klebsiella (pansens) Blood  7/07 >> ngtd Pleural 7/10 >>   ANTIBIOTICS:  7/7 vanc>> 7/09 7/7 pip tazo>> 7/10 7/7 cipro >> 7/10 7/10 Levofloxacin >>    Disposition: [facility]     Medication List     STOP taking these medications       amoxicillin 875 MG tablet    Commonly known as: AMOXIL    losartan 25 MG tablet    Commonly known as: COZAAR    memantine 10 MG tablet    Commonly known as: NAMENDA     TAKE these medications       acetaminophen 160 MG/5ML solution    Commonly known as: TYLENOL    Place 20.3 mLs (650 mg total) into feeding tube every 4 (four) hours as needed for fever.    chlorhexidine 0.12 % solution    Commonly known as: PERIDEX    Use as directed 15 mLs in the mouth or throat 2 (two) times daily.    cholecalciferol 1000 UNITS tablet    Commonly known as: VITAMIN D    Take 2 tablets (2,000 Units total) by mouth daily.    cyanocobalamin 500 MCG tablet    Place 1 tablet (500 mcg total) into feeding tube daily.    feeding supplement (PRO-STAT SUGAR FREE 64) Liqd    Place 30 mLs into feeding tube 2 (two) times daily.    feeding supplement (VITAL HIGH PROTEIN) Liqd liquid    Place 1,000 mLs into feeding tube daily.    fentaNYL 0.05 MG/ML  injection    Commonly known as: SUBLIMAZE    Inject 1 mL (50 mcg total) into the vein every 2 (two) hours as needed for severe pain.    heparin 5000 UNIT/ML injection    Inject 1 mL (5,000 Units total) into the skin every 8 (eight) hours.    insulin aspart 100 UNIT/ML injection    Commonly known as: novoLOG    Inject 0-9 Units into the skin every 4 (four) hours.    ipratropium-albuterol 0.5-2.5 (3) MG/3ML Soln    Commonly known as: DUONEB    Take 3 mLs by nebulization every 6 (six) hours.    levothyroxine 100 MCG tablet    Commonly known as:  SYNTHROID, LEVOTHROID    Take 100 mcg by mouth daily before breakfast.    midazolam 2 MG/2ML Soln injection    Commonly known as: VERSED    Inject 1 mL (1 mg total) into the vein every 15 (fifteen) minutes as needed for agitation (to achieve RASS goal).    multivitamin with minerals Tabs tablet    Give 1 tablet by tube daily.    norepinephrine 4 mg in dextrose 5 % 250 mL    Inject 2-20 mcg/min into the vein continuous.    nystatin 100000 UNIT/GM Powd    Apply 1 g topically 2 (two) times daily. Apply to groin and abdomenal folds    pantoprazole sodium 40 mg/20 mL Pack    Commonly known as: PROTONIX    Place 20 mLs (40 mg total) into feeding tube daily.    polyvinyl alcohol 1.4 % ophthalmic solution    Commonly known as: LIQUIFILM TEARS    Place 1 drop into both eyes 4 (four) times daily.    sodium chloride 0.9 % infusion    Inject 100 mLs into the vein continuous.      Levofloxacin 750 mg per tube daily - complete 10 days antibiotics    Discharged Condition: poor  Time spent on discharge greater than 40 minutes.   PCCM ATTENDING:  Merton Border, MD ; [facility] service Mobile [phone].  After 5:30 PM or weekends, call [phone]

## 2013-10-17 NOTE — Progress Notes (Signed)
ANTIBIOTIC CONSULT NOTE - FOLLOW UP  Pharmacy Consult:  Zosyn / Cipro Indication:  Sepsis + HCAP  Allergies  Allergen Reactions  . Hyoscyamine Other (See Comments)    Per MAR  . Ivp Dye [Iodinated Diagnostic Agents] Other (See Comments)    Per MAR  . Septra [Sulfamethoxazole-Trimethoprim] Other (See Comments)    Per [facility]    Patient Measurements: Height: 5' 1.02" (155 cm) Weight: 193 lb 5.5 oz (87.7 kg) IBW/kg (Calculated) : 47.86  Vital Signs: Temp: 98.4 F (36.9 C) (07/10 0330) Temp src: Oral (07/10 0330) BP: 116/57 mmHg (07/10 0700) Pulse Rate: 82 (07/10 0700)  Labs:  Recent Labs  10/14/13 1014 10/15/13 0430 10/17/13 0350  WBC 14.5* 15.2* 6.1  HGB 9.9* 9.8* 8.5*  PLT 178 194 187  CREATININE 0.56 0.56 0.49*   Estimated Creatinine Clearance: 51.8 ml/min (by C-G formula based on Cr of 0.49). No results found for this basename: VANCOTROUGH, Leodis Binet, VANCORANDOM, GENTTROUGH, GENTPEAK, GENTRANDOM, TOBRATROUGH, TOBRAPEAK, TOBRARND, AMIKACINPEAK, AMIKACINTROU, AMIKACIN,  in the last 72 hours   Microbiology: Recent Results (from the past 720 hour(s))  MRSA PCR SCREENING     Status: Abnormal   Collection Time    10/14/13 10:19 AM      Result Value Ref Range Status   MRSA by PCR POSITIVE (*) NEGATIVE Final   Comment:            The GeneXpert MRSA Assay (FDA     approved for NASAL specimens     only), is one component of a     comprehensive MRSA colonization     surveillance program. It is not     intended to diagnose MRSA     infection nor to guide or     monitor treatment for     MRSA infections.     RESULT CALLED TO, READ BACK BY AND VERIFIED WITH:     Ricci Barker RN 12:25 10/14/13 ([email or web address])  URINE CULTURE     Status: None   Collection Time    10/14/13 10:19 AM      Result Value Ref Range Status   Specimen Description URINE, CATHETERIZED   Final   Special Requests NONE   Final   Culture  Setup Time     Final   Value: 10/14/2013 11:25     Performed at [facility]   Colony Count     Final   Value: >=100,000 COLONIES/ML     Performed at [facility]   Culture     Final   Value: KLEBSIELLA PNEUMONIAE     Performed at [facility]   Report Status 10/17/2013 FINAL   Final   Organism ID, Bacteria KLEBSIELLA PNEUMONIAE   Final  CLOSTRIDIUM DIFFICILE BY PCR     Status: None   Collection Time    10/14/13 10:19 AM      Result Value Ref Range Status   C difficile by pcr NEGATIVE  NEGATIVE Final  CULTURE, RESPIRATORY (NON-EXPECTORATED)     Status: None   Collection Time    10/14/13 10:50 AM      Result Value Ref Range Status   Specimen Description TRACHEAL ASPIRATE   Final   Special Requests NONE   Final   Gram Stain     Final   Value: MODERATE WBC PRESENT,BOTH PMN AND MONONUCLEAR     NO SQUAMOUS EPITHELIAL CELLS SEEN     NO ORGANISMS SEEN     Performed at [facility]  Culture     Final   Value: Non-Pathogenic Oropharyngeal-type Flora Isolated.     [facility] at [facility]   Report Status 10/16/2013 FINAL   Final  CULTURE, BLOOD (ROUTINE X 2)     Status: None   Collection Time    10/14/13 11:45 AM      Result Value Ref Range Status   Specimen Description BLOOD RIGHT HAND   Final   Special Requests BOTTLES DRAWN AEROBIC ONLY 5CC   Final   Culture  Setup Time     Final   Value: 10/14/2013 16:48     Performed at [facility]   Culture     Final   Value:        BLOOD CULTURE RECEIVED NO GROWTH TO DATE CULTURE WILL BE HELD FOR 5 DAYS BEFORE ISSUING A FINAL NEGATIVE REPORT     Performed at [facility]   Report Status PENDING   Incomplete  CULTURE, BLOOD (ROUTINE X 2)     Status: None   Collection Time    10/14/13 12:00 PM      Result Value Ref Range Status   Specimen Description BLOOD LEFT HAND   Final   Special Requests BOTTLES DRAWN AEROBIC ONLY 4.5CC   Final   Culture  Setup Time     Final   Value: 10/14/2013 16:48     Performed at [facility]   Culture     Final    Value:        BLOOD CULTURE RECEIVED NO GROWTH TO DATE CULTURE WILL BE HELD FOR 5 DAYS BEFORE ISSUING A FINAL NEGATIVE REPORT     Performed at [facility]   Report Status PENDING   Incomplete      Assessment: [age] YOF transferred from [facility] with hypoxia and was diagnosed with sepsis and HCAP.  Antibiotics narrowed yesterday to Zosyn and Cipro.  Patient's renal function is stable.  Vanc 7/7 >> 7/9 Zosyn 7/7 >> Cipro 7/7 >>  7/7 BCx x2 - NGTD 7/7 C.diff - negative 7/7 UCx - Kleb pneumo (pan sensitive) 7/7 TA - negative 7/7 MRSA PCR - positive   Goal of Therapy:  Clearance of infection   Plan:  - Zosyn 3.375gm IV Q8H (4 hr infusion) - Cipro 400mg  IV Q12H - Monitor renal fxn, clinical progress - F/U further abx de-escalation    Anastacio Bua D. Laney Potashang, PharmD, BCPS Pager:  [phone] 10/17/2013, 8:11 AM

## 2013-10-17 NOTE — Progress Notes (Signed)
NUTRITION FOLLOW UP  Intervention:    Continue 68M PEPuP Protocol with TF via PEG: Vital High Protein at goal rate of 50 ml/h (1200 ml per day) to provide 1200 kcals (25 kcals/kg ideal weight), 105 gm protein, 1003 ml free water daily.  Nutrition Dx:   Inadequate oral intake related to inability to eat as evidenced by NPO status. Ongoing.  Goal:   Enteral nutrition to provide 60-70% of estimated calorie needs (22-25 kcals/kg ideal body weight) and 100% of estimated protein needs, based on ASPEN guidelines for permissive underfeeding in critically ill obese individuals. Met.  Monitor:   TF tolerance/adequacy, weight trend, labs, vent status.  Assessment:   80 yo WF with an extensive PMH who was discharged from [facility] 12/12/12 to [facility] as a tracheostomy/vent dependent, post cva, severe dementia and returns to [facility] 10/14/13 with severe hypoxic resp failure refractory to current ventilation stratgies available at [facility] and is transferred back to [facility].  Discussed patient in ICU rounds today. Patient is tolerating TF well at goal volume to meet nutrition goal. Receiving Vital High Protein at goal rate of 50 ml/h (1200 ml per day) to provide 1200 kcals, 105 gm protein, 1003 ml free water daily. Minimal residuals of 30-50 ml via PEG.  Patient is currently intubated on ventilator support MV: 9 L/min Temp (24hrs), Avg:98.2 F (36.8 C), Min:97.7 F (36.5 C), Max:98.6 F (37 C)   Height: Ht Readings from Last 1 Encounters:  10/14/13 5' 1.02" (1.55 m)    Weight Status:   Wt Readings from Last 1 Encounters:  10/17/13 193 lb 5.5 oz (87.7 kg)  10/14/13  171 lb 8.3 oz (77.8 kg)   Re-estimated needs:  Kcal: 1464 Protein: >/= 95 gm Fluid: 1.8-2 L  Skin: stage 1 pressure ulcer to sacrum  Diet Order:  NPO   Intake/Output Summary (Last 24 hours) at 10/17/13 1158 Last data filed at 10/17/13 0900  Gross per 24 hour  Intake 4010.98 ml  Output   1010 ml  Net 3000.98 ml     Last BM: 7/8   Labs:   Recent Labs Lab 10/14/13 1014 10/15/13 0430 10/17/13 0350  NA 137 137 141  K 4.5 3.8 3.7  CL 96 98 103  CO2 '23 24 26  ' BUN 26* 29* 33*  CREATININE 0.56 0.56 0.49*  CALCIUM 8.5 8.5 7.6*  MG 2.1  --   --   PHOS 4.9*  --   --   GLUCOSE 113* 219* 164*    CBG (last 3)   Recent Labs  10/17/13 0027 10/17/13 0354 10/17/13 0800  GLUCAP 126* 147* 127*    Scheduled Meds: . antiseptic oral rinse  15 mL Mouth Rinse QID  . chlorhexidine  15 mL Mouth Rinse BID  . Chlorhexidine Gluconate Cloth  6 each Topical Q0600  . feeding supplement (VITAL HIGH PROTEIN)  1,000 mL Per Tube Q24H  . heparin  5,000 Units Subcutaneous 3 times per day  . insulin aspart  0-9 Units Subcutaneous 6 times per day  . ipratropium-albuterol  3 mL Nebulization Q6H  . levofloxacin  750 mg Per Tube Daily  . midazolam      . mupirocin ointment  1 application Nasal BID  . pantoprazole sodium  40 mg Per Tube Daily    Continuous Infusions: . sodium chloride 100 mL/hr at 10/17/13 0600  . norepinephrine (LEVOPHED) Adult infusion Stopped (10/17/13 0401)    Molli Barrows, RD, LDN, [place] Pager [phone] After Hours Pager [phone]

## 2013-10-17 NOTE — Progress Notes (Signed)
UR Completed.  Chrisanna Mishra Jane [phone] 10/17/2013  

## 2013-10-20 LAB — CULTURE, BLOOD (ROUTINE X 2)
CULTURE: NO GROWTH
Culture: NO GROWTH

## 2013-10-20 LAB — BODY FLUID CULTURE: CULTURE: NO GROWTH

## 2013-10-20 LAB — GLUCOSE, CAPILLARY: Glucose-Capillary: 128 mg/dL — ABNORMAL HIGH (ref 70–99)

## 2013-11-10 ENCOUNTER — Ambulatory Visit (HOSPITAL_COMMUNITY)
Admission: RE | Admit: 2013-11-10 | Discharge: 2013-11-10 | Disposition: A | Discharge status: Home / Self Care | Payer: Medicare Other | Source: Ambulatory Visit | Attending: Internal Medicine | Admitting: Internal Medicine

## 2013-11-10 ENCOUNTER — Other Ambulatory Visit (HOSPITAL_COMMUNITY): Payer: Self-pay | Admitting: Internal Medicine

## 2013-11-10 DIAGNOSIS — R4182 Altered mental status, unspecified: Secondary | ICD-10-CM | POA: Insufficient documentation

## 2014-01-18 ENCOUNTER — Inpatient Hospital Stay (HOSPITAL_COMMUNITY)
Admission: EM | Admit: 2014-01-18 | Discharge: 2014-02-08 | DRG: 308 | Disposition: E | Payer: Medicare Other | Attending: Pulmonary Disease | Admitting: Pulmonary Disease

## 2014-01-18 ENCOUNTER — Emergency Department (HOSPITAL_COMMUNITY): Payer: Medicare Other

## 2014-01-18 ENCOUNTER — Encounter (HOSPITAL_COMMUNITY): Payer: Self-pay | Admitting: Emergency Medicine

## 2014-01-18 DIAGNOSIS — Z9911 Dependence on respirator [ventilator] status: Secondary | ICD-10-CM

## 2014-01-18 DIAGNOSIS — R579 Shock, unspecified: Secondary | ICD-10-CM

## 2014-01-18 DIAGNOSIS — F0390 Unspecified dementia without behavioral disturbance: Secondary | ICD-10-CM | POA: Diagnosis present

## 2014-01-18 DIAGNOSIS — I255 Ischemic cardiomyopathy: Secondary | ICD-10-CM | POA: Diagnosis present

## 2014-01-18 DIAGNOSIS — Z66 Do not resuscitate: Secondary | ICD-10-CM | POA: Diagnosis not present

## 2014-01-18 DIAGNOSIS — N179 Acute kidney failure, unspecified: Secondary | ICD-10-CM | POA: Diagnosis present

## 2014-01-18 DIAGNOSIS — E872 Acidosis: Secondary | ICD-10-CM | POA: Diagnosis present

## 2014-01-18 DIAGNOSIS — I69391 Dysphagia following cerebral infarction: Secondary | ICD-10-CM

## 2014-01-18 DIAGNOSIS — J9620 Acute and chronic respiratory failure, unspecified whether with hypoxia or hypercapnia: Secondary | ICD-10-CM

## 2014-01-18 DIAGNOSIS — K219 Gastro-esophageal reflux disease without esophagitis: Secondary | ICD-10-CM | POA: Diagnosis present

## 2014-01-18 DIAGNOSIS — Z79899 Other long term (current) drug therapy: Secondary | ICD-10-CM | POA: Diagnosis not present

## 2014-01-18 DIAGNOSIS — G9382 Brain death: Secondary | ICD-10-CM | POA: Diagnosis present

## 2014-01-18 DIAGNOSIS — J9610 Chronic respiratory failure, unspecified whether with hypoxia or hypercapnia: Secondary | ICD-10-CM | POA: Diagnosis present

## 2014-01-18 DIAGNOSIS — E0590 Thyrotoxicosis, unspecified without thyrotoxic crisis or storm: Secondary | ICD-10-CM | POA: Diagnosis present

## 2014-01-18 DIAGNOSIS — Z931 Gastrostomy status: Secondary | ICD-10-CM

## 2014-01-18 DIAGNOSIS — I469 Cardiac arrest, cause unspecified: Secondary | ICD-10-CM | POA: Diagnosis present

## 2014-01-18 DIAGNOSIS — I2510 Atherosclerotic heart disease of native coronary artery without angina pectoris: Secondary | ICD-10-CM | POA: Diagnosis present

## 2014-01-18 DIAGNOSIS — Z955 Presence of coronary angioplasty implant and graft: Secondary | ICD-10-CM | POA: Diagnosis not present

## 2014-01-18 DIAGNOSIS — G931 Anoxic brain damage, not elsewhere classified: Secondary | ICD-10-CM | POA: Diagnosis present

## 2014-01-18 DIAGNOSIS — Z794 Long term (current) use of insulin: Secondary | ICD-10-CM

## 2014-01-18 DIAGNOSIS — R570 Cardiogenic shock: Secondary | ICD-10-CM | POA: Diagnosis present

## 2014-01-18 DIAGNOSIS — D638 Anemia in other chronic diseases classified elsewhere: Secondary | ICD-10-CM | POA: Diagnosis present

## 2014-01-18 DIAGNOSIS — I4901 Ventricular fibrillation: Secondary | ICD-10-CM | POA: Diagnosis present

## 2014-01-18 DIAGNOSIS — Z930 Tracheostomy status: Secondary | ICD-10-CM

## 2014-01-18 DIAGNOSIS — I69351 Hemiplegia and hemiparesis following cerebral infarction affecting right dominant side: Secondary | ICD-10-CM

## 2014-01-18 DIAGNOSIS — I5022 Chronic systolic (congestive) heart failure: Secondary | ICD-10-CM | POA: Diagnosis present

## 2014-01-18 DIAGNOSIS — E119 Type 2 diabetes mellitus without complications: Secondary | ICD-10-CM | POA: Diagnosis present

## 2014-01-18 DIAGNOSIS — Z7901 Long term (current) use of anticoagulants: Secondary | ICD-10-CM | POA: Diagnosis not present

## 2014-01-18 DIAGNOSIS — I69320 Aphasia following cerebral infarction: Secondary | ICD-10-CM

## 2014-01-18 DIAGNOSIS — T68XXXA Hypothermia, initial encounter: Secondary | ICD-10-CM | POA: Diagnosis present

## 2014-01-18 DIAGNOSIS — G4733 Obstructive sleep apnea (adult) (pediatric): Secondary | ICD-10-CM | POA: Diagnosis present

## 2014-01-18 DIAGNOSIS — K589 Irritable bowel syndrome without diarrhea: Secondary | ICD-10-CM | POA: Diagnosis present

## 2014-01-18 LAB — TROPONIN I: Troponin I: <0.3 ng/mL (ref <0.30)

## 2014-01-18 LAB — CREATININE, SERUM
Creatinine, Ser: 0.86 mg/dL (ref 0.50–1.10)
GFR calc non Af Amer: 59 mL/min — ABNORMAL LOW (ref >90)
GFR, EST AFRICAN AMERICAN: 69 mL/min — AB (ref >90)

## 2014-01-18 LAB — COMPREHENSIVE METABOLIC PANEL
ALBUMIN: 2.3 g/dL — AB (ref 3.5–5.2)
ALT: 205 U/L — ABNORMAL HIGH (ref 0–35)
ANION GAP: 25 — AB (ref 5–15)
AST: 294 U/L — ABNORMAL HIGH (ref 0–37)
Alkaline Phosphatase: 115 U/L (ref 39–117)
BUN: 24 mg/dL — AB (ref 6–23)
CALCIUM: 8.6 mg/dL (ref 8.4–10.5)
CO2: 18 mEq/L — ABNORMAL LOW (ref 19–32)
CREATININE: 0.86 mg/dL (ref 0.50–1.10)
Chloride: 96 mEq/L (ref 96–112)
GFR calc Af Amer: 69 mL/min — ABNORMAL LOW (ref >90)
GFR calc non Af Amer: 59 mL/min — ABNORMAL LOW (ref >90)
Glucose, Bld: 236 mg/dL — ABNORMAL HIGH (ref 70–99)
Potassium: 4.3 mEq/L (ref 3.7–5.3)
Sodium: 139 mEq/L (ref 137–147)
TOTAL PROTEIN: 7.2 g/dL (ref 6.0–8.3)
Total Bilirubin: 0.7 mg/dL (ref 0.3–1.2)

## 2014-01-18 LAB — I-STAT ARTERIAL BLOOD GAS, ED
Acid-base deficit: 5 mmol/L — ABNORMAL HIGH (ref 0.0–2.0)
Acid-base deficit: 3 mmol/L — ABNORMAL HIGH (ref 0.0–2.0)
Bicarbonate: 22.2 mEq/L (ref 20.0–24.0)
Bicarbonate: 21.2 mEq/L (ref 20.0–24.0)
O2 Saturation: 100 %
O2 Saturation: 97 %
PO2 ART: 81 mmHg (ref 80.0–100.0)
Patient temperature: 93.2
TCO2: 24 mmol/L (ref 0–100)
TCO2: 22 mmol/L (ref 0–100)
pCO2 arterial: 46.3 mmHg — ABNORMAL HIGH (ref 35.0–45.0)
pCO2 arterial: 31.4 mmHg — ABNORMAL LOW (ref 35.0–45.0)
pH, Arterial: 7.288 — ABNORMAL LOW (ref 7.350–7.450)
pH, Arterial: 7.424 (ref 7.350–7.450)
pO2, Arterial: 366 mmHg — ABNORMAL HIGH (ref 80.0–100.0)

## 2014-01-18 LAB — URINALYSIS, ROUTINE W REFLEX MICROSCOPIC
Bilirubin Urine: NEGATIVE
Glucose, UA: NEGATIVE mg/dL
KETONES UR: NEGATIVE mg/dL
Nitrite: NEGATIVE
Specific Gravity, Urine: 1.009 (ref 1.005–1.030)
Urobilinogen, UA: 1 mg/dL (ref 0.0–1.0)
pH: 7 (ref 5.0–8.0)

## 2014-01-18 LAB — CBC WITH DIFFERENTIAL/PLATELET
BASOS ABS: 0 10*3/uL (ref 0.0–0.1)
BASOS PCT: 0 % (ref 0–1)
EOS PCT: 0 % (ref 0–5)
Eosinophils Absolute: 0 10*3/uL (ref 0.0–0.7)
HCT: 34.1 % — ABNORMAL LOW (ref 36.0–46.0)
Hemoglobin: 10.4 g/dL — ABNORMAL LOW (ref 12.0–15.0)
LYMPHS ABS: 1.3 10*3/uL (ref 0.7–4.0)
LYMPHS PCT: 14 % (ref 12–46)
MCH: 27.7 pg (ref 26.0–34.0)
MCHC: 30.5 g/dL (ref 30.0–36.0)
MCV: 90.9 fL (ref 78.0–100.0)
Monocytes Absolute: 0.1 10*3/uL (ref 0.1–1.0)
Monocytes Relative: 1 % — ABNORMAL LOW (ref 3–12)
NEUTROS ABS: 8 10*3/uL — AB (ref 1.7–7.7)
NEUTROS PCT: 85 % — AB (ref 43–77)
PLATELETS: 197 10*3/uL (ref 150–400)
RBC: 3.75 MIL/uL — AB (ref 3.87–5.11)
RDW: 16.7 % — ABNORMAL HIGH (ref 11.5–15.5)
WBC: 9.5 10*3/uL (ref 4.0–10.5)

## 2014-01-18 LAB — CBC
HCT: 34.7 % — ABNORMAL LOW (ref 36.0–46.0)
Hemoglobin: 11 g/dL — ABNORMAL LOW (ref 12.0–15.0)
MCH: 28.1 pg (ref 26.0–34.0)
MCHC: 31.7 g/dL (ref 30.0–36.0)
MCV: 88.7 fL (ref 78.0–100.0)
Platelets: 213 10*3/uL (ref 150–400)
RBC: 3.91 MIL/uL (ref 3.87–5.11)
RDW: 16.6 % — AB (ref 11.5–15.5)
WBC: 13.3 10*3/uL — ABNORMAL HIGH (ref 4.0–10.5)

## 2014-01-18 LAB — PROTIME-INR
INR: 1.62 — AB (ref 0.00–1.49)
PROTHROMBIN TIME: 19.2 s — AB (ref 11.6–15.2)

## 2014-01-18 LAB — URINE MICROSCOPIC-ADD ON

## 2014-01-18 LAB — LACTIC ACID, PLASMA
LACTIC ACID, VENOUS: 12.9 mmol/L — AB (ref 0.5–2.2)
Lactic Acid, Venous: 8 mmol/L — ABNORMAL HIGH (ref 0.5–2.2)

## 2014-01-18 LAB — CBG MONITORING, ED: GLUCOSE-CAPILLARY: 301 mg/dL — AB (ref 70–99)

## 2014-01-18 LAB — GLUCOSE, CAPILLARY: GLUCOSE-CAPILLARY: 153 mg/dL — AB (ref 70–99)

## 2014-01-18 LAB — PROCALCITONIN: PROCALCITONIN: 0.7 ng/mL

## 2014-01-18 MED ORDER — SODIUM CHLORIDE 0.9 % IV BOLUS (SEPSIS)
500.0000 mL | Freq: Once | INTRAVENOUS | Status: AC
  Administered 2014-01-18: 500 mL via INTRAVENOUS

## 2014-01-18 MED ORDER — INSULIN ASPART 100 UNIT/ML ~~LOC~~ SOLN
1.0000 [IU] | SUBCUTANEOUS | Status: DC
  Administered 2014-01-18: 2 [IU] via SUBCUTANEOUS

## 2014-01-18 MED ORDER — PIPERACILLIN-TAZOBACTAM 3.375 G IVPB
3.3750 g | Freq: Three times a day (TID) | INTRAVENOUS | Status: DC
  Administered 2014-01-19: 3.375 g via INTRAVENOUS
  Filled 2014-01-18 (×3): qty 50

## 2014-01-18 MED ORDER — SODIUM CHLORIDE 0.9 % IV BOLUS (SEPSIS)
1000.0000 mL | Freq: Once | INTRAVENOUS | Status: AC
  Administered 2014-01-18: 1000 mL via INTRAVENOUS

## 2014-01-18 MED ORDER — ASPIRIN 300 MG RE SUPP
300.0000 mg | RECTAL | Status: AC
  Administered 2014-01-18: 300 mg via RECTAL
  Filled 2014-01-18: qty 1

## 2014-01-18 MED ORDER — PIPERACILLIN-TAZOBACTAM 3.375 G IVPB: 3.3750 g | Freq: Three times a day (TID) | INTRAVENOUS | Status: DC

## 2014-01-18 MED ORDER — DEXTROSE 5 % IV SOLN
0.0000 ug/min | INTRAVENOUS | Status: DC
  Administered 2014-01-19: 20 ug/min via INTRAVENOUS
  Administered 2014-01-19: 70 ug/min via INTRAVENOUS
  Filled 2014-01-18 (×3): qty 16

## 2014-01-18 MED ORDER — VANCOMYCIN HCL IN DEXTROSE 750-5 MG/150ML-% IV SOLN
750.0000 mg | Freq: Two times a day (BID) | INTRAVENOUS | Status: DC
  Filled 2014-01-18 (×2): qty 150

## 2014-01-18 MED ORDER — DEXTROSE 5 % IV SOLN
0.0000 ug/min | Freq: Once | INTRAVENOUS | Status: AC
  Administered 2014-01-18: 5 ug/min via INTRAVENOUS
  Filled 2014-01-18: qty 4

## 2014-01-18 MED ORDER — HEPARIN SODIUM (PORCINE) 5000 UNIT/ML IJ SOLN
5000.0000 [IU] | Freq: Three times a day (TID) | INTRAMUSCULAR | Status: DC
  Administered 2014-01-18 – 2014-01-19 (×2): 5000 [IU] via SUBCUTANEOUS
  Filled 2014-01-18 (×4): qty 1

## 2014-01-18 MED ORDER — CHLORHEXIDINE GLUCONATE 0.12 % MT SOLN
15.0000 mL | Freq: Two times a day (BID) | OROMUCOSAL | Status: DC
  Administered 2014-01-18 – 2014-01-21 (×6): 15 mL via OROMUCOSAL
  Filled 2014-01-18 (×4): qty 15

## 2014-01-18 MED ORDER — PIPERACILLIN-TAZOBACTAM 3.375 G IVPB 30 MIN
3.3750 g | INTRAVENOUS | Status: AC
  Administered 2014-01-18: 3.375 g via INTRAVENOUS
  Filled 2014-01-18: qty 50

## 2014-01-18 MED ORDER — VANCOMYCIN HCL IN DEXTROSE 1-5 GM/200ML-% IV SOLN
1000.0000 mg | Freq: Once | INTRAVENOUS | Status: AC
  Administered 2014-01-18: 1000 mg via INTRAVENOUS
  Filled 2014-01-18: qty 200

## 2014-01-18 MED ORDER — PANTOPRAZOLE SODIUM 40 MG IV SOLR
40.0000 mg | INTRAVENOUS | Status: DC
  Administered 2014-01-18 – 2014-01-19 (×2): 40 mg via INTRAVENOUS
  Filled 2014-01-18 (×3): qty 40

## 2014-01-18 MED ORDER — SODIUM CHLORIDE 0.9 % IV SOLN: 250.0000 mL | INTRAVENOUS | Status: DC | PRN

## 2014-01-18 MED ORDER — FAMOTIDINE IN NACL 20-0.9 MG/50ML-% IV SOLN: 20.0000 mg | Freq: Two times a day (BID) | INTRAVENOUS | Status: DC

## 2014-01-18 MED ORDER — ASPIRIN 81 MG PO CHEW: 324.0000 mg | CHEWABLE_TABLET | ORAL | Status: AC

## 2014-01-18 MED ORDER — SODIUM CHLORIDE 0.9 % IV BOLUS (SEPSIS)
750.0000 mL | Freq: Once | INTRAVENOUS | Status: AC
  Administered 2014-01-18: 750 mL via INTRAVENOUS

## 2014-01-18 MED ORDER — CETYLPYRIDINIUM CHLORIDE 0.05 % MT LIQD
7.0000 mL | Freq: Four times a day (QID) | OROMUCOSAL | Status: DC
  Administered 2014-01-19 – 2014-01-21 (×10): 7 mL via OROMUCOSAL

## 2014-01-18 NOTE — ED Provider Notes (Signed)
The patient is an 80 year old female from [facility]. She has had a stroke a year and a half ago and has been on a ventilator with a tracheostomy. I initially received history prior to arrival from her niece.  Her niece is a Development worker, community in town and also holds her power of attorney. She states that she has been out of town today. She received a call from [facility] stating that her aunt had had a cardiac arrest and turned blue. They had her listed as a DO NOT RESUSCITATE. Her niece, states that she is not a DO NOT RESUSCITATE. She then told them to send her to the hospital for further treatment.  EMS reports that she was defibrillated once and received epinephrine x3. She has not been responsive to them and has been hypotensive.  Patient has io in place.  She is placed on monitor and is hypotensive.  Central line placed in right femoral vein.  Patient given fluid bolus and norepi started with increased bp.  Patient unresponsive .  Bilateral pupils dilated. Right hand blue, right foot less pink than left.  Pulses palpable right radial and right groin.  Patient with improved bp on pressor.  Results for orders placed during the hospital encounter of 02/03/2014  CBC WITH DIFFERENTIAL      Result Value Ref Range   WBC 9.5  4.0 - 10.5 K/uL   RBC 3.75 (*) 3.87 - 5.11 MIL/uL   Hemoglobin 10.4 (*) 12.0 - 15.0 g/dL   HCT 58.5 (*) 27.7 - 82.4 %   MCV 90.9  78.0 - 100.0 fL   MCH 27.7  26.0 - 34.0 pg   MCHC 30.5  30.0 - 36.0 g/dL   RDW 23.5 (*) 36.1 - 44.3 %   Platelets 197  150 - 400 K/uL   Neutrophils Relative % 85 (*) 43 - 77 %   Neutro Abs 8.0 (*) 1.7 - 7.7 K/uL   Lymphocytes Relative 14  12 - 46 %   Lymphs Abs 1.3  0.7 - 4.0 K/uL   Monocytes Relative 1 (*) 3 - 12 %   Monocytes Absolute 0.1  0.1 - 1.0 K/uL   Eosinophils Relative 0  0 - 5 %   Eosinophils Absolute 0.0  0.0 - 0.7 K/uL   Basophils Relative 0  0 - 1 %   Basophils Absolute 0.0  0.0 - 0.1 K/uL  TROPONIN I      Result Value  Ref Range   Troponin I <0.30  <0.30 ng/mL  PROTIME-INR      Result Value Ref Range   Prothrombin Time 19.2 (*) 11.6 - 15.2 seconds   INR 1.62 (*) 0.00 - 1.49  LACTIC ACID, PLASMA      Result Value Ref Range   Lactic Acid, Venous 12.9 (*) 0.5 - 2.2 mmol/L  CBG MONITORING, ED      Result Value Ref Range   Glucose-Capillary 301 (*) 70 - 99 mg/dL  I-STAT ARTERIAL BLOOD GAS, ED      Result Value Ref Range   pH, Arterial 7.288 (*) 7.350 - 7.450   pCO2 arterial 46.3 (*) 35.0 - 45.0 mmHg   pO2, Arterial 366.0 (*) 80.0 - 100.0 mmHg   Bicarbonate 22.2  20.0 - 24.0 mEq/L   TCO2 24  0 - 100 mmol/L   O2 Saturation 100.0     Acid-base deficit 5.0 (*) 0.0 - 2.0 mmol/L   Collection site RADIAL, ALLEN'S TEST ACCEPTABLE     Drawn by  RT     Sample type ARTERIAL       Seen with Dr. Marcha SoldersBrtalik.  Please see complete note.  Patient continues acidotic and elevated lactic- likely secondary to extended hypotensive episode when patient thought to be dnr, but could also be secondary to infection. Blood cultures obtained.  RN attempting foley.  Antibiotics ordered per critical care.  I saw and evaluated the patient, reviewed the resident's note and I agree with the findings and plan.   EKG Interpretation   Date/Time:  Sunday January 18 2014 17:28:02 EDT Ventricular Rate:  90 PR Interval:  150 QRS Duration: 174 QT Interval:  456 QTC Calculation: 558 R Axis:   -177 Text Interpretation:  Sinus rhythm Left bundle branch block Baseline  wander in lead(s) V6 ED PHYSICIAN INTERPRETATION AVAILABLE IN [facility] Confirmed by TEST, Record ([id number]) on 01/20/2014 7:08:14 AM      CRITICAL CARE Performed by: Hilario QuarryAY,Danne Vasek S Total critical care time: 60 Critical care time was exclusive of separately billable procedures and treating other patients. Critical care was necessary to treat or prevent imminent or life-threatening deterioration. Critical care was time spent personally by me on the following  activities: development of treatment plan with patient and/or surrogate as well as nursing, discussions with consultants, evaluation of patient's response to treatment, examination of patient, obtaining history from patient or surrogate, ordering and performing treatments and interventions, ordering and review of laboratory studies, ordering and review of radiographic studies, pulse oximetry and re-evaluation of patient's condition.   Hilario Quarryanielle S Rebacca Votaw, MD 01/20/14 [phone]

## 2014-01-18 NOTE — Progress Notes (Signed)
eLink Physician-Brief Progress Note Patient Name: Jenny Brady DOB: 03-07-1926 MRN: [id number]   Date of Service  01/08/2014  HPI/Events of Note  Called from ED for admission; Jenny Brady well known to our service from multiple prior admissions for sepsis Per EMS was found to have asystole vs Vfib, no clear documentation Niece notes that the nurse found her to be blue in her nursing home room, they planned for chemical code but then they changed code status to Full code and started CPR No clear preceding event  eICU Interventions  Admit to ICU Not a candidate for hypothermia Stat labs Cover for sepsis given multiple recent events  Niece indicates she is full code     Intervention Category Evaluation Type: New Patient Evaluation  MCQUAID, DOUGLAS 01/12/2014, 6:56 PM

## 2014-01-18 NOTE — ED Notes (Signed)
Attempted to call report. Floor RN unable to accept report.  

## 2014-01-18 NOTE — ED Notes (Addendum)
Per ems-- pt from [facility]. Pt had episode of v-fib or asystole. Pt was shocked x1 and given 3 rounds of epi and 1 bicarb. Pt achieved ROSC. Pulses present. IO to R leg. Pt hypotensive.

## 2014-01-18 NOTE — ED Provider Notes (Signed)
CSN: [id number]     Arrival date & time 01/24/2014  1722 History   First MD Initiated Contact with Patient 01/17/2014 1737     Chief Complaint  Patient presents with  . Cardiac Arrest     (Consider location/radiation/quality/duration/timing/severity/associated sxs/prior Treatment) HPI Comments: Patient is 80 year old female with past medical history of coronary artery disease, stroke with resultant tracheostomy and gastrostomy tube dependence, heart failure, history of septic shock, nursing home resident who comes in with chief complaint of post cardiac arrest. Patient was at nursing home where per report she was hypotensive and bradycardic bradycardic. She was initially thought to be DO NOT RESUSCITATE however they spoke with her niece who is helped upon returning who informed her that she is full code ACLS efforts were initiated. Is unsure of what her rhythm was but she received 3 rounds of epinephrine as well as defibrillation and had Rosc. We have limited records from nursing home and had limited information about her condition before CPR.   Level V caveat as patient unresponsive  The history is limited by the condition of the patient (no records from nursing home).    Past Medical History  Diagnosis Date  . Hyperthyroidism     had RAI-is on thyroid replacement, happened maybe this summer  . Coronary artery disease ?, before 2005    Had a catheterization per son by Dr. Glennon [place] in 1991 [place].  note from 2005 mentions hx of coronary stent  . GERD (gastroesophageal reflux disease)   . Stroke 07/2012    Right hemiplegia, dysphagia, aphasia.  . Dementia   . Aphasia 07/2012  . Chronic systolic heart failure     07/2012 the EF was 15%  . Mural thrombus of cardiac apex   . Obesity   . IBS (irritable bowel syndrome)   . DVT (deep venous thrombosis) before 2005    occurred after trauma, treated for a while with Coumadin.   Marland Kitchen. Dysphagia due to recent stroke 07/2012    ok'd  for purree/pudding thick liquids but G tube placed by IR  . OSA (obstructive sleep apnea)     refused CPAP per notes.    Past Surgical History  Procedure Laterality Date  . Tee without cardioversion N/A 07/26/2012    Procedure: TRANSESOPHAGEAL ECHOCARDIOGRAM (TEE);  Surgeon: Laurey Moralealton S McLean, MD;  [facility];  Service: Cardiovascular;  Laterality: N/A;  . Gastric feeding tube  08/02/2012    placed in radiology   Family History  Problem Relation Age of Onset  . Acne Mother     died giving birth to 8th kid  . Dementia Father     died [id number]  . Acne Brother     died 2-3 yrs ago  . Cervical cancer Sister    History  Substance Use Topics  . Smoking status: Never Smoker   . Smokeless tobacco: Never Used  . Alcohol Use: No   OB History   Grav Para Term Preterm Abortions TAB SAB Ect Mult Living                 Review of Systems  Unable to perform ROS: Patient unresponsive      Allergies  Hyoscyamine; Iodine; Ivp dye; and Septra  Home Medications   Prior to Admission medications   Medication Sig Start Date End Date Taking? Authorizing Provider  acetaminophen (TYLENOL) 160 MG/5ML solution Place 20.3 mLs (650 mg total) into feeding tube every 4 (four) hours as needed for  fever. 12/12/12   Bernadene Person, NP  chlorhexidine (PERIDEX) 0.12 % solution Use as directed 15 mLs in the mouth or throat 2 (two) times daily. 12/12/12   Bernadene Person, NP  cholecalciferol (VITAMIN D) 1000 UNITS tablet Take 2 tablets (2,000 Units total) by mouth daily. 12/12/12   Bernadene Person, NP  ciprofloxacin (CIPRO) 400 MG/200ML SOLN Inject 200 mLs (400 mg total) into the vein every 12 (twelve) hours. 10/15/13   Vilinda Blanks Minor, NP  cyanocobalamin 500 MCG tablet Place 1 tablet (500 mcg total) into feeding tube daily. 12/12/12   Bernadene Person, NP  feeding supplement (PRO-STAT SUGAR FREE 64) LIQD Place 30 mLs into feeding tube 2 (two) times daily. 12/12/12   Bernadene Person, NP   fentaNYL (SUBLIMAZE) 0.05 MG/ML injection Inject 1 mL (50 mcg total) into the vein every 2 (two) hours as needed for severe pain. 10/15/13   Vilinda Blanks Minor, NP  heparin 5000 UNIT/ML injection Inject 1 mL (5,000 Units total) into the skin every 8 (eight) hours. 10/15/13   Vilinda Blanks Minor, NP  insulin aspart (NOVOLOG) 100 UNIT/ML injection Inject 0-9 Units into the skin every 4 (four) hours. 10/15/13   Vilinda Blanks Minor, NP  ipratropium-albuterol (DUONEB) 0.5-2.5 (3) MG/3ML SOLN Take 3 mLs by nebulization every 6 (six) hours.    Historical Provider, MD  levothyroxine (SYNTHROID, LEVOTHROID) 100 MCG tablet Take 100 mcg by mouth daily before breakfast.    Historical Provider, MD  midazolam (VERSED) 2 MG/2ML SOLN injection Inject 1 mL (1 mg total) into the vein every 15 (fifteen) minutes as needed for agitation (to achieve RASS goal). 10/15/13   Vilinda Blanks Minor, NP  Multiple Vitamin (MULTIVITAMIN WITH MINERALS) TABS tablet Give 1 tablet by tube daily.    Historical Provider, MD  norepinephrine 4 mg in dextrose 5 % 250 mL Inject 2-20 mcg/min into the vein continuous. 10/15/13   Vilinda Blanks Minor, NP  Nutritional Supplements (FEEDING SUPPLEMENT, VITAL HIGH PROTEIN,) LIQD liquid Place 1,000 mLs into feeding tube daily. 10/15/13   Vilinda Blanks Minor, NP  nystatin (MYCOSTATIN/NYSTOP) 100000 UNIT/GM POWD Apply 1 g topically 2 (two) times daily. Apply to groin and abdomenal folds    Historical Provider, MD  pantoprazole sodium (PROTONIX) 40 mg/20 mL PACK Place 20 mLs (40 mg total) into feeding tube daily. 10/15/13   Vilinda Blanks Minor, NP  piperacillin-tazobactam (ZOSYN) 3.375 GM/50ML IVPB Inject 50 mLs (3.375 g total) into the vein every 8 (eight) hours. 10/15/13   Vilinda Blanks Minor, NP  polyvinyl alcohol (LIQUIFILM TEARS) 1.4 % ophthalmic solution Place 1 drop into both eyes 4 (four) times daily. 12/12/12   Bernadene Person, NP  sodium chloride 0.9 % infusion Inject 100 mLs into the vein continuous. 10/15/13   Vilinda Blanks Minor, NP   vancomycin 1,250 mg in sodium chloride 0.9 % 250 mL Inject 1,250 mg into the vein daily. 10/15/13   Vilinda Blanks Minor, NP   BP 110/51  Pulse 96  Temp(Src) 93.2 F (34 C) (Core (Comment))  Resp 20  Ht 5\' 7"  (1.702 m)  SpO2 95% Physical Exam  Constitutional: She appears distressed.  Chronically ill elderly female. GCS 3 appears mottled on exam Worst mottled area is the right arm.   HENT:  Head: Normocephalic and atraumatic.  Mouth/Throat: Oropharynx is clear and moist. No oropharyngeal exudate.  Eyes: Conjunctivae are normal. Pupils are equal, round, and reactive to light. Right eye exhibits no discharge. Left eye exhibits no discharge.  No scleral icterus.  Neck: Neck supple.  Tracheostomy tube in place  Cardiovascular: Normal rate, regular rhythm and normal heart sounds.   No murmur heard. Appears poorly perfused on exam  Pulmonary/Chest: Effort normal and breath sounds normal. No respiratory distress. She has no wheezes. She has no rales.  On ventilator for respiratory support Breath sounds equal with bilateral rhonchi  Abdominal: Soft. She exhibits no distension and no mass. There is no tenderness.  Neurological: She exhibits abnormal muscle tone (hypotonia). Coordination normal.  Obtunded  Skin: Skin is warm. No rash noted. She is not diaphoretic.    ED Course  CENTRAL LINE Date/Time: Jan 22, 2014 8:03 PM Performed by: Pilar Jarvis Authorized by: Pilar Jarvis Consent: The procedure was performed in an emergent situation. Required items: required blood products, implants, devices, and special equipment available Patient identity confirmed: anonymous protocol, patient vented/unresponsive Time out: Immediately prior to procedure a "time out" was called to verify the correct patient, procedure, equipment, support staff and site/side marked as required. Indications: central pressure monitoring and vascular access Patient sedated: no Preparation: skin prepped with 2%  chlorhexidine Skin prep agent dried: skin prep agent completely dried prior to procedure Sterile barriers: all five maximum sterile barriers used - cap, mask, sterile gown, sterile gloves, and large sterile sheet Hand hygiene: hand hygiene performed prior to central venous catheter insertion Location details: right internal jugular Site selection rationale: quick access needed Patient position: flat Catheter type: triple lumen Pre-procedure: landmarks identified Ultrasound guidance: yes Number of attempts: 3 Successful placement: yes Post-procedure: line sutured and dressing applied Assessment: blood return through all ports and free fluid flow Patient tolerance: Patient tolerated the procedure well with no immediate complications.   (including critical care time) Labs Review Labs Reviewed  CBC WITH DIFFERENTIAL - Abnormal; Notable for the following:    RBC 3.75 (*)    Hemoglobin 10.4 (*)    HCT 34.1 (*)    RDW 16.7 (*)    Neutrophils Relative % 85 (*)    Neutro Abs 8.0 (*)    Monocytes Relative 1 (*)    All other components within normal limits  COMPREHENSIVE METABOLIC PANEL - Abnormal; Notable for the following:    CO2 18 (*)    Glucose, Bld 236 (*)    BUN 24 (*)    Albumin 2.3 (*)    AST 294 (*)    ALT 205 (*)    GFR calc non Af Amer 59 (*)    GFR calc Af Amer 69 (*)    Anion gap 25 (*)    All other components within normal limits  PROTIME-INR - Abnormal; Notable for the following:    Prothrombin Time 19.2 (*)    INR 1.62 (*)    All other components within normal limits  LACTIC ACID, PLASMA - Abnormal; Notable for the following:    Lactic Acid, Venous 12.9 (*)    All other components within normal limits  CBG MONITORING, ED - Abnormal; Notable for the following:    Glucose-Capillary 301 (*)    All other components within normal limits  I-STAT ARTERIAL BLOOD GAS, ED - Abnormal; Notable for the following:    pH, Arterial 7.288 (*)    pCO2 arterial 46.3 (*)    pO2,  Arterial 366.0 (*)    Acid-base deficit 5.0 (*)    All other components within normal limits  CULTURE, BLOOD (ROUTINE X 2)  CULTURE, BLOOD (ROUTINE X 2)  URINE CULTURE  CULTURE, RESPIRATORY (NON-EXPECTORATED)  TROPONIN  I  BLOOD GAS, ARTERIAL  URINALYSIS, ROUTINE W REFLEX MICROSCOPIC  CBC  CREATININE, SERUM  TROPONIN I  TROPONIN I  TROPONIN I  LACTIC ACID, PLASMA  LACTIC ACID, PLASMA  LACTIC ACID, PLASMA  PROCALCITONIN  CORTISOL  CBC  BLOOD GAS, ARTERIAL  BLOOD GAS, ARTERIAL  PROTIME-INR  COMPREHENSIVE METABOLIC PANEL    Imaging Review Dg Chest Port 1 View  02-01-14   CLINICAL DATA:  Patient was brought to the emergency room unresponsive.  EXAM: PORTABLE CHEST - 1 VIEW  COMPARISON:  October 17, 2013  FINDINGS: An endotracheal tube is identified distal tip 4.5 cm from carina. The heart size is enlarged. The aorta is tortuous. The mediastinal contour is normal. There is no pulmonary edema or focal pneumonia. There is probable small right pleural effusion. No acute abnormalities identified within the visualized bones.  IMPRESSION: Endotracheal tube as described. There is no pneumothorax. Probable small right pleural effusion.   Electronically Signed   By: Sherian Rein M.D.   On: 2014/02/01 18:36     EKG Interpretation None      MDM   MDM: Patient arrives as a post cardiac arrest. History is limited as no records available from nursing home with limited report given to EMS. On arrival patient had spontaneous circulation but is markedly hypotensive. Blood sugar normal in route. EKG with no STEMI. Large differential for hypotension. Central line placed in right femoral vein with starting of Levophed. Multiple liters of fluid given. Labs checked. Critical care team called for admission. Given Vanc and Zosyn for antibiotic coverage as patient prone to sepsis. Admit to ICU.  Final diagnoses:  Cardiac arrest    Admit to ICU  Pilar Jarvis, MD 02/01/14 2002  Pilar Jarvis,  MD 01-Feb-2014 2003

## 2014-01-18 NOTE — ED Notes (Signed)
Resident at bedside placing central line.

## 2014-01-18 NOTE — Progress Notes (Addendum)
ANTIBIOTIC CONSULT NOTE - INITIAL  Pharmacy Consult for Vancomycin and Zosyn Indication: pneumonia and sepsis  Allergies  Allergen Reactions  . Hyoscyamine Other (See Comments)    Per MAR  . Ivp Dye [Iodinated Diagnostic Agents] Other (See Comments)    Per MAR  . Septra [Sulfamethoxazole-Trimethoprim] Other (See Comments)    Per MAR    Patient Measurements: Height: 5\' 7"  (170.2 cm) IBW/kg (Calculated) : 61.6 kg Weight = 87.7 kg on 10/17/13 Awaiting admit weight   Vital Signs: Temp: 92.9 F (33.8 C) (10/11 1853) Temp Source: Rectal (10/11 1853) BP: 103/56 mmHg (10/11 1845) Pulse Rate: 95 (10/11 1845) Intake/Output from previous day:   Intake/Output from this shift:    Labs: No results found for this basename: WBC, HGB, PLT, LABCREA, CREATININE,  in the last 72 hours The CrCl is unknown because both a height and weight (above a minimum accepted value) are required for this calculation. No results found for this basename: VANCOTROUGH, VANCOPEAK, VANCORANDOM, GENTTROUGH, GENTPEAK, GENTRANDOM, TOBRATROUGH, TOBRAPEAK, TOBRARND, AMIKACINPEAK, AMIKACINTROU, AMIKACIN,  in the last 72 hours   Microbiology: No results found for this or any previous visit (from the past 720 hour(s)).  Medical History: Past Medical History  Diagnosis Date  . Hyperthyroidism     had RAI-is on thyroid replacement, happened maybe this summer  . Coronary artery disease ?, before 2005    Had a catheterization per son by Dr. [name] in 1991 no reports in  [place].  note from 2005 mentions hx of coronary stent  . GERD (gastroesophageal reflux disease)   . Stroke 07/2012    Right hemiplegia, dysphagia, aphasia.  . Dementia   . Aphasia 07/2012  . Chronic systolic heart failure     07/2012 the EF was 15%  . Mural thrombus of cardiac apex   . Obesity   . IBS (irritable bowel syndrome)   . DVT (deep venous thrombosis) before 2005    occurred after trauma, treated for a while with Coumadin.   Marland Kitchen  Dysphagia due to recent stroke 07/2012    ok'd for purree/pudding thick liquids but G tube placed by IR  . OSA (obstructive sleep apnea)     refused CPAP per notes.      Assessment: 80 y.o female, resident from [facility] presented with ventricular fibrillation. Pharmacy consulted for vancomycin and zosyn for PNA and sepsis.   SCr was 0.49 on 10/17/13  Goal of Therapy:  Vancomycin trough level 15-20 mcg/ml  Plan:  Zosyn 3.375 gm IV x1 now Vancomycin 1gm IV x1  Will follow up admission SCr and evaluate renal function then dose maintenance dose of vanc and zosyn.   Noah Delaine, RPh Clinical Pharmacist Pager: [phone] 02/03/2014,7:00 PM   Addendum:  Admission labs: SCR = 0.86 Estimaited CrCl ~ 40 ml/min  Plan:  Zosyn 3.375 gm IV q8h (EI) Vancomycin 750 mg IV q12hr after the initial 1gm dose  Noah Delaine, RPh Clinical Pharmacist Pager: [phone] 01/23/2014, 7:50 PM

## 2014-01-18 NOTE — H&P (Signed)
PULMONARY / CRITICAL CARE MEDICINE HISTORY AND PHYSICAL EXAMINATION   Name: Jenny Brady MRN: [id number] DOB: 1925/06/08    ADMISSION DATE:  01/30/2014  PRIMARY SERVICE: PCCM  CHIEF COMPLAINT:  Cardiac Arrest  BRIEF PATIENT DESCRIPTION: 3 F with ICM (EF 15%), Chronic Vent-Dependent RF, and CVA who presents after being transferred from [facility] for cardiac arrest. PCCM asked to admit.   SIGNIFICANT EVENTS / STUDIES:  Cardiac arrest, Per ED Note 01/17/2014 at 6:06 PM unclear if asystole or Vfib  LINES / TUBES: Femoral Catheter Placed 01/12/2014  CULTURES: Blood x 2 10/11 Urine 10/11 Sputum 10/11  ANTIBIOTICS: Vanc 10/11- Zosyn 10/11-  HISTORY OF PRESENT ILLNESS:  [name] is an 80 yo F with  ICM (EF 15%), Chronic Vent-Dependent RF, and CVA who was transferred to [facility] from [facility] on 01/23/2014 following an episode of cardiac arrest. The following was obtained from conversation with RNs, the patient's niece and POA,  and chart review as the patient is unable to provide history. By report, she had a cardiac arrest in the late afternoon of 10/11 with unclear rhythm. On discharge from [facility] in 10/2013 she was DNR but when her SNF contacted her niece who is her NOK she instructed them to begin CPR. When the contacted her they told her her aunt was blue with a heart rate of 19. It is unclear how long she was down before she was found. By report, she achieved ROSC after 3 round of CPR/epi and bicarb.   PAST MEDICAL HISTORY :  Past Medical History  Diagnosis Date  . Hyperthyroidism     had RAI-is on thyroid replacement, happened maybe this summer  . Coronary artery disease ?, before 2005    Had a catheterization per son by Dr. [name] in 1991 no reports in  [place].  note from 2005 mentions hx of coronary stent  . GERD (gastroesophageal reflux disease)   . Stroke 07/2012    Right hemiplegia, dysphagia, aphasia.  . Dementia   . Aphasia 07/2012  . Chronic systolic heart  failure     07/2012 the EF was 15%  . Mural thrombus of cardiac apex   . Obesity   . IBS (irritable bowel syndrome)   . DVT (deep venous thrombosis) before 2005    occurred after trauma, treated for a while with Coumadin.   Marland Kitchen Dysphagia due to recent stroke 07/2012    ok'd for purree/pudding thick liquids but G tube placed by IR  . OSA (obstructive sleep apnea)     refused CPAP per notes.    Past Surgical History  Procedure Laterality Date  . Tee without cardioversion N/A 07/26/2012    Procedure: TRANSESOPHAGEAL ECHOCARDIOGRAM (TEE);  Surgeon: Laurey Morale, MD;  Location: [facility] ENDOSCOPY;  Service: Cardiovascular;  Laterality: N/A;  . Gastric feeding tube  08/02/2012    placed in radiology   Prior to Admission medications   Medication Sig Start Date End Date Taking? Authorizing Provider  acetaminophen (TYLENOL) 160 MG/5ML solution Place 20.3 mLs (650 mg total) into feeding tube every 4 (four) hours as needed for fever. 12/12/12   Bernadene Person, NP  chlorhexidine (PERIDEX) 0.12 % solution Use as directed 15 mLs in the mouth or throat 2 (two) times daily. 12/12/12   Bernadene Person, NP  cholecalciferol (VITAMIN D) 1000 UNITS tablet Take 2 tablets (2,000 Units total) by mouth daily. 12/12/12   Bernadene Person, NP  ciprofloxacin (CIPRO) 400 MG/200ML SOLN Inject 200 mLs (400  mg total) into the vein every 12 (twelve) hours. 10/15/13   Vilinda Blanks Minor, NP  cyanocobalamin 500 MCG tablet Place 1 tablet (500 mcg total) into feeding tube daily. 12/12/12   Bernadene Person, NP  feeding supplement (PRO-STAT SUGAR FREE 64) LIQD Place 30 mLs into feeding tube 2 (two) times daily. 12/12/12   Bernadene Person, NP  fentaNYL (SUBLIMAZE) 0.05 MG/ML injection Inject 1 mL (50 mcg total) into the vein every 2 (two) hours as needed for severe pain. 10/15/13   Vilinda Blanks Minor, NP  heparin 5000 UNIT/ML injection Inject 1 mL (5,000 Units total) into the skin every 8 (eight) hours. 10/15/13   Vilinda Blanks Minor, NP   insulin aspart (NOVOLOG) 100 UNIT/ML injection Inject 0-9 Units into the skin every 4 (four) hours. 10/15/13   Vilinda Blanks Minor, NP  ipratropium-albuterol (DUONEB) 0.5-2.5 (3) MG/3ML SOLN Take 3 mLs by nebulization every 6 (six) hours.    Historical Provider, MD  levothyroxine (SYNTHROID, LEVOTHROID) 100 MCG tablet Take 100 mcg by mouth daily before breakfast.    Historical Provider, MD  midazolam (VERSED) 2 MG/2ML SOLN injection Inject 1 mL (1 mg total) into the vein every 15 (fifteen) minutes as needed for agitation (to achieve RASS goal). 10/15/13   Vilinda Blanks Minor, NP  Multiple Vitamin (MULTIVITAMIN WITH MINERALS) TABS tablet Give 1 tablet by tube daily.    Historical Provider, MD  norepinephrine 4 mg in dextrose 5 % 250 mL Inject 2-20 mcg/min into the vein continuous. 10/15/13   Vilinda Blanks Minor, NP  Nutritional Supplements (FEEDING SUPPLEMENT, VITAL HIGH PROTEIN,) LIQD liquid Place 1,000 mLs into feeding tube daily. 10/15/13   Vilinda Blanks Minor, NP  nystatin (MYCOSTATIN/NYSTOP) 100000 UNIT/GM POWD Apply 1 g topically 2 (two) times daily. Apply to groin and abdomenal folds    Historical Provider, MD  pantoprazole sodium (PROTONIX) 40 mg/20 mL PACK Place 20 mLs (40 mg total) into feeding tube daily. 10/15/13   Vilinda Blanks Minor, NP  piperacillin-tazobactam (ZOSYN) 3.375 GM/50ML IVPB Inject 50 mLs (3.375 g total) into the vein every 8 (eight) hours. 10/15/13   Vilinda Blanks Minor, NP  polyvinyl alcohol (LIQUIFILM TEARS) 1.4 % ophthalmic solution Place 1 drop into both eyes 4 (four) times daily. 12/12/12   Bernadene Person, NP  sodium chloride 0.9 % infusion Inject 100 mLs into the vein continuous. 10/15/13   Vilinda Blanks Minor, NP  vancomycin 1,250 mg in sodium chloride 0.9 % 250 mL Inject 1,250 mg into the vein daily. 10/15/13   Vilinda Blanks Minor, NP   Allergies  Allergen Reactions  . Hyoscyamine Other (See Comments)    Per MAR  . Ivp Dye [Iodinated Diagnostic Agents] Other (See Comments)    Per MAR  . Septra  [Sulfamethoxazole-Trimethoprim] Other (See Comments)    Per MAR    FAMILY HISTORY:  Family History  Problem Relation Age of Onset  . Acne Mother     died giving birth to 8th kid  . Dementia Father     died 22  . Acne Brother     died 2-3 yrs ago  . Cervical cancer Sister    SOCIAL HISTORY:  reports that she has never smoked. She has never used smokeless tobacco. She reports that she does not drink alcohol or use illicit drugs.  REVIEW OF SYSTEMS:  Unable to obtain secondary to patient condition.  SUBJECTIVE:   VITAL SIGNS: Temp:  [92.9 F (33.8 C)] 92.9 F (33.8 C) (10/11 1853) Pulse Rate:  [  90-95] 95 (10/11 1845) Resp:  [16-20] 20 (10/11 1845) BP: (73-105)/(35-57) 103/56 mmHg (10/11 1845) SpO2:  [95 %-98 %] 95 % (10/11 1845) FiO2 (%):  [50 %-100 %] 50 % (10/11 1848) HEMODYNAMICS:   VENTILATOR SETTINGS: Vent Mode:  [-] PRVC FiO2 (%):  [50 %-100 %] 50 % Set Rate:  [16 bmp-20 bmp] 20 bmp Vt Set:  [460 mL-490 mL] 460 mL PEEP:  [5 cmH20] 5 cmH20 Plateau Pressure:  [20 cmH20] 20 cmH20 INTAKE / OUTPUT: Intake/Output   None     PHYSICAL EXAMINATION: General:  Chronically Ill Appearing F Neuro:  Pupils fixed and dilated, No corneal reflex, No reaction to suctioning, (+) Doll's eyes HEENT:  Sclera anicteric, conjunctiva pink, MM Dry Neck: Tracheostomy present Cardiovascular:  RRR, NS1/S2, (-) MRG,  Lungs:  Coarse mechanical BS bilaterally Abdomen:  S/NT/ND/(+)BS Musculoskeletal:  (-) C/C/E Skin:  Intact  LABS:  CBC  Recent Labs Lab 02/06/2014 1810  WBC 9.5  HGB 10.4*  HCT 34.1*  PLT 197   Coag's No results found for this basename: APTT, INR,  in the last 168 hours BMET No results found for this basename: NA, K, CL, CO2, BUN, CREATININE, GLUCOSE,  in the last 168 hours Electrolytes No results found for this basename: CALCIUM, MG, PHOS,  in the last 168 hours Sepsis Markers No results found for this basename: LATICACIDVEN, PROCALCITON, O2SATVEN,  in the  last 168 hours ABG  Recent Labs Lab 02/05/2014 1836  PHART 7.288*  PCO2ART 46.3*  PO2ART 366.0*   Liver Enzymes No results found for this basename: AST, ALT, ALKPHOS, BILITOT, ALBUMIN,  in the last 168 hours Cardiac Enzymes No results found for this basename: TROPONINI, PROBNP,  in the last 168 hours Glucose  Recent Labs Lab 01/12/2014 1725  GLUCAP 301*    Imaging Dg Chest Port 1 View  01/26/2014   CLINICAL DATA:  Patient was brought to the emergency room unresponsive.  EXAM: PORTABLE CHEST - 1 VIEW  COMPARISON:  October 17, 2013  FINDINGS: An endotracheal tube is identified distal tip 4.5 cm from carina. The heart size is enlarged. The aorta is tortuous. The mediastinal contour is normal. There is no pulmonary edema or focal pneumonia. There is probable small right pleural effusion. No acute abnormalities identified within the visualized bones.  IMPRESSION: Endotracheal tube as described. There is no pneumothorax. Probable small right pleural effusion.   Electronically Signed   By: Sherian ReinWei-Chen  Lin M.D.   On: 01/12/2014 18:36    EKG: Personally reviewed. ? New TWI and ST Depressions in Inferolateral leads.  CXR: Personally reviewed. Indistinct R base unchanged from 10/14/2013. ? Pleural effusion.   ASSESSMENT / PLAN:  Active Problems:   Ventricular fibrillation   PULMONARY A: Chronic VDRF: Slight hypercarbia currently of unclear etiology.  P:   Cont Vent Support VAP prevention Repeat ABG in 60 min  CARDIOVASCULAR A: Cardiac Arrest: Etiology unclear. Thus far highest index of suspicion for respiratory process vs. cardiac process. Sepsis also possible given patient's repeated presentations for same. Intracranial process possible as well. Not cooling candidate given severe dementia.    ICM: Does not appear fluid overloaded, actually appears dry. S/p 1L IVF. Will try additional fluid and evaluate effect on pressor requirement. P:   Serial Trops Serial Lactates Gentle  IVF Continue pressor support to maintain MAP of 65 A-line placement Head, Chest, Abdomen, Pelvis CT to evaluate for etiology Potential Cardiology consult pending troponins  RENAL A: AKI: Likely 2/2 Cardiac Arrest: Acute Respiratory and  Metabolic Acidosis:  P:   Serial ABGs Serial BMPs Follow-up electrolytes as not yet resulted  GASTROINTESTINAL A: GERD Hx:  P:   PPI  HEMATOLOGIC A: AOCD: HgB Stable Elevated INR: Pt. No longer on Coumadin per available records. This is likely a reflection of liver injury. P:   Serial INRs Follow-up LFTs  INFECTIOUS A: No concrete evidence of infection yet but will empirically cover for HCAP. P:   Cont Empiric Vanc and Zosyn  ENDOCRINE A: DM:  P:   SSI  NEUROLOGIC A: Lack of CN reflexes: Very concerning for a severe anoxic injury. Currently hypothermic, however, if persists when euthermic may require brain death exam.  CVA Hx: Dementia: Aphasia P:   Serial neuro exams  BEST PRACTICE / DISPOSITION Level of Care:  ICU Primary Service:  PCCM Consultants:  None yet Code Status:  Full, discussed GOC with Dr. Sherald Hess, POA [phone]). She is coming in this evening. Diet:  NPO DVT Px:  SQH GI Px:  PPI Skin Integrity:  Intact Social / Family:  Updated by AB 02/01/2014 at 7:30 pm  TODAY'S SUMMARY:   I have personally obtained a history, examined the patient, evaluated laboratory and imaging results, formulated the assessment and plan and placed orders.  CRITICAL CARE: The patient is critically ill with multiple organ systems failure and requires high complexity decision making for assessment and support, frequent evaluation and titration of therapies, application of advanced monitoring technologies and extensive interpretation of multiple databases. Critical Care Time devoted to patient care services described in this note is 60 minutes.   Evalyn Casco, MD Pulmonary and Critical Care Medicine [facility] Pager: [phone]   2014-02-01, 7:15 PM

## 2014-01-19 ENCOUNTER — Inpatient Hospital Stay (HOSPITAL_COMMUNITY): Payer: Medicare Other

## 2014-01-19 DIAGNOSIS — I469 Cardiac arrest, cause unspecified: Secondary | ICD-10-CM

## 2014-01-19 DIAGNOSIS — R579 Shock, unspecified: Secondary | ICD-10-CM

## 2014-01-19 DIAGNOSIS — G9382 Brain death: Secondary | ICD-10-CM

## 2014-01-19 LAB — CBC
HCT: 40.9 % (ref 36.0–46.0)
HEMOGLOBIN: 13.2 g/dL (ref 12.0–15.0)
MCH: 28.3 pg (ref 26.0–34.0)
MCHC: 32.3 g/dL (ref 30.0–36.0)
MCV: 87.8 fL (ref 78.0–100.0)
Platelets: 211 10*3/uL (ref 150–400)
RBC: 4.66 MIL/uL (ref 3.87–5.11)
RDW: 16.5 % — ABNORMAL HIGH (ref 11.5–15.5)
WBC: 12.9 10*3/uL — ABNORMAL HIGH (ref 4.0–10.5)

## 2014-01-19 LAB — PROTIME-INR
INR: 2 — ABNORMAL HIGH (ref 0.00–1.49)
Prothrombin Time: 22.7 seconds — ABNORMAL HIGH (ref 11.6–15.2)

## 2014-01-19 LAB — POCT I-STAT 3, ART BLOOD GAS (G3+)
Acid-base deficit: 3 mmol/L — ABNORMAL HIGH (ref 0.0–2.0)
Bicarbonate: 20.7 mEq/L (ref 20.0–24.0)
O2 Saturation: 99 %
PCO2 ART: 33.4 mmHg — AB (ref 35.0–45.0)
PH ART: 7.4 (ref 7.350–7.450)
PO2 ART: 134 mmHg — AB (ref 80.0–100.0)
Patient temperature: 98.6
TCO2: 22 mmol/L (ref 0–100)

## 2014-01-19 LAB — COMPREHENSIVE METABOLIC PANEL
ALT: 293 U/L — ABNORMAL HIGH (ref 0–35)
ANION GAP: 20 — AB (ref 5–15)
AST: 556 U/L — ABNORMAL HIGH (ref 0–37)
Albumin: 2.7 g/dL — ABNORMAL LOW (ref 3.5–5.2)
Alkaline Phosphatase: 135 U/L — ABNORMAL HIGH (ref 39–117)
BUN: 32 mg/dL — ABNORMAL HIGH (ref 6–23)
CALCIUM: 8.6 mg/dL (ref 8.4–10.5)
CO2: 22 mEq/L (ref 19–32)
Chloride: 98 mEq/L (ref 96–112)
Creatinine, Ser: 1.02 mg/dL (ref 0.50–1.10)
GFR, EST AFRICAN AMERICAN: 56 mL/min — AB (ref >90)
GFR, EST NON AFRICAN AMERICAN: 48 mL/min — AB (ref >90)
Glucose, Bld: 114 mg/dL — ABNORMAL HIGH (ref 70–99)
Potassium: 3.3 mEq/L — ABNORMAL LOW (ref 3.7–5.3)
Sodium: 140 mEq/L (ref 137–147)
Total Bilirubin: 0.6 mg/dL (ref 0.3–1.2)
Total Protein: 8 g/dL (ref 6.0–8.3)

## 2014-01-19 LAB — GLUCOSE, CAPILLARY
GLUCOSE-CAPILLARY: 112 mg/dL — AB (ref 70–99)
Glucose-Capillary: 126 mg/dL — ABNORMAL HIGH (ref 70–99)

## 2014-01-19 LAB — TROPONIN I: Troponin I: <0.3 ng/mL (ref <0.30)

## 2014-01-19 LAB — CORTISOL: Cortisol, Plasma: 21.8 ug/dL

## 2014-01-19 LAB — LACTIC ACID, PLASMA: Lactic Acid, Venous: 5.3 mmol/L — ABNORMAL HIGH (ref 0.5–2.2)

## 2014-01-19 LAB — MRSA PCR SCREENING: MRSA by PCR: POSITIVE — AB

## 2014-01-19 LAB — CLOSTRIDIUM DIFFICILE BY PCR: Toxigenic C. Difficile by PCR: NEGATIVE

## 2014-01-19 MED ORDER — ACETAMINOPHEN 325 MG PO TABS
650.0000 mg | ORAL_TABLET | Freq: Four times a day (QID) | ORAL | Status: DC | PRN
  Administered 2014-01-19: 650 mg via ORAL
  Filled 2014-01-19: qty 2

## 2014-01-19 MED ORDER — DEXTROSE 5 % IV SOLN
0.0000 ug/min | INTRAVENOUS | Status: DC
  Administered 2014-01-19 (×2): 50 ug/min via INTRAVENOUS
  Administered 2014-01-19: 80 ug/min via INTRAVENOUS
  Administered 2014-01-20 – 2014-01-21 (×6): 50 ug/min via INTRAVENOUS
  Filled 2014-01-19 (×8): qty 16

## 2014-01-19 MED ORDER — SODIUM CHLORIDE 0.9 % IJ SOLN
10.0000 mL | Freq: Two times a day (BID) | INTRAMUSCULAR | Status: DC
  Administered 2014-01-19 – 2014-01-20 (×2): 10 mL
  Administered 2014-01-20: 20 mL
  Administered 2014-01-21: 10 mL

## 2014-01-19 MED ORDER — SODIUM CHLORIDE 0.9 % IV BOLUS (SEPSIS)
500.0000 mL | Freq: Once | INTRAVENOUS | Status: AC
  Administered 2014-01-19: 500 mL via INTRAVENOUS

## 2014-01-19 MED ORDER — VANCOMYCIN HCL IN DEXTROSE 1-5 GM/200ML-% IV SOLN: 1000.0000 mg | INTRAVENOUS | Status: DC

## 2014-01-19 MED ORDER — SODIUM CHLORIDE 0.9 % IJ SOLN: 10.0000 mL | INTRAMUSCULAR | Status: DC | PRN

## 2014-01-19 NOTE — Plan of Care (Signed)
Problem: Phase I Progression Outcomes Goal: Hemodynamically stable Outcome: Not Met (add Reason) Pt hypotensive, maxed out on Levophed, made DNR today, no additional aggressive medical treatment per MD

## 2014-01-19 NOTE — Procedures (Signed)
ELECTROENCEPHALOGRAM REPORT   Patient: Jenny Brady       Room #: 7C58 EEG No. ID: [id number] Age: 80 y.o.        Sex: female Referring Physician: Kendrick Fries Report Date:  01/19/2014        Interpreting Physician: Thana Farr D  History: [name] is an 80 y.o. female unresponsive s/p cardiac arrest  Medications:  Scheduled: . antiseptic oral rinse  7 mL Mouth Rinse QID  . chlorhexidine  15 mL Mouth Rinse BID  . pantoprazole (PROTONIX) IV  40 mg Intravenous Q24H  . sodium chloride  10-40 mL Intracatheter Q12H    Conditions of Recording:  This is a 16 channel EEG carried out with the patient in the intubated and unresponsive state.  Description:  The initial portion of the recording was performed at a sensitivity of 7uV/mm.  At this sensitivity no background activity can be appreciated.  The sensitivity is then increased to 2uV/mm.  At this sensitivity the majority of the background remains markedly attenuated.  There are periods when a very low voltage slow polymorphic delta rhythm can be seen, mostly from the temporal regions bilaterally.  On rare occasions is also noted a left frontal sharp transient with phase reversal at F7. There was no evidence of normal drowse or sleep.  Hyperventilation and intermittent photic stimulation were not performed.  Painful stimulation was applied with no change in the background activity.    IMPRESSION: This is an abnormal electroencephalogram secondary to markedly attenuated background activity with sparse slow superimposed activity seen at times.  This finding is consistent with severe diffuse cerebral injury.  Also noted is rare left frontal sharp activity.     Thana Farr, MD [facility] [phone] 01/19/2014, 1:51 PM

## 2014-01-19 NOTE — Progress Notes (Signed)
STAT EEG completed; results pending. 

## 2014-01-19 NOTE — Progress Notes (Signed)
[facility] notified of referrall. Spoke with Hurshel Party and [place].

## 2014-01-19 NOTE — Progress Notes (Signed)
Comatose. Fully unresponsive. Hypotension on max dose of vasopressors  Filed Vitals:   01/19/14 0900 01/19/14 0930 01/19/14 1000 01/19/14 1100  BP: 86/42 89/58 43/27  41/17  Pulse: 131 130 60 116  Temp: 101.7 F (38.7 C) 102 F (38.9 C)    TempSrc:      Resp: 20 13 16 15   Height:      Weight:      SpO2: 97% 98% 96% 97%   Comatose Pupils fixed and dilated No wheezes RRR abd soft Ext diffusely cool R hand cold, violaceous and pulseless  I have reviewed all of today's lab results. Relevant abnormalities are discussed in the A/P section   CXR reviewed  IMPRESSION: Cardiac arrest - unclear etiology Chronic VDRF Ischemic cardiomyopathy Recent devastating CVA Dementia AKI Refractory shock DM 2 Exam consistent with brain death  PLAN: Discussed with pt's niece who is local plastic surgeon Dr Sherald Hess She requests EEG as confirmatory test - ordered I established that we are not going to add anything to our current level of support I have made her DNR if she arrests again and informed Dr Sherald Hess Dr Sherald Hess is to contact priest for last rites and son to offer him the option of visiting if he wishes Upper limit of vasopressors established with RN  I will be available to speak with family once they arrive   40 mins CCM time   Billy Fischer, MD ; [facility] service Mobile [phone].  After 5:30 PM or weekends, call [phone]

## 2014-01-19 NOTE — Progress Notes (Signed)
Spoke with Dr. Vassie Loll CCMD about pt's worsening hypotension at 0300 despite being maxed out on Levophed. MD aware of Levophed infusing at higher dose than ordered. MD did not want to add other pressors or medication for hemodynamic support at this time. Received order for 500 mL NS bolus. Will continue to monitor. Gregor Hams, RN

## 2014-01-20 DIAGNOSIS — J9620 Acute and chronic respiratory failure, unspecified whether with hypoxia or hypercapnia: Secondary | ICD-10-CM

## 2014-01-20 MED ORDER — PERFLUTREN LIPID MICROSPHERE
INTRAVENOUS | Status: AC
  Filled 2014-01-20: qty 10

## 2014-01-20 MED ORDER — ACETAMINOPHEN 325 MG PO TABS
650.0000 mg | ORAL_TABLET | Freq: Four times a day (QID) | ORAL | Status: DC | PRN
  Administered 2014-01-20: 650 mg
  Filled 2014-01-20: qty 2

## 2014-01-20 NOTE — Progress Notes (Signed)
Exam remains consistent with brain death Family has been unable to reach pt's son Niece indicates desire to continue vent support until she can get her mother there again tonight I indicated that we should put a time limit on this and that we will proceed with discontinuation of support in AM 10/14  Jenny Fischer, MD ; [facility] service Mobile [phone].  After 5:30 PM or weekends, call [phone]

## 2014-01-20 NOTE — Progress Notes (Signed)
Family on 10/12 was at bedside and aware and educated regarding need for PPE.

## 2014-01-20 NOTE — ED Provider Notes (Signed)
See my note  I performed a history and physical examination of Jenny Brady and discussed her management with Dr. Marcha Solders.  I agree with the history, physical, assessment, and plan of care, with the following exceptions: None  I was present for the following procedures:central line placement  Time Spent in Critical Care of the patient: 60 Time spent in discussions with the patient and family: 57  Roselyn Doby Corlis Leak, MD 01/20/14 1525

## 2014-01-20 NOTE — Progress Notes (Signed)
Spoke with niece Kaddie Liotta [facility]) regarding the patient's son [name]. The niece stated that the son could receive updates regarding the patient's condition but could not make any decisions regarding care. [name] did call and was updated that the patient was stable but on life support at this time.

## 2014-01-20 NOTE — Progress Notes (Signed)
  Nutrition Brief Note  Chart reviewed. Plan for withdrawal 10/14.  No further nutrition interventions warranted at this time.  Please re-consult as needed.   Kendell Bane RD, LDN, CNSC [phone] Pager [phone] After Hours Pager

## 2014-01-21 LAB — CULTURE, BLOOD (ROUTINE X 2)

## 2014-01-22 LAB — URINE CULTURE: Colony Count: >=100000

## 2014-01-25 LAB — CULTURE, BLOOD (ROUTINE X 2): Culture: NO GROWTH

## 2014-02-08 NOTE — Progress Notes (Addendum)
No heart sounds or breath sounds auscultated for one minute with Janice Norrie, RN MD and [facility] notified.   Alba Destine, RN, BSN  Suzzette Righter, RN

## 2014-02-08 NOTE — Progress Notes (Signed)
Dr. Sung Amabile was notified about pt death. He gave order to remove vent and pronounce time of death.   Alba Destine, RN, BSN

## 2014-02-08 NOTE — Progress Notes (Signed)
Called by RN to bedside d/t pt asystole- pt death.  Ventilator turned off per RN request.  Family at bedside.  Jenny Brady remained in place on patient.

## 2014-02-08 NOTE — Progress Notes (Signed)
I expected vent to be discontinued after Dr Sherald Hess and her mother visited last night. I have communicated unequivocally that Jenny Brady is brain dead. [name] reportedly called last PM and was unpleasant with RN staff. I will not seek out communication with him as he has demonstrated repeatedly an unwillingness to be a part of decision making. He has had opportunity to be here and has apparently chosen not to be.  Will DC norepinephrine and tunr FiO2 to 21% No further medical interventions are to be undertaken.  I will continue to try to reach Dr Sherald Hess  Billy Fischer, MD ; [facility] [phone].  After 5:30 PM or weekends, call [phone]

## 2014-02-08 NOTE — Care Management Note (Addendum)
    Page 1 of 1   01/20/2014     10:35:06 AM CARE MANAGEMENT NOTE 01/27/2014  Patient:  BROCK, BARTEN   Account Number:  [id number]  Date Initiated:  01/19/2014  Documentation initiated by:  Junius Creamer  Subjective/Objective Assessment:   adm w v fib-vent     Action/Plan:   from [facility]   Anticipated DC Date:     Anticipated DC Plan:    In-house referral  Clinical Social Worker         Choice offered to / List presented to:             Status of service:   Medicare Important Message given?  YES (If response is "NO", the following Medicare IM given date fields will be blank) Date Medicare IM given:  02/04/2014 Medicare IM given by:  Junius Creamer Date Additional Medicare IM given:   Additional Medicare IM given by:    Discharge Disposition:    Per UR Regulation:  Reviewed for med. necessity/level of care/duration of stay  If discussed at Long Length of Stay Meetings, dates discussed:    Comments:

## 2014-02-08 NOTE — Discharge Summary (Signed)
DEATH SUMMARY  DATE OF ADMISSION:  15-Feb-2014  DATE OF DISCHARGE/DEATH:  02/18/14  ADMISSION DIAGNOSES:   Cardiac arrest Ischemic cardiomyopathy Chronic vent dependent resp failure AKI Metabolic acidosis GERD Anemia of chronic disease Prolonged prothrombin time Hx of CVA Dementia  DISCHARGE DIAGNOSES:   Cardiac arrest Ischemic cardiomyopathy Shock, likely cardiogenic Chronic vent dependent resp failure AKI Metabolic acidosis GERD Anemia of chronic disease Prolonged prothrombin time Hx of CVA Dementia Anoxic brain injury Brain death   PRESENTATION:   Pt was admitted with the following HPI and the above admission diagnoses:  HISTORY OF PRESENT ILLNESS: Jenny Brady is an 80 yo F with ICM (EF 15%), Chronic Vent-Dependent RF, and CVA who was transferred to [facility] from [facility] on February 15, 2014 following an episode of cardiac arrest. The following was obtained from conversation with RNs, the patient's niece and POA, and chart review as the patient is unable to provide history. By report, she had a cardiac arrest in the late afternoon of Feb 16, 2023 with unclear rhythm. On discharge from [facility] in 10/2013 she was DNR but when her SNF contacted her niece who is her NOK she instructed them to begin CPR. When the contacted her they told her her aunt was blue with a heart rate of 19. It is unclear how long she was down before she was found. By report, she achieved ROSC after 3 round of CPR/epi and bicarb.    HOSPITAL COURSE:   Pt was admitted with above hx and diagnoses. Her initial exam was consistent with very severe cerebral injury. Her exam on the day following admission was consistent with brain death. At the request of her family, an EEG was performed which revealed minimal electrical activity essentially supporting the diagnosis of brain death due to anoxic brain injury. This was communicated to the family. They asked for an opportunity to get her estranged son to the hospital prior to  discontinuation of vasopressors and vent support. Her son was finally contacted and he did call the unit, speaking to the bedside RN on the evening of 10/13. He indicated no intention of being physically present @ the bedside. On the morning of 02-19-23, vasopressors were discontinued and her blood pressure gradually dropped culminating in asytole   Cause of death:  Anoxic brain injury due to prolonged cardiac arrest Likely initial rhythm was ventricular fibrillation due to severe ischemic cardiomyopathy  Contributing factors: Chronic ventilator dependence Hx of CVA Dementia  Autopsy:  No   Billy Fischer, MD;  PCCM service; Mobile [phone]

## 2014-02-08 DEATH — deceased

## 2014-11-28 IMAGING — CT CT HEAD W/O CM
1 of 2 series · 15 of 30 positions shown, 19 images · non-contrast
Comparison: Prior CT scan of the head 10/18/2012

CLINICAL DATA: Severe headache

CT HEAD WITHOUT CONTRAST
TECHNIQUE: Contiguous axial images were obtained from the base of
the skull through the vertex without contrast.

[Series 2: head 5.0 h30s · axial · 0.40mm/px · z∈[+1413,+1543]mm · 15 of 30 slices shown, 19 images]
[im 2/30  brain]
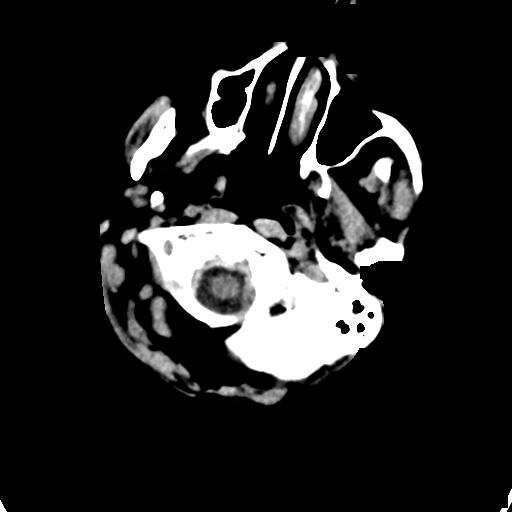
[im 2/30  bone]
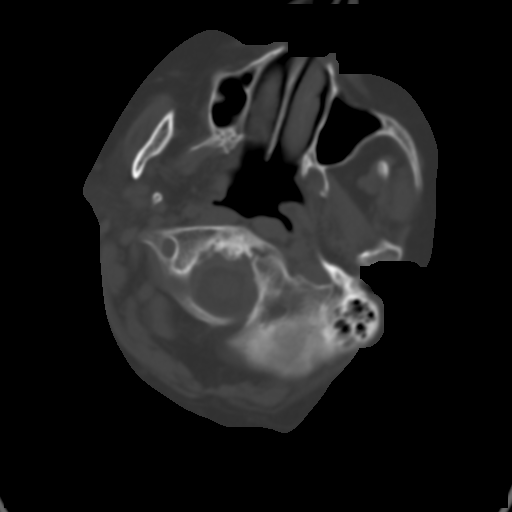
[im 4/30  brain]
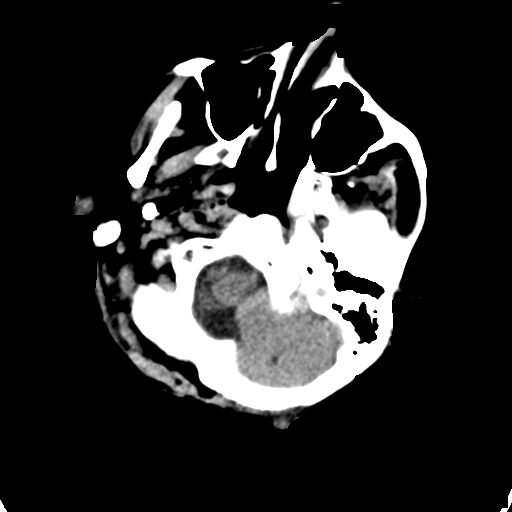
[im 7/30  brain]
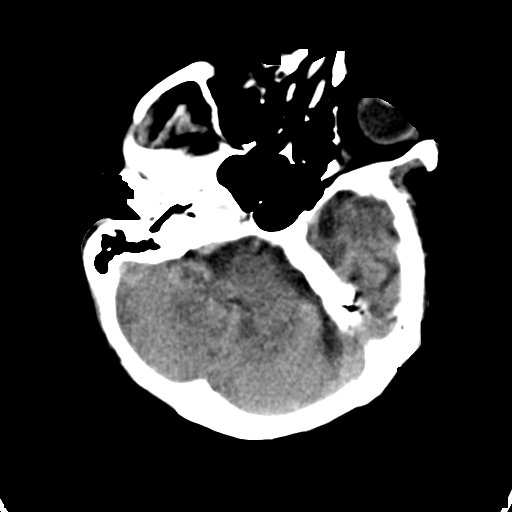
[im 8/30  brain]
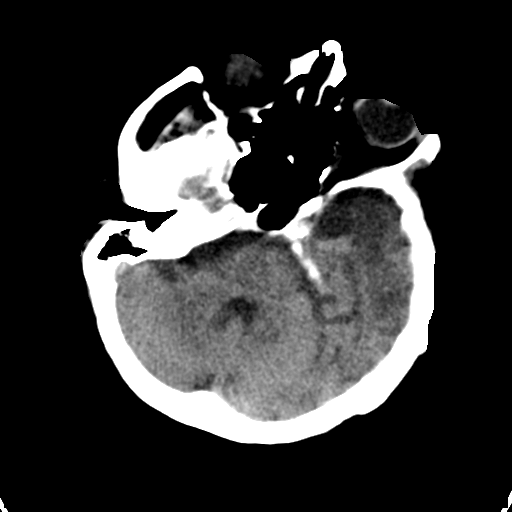
[im 10/30  brain]
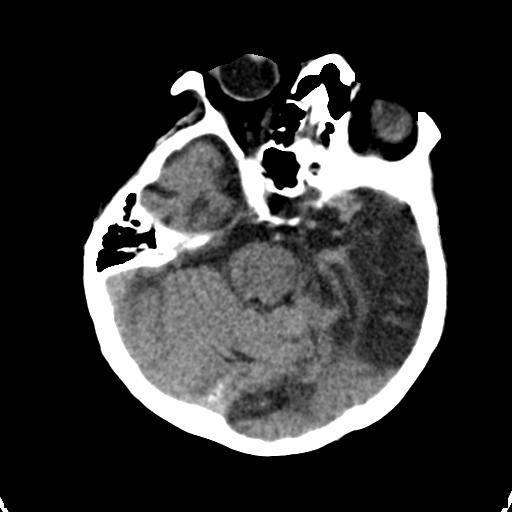
[im 10/30  bone]
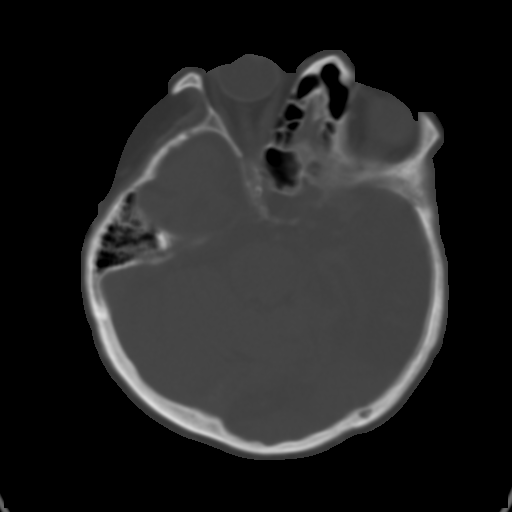
[im 11/30  brain]
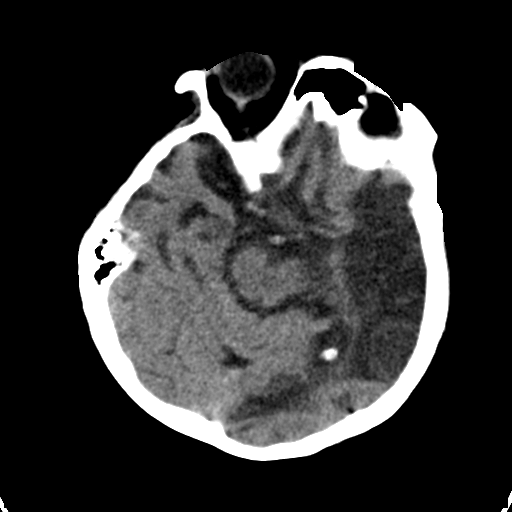
[im 13/30  brain]
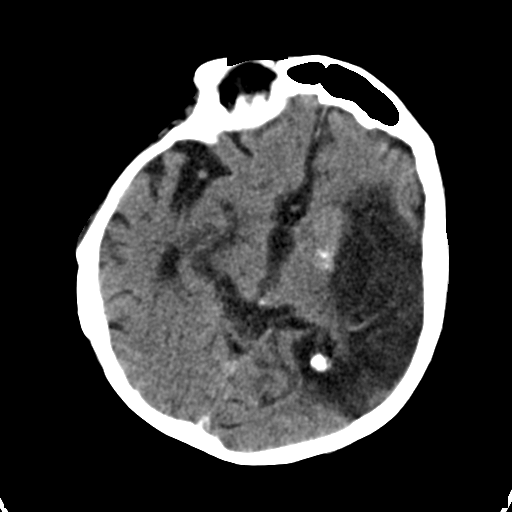
[im 16/30  brain]
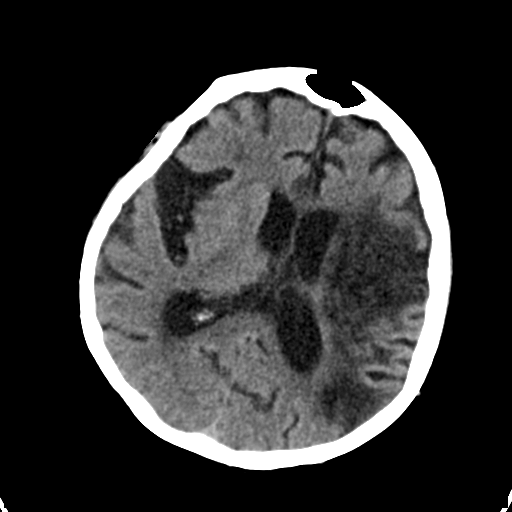
[im 17/30  brain]
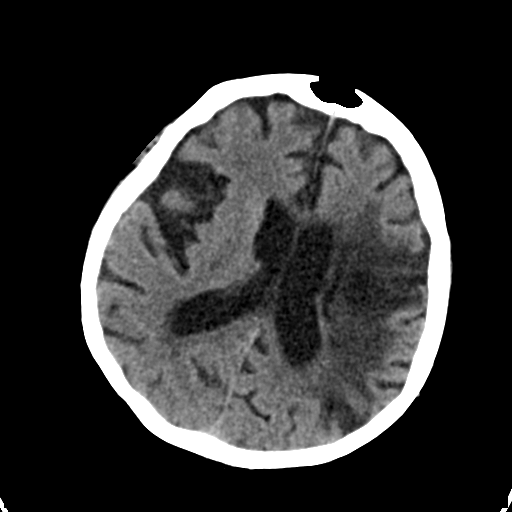
[im 17/30  bone]
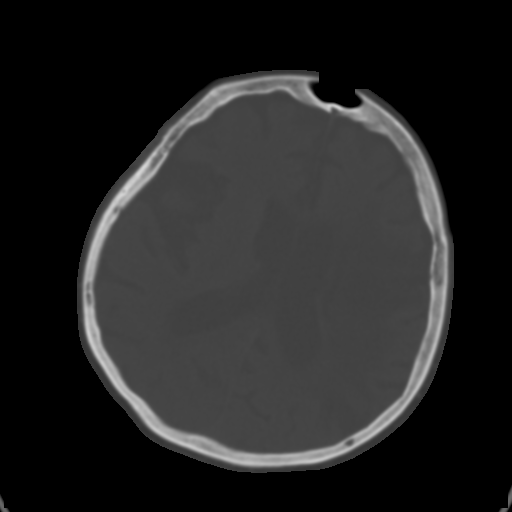
[im 19/30  brain]
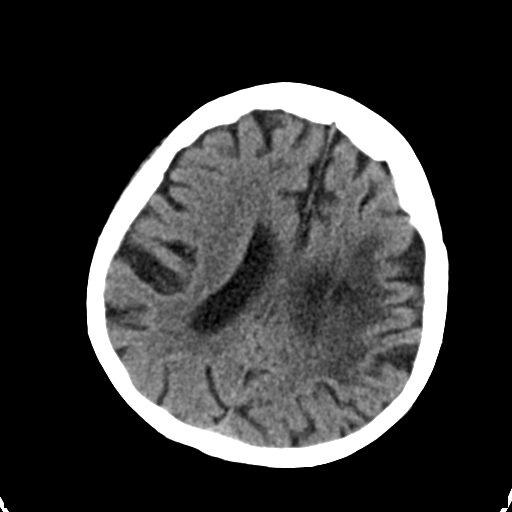
[im 20/30  brain]
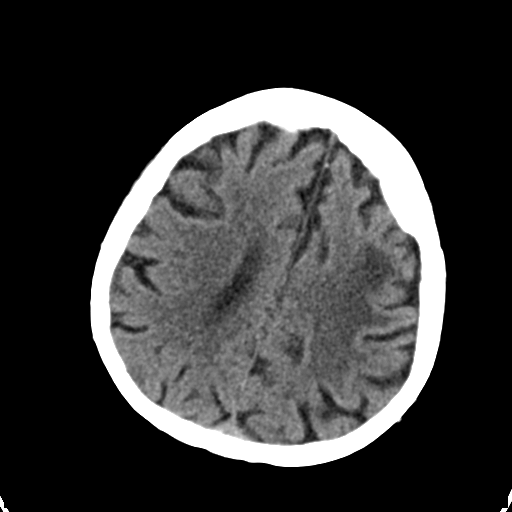
[im 22/30  brain]
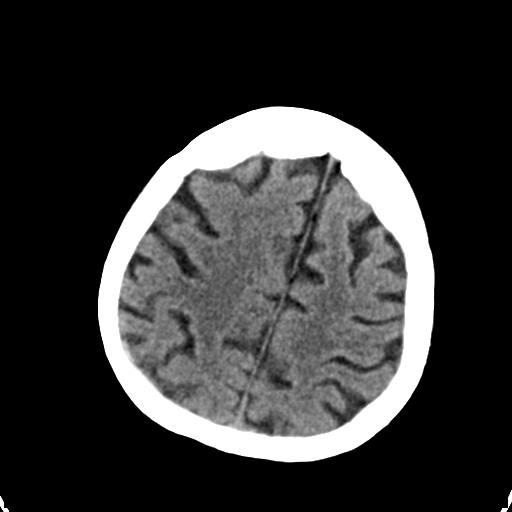
[im 25/30  brain]
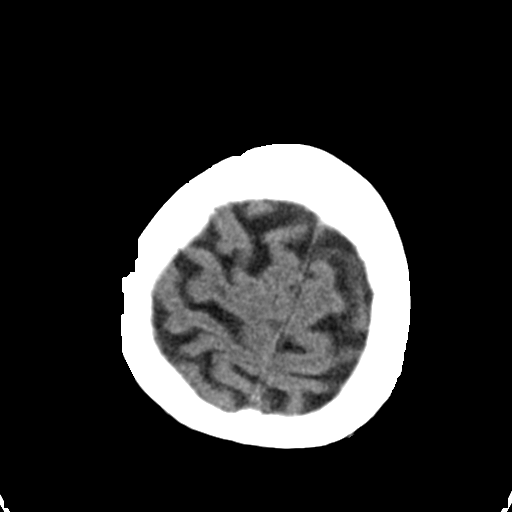
[im 25/30  bone]
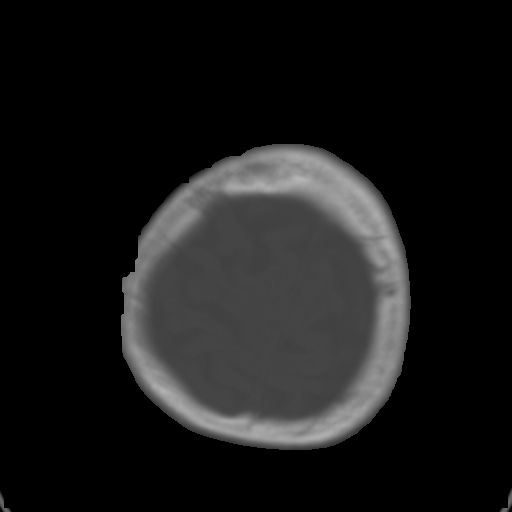
[im 26/30  brain]
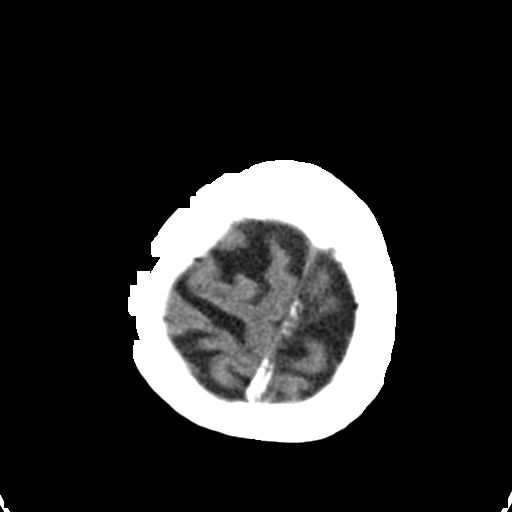
[im 28/30  brain]
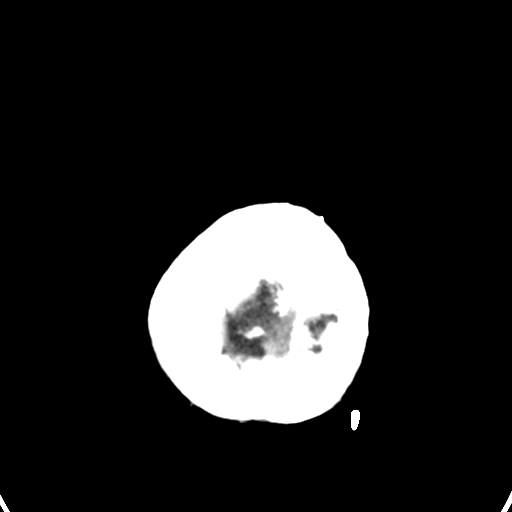

[15 of 30 positions shown; findings below may reference images not displayed]

FINDINGS: No acute intracranial hemorrhage, acute infarction, mass
lesion, mass effect, midline shift or hydrocephalus.  Gray-white
differentiation is preserved throughout.  Stable appearance of
encephalomalacia throughout the left MCA territory consistent with
remote infarct. Focal encephalomalacia in the left occipital lobe
in the PCA territory is also unchanged consistent with remote prior
infarct.  Stable global cerebral and cerebellar atrophy.  Bibasilar
calcifications again noted incidentally.  Globes and orbits are
intact and symmetric bilaterally.  No focal soft tissue or
calvarial abnormality.  Atherosclerotic calcifications noted in the
cavernous carotid arteries bilaterally.
IMPRESSION: 1.  No acute intracranial abnormality.

2.  Stable appearance of the brain.
